# Patient Record
Sex: Male | Born: 1940
Health system: Southern US, Community
[De-identification: ages and names within clinical notes are randomized; demographics above are authoritative.]

## PROBLEM LIST (undated history)

## (undated) DIAGNOSIS — E079 Disorder of thyroid, unspecified: Secondary | ICD-10-CM

## (undated) DIAGNOSIS — Z974 Presence of external hearing-aid: Secondary | ICD-10-CM

## (undated) DIAGNOSIS — Z8709 Personal history of other diseases of the respiratory system: Secondary | ICD-10-CM

## (undated) DIAGNOSIS — E039 Hypothyroidism, unspecified: Secondary | ICD-10-CM

## (undated) DIAGNOSIS — I10 Essential (primary) hypertension: Secondary | ICD-10-CM

## (undated) DIAGNOSIS — I341 Nonrheumatic mitral (valve) prolapse: Secondary | ICD-10-CM

## (undated) DIAGNOSIS — J45909 Unspecified asthma, uncomplicated: Secondary | ICD-10-CM

## (undated) DIAGNOSIS — T7840XA Allergy, unspecified, initial encounter: Secondary | ICD-10-CM

## (undated) DIAGNOSIS — Z87438 Personal history of other diseases of male genital organs: Secondary | ICD-10-CM

## (undated) DIAGNOSIS — K432 Incisional hernia without obstruction or gangrene: Secondary | ICD-10-CM

## (undated) DIAGNOSIS — J449 Chronic obstructive pulmonary disease, unspecified: Secondary | ICD-10-CM

## (undated) DIAGNOSIS — H269 Unspecified cataract: Secondary | ICD-10-CM

## (undated) DIAGNOSIS — E785 Hyperlipidemia, unspecified: Secondary | ICD-10-CM

## (undated) DIAGNOSIS — K635 Polyp of colon: Secondary | ICD-10-CM

## (undated) DIAGNOSIS — R011 Cardiac murmur, unspecified: Secondary | ICD-10-CM

## (undated) DIAGNOSIS — I499 Cardiac arrhythmia, unspecified: Secondary | ICD-10-CM

## (undated) DIAGNOSIS — K5790 Diverticulosis of intestine, part unspecified, without perforation or abscess without bleeding: Secondary | ICD-10-CM

## (undated) DIAGNOSIS — K5720 Diverticulitis of large intestine with perforation and abscess without bleeding: Secondary | ICD-10-CM

## (undated) HISTORY — DX: Hyperlipidemia, unspecified: E78.5

## (undated) HISTORY — DX: Nonrheumatic mitral (valve) prolapse: I34.1

## (undated) HISTORY — DX: Chronic obstructive pulmonary disease, unspecified: J44.9

## (undated) HISTORY — DX: Unspecified cataract: H26.9

## (undated) HISTORY — DX: Unspecified asthma, uncomplicated: J45.909

## (undated) HISTORY — PX: COLONOSCOPY: SHX174

## (undated) HISTORY — DX: Incisional hernia without obstruction or gangrene: K43.2

## (undated) HISTORY — DX: Hemochromatosis, unspecified: E83.119

## (undated) HISTORY — DX: Personal history of other diseases of the respiratory system: Z87.09

## (undated) HISTORY — DX: Polyp of colon: K63.5

## (undated) HISTORY — DX: Disorder of thyroid, unspecified: E07.9

## (undated) HISTORY — DX: Allergy, unspecified, initial encounter: T78.40XA

## (undated) HISTORY — DX: Diverticulosis of intestine, part unspecified, without perforation or abscess without bleeding: K57.90

## (undated) HISTORY — DX: Personal history of other diseases of male genital organs: Z87.438

## (undated) HISTORY — PX: UPPER GI ENDOSCOPY: SHX6162

## (undated) HISTORY — DX: Diverticulitis of large intestine with perforation and abscess without bleeding: K57.20

---

## 1997-01-26 HISTORY — PX: INGUINAL HERNIA REPAIR: SHX194

## 2004-02-15 ENCOUNTER — Ambulatory Visit: Payer: Self-pay | Admitting: Hematology & Oncology

## 2004-08-26 ENCOUNTER — Ambulatory Visit: Payer: Self-pay | Admitting: Hematology & Oncology

## 2005-01-01 ENCOUNTER — Ambulatory Visit: Payer: Self-pay | Admitting: Hematology & Oncology

## 2005-07-14 ENCOUNTER — Ambulatory Visit: Payer: Self-pay | Admitting: Hematology & Oncology

## 2005-07-16 LAB — FERRITIN: Ferritin: 34 ng/mL (ref 22–322)

## 2006-01-11 ENCOUNTER — Ambulatory Visit: Payer: Self-pay | Admitting: Hematology & Oncology

## 2006-01-14 LAB — CBC WITH DIFFERENTIAL/PLATELET
Basophils Absolute: 0 10*3/uL (ref 0.0–0.1)
Eosinophils Absolute: 0.2 10*3/uL (ref 0.0–0.5)
HGB: 15.2 g/dL (ref 13.0–17.1)
MCV: 92.9 fL (ref 81.6–98.0)
MONO#: 0.8 10*3/uL (ref 0.1–0.9)
NEUT#: 3 10*3/uL (ref 1.5–6.5)
RDW: 16.2 % — ABNORMAL HIGH (ref 11.2–14.6)
WBC: 4.9 10*3/uL (ref 4.0–10.0)
lymph#: 1 10*3/uL (ref 0.9–3.3)

## 2006-07-12 ENCOUNTER — Ambulatory Visit: Payer: Self-pay | Admitting: Hematology & Oncology

## 2006-07-14 LAB — FERRITIN: Ferritin: 43 ng/mL (ref 22–322)

## 2006-10-13 ENCOUNTER — Ambulatory Visit: Payer: Self-pay | Admitting: Internal Medicine

## 2006-10-22 ENCOUNTER — Ambulatory Visit: Payer: Self-pay | Admitting: Internal Medicine

## 2006-10-22 ENCOUNTER — Encounter: Payer: Self-pay | Admitting: Internal Medicine

## 2007-01-10 ENCOUNTER — Ambulatory Visit: Payer: Self-pay | Admitting: Hematology & Oncology

## 2007-01-12 LAB — CBC WITH DIFFERENTIAL/PLATELET
BASO%: 0.5 % (ref 0.0–2.0)
EOS%: 2.3 % (ref 0.0–7.0)
MCH: 33.5 pg — ABNORMAL HIGH (ref 28.0–33.4)
MCHC: 35.4 g/dL (ref 32.0–35.9)
NEUT%: 57 % (ref 40.0–75.0)
RDW: 12.4 % (ref 11.2–14.6)
lymph#: 1.3 10*3/uL (ref 0.9–3.3)

## 2007-01-12 LAB — IRON AND TIBC: TIBC: 249 ug/dL (ref 215–435)

## 2007-07-11 ENCOUNTER — Ambulatory Visit: Payer: Self-pay | Admitting: Hematology & Oncology

## 2008-01-04 ENCOUNTER — Ambulatory Visit: Payer: Self-pay | Admitting: Hematology & Oncology

## 2008-01-11 LAB — COMPREHENSIVE METABOLIC PANEL
ALT: 19 U/L (ref 0–53)
Albumin: 4.4 g/dL (ref 3.5–5.2)
CO2: 26 mEq/L (ref 19–32)
Calcium: 9.6 mg/dL (ref 8.4–10.5)
Chloride: 104 mEq/L (ref 96–112)
Creatinine, Ser: 1 mg/dL (ref 0.40–1.50)
Sodium: 140 mEq/L (ref 135–145)
Total Protein: 7.7 g/dL (ref 6.0–8.3)

## 2008-01-11 LAB — CBC WITH DIFFERENTIAL (CANCER CENTER ONLY)
BASO%: 0.6 % (ref 0.0–2.0)
EOS%: 3.3 % (ref 0.0–7.0)
LYMPH%: 33.6 % (ref 14.0–48.0)
MCH: 32.7 pg (ref 28.0–33.4)
MCHC: 34 g/dL (ref 32.0–35.9)
MCV: 96 fL (ref 82–98)
MONO%: 7.4 % (ref 0.0–13.0)
Platelets: 252 10*3/uL (ref 145–400)
RDW: 11.3 % (ref 10.5–14.6)
WBC: 4 10*3/uL (ref 4.0–10.0)

## 2008-01-11 LAB — CHCC SATELLITE - SMEAR

## 2008-01-11 LAB — AFP TUMOR MARKER: AFP-Tumor Marker: 1.8 ng/mL (ref 0.0–8.0)

## 2008-01-27 DIAGNOSIS — K5720 Diverticulitis of large intestine with perforation and abscess without bleeding: Secondary | ICD-10-CM

## 2008-01-27 HISTORY — DX: Diverticulitis of large intestine with perforation and abscess without bleeding: K57.20

## 2008-01-27 HISTORY — PX: COLOSTOMY TAKEDOWN: SHX5783

## 2008-07-10 ENCOUNTER — Ambulatory Visit: Payer: Self-pay | Admitting: Hematology & Oncology

## 2008-07-11 LAB — CBC WITH DIFFERENTIAL (CANCER CENTER ONLY)
BASO#: 0 10*3/uL (ref 0.0–0.2)
Eosinophils Absolute: 0.1 10*3/uL (ref 0.0–0.5)
LYMPH%: 28.6 % (ref 14.0–48.0)
MCH: 27.1 pg — ABNORMAL LOW (ref 28.0–33.4)
MCV: 84 fL (ref 82–98)
MONO%: 7.5 % (ref 0.0–13.0)
NEUT%: 60.4 % (ref 40.0–80.0)
Platelets: 271 10*3/uL (ref 145–400)
RBC: 4.36 10*6/uL (ref 4.20–5.70)

## 2008-08-12 ENCOUNTER — Encounter (INDEPENDENT_AMBULATORY_CARE_PROVIDER_SITE_OTHER): Payer: Self-pay | Admitting: General Surgery

## 2008-08-12 ENCOUNTER — Inpatient Hospital Stay (HOSPITAL_COMMUNITY): Admission: EM | Admit: 2008-08-12 | Discharge: 2008-08-16 | Payer: Self-pay | Admitting: Orthopaedic Surgery

## 2008-08-12 HISTORY — PX: PARTIAL COLECTOMY: SHX5273

## 2008-08-18 ENCOUNTER — Inpatient Hospital Stay (HOSPITAL_COMMUNITY): Admission: EM | Admit: 2008-08-18 | Discharge: 2008-08-24 | Payer: Self-pay | Admitting: Emergency Medicine

## 2008-10-23 ENCOUNTER — Encounter (INDEPENDENT_AMBULATORY_CARE_PROVIDER_SITE_OTHER): Payer: Self-pay | Admitting: General Surgery

## 2008-10-23 ENCOUNTER — Inpatient Hospital Stay (HOSPITAL_COMMUNITY): Admission: RE | Admit: 2008-10-23 | Discharge: 2008-10-29 | Payer: Self-pay | Admitting: General Surgery

## 2009-01-26 HISTORY — PX: COLONOSCOPY W/ BIOPSIES: SHX1374

## 2009-07-08 ENCOUNTER — Ambulatory Visit: Payer: Self-pay | Admitting: Hematology & Oncology

## 2009-07-10 LAB — FERRITIN: Ferritin: 15 ng/mL — ABNORMAL LOW (ref 22–322)

## 2009-07-10 LAB — CBC WITH DIFFERENTIAL (CANCER CENTER ONLY)
BASO#: 0 10*3/uL (ref 0.0–0.2)
Eosinophils Absolute: 0.2 10*3/uL (ref 0.0–0.5)
HGB: 12.1 g/dL — ABNORMAL LOW (ref 13.0–17.1)
LYMPH#: 1.1 10*3/uL (ref 0.9–3.3)
MCH: 27.5 pg — ABNORMAL LOW (ref 28.0–33.4)
MONO%: 7.6 % (ref 0.0–13.0)
NEUT#: 3.1 10*3/uL (ref 1.5–6.5)
RBC: 4.41 10*6/uL (ref 4.20–5.70)

## 2009-09-20 ENCOUNTER — Encounter: Payer: Self-pay | Admitting: Internal Medicine

## 2009-10-30 ENCOUNTER — Encounter (INDEPENDENT_AMBULATORY_CARE_PROVIDER_SITE_OTHER): Payer: Self-pay | Admitting: *Deleted

## 2009-11-21 ENCOUNTER — Encounter (INDEPENDENT_AMBULATORY_CARE_PROVIDER_SITE_OTHER): Payer: Self-pay | Admitting: *Deleted

## 2009-11-25 ENCOUNTER — Ambulatory Visit: Payer: Self-pay | Admitting: Internal Medicine

## 2009-12-09 ENCOUNTER — Ambulatory Visit: Payer: Self-pay | Admitting: Internal Medicine

## 2009-12-11 ENCOUNTER — Encounter: Payer: Self-pay | Admitting: Internal Medicine

## 2010-02-16 ENCOUNTER — Encounter: Payer: Self-pay | Admitting: Endocrinology

## 2010-02-27 NOTE — Letter (Signed)
Summary: Colonoscopy Letter  [facility]  [address] [place], [place] [address]   Phone: [phone]  Fax: [phone]      September 20, 2009 MRN: [id number]   [facility] [address] [place], [place]  [address]   Dear Mr. Nery,   According to your medical record, it is time for you to schedule a Colonoscopy. The American Cancer Society recommends this procedure as a method to detect early colon cancer. Patients with a family history of colon cancer, or a personal history of colon polyps or inflammatory bowel disease are at increased risk.  This letter has been generated based on the recommendations made at the time of your procedure. If you feel that in your particular situation this may no longer apply, please contact our office.  Please call our office at [phone] to schedule this appointment or to update your records at your earliest convenience.  Thank you for cooperating with [place] to provide you with the very best care possible.   Sincerely,  Wilhemina Bonito. Marina Goodell, M.D.  [facility] Gastroenterology Division [phone]

## 2010-02-27 NOTE — Letter (Signed)
Summary: Patient Notice- Colon Biospy Results   Gastroenterology  [address] [place], [place] [address]   Phone: [phone]  Fax: [phone]        December 11, 2009 MRN: [id number]    [facility] [address] [place], [place]  [address]    Dear Mr. Bredeson,  I am pleased to inform you that the biopsies taken during your recent colonoscopy showed only benign inflammation near your anastomosis (as expected).  Additional information/recommendations:  __No further action is needed at this time.  Please follow-up with      your primary care physician for your other healthcare needs.   __You should have a repeat colonoscopy examination  in 5 years.    Please call us if you are having persistent problems or have questions about your condition that have not been fully answered at this time.  Sincerely,  Hilarie Fredrickson MD   This letter has been electronically signed by your physician.  Appended Document: Patient Notice- Colon Biospy Results Letter mailed

## 2010-02-27 NOTE — Procedures (Signed)
Summary: Colonoscopy  Patient: Preston Reeves Note: All result statuses are Final unless otherwise noted.  Tests: (1) Colonoscopy (COL)   COL Colonoscopy           DONE      [facility]     [address]     [place], [place]  [address]           COLONOSCOPY PROCEDURE REPORT           PATIENT:  Preston Reeves, Preston Reeves  MR#:  [id number]     BIRTHDATE:  [birth year], 69 yrs. old  GENDER:  male     ENDOSCOPIST:  Wilhemina Bonito. Eda Keys, MD     REF. BY:  Surveillance Program Recall,     PROCEDURE DATE:  12/09/2009     PROCEDURE:  Colonoscopy with biopsies     ASA CLASS:  Class II     INDICATIONS:  history of pre-cancerous (adenomatous) colon polyps,     family history of colon cancer (parent 66), surveillance and     high-risk screening ; index exam 2003 w/ f/u 2005 and 2008;     segmental colonic resection for complicated diverticular disease     since last exam     MEDICATIONS:   Fentanyl 50 mcg IV, Versed 8 mg IV           DESCRIPTION OF PROCEDURE:   After the risks benefits and     alternatives of the procedure were thoroughly explained, informed     consent was obtained.  Digital rectal exam was performed and     revealed no abnormalities.   The [id number] endoscope was     introduced through the anus and advanced to the cecum, which was     identified by both the appendix and ileocecal valve, without     limitations.Time to cecum = 3:42 min.  The quality of the prep was     excellent, using MoviPrep.  The instrument was then slowly     withdrawn (time = 12:46 min) as the colon was fully examined.     <<PROCEDUREIMAGES>>           FINDINGS:  There was evidence of a prior segmental colectomy in     the sigmoid colon.  There was a surgical anastomosis in the     sigmoid colon at 20cm. One area had nospecific tissue irregularity     (see image #4 above) which was biopsied to r/o neoplastic changes.     This was otherwise a normal examination of the colon.  No obvious     polyps or  cancers were seen.   Retroflexed views in the rectum     revealed no abnormalities.    The scope was then withdrawn from     the patient and the procedure completed.           COMPLICATIONS:  None           ENDOSCOPIC IMPRESSION:     1) Prior segmental colectomy in the sigmoid colon     2) Anastomosis in the sigmoid colon with mild tissue irreglarity     s/p bx     3) Otherwise normal examination of the colon     4) No obvious polyps or cancers           RECOMMENDATIONS:     1) Await biopsy results     2) Follow up colonoscopy in 5 years     ______________________________  Wilhemina Bonito. Eda Keys, MD           CC:  Cassell Clement, MD; The Patient           n.     eSIGNED:   Wilhemina Bonito. Eda Keys at 12/09/2009 03:21 PM           [name], [id number]  Note: An exclamation mark (!) indicates a result that was not dispersed into the flowsheet. Document Creation Date: 12/09/2009 3:22 PM _______________________________________________________________________  (1) Order result status: Final Collection or observation date-time: 12/09/2009 15:11 Requested date-time:  Receipt date-time:  Reported date-time:  Referring Physician:   Ordering Physician: Fransico Setters [phone]) Specimen Source:  Source: Launa Grill Order Number: [id number] Lab site:   Appended Document: Colonoscopy recall

## 2010-02-27 NOTE — Letter (Signed)
Summary: [facility] Instructions  [facility]  [address] [place], [place] [address]   Phone: [phone]  Fax: [phone]       Preston Reeves    [birth year]    MRN: [id number]        Procedure Day /Date:  Monday 12/09/2009     Arrival Time:  1:30 pm     Procedure Time:  2:30 pm     Location of Procedure:                    _x _  [facility] (4th Floor)                        PREPARATION FOR COLONOSCOPY WITH MOVIPREP   Starting 5 days prior to your procedure Wednesday 11/9 do not eat nuts, seeds, popcorn, corn, beans, peas,  salads, or any raw vegetables.  Do not take any fiber supplements (e.g. Metamucil, Citrucel, and Benefiber).  THE DAY BEFORE YOUR PROCEDURE         DATE: Sunday 11/13  1.  Drink clear liquids the entire day-NO SOLID FOOD  2.  Do not drink anything colored red or purple.  Avoid juices with pulp.  No orange juice.  3.  Drink at least 64 oz. (8 glasses) of fluid/clear liquids during the day to prevent dehydration and help the prep work efficiently.  CLEAR LIQUIDS INCLUDE: Water Jello Ice Popsicles Tea (sugar ok, no milk/cream) Powdered fruit flavored drinks Coffee (sugar ok, no milk/cream) Gatorade Juice: apple, white grape, white cranberry  Lemonade Clear bullion, consomm, broth Carbonated beverages (any kind) Strained chicken noodle soup Hard Candy                             4.  In the morning, mix first dose of MoviPrep solution:    Empty 1 Pouch A and 1 Pouch B into the disposable container    Add lukewarm drinking water to the top line of the container. Mix to dissolve    Refrigerate (mixed solution should be used within 24 hrs)  5.  Begin drinking the prep at 5:00 p.m. The MoviPrep container is divided by 4 marks.   Every 15 minutes drink the solution down to the next mark (approximately 8 oz) until the full liter is complete.   6.  Follow completed prep with 16 oz of clear liquid of your choice  (Nothing red or purple).  Continue to drink clear liquids until bedtime.  7.  Before going to bed, mix second dose of MoviPrep solution:    Empty 1 Pouch A and 1 Pouch B into the disposable container    Add lukewarm drinking water to the top line of the container. Mix to dissolve    Refrigerate  THE DAY OF YOUR PROCEDURE      DATE: Monday 11/14  Beginning at 9:30 a.m. (5 hours before procedure):         1. Every 15 minutes, drink the solution down to the next mark (approx 8 oz) until the full liter is complete.  2. Follow completed prep with 16 oz. of clear liquid of your choice.    3. You may drink clear liquids until 12:30 pm (2 HOURS BEFORE PROCEDURE).   MEDICATION INSTRUCTIONS  Unless otherwise instructed, you should take regular prescription medications with a small sip of water   as early as possible the morning  of your procedure.          OTHER INSTRUCTIONS  You will need a responsible adult at least 70 years of age to accompany you and drive you home.   This person must remain in the waiting room during your procedure.  Wear loose fitting clothing that is easily removed.  Leave jewelry and other valuables at home.  However, you may wish to bring a book to read or  an iPod/MP3 player to listen to music as you wait for your procedure to start.  Remove all body piercing jewelry and leave at home.  Total time from sign-in until discharge is approximately 2-3 hours.  You should go home directly after your procedure and rest.  You can resume normal activities the  day after your procedure.  The day of your procedure you should not:   Drive   Make legal decisions   Operate machinery   Drink alcohol   Return to work  You will receive specific instructions about eating, activities and medications before you leave.    The above instructions have been reviewed and explained to me by   Ezra Sites RN  November 25, 2009 11:13 AM  I fully understand and can  verbalize these instructions _____________________________ Date _________

## 2010-02-27 NOTE — Letter (Signed)
Summary: Colonoscopy Letter  [facility]  [address]   [place], [place] [address]   Phone: [phone]  Fax: [phone]      September 20, 2009 MRN: [id number]   [facility] [address] [place], [place]  [address]   Dear Mr. Covington,   According to your medical record, it is time for you to schedule a Colonoscopy. The American Cancer Society recommends this procedure as a method to detect early colon cancer. Patients with a family history of colon cancer, or a personal history of colon polyps or inflammatory bowel disease are at increased risk.  This letter has been generated based on the recommendations made at the time of your procedure. If you feel that in your particular situation this may no longer apply, please contact our office.  Please call our office at [phone] to schedule this appointment or to update your records at your earliest convenience.  Thank you for cooperating with us to provide you with the very best care possible.   Sincerely,  John N. Perry, M.D.  [facility] Gastroenterology Division [phone] 

## 2010-02-27 NOTE — Miscellaneous (Signed)
Summary: [facility]  Clinical Lists Changes  Medications: Added new medication of MOVIPREP 100 GM  SOLR (PEG-KCL-NACL-NASULF-NA ASC-C) As per prep instructions. - Signed Rx of MOVIPREP 100 GM  SOLR (PEG-KCL-NACL-NASULF-NA ASC-C) As per prep instructions.;  #1 x 0;  Signed;  Entered by: Ezra Sites RN;  Authorized by: Hilarie Fredrickson MD;  Method used: Electronically to [facility]. [phone]*, [address], [place], [place]  [address], Ph: [phone], Fax: [phone] Observations: Added new observation of NKA: T (11/25/2009 10:52)    Prescriptions: MOVIPREP 100 GM  SOLR (PEG-KCL-NACL-NASULF-NA ASC-C) As per prep instructions.  #1 x 0   Entered by:   Ezra Sites RN   Authorized by:   Hilarie Fredrickson MD   Signed by:   Ezra Sites RN on 11/25/2009   Method used:   Electronically to        [facility] [address] [phone]* (retail)       [address] [place], [place]  [address]       Ph: [phone]       Fax: [phone]   RxID:   [id number]

## 2010-02-27 NOTE — Letter (Signed)
Summary: Pre Visit Letter Revised  [facility]  [address] [place], [place] [address]   Phone: [phone]  Fax: [phone]        10/30/2009 MRN: [id number] [facility] [address] [place], [place]  [address]             Procedure Date:  12/09/2009   Welcome to the Gastroenterology Division at [facility].    You are scheduled to see a nurse for your pre-procedure visit on 11/25/2009 at 11:00am on the 3rd floor at [facility], [address]  We ask that you try to arrive at our office 15 minutes prior to your appointment time to allow for check-in.  Please take a minute to review the attached form.  If you answer "Yes" to one or more of the questions on the first page, we ask that you call the person listed at your earliest opportunity.  If you answer "No" to all of the questions, please complete the rest of the form and bring it to your appointment.    Your nurse visit will consist of discussing your medical and surgical history, your immediate family medical history, and your medications.   If you are unable to list all of your medications on the form, please bring the medication bottles to your appointment and we will list them.  We will need to be aware of both prescribed and over the counter drugs.  We will need to know exact dosage information as well.    Please be prepared to read and sign documents such as consent forms, a financial agreement, and acknowledgement forms.  If necessary, and with your consent, a friend or relative is welcome to sit-in on the nurse visit with you.  Please bring your insurance card so that we may make a copy of it.  If your insurance requires a referral to see a specialist, please bring your referral form from your primary care physician.  No co-pay is required for this nurse visit.     If you cannot keep your appointment, please call [phone] to cancel or reschedule prior to your appointment date.  This  allows [place] the opportunity to schedule an appointment for another patient in need of care.    Thank you for choosing [facility] for your medical needs.  We appreciate the opportunity to care for you.  Please visit [place] at our website  to learn more about our practice.  Sincerely, The Gastroenterology Division

## 2010-05-01 LAB — CBC
HCT: 29.1 % — ABNORMAL LOW (ref 39.0–52.0)
Hemoglobin: 9.9 g/dL — ABNORMAL LOW (ref 13.0–17.0)
MCV: 84.9 fL (ref 78.0–100.0)
RBC: 3.42 MIL/uL — ABNORMAL LOW (ref 4.22–5.81)
WBC: 4.9 10*3/uL (ref 4.0–10.5)

## 2010-05-01 LAB — BASIC METABOLIC PANEL
Chloride: 102 mEq/L (ref 96–112)
GFR calc Af Amer: >60 mL/min (ref >60)
Potassium: 3.8 mEq/L (ref 3.5–5.1)

## 2010-05-02 LAB — TYPE AND SCREEN: Antibody Screen: NEGATIVE

## 2010-05-02 LAB — BASIC METABOLIC PANEL
BUN: 15 mg/dL (ref 6–23)
CO2: 29 mEq/L (ref 19–32)
Calcium: 8.8 mg/dL (ref 8.4–10.5)
Chloride: 103 mEq/L (ref 96–112)
Chloride: 105 mEq/L (ref 96–112)
GFR calc Af Amer: >60 mL/min (ref >60)
GFR calc Af Amer: >60 mL/min (ref >60)
GFR calc non Af Amer: >60 mL/min (ref >60)
Glucose, Bld: 105 mg/dL — ABNORMAL HIGH (ref 70–99)
Glucose, Bld: 117 mg/dL — ABNORMAL HIGH (ref 70–99)
Potassium: 3.8 mEq/L (ref 3.5–5.1)
Potassium: 4 mEq/L (ref 3.5–5.1)
Potassium: 4.1 mEq/L (ref 3.5–5.1)
Sodium: 135 mEq/L (ref 135–145)
Sodium: 136 mEq/L (ref 135–145)

## 2010-05-02 LAB — DIFFERENTIAL
Eosinophils Absolute: 0.2 10*3/uL (ref 0.0–0.7)
Eosinophils Relative: 4 % (ref 0–5)
Lymphs Abs: 1.1 10*3/uL (ref 0.7–4.0)
Monocytes Absolute: 0.6 10*3/uL (ref 0.1–1.0)

## 2010-05-02 LAB — URINALYSIS, ROUTINE W REFLEX MICROSCOPIC
Bilirubin Urine: NEGATIVE
Glucose, UA: NEGATIVE mg/dL
Ketones, ur: NEGATIVE mg/dL
pH: 5.5 (ref 5.0–8.0)

## 2010-05-02 LAB — PHOSPHORUS: Phosphorus: 4 mg/dL (ref 2.3–4.6)

## 2010-05-02 LAB — CBC
HCT: 31.8 % — ABNORMAL LOW (ref 39.0–52.0)
HCT: 34.9 % — ABNORMAL LOW (ref 39.0–52.0)
Hemoglobin: 10.7 g/dL — ABNORMAL LOW (ref 13.0–17.0)
Hemoglobin: 11.5 g/dL — ABNORMAL LOW (ref 13.0–17.0)
Hemoglobin: 9.7 g/dL — ABNORMAL LOW (ref 13.0–17.0)
MCHC: 33.9 g/dL (ref 30.0–36.0)
MCV: 83.7 fL (ref 78.0–100.0)
MCV: 85 fL (ref 78.0–100.0)
Platelets: 253 10*3/uL (ref 150–400)
RBC: 3.36 MIL/uL — ABNORMAL LOW (ref 4.22–5.81)
RBC: 3.76 MIL/uL — ABNORMAL LOW (ref 4.22–5.81)
WBC: 4.7 10*3/uL (ref 4.0–10.5)
WBC: 5.6 10*3/uL (ref 4.0–10.5)
WBC: 8.1 10*3/uL (ref 4.0–10.5)

## 2010-05-02 LAB — PROTIME-INR: Prothrombin Time: 13.6 seconds (ref 11.6–15.2)

## 2010-05-04 LAB — BASIC METABOLIC PANEL
BUN: 11 mg/dL (ref 6–23)
CO2: 25 mEq/L (ref 19–32)
CO2: 28 mEq/L (ref 19–32)
CO2: 28 mEq/L (ref 19–32)
CO2: 31 mEq/L (ref 19–32)
Calcium: 8 mg/dL — ABNORMAL LOW (ref 8.4–10.5)
Calcium: 8.2 mg/dL — ABNORMAL LOW (ref 8.4–10.5)
Chloride: 101 mEq/L (ref 96–112)
Chloride: 104 mEq/L (ref 96–112)
Chloride: 106 mEq/L (ref 96–112)
Chloride: 99 mEq/L (ref 96–112)
Creatinine, Ser: 1.01 mg/dL (ref 0.4–1.5)
GFR calc Af Amer: >60 mL/min (ref >60)
GFR calc Af Amer: >60 mL/min (ref >60)
GFR calc Af Amer: >60 mL/min (ref >60)
Glucose, Bld: 106 mg/dL — ABNORMAL HIGH (ref 70–99)
Glucose, Bld: 119 mg/dL — ABNORMAL HIGH (ref 70–99)
Potassium: 3.5 mEq/L (ref 3.5–5.1)
Potassium: 3.6 mEq/L (ref 3.5–5.1)
Potassium: 3.9 mEq/L (ref 3.5–5.1)
Sodium: 133 mEq/L — ABNORMAL LOW (ref 135–145)
Sodium: 134 mEq/L — ABNORMAL LOW (ref 135–145)
Sodium: 136 mEq/L (ref 135–145)

## 2010-05-04 LAB — DIFFERENTIAL
Basophils Absolute: 0 10*3/uL (ref 0.0–0.1)
Basophils Absolute: 0 10*3/uL (ref 0.0–0.1)
Eosinophils Absolute: 0 10*3/uL (ref 0.0–0.7)
Eosinophils Relative: 0 % (ref 0–5)
Eosinophils Relative: 0 % (ref 0–5)
Lymphocytes Relative: 2 % — ABNORMAL LOW (ref 12–46)
Lymphocytes Relative: 4 % — ABNORMAL LOW (ref 12–46)
Lymphs Abs: 0.4 10*3/uL — ABNORMAL LOW (ref 0.7–4.0)
Neutro Abs: 7.7 10*3/uL (ref 1.7–7.7)
Neutrophils Relative %: 90 % — ABNORMAL HIGH (ref 43–77)
Neutrophils Relative %: 95 % — ABNORMAL HIGH (ref 43–77)

## 2010-05-04 LAB — CBC
HCT: 28.5 % — ABNORMAL LOW (ref 39.0–52.0)
HCT: 32.3 % — ABNORMAL LOW (ref 39.0–52.0)
HCT: 33.5 % — ABNORMAL LOW (ref 39.0–52.0)
HCT: 35.4 % — ABNORMAL LOW (ref 39.0–52.0)
Hemoglobin: 10.1 g/dL — ABNORMAL LOW (ref 13.0–17.0)
Hemoglobin: 10.3 g/dL — ABNORMAL LOW (ref 13.0–17.0)
Hemoglobin: 10.5 g/dL — ABNORMAL LOW (ref 13.0–17.0)
Hemoglobin: 11.6 g/dL — ABNORMAL LOW (ref 13.0–17.0)
Hemoglobin: 9.5 g/dL — ABNORMAL LOW (ref 13.0–17.0)
MCHC: 32.5 g/dL (ref 30.0–36.0)
MCHC: 32.7 g/dL (ref 30.0–36.0)
MCHC: 32.8 g/dL (ref 30.0–36.0)
MCHC: 33 g/dL (ref 30.0–36.0)
MCHC: 33 g/dL (ref 30.0–36.0)
MCV: 81.9 fL (ref 78.0–100.0)
MCV: 82.1 fL (ref 78.0–100.0)
MCV: 82.4 fL (ref 78.0–100.0)
MCV: 82.8 fL (ref 78.0–100.0)
RBC: 3.44 MIL/uL — ABNORMAL LOW (ref 4.22–5.81)
RBC: 3.68 MIL/uL — ABNORMAL LOW (ref 4.22–5.81)
RBC: 3.78 MIL/uL — ABNORMAL LOW (ref 4.22–5.81)
RBC: 3.92 MIL/uL — ABNORMAL LOW (ref 4.22–5.81)
RBC: 4.09 MIL/uL — ABNORMAL LOW (ref 4.22–5.81)
RBC: 4.28 MIL/uL (ref 4.22–5.81)
RDW: 17.2 % — ABNORMAL HIGH (ref 11.5–15.5)
RDW: 17.3 % — ABNORMAL HIGH (ref 11.5–15.5)
WBC: 7.8 10*3/uL (ref 4.0–10.5)
WBC: 8.2 10*3/uL (ref 4.0–10.5)

## 2010-05-04 LAB — LIPASE, BLOOD: Lipase: 17 U/L (ref 11–59)

## 2010-05-04 LAB — CROSSMATCH: Antibody Screen: NEGATIVE

## 2010-05-04 LAB — URINALYSIS, ROUTINE W REFLEX MICROSCOPIC
Ketones, ur: >80 mg/dL — AB
Leukocytes, UA: NEGATIVE
Nitrite: NEGATIVE
Nitrite: NEGATIVE
Protein, ur: NEGATIVE mg/dL
Specific Gravity, Urine: 1.026 (ref 1.005–1.030)
Urobilinogen, UA: 0.2 mg/dL (ref 0.0–1.0)
Urobilinogen, UA: 0.2 mg/dL (ref 0.0–1.0)
pH: 5.5 (ref 5.0–8.0)

## 2010-05-04 LAB — COMPREHENSIVE METABOLIC PANEL
ALT: 19 U/L (ref 0–53)
AST: 62 U/L — ABNORMAL HIGH (ref 0–37)
Alkaline Phosphatase: 50 U/L (ref 39–117)
BUN: 13 mg/dL (ref 6–23)
BUN: 21 mg/dL (ref 6–23)
CO2: 23 mEq/L (ref 19–32)
CO2: 26 mEq/L (ref 19–32)
Calcium: 8.4 mg/dL (ref 8.4–10.5)
Calcium: 9 mg/dL (ref 8.4–10.5)
Creatinine, Ser: 0.84 mg/dL (ref 0.4–1.5)
GFR calc Af Amer: >60 mL/min (ref >60)
GFR calc non Af Amer: >60 mL/min (ref >60)
GFR calc non Af Amer: >60 mL/min (ref >60)
Glucose, Bld: 131 mg/dL — ABNORMAL HIGH (ref 70–99)
Potassium: 3.6 mEq/L (ref 3.5–5.1)
Sodium: 136 mEq/L (ref 135–145)
Total Bilirubin: 0.7 mg/dL (ref 0.3–1.2)
Total Protein: 7.3 g/dL (ref 6.0–8.3)

## 2010-05-04 LAB — ANAEROBIC CULTURE

## 2010-05-04 LAB — MAGNESIUM: Magnesium: 2.3 mg/dL (ref 1.5–2.5)

## 2010-05-04 LAB — URINE MICROSCOPIC-ADD ON

## 2010-05-04 LAB — PHOSPHORUS
Phosphorus: 1.7 mg/dL — ABNORMAL LOW (ref 2.3–4.6)
Phosphorus: 2.5 mg/dL (ref 2.3–4.6)
Phosphorus: 2.8 mg/dL (ref 2.3–4.6)

## 2010-05-04 LAB — WOUND CULTURE

## 2010-05-13 ENCOUNTER — Other Ambulatory Visit: Payer: Self-pay | Admitting: *Deleted

## 2010-05-14 ENCOUNTER — Ambulatory Visit: Payer: Self-pay | Admitting: Cardiology

## 2010-05-26 ENCOUNTER — Other Ambulatory Visit: Payer: Self-pay | Admitting: *Deleted

## 2010-05-26 DIAGNOSIS — E7800 Pure hypercholesterolemia, unspecified: Secondary | ICD-10-CM

## 2010-05-26 DIAGNOSIS — Z125 Encounter for screening for malignant neoplasm of prostate: Secondary | ICD-10-CM

## 2010-05-26 DIAGNOSIS — Z79899 Other long term (current) drug therapy: Secondary | ICD-10-CM

## 2010-06-10 NOTE — Discharge Summary (Signed)
NAME:  Preston Reeves, Preston Reeves                ACCOUNT NO.:  [id number]   MEDICAL RECORD NO.:  [id number]          PATIENT TYPE:  INP   LOCATION:  1533                         FACILITY:  [facility]   PHYSICIAN:  Almond Lint, MD       DATE OF BIRTH:  [birth year]   DATE OF ADMISSION:  08/18/2008  DATE OF DISCHARGE:  08/24/2008                               DISCHARGE SUMMARY   FINAL DIAGNOSIS:  Diverticulitis.   SECONDARY DIAGNOSIS:  Wound infection and prolonged postoperative ileus.   PRINCIPAL PROCEDURES:  Opening of abdominal wound and CT scan consistent  with obstruction.   DISCHARGE MEDICATIONS:  Percocet 5/325 one to two p.o. q.4 h p.r.n.  pain, Claritin, multivitamin. 81 mg aspirin, B2, fish oil, vitamin C,  use Levoxyl 0.137 mcg a day, and I will be calling in him a prescription  for doxazosin for urinary retention.   HOSPITAL COURSE:  Mr. Goeden was readmitted to the hospital after being  discharged following a Hartmann's procedure for perforated  diverticulitis.  He had a wound infection, and his abdominal wound was  opened by Dr. Dwain Sarna.  He was going to undergo a CT scan in the  emergency department on the day of admission. However, he was unable to  tolerate contrast.  He required NG tube decompression for around 3-4  days.  His NG tube stopped putting out much, and he was placed to  gravity.  He continued, however, to have minimal output from his ostomy,  so a CT scan was performed.  This was suspicious for a partial  obstruction. However, the next morning he had opened up and blew out  ostomy bag because of the volume of stool and air that came through.  His diet was advanced and he did well.  He had been discharged on the  previous admission with a Foley catheter due to urinary retention.  However, this was removed during this hospitalization and he is able to  void without significant postvoid residuals.  He will go home in  improved condition.  He will have a wet-to-dry daily  and ostomy care  with home health nurse checking in on these.  Likely his wife will be  able to do these on her own and just need a little assistance.  Additionally he will follow up with me in 2 weeks.  There are no  significant labs from his hospitalization.      Almond Lint, MD  Electronically Signed     FB/MEDQ  D:  08/24/2008  T:  08/24/2008  Job:  [id number]

## 2010-06-10 NOTE — H&P (Signed)
NAME:  Preston Reeves, Preston Reeves                ACCOUNT NO.:  [id number]   MEDICAL RECORD NO.:  [id number]          PATIENT TYPE:  INP   LOCATION:  1501                         [facility]ACILITY[facility]   PHYSICIAN:  Almond Lint, MD       DATE OF BIRTH:  30-Oct-1940   DATE OF ADMISSION:  08/11/2008  DATE OF DISCHARGE:                              HISTORY & PHYSICAL   CHIEF COMPLAINT:  Abdominal pain.   HISTORY OF PRESENT ILLNESS:  Mr. Frangos is a 70 year old male who has  been having 12-24 hours of lower abdominal pain.  He originally thought  that this was an episode of gas or constipation and tried antacids and  Prilosec.  He had no relief of his symptoms.  They started to get worse.  He describes this as lower abdominal pain, but kind of came on and would  not let go as he described it.  He has had emesis, has been unable to  keep anything down.  He also developed fevers and chills.  He sought  help from the emergency department when I started to hurt even to  move.  He denies any radiation of the pain anywhere else.  The pain was  worse on the drive over here.  He has never had symptoms like this  before.  He denies diarrhea but has had a little bit of low grade  constipation.  He was able to play golf 2 days prior to admission and  shoot an 80.   PAST MEDICAL HISTORY:  Significant for hemochromatosis diagnosed with  blood work for an insurance physical, hypothyroidism and mitral valve  prolapse.  He has had a history of a left inguinal hernia repair.   FAMILY HISTORY:  Father with colon cancer.   SOCIAL HISTORY:  He is accompanied by his son and wife.  He does  occasionally have alcohol, does not use any other substances.   ALLERGIES:  None.   MEDICATIONS:  1. 162 mg of aspirin per day.  2. Calcium.  3. Claritin.  4. Levoxyl.  5. Multivitamin.  6. Vitamin C.  7. Fish Oil.  8. Vitamin B12.   REVIEW OF SYSTEMS:  Is otherwise negative 10-point review of systems as  in the HPI and the past  medical history.   PHYSICAL EXAMINATION:  Temperature 101.2, pulse 91, respiratory rate 20,  blood pressure 101/59.  GENERAL:  He is alert and oriented x3, no acute distress.  HEENT:  Normocephalic, atraumatic.  Sclerae are anicteric.  NECK:  Supple without lymphadenopathy.  HEART:  Regular.  LUNGS:  Clear.  ABDOMEN:  Soft, slightly distended with bilateral lower quadrant  tenderness worse in the right.  EXTREMITIES:  Warm and well perfused with no pitting edema.  SKIN:  Without rashes.   LABS AND X-RAYS:  Images and reports are reviewed.  He has a plain film  with nonspecific bowel gas pattern and CT scan demonstrating  inflammation in the right lower quadrant and free air diffusely in the  abdomen.  UA is positive for trace ketones.  Chemistries:  Potassium  3.6, creatinine 1.19,  glucose 131, bilirubin 7.3, his remainder of LFTs  are normal.  White count 13.4, hematocrit 35.4, platelet count 232,000.   IMPRESSION:  This is a 70 year old male with a perforated viscus.  We  will give him intravenous fluids, intravenous antibiotics, will do an  electrocardiogram, diabetes screen, coagulations and take him to the  operating room for exploration.  I will start with a diagnostic  laparoscopy as this may be a simple as his appendix. .  Otherwise, we  will also do an exploratory laparotomy and resect/repair what is  perforated.  We discussed the possibility of this being inflammatory  bowel disease, cancer, appendicitis, diverticulitis.  We discussed the  risks and benefits of surgery including bleeding, infection, need for a  blood transfusion, possible ostomy, and possible anastomotic leak.  He  understands and wishes to proceed.      Almond Lint, MD  Electronically Signed     FB/MEDQ  D:  08/12/2008  T:  08/12/2008  Job:  [id number]

## 2010-06-10 NOTE — H&P (Signed)
NAME:  Preston Reeves, Preston Reeves NO.:  [id number]   MEDICAL RECORD NO.:  [id number]          PATIENT TYPE:  INP   LOCATION:  1533                         [facility]   PHYSICIAN:  Juanetta Gosling, MDDATE OF BIRTH:  Jun 09, 1940   DATE OF ADMISSION:  08/18/2008  DATE OF DISCHARGE:                              HISTORY & PHYSICAL   CHIEF COMPLAINT:  Abdominal pain, nausea, and vomiting status post a  sigmoid colectomy.   HISTORY OF PRESENT ILLNESS:  This is 70 year old male, who presented on  July 18th and underwent a Hartmann's procedure for perforated  diverticulitis by Dr. Almond Lint.  He was discharged on July 22nd with  gas and stool coming out of his ostomy and tolerating some of his diet.  He has noticed today decreased output from his ostomy in terms of  flatus, and increasing abdominal pain with drainage from the dependent  portion of his wound as well as some redness.  He also had an episode of  emesis earlier today as well as another one when he was undergoing a CT  scan.   PAST MEDICAL HISTORY:  Significant for:  1. Hemochromatosis.  2. Hypothyroidism.  3. Mitral valve prolapse.   PAST SURGICAL HISTORY:  1. Left inguinal repair.  2. A Hartmann's procedure.   FAMILY HISTORY:  Positive for colon cancer.   SOCIAL HISTORY:  Uses occasional alcohol and is a nonsmoker, lives at  home with his wife in [place].   He has no known drug allergies.   MEDICATIONS:  Aspirin 162 mg per day, Claritin, multivitamin, vitamin C,  fish oil, vitamin B12, Levoxyl as well as Percocet.   REVIEW OF SYSTEMS:  Otherwise negative.   PHYSICAL EXAMINATION:  VITAL SIGNS:  Temperature of 98.2, 88, 19,  143/84, 93%.  GENERAL:.  He is a well-appearing male in no distress.  NECK:  Supple without any adenopathy.  LUNGS:  Clear bilaterally.  HEART:  Regular rate and rhythm.  ABDOMEN:  He has a colostomy that is pink with stool in the bag, but not  a lot of air.  His  midline wound has erythema around it as well as  drainage of some purulent fluid inferiorly.  EXTREMITIES:  No edema.   LABORATORY EVALUATIONS:  Urinalysis shows no infection.  Sodium 135,  potassium 3.4, chloride is 100, CO2 26, his BUN is 13, creatinine 0.84,  glucose 140.  His liver function tests are within normal limits.  His  white blood cell count is 8.6 with a left shift with 90 segs.  Hematocrit is 37.5.  His platelets are 297.  Acute abdominal series  shows dilated small bowel and some right lower lobe atelectasis.  A CT  scan shows a dilated small bowel.   IMPRESSION:  1. Superficial wound infection.  2. Ileus.   PLAN:  I opened his wound in the emergency room and there was return of  a fair amount of purulent fluid.  The base of this appeared that he did  not have a dehiscence, that is, the PDS suture was intact and we will  begin b.i.d. dressing changes of this.  I will also be getting him some  antibiotics for the erythema.  He is going to remain NPO.  I told him  right now that we would not place an NG tube as he appears to have just  an ileus.  If he continues to have any further nausea or vomiting, we  plan on nasogastric tube placement.  I also notified Dr. Donell Beers of the  patient's admission.      Juanetta Gosling, MD  Electronically Signed     MCW/MEDQ  D:  08/18/2008  T:  08/18/2008  Job:  [id number]

## 2010-06-10 NOTE — Op Note (Signed)
NAME:  Preston Reeves, Preston Reeves                ACCOUNT NO.:  [id number]   MEDICAL RECORD NO.:  [id number]          PATIENT TYPE:  INP   LOCATION:  1501                         FACILITY:  [facility]   PHYSICIAN:  Almond Lint, MD       DATE OF BIRTH:  09/28/1940   DATE OF PROCEDURE:  08/12/2008  DATE OF DISCHARGE:                               OPERATIVE REPORT   PREOPERATIVE DIAGNOSIS:  Perforated viscus.   POSTOPERATIVE DIAGNOSIS:  Perforated sigmoid diverticulitis.   PROCEDURE:  Diagnostic laparoscopy and Hartmann's procedure.   SURGEON:  Marilynne Halsted, M.D.   ASSISTANT:  Anselm Pancoast. Zachery Dakins, M.D.   ANESTHESIA:  General and local.   FINDINGS:  Normal appendix, perforated sigmoid colon.   SPECIMEN:  Sigmoid colon to Pathology.   ESTIMATED BLOOD LOSS:  50 mL.   COMPLICATIONS:  None.   DESCRIPTION OF PROCEDURE:  [name] was identified in holding area and  taken to the operating room where he was placed supine on the operating  table.  General anesthesia was induced.  The patient's left arm was  tucked.  His abdomen was clipped and a Foley catheter was placed.  The  abdomen was then prepped and draped in a sterile fashion.  A time-out  was performed according to the surgical safety check list.  When all was  correct, we continued.  The infraumbilical skin was anesthetized with 1%  lidocaine plain and 0.25% Marcaine with epinephrine.  A vertical  incision was made approximately 1.5 cm in length.  The subcutaneous  tissue was divided bluntly with a Kelly clamp.  The umbilical stalk was  elevated with a Kocher and the fascia was entered sharply in the  midline.  The Tresa Endo was used to confirm entrance into the peritoneal  cavity.  A purse string suture of 0-Vicryl was placed around the fascial  incision.  The [facility] trocar was introduced into the abdomen and  pneumoperitoneum was achieved to a pressure of 15 mmHg.  The 10 mm  camera was advanced into the abdomen and there was immediately seen  inflammation in the right lower quadrant of the small intestine.  There  was also purulent fluid.  The appendix was not immediately visualized.  A 5 mm trocar was placed in the left lower quadrant under direct  visualization.  The grasper was used to manipulate the small intestine  and the cecum and a normal appendix were seen.  There was no definite  evidence that it was perforated.  The laparoscopy equipment was removed  and a midline incision was made with a  #10 blade.  Subcutaneous tissue was divided with the Bovie.  The fascia  was entered at the midline with the Bovie.  The peritoneum was then  entered near the trocar with the Bovie.  A hand was advanced into the  abdomen to protect the intraabdominal contents while the Bovie was used  to carry out the peritoneal and fascial incision and the remainder of  the skin incision.  This was initially made just to the top of the  original incision; however, this had to  be extended slightly to just  above the umbilicus.  The abdomen was first irrigated.  A Balfour was  used to retract the abdominal wall.  The cecum was identified and the  appendix was seen to be normal.  The small bowel was run as it was very  inflamed in the distal region.  This was not indurated, just very  hyperemic.  The small bowel was run all the way from the terminal ileum  to the ligament of Treitz and there was no evidence of perforation of  the small intestine.  The cecum was hyperemic, but soft.  The sigmoid  colon had a site of perforation identified just above the superior  rectum.  The remainder of the colon was palpated and was found to be  soft.  There were no masses palpated.  The stomach was also examined and  there was no evidence of perforation of the stomach.  The abdomen was  then again copiously irrigated.  The sigmoid was mobilized by taking  down the white line of Toldt with Bovie electrocautery.  We stayed high  above the location of the ureters.   The appropriate site for division  was identified below the diverticular perforation.  The mesentery was  scored with the Bovie and then digital palpation was used to come just  around the bowel.  The contour stapler was passed across the superior  rectum and fired.  The appropriate site at the distal descending colon  was identified for division and again the mesentery was scored on either  side with the Bovie.  Digital palpation was used to dissect off the  fatty tissues of the mesocolon.  The 75-mm GIA was passed across the  colon and fired.  The LigaSure was used to take the mesenteric  attachments.  The IMA was oversewn with a 3-0 Vicryl suture.  This again  was high up off of the retroperitoneum.  The rectum was marked using a 2-  0 Prolene on either side.  The abdomen was re-irrigated and an  appropriate site for colostomy was palpated on the left side of the  abdomen.  Kocher clamps were used to provide tension on the abdominal  wall.  The skin of the appropriate site for the ostomy was elevated and  a circular skin incision was made with the Bovie.  Subcutaneous tissues  were divided with the Bovie and left [employer] retractors.  The fascia  was opened up anteriorly in a cruciate fashion.  The muscle was spread  with a Kelly clamp and a side branch of the epigastric artery was  coagulated.  This was not noted to be bleeding.  The posterior layer of  fascia was opened up with the Bovie as well in a cruciate fashion.  A  laparotomy sponge was used to protect the intra-abdominal contents  during this time.  The fascial incision was palpated for appropriate  caliber and easily passed two digits.  The end of the descending colon  was advanced through the abdominal wall under no tension.  The fascia  was then closed using running 0-looped PDS suture.  The skin was  irrigated and closed with staples.  Needle and sponge counts were  correct x2.  The ostomy was matured using 4-0 Vicryl  sutures in an  interrupted fashion.  The trocar site was closed with 4-0 Monocryl.  The  wounds were then dressed with island dressing and colostomy bag.  The  patient was awakened from anesthesia and  taken to the PACU in stable  condition.      Almond Lint, MD  Electronically Signed     FB/MEDQ  D:  08/12/2008  T:  08/12/2008  Job:  [id number]

## 2010-06-10 NOTE — Consult Note (Signed)
NAME:  Preston Reeves, Preston Reeves NO.:  [id number]   MEDICAL RECORD NO.:  [id number]          PATIENT TYPE:  INP   LOCATION:  1501                         FACILITY:  [facility]   PHYSICIAN:  Heloise Purpura, MD      DATE OF BIRTH:  December 14, 1940   DATE OF CONSULTATION:  08/15/2008  DATE OF DISCHARGE:                                 CONSULTATION   CHIEF COMPLAINT/REASON FOR CONSULTATION:  Penile swelling with possible  paraphimosis.   HISTORY:  Preston Reeves is a 70 year old gentleman seen at the request of  Dr. Almond Lint who presented to the hospital on August 12, 2008, with  abdominal pain.  He underwent evaluation by Dr. Donell Beers and was found to  have a bowel perforation.  He was taken to the operating room for  exploration and was found to have a perforated sigmoid colon secondary  to diverticulitis.  A colostomy was performed, and he has been  recovering as expected.  He underwent a voiding trial yesterday and was  unable to void, requiring a Foley catheter to be replaced.  At baseline,  he has minimal voiding symptoms which include only nocturia one time per  night.  He has very little bother with the way that he typically voids.  He denies a history of hematuria, UTIs, GU malignancy/trauma/surgery.   Currently, he denies any significant penile or scrotal pain.   PAST MEDICAL HISTORY:  1. Hemochromatosis.  2. Hypothyroidism.  3. Mitral valve prolapse.   PAST SURGICAL HISTORY:  1. History of left inguinal hernia repair.  2. Colostomy with a history of perforated sigmoid colon secondary to      diverticulitis.   MEDICATIONS:  Current medications include ertapenem, Lasix, Synthroid,  morphine, Phenergan and tamsulosin which was begun today.   ALLERGIES:  NO KNOWN DRUG ALLERGIES.   FAMILY HISTORY:  Paternal history of colon cancer.   SOCIAL HISTORY:  He will occasionally drink alcohol but denies any other  substance use.   REVIEW OF SYSTEMS:  A complete review of systems  was performed.  All  systems are negative except as in the history of present illness.   PHYSICAL EXAMINATION:  VITALS:  Temperature 98.7, pulse 89, respirations  20, blood pressure 124/68.  Urine output 2325 mL of urine over the last  shift.  CONSTITUTIONAL:  Well-nourished, well-developed age-appropriate male in  no acute distress.  HEENT:  Normocephalic, atraumatic.  NECK:  No JVD or lymphadenopathy.  CARDIOVASCULAR:  Regular rate and rhythm.  PULMONARY:  Normal respiratory effort.  ABDOMEN:  Soft and nondistended.  He does have a patent colostomy.  GU:  Normal uncircumcised male phallus.  He does have scrotal and penile  edema without evidence for paraphimosis.  Testes are descended  bilaterally and are nontender and without masses.  DIGITAL RECTAL EXAM:  Prostate is enlarged measuring approximately 70-80  grams without nodularity or induration.  Normal sphincter tone.  No  rectal masses.  EXTREMITIES:  Bilateral lower extremity edema.  NEUROLOGIC:  Grossly intact.   IMPRESSION:  1. Scrotal/penile edema.  2. Postoperative urinary retention.   RECOMMENDATIONS:  1. I would recommend scrotal elevation, and his penile and scrotal      edema should resolve over the next couple of weeks but is simply      secondary to dependent edema.  2. I agree with starting tamsulosin 0.4 mg p.o. at bedtime, and it      would be appropriate to consider a voiding trial in the next 1-2      days.  If he is unable to void at that point, it would be      appropriate to increase his tamsulosin 0.8 mg p.o. at bedtime, and      depending on the length of hospitalization, possibly another      voiding trial during this hospitalization.  Otherwise, it would be      appropriate to have them follow up as an outpatient for further      evaluation and treatment.      Heloise Purpura, MD  Electronically Signed     LB/MEDQ  D:  08/15/2008  T:  08/15/2008  Job:  [id number]   cc:   Almond Lint, MD  [address]  [place] [address]

## 2010-06-11 ENCOUNTER — Other Ambulatory Visit: Payer: Self-pay | Admitting: *Deleted

## 2010-06-12 ENCOUNTER — Other Ambulatory Visit: Payer: Self-pay | Admitting: *Deleted

## 2010-06-13 ENCOUNTER — Other Ambulatory Visit (INDEPENDENT_AMBULATORY_CARE_PROVIDER_SITE_OTHER): Payer: Medicare Other | Admitting: *Deleted

## 2010-06-13 ENCOUNTER — Encounter: Payer: Self-pay | Admitting: *Deleted

## 2010-06-13 DIAGNOSIS — E7800 Pure hypercholesterolemia, unspecified: Secondary | ICD-10-CM

## 2010-06-13 DIAGNOSIS — Z79899 Other long term (current) drug therapy: Secondary | ICD-10-CM

## 2010-06-13 DIAGNOSIS — K5790 Diverticulosis of intestine, part unspecified, without perforation or abscess without bleeding: Secondary | ICD-10-CM | POA: Insufficient documentation

## 2010-06-13 DIAGNOSIS — I341 Nonrheumatic mitral (valve) prolapse: Secondary | ICD-10-CM | POA: Insufficient documentation

## 2010-06-13 DIAGNOSIS — Z87438 Personal history of other diseases of male genital organs: Secondary | ICD-10-CM | POA: Insufficient documentation

## 2010-06-13 DIAGNOSIS — Z125 Encounter for screening for malignant neoplasm of prostate: Secondary | ICD-10-CM

## 2010-06-13 DIAGNOSIS — J449 Chronic obstructive pulmonary disease, unspecified: Secondary | ICD-10-CM | POA: Insufficient documentation

## 2010-06-13 LAB — PSA: PSA: 2.5 ng/mL (ref 0.10–4.00)

## 2010-06-13 LAB — BASIC METABOLIC PANEL
BUN: 18 mg/dL (ref 6–23)
CO2: 27 mEq/L (ref 19–32)
Calcium: 9 mg/dL (ref 8.4–10.5)
Chloride: 106 mEq/L (ref 96–112)
Creatinine, Ser: 0.9 mg/dL (ref 0.4–1.5)

## 2010-06-13 LAB — HEPATIC FUNCTION PANEL
ALT: 15 U/L (ref 0–53)
Bilirubin, Direct: 0.1 mg/dL (ref 0.0–0.3)
Total Protein: 7.1 g/dL (ref 6.0–8.3)

## 2010-06-13 LAB — CBC WITH DIFFERENTIAL/PLATELET
Basophils Absolute: 0 10*3/uL (ref 0.0–0.1)
Eosinophils Absolute: 0.2 10*3/uL (ref 0.0–0.7)
HCT: 26.4 % — ABNORMAL LOW (ref 39.0–52.0)
Hemoglobin: 8.7 g/dL — ABNORMAL LOW (ref 13.0–17.0)
Lymphs Abs: 0.7 10*3/uL (ref 0.7–4.0)
MCHC: 32.9 g/dL (ref 30.0–36.0)
MCV: 77.1 fl — ABNORMAL LOW (ref 78.0–100.0)
Monocytes Absolute: 0.6 10*3/uL (ref 0.1–1.0)
Neutro Abs: 3.1 10*3/uL (ref 1.4–7.7)
Platelets: 307 10*3/uL (ref 150.0–400.0)
RDW: 18.3 % — ABNORMAL HIGH (ref 11.5–14.6)

## 2010-06-13 LAB — LIPID PANEL
Cholesterol: 129 mg/dL (ref 0–200)
HDL: 35.5 mg/dL — ABNORMAL LOW (ref >39.00)
Triglycerides: 44 mg/dL (ref 0.0–149.0)

## 2010-06-13 NOTE — Discharge Summary (Signed)
NAME:  Preston Reeves, Preston Reeves                ACCOUNT NO.:  [id number]   MEDICAL RECORD NO.:  [id number]          PATIENT TYPE:  INP   LOCATION:  1501                         FACILITY:  [facility]   PHYSICIAN:  Almond Lint, MD       DATE OF BIRTH:  [birth year]   DATE OF ADMISSION:  08/11/2008  DATE OF DISCHARGE:  08/16/2008                               DISCHARGE SUMMARY   FINAL DIAGNOSIS:  Perforated diverticulitis.   SECONDARY DIAGNOSES:  Include:  1. Hemochromatosis.  2. Hypothyroidism.  3. Mitral valve prolapse.  4. Urinary retention.   DISCHARGE MEDICATIONS:  1. Levoxyl 0.137 mcg once a day.  2. Claritin 10 mg once a day.  3. Aspirin 81 mg once a day.  4. Percocet 5/325 one to 2 tabs p.o. every 4 hours p.r.n. pain.  5. Flomax 0.4 mg once a day.  6. Colace 100 mg p.o. b.i.d.   PRINCIPAL PROCEDURE:  Sigmoid colectomy with end colostomy and creation  of Hartmann pouch.   SIGNIFICANT LAB VALUES:  [name] was admitted with leukocytosis.  His  creatinine was 1.19 upon admission.  This stabilized down at 1.   HOSPITAL COURSE:  [name] was admitted to the hospital from the  emergency department with perforated diverticulitis.  He was taken to  the operating room and a Hartmann procedure performed.  Postoperatively,  he did well with the exception of requiring Foley reinsertion x2.  He  transitioned to clears and then to full liquids readily.  He at that  time had no evidence of wound infection.  He was given Lasix for some  significant lower extremity edema.  He also required phosphate repletion  for hypophosphatemia down to a low of 1.8.  This corrected prior to  discharge.  He did require a discharge with a leg bag with his Foley.  His wife and he were taught how to do it, as well as had ostomy  teaching.  He was discharged, however, with home health to follow up in  order to make sure that they did not have any additional questions about  the ostomy.  He was discharged to home in  improved condition.      Almond Lint, MD  Electronically Signed     FB/MEDQ  D:  09/04/2008  T:  09/04/2008  Job:  [id number]

## 2010-06-16 ENCOUNTER — Encounter: Payer: Self-pay | Admitting: Cardiology

## 2010-06-16 ENCOUNTER — Ambulatory Visit
Admission: RE | Admit: 2010-06-16 | Discharge: 2010-06-16 | Disposition: A | Discharge status: Home / Self Care | Payer: Medicare Other | Source: Ambulatory Visit | Attending: Cardiology | Admitting: Cardiology

## 2010-06-16 ENCOUNTER — Ambulatory Visit (INDEPENDENT_AMBULATORY_CARE_PROVIDER_SITE_OTHER): Payer: Medicare Other | Admitting: Cardiology

## 2010-06-16 VITALS — BP 135/74 | HR 78 | Wt 199.0 lb

## 2010-06-16 DIAGNOSIS — R0609 Other forms of dyspnea: Secondary | ICD-10-CM | POA: Insufficient documentation

## 2010-06-16 DIAGNOSIS — I341 Nonrheumatic mitral (valve) prolapse: Secondary | ICD-10-CM

## 2010-06-16 DIAGNOSIS — I059 Rheumatic mitral valve disease, unspecified: Secondary | ICD-10-CM

## 2010-06-16 DIAGNOSIS — D649 Anemia, unspecified: Secondary | ICD-10-CM

## 2010-06-16 NOTE — Progress Notes (Signed)
Preston Reeves Date of Birth:  December 20, 1940 [facility] [address]   [address] [place], [place]  [address] [phone]           Fax   [phone]  History of Present Illness: This 70 year old Caucasian male is seen for a comprehensive medical evaluation.  He has a past history of hemachromatosis.  He is followed for this by Dr. Suszanne Conners he has phlebotomies several times a year to keep his serum iron level low.  Recently the patient has been experiencing some shortness of breath with exertion.  He was surprised to find that the patient is anemic today.  His hemoglobin is 8.7 with microcytic indices.  He has not been experiencing any hematochezia or melena.  The patient has a remote history of diverticulitis and in July 2010 he was admitted to the hospital with acute diverticulitis required surgery was discharged and then returned to the hospital with a wound infection and had to have a temporary colostomy.  The colostomy was reversed 10 weeks later.  In the hospital the patient also had difficulty voiding and was followed by Dr. Laverle Patter.  He's not having a difficulty voiding at the present patient has a past history of thyrotoxicosis which was treated with radioactive iodine and he is now on thyroid replacement and Dr. Lucianne Muss is his endocrinologist.  Current Outpatient Prescriptions  Medication Sig Dispense Refill  . Ascorbic Acid (VITAMIN C) 1000 MG tablet Take 1,000 mg by mouth daily.        Marland Kitchen aspirin 81 MG tablet Take 81 mg by mouth 2 (two) times daily.       . Cholecalciferol (VITAMIN D) 2000 UNITS CAPS Take by mouth.        . Cyanocobalamin (VITAMIN B 12 PO) Take by mouth. Taking 1000 daily       . loratadine (CLARITIN) 10 MG tablet Take 10 mg by mouth daily. As needed       . Multiple Vitamins-Minerals (ADEKS) chewable tablet Chew 1 tablet by mouth daily. Taking regular MVI      . Omega-3 Fatty Acids (FISH OIL PO) Take by mouth. Taking 900 daily       . Red  Yeast Rice Extract (RED YEAST RICE PO) Take by mouth. Taking 2 daily of 600 mg       . THYROID PO Take by mouth. Taking .137 daily         No Known Allergies  Patient Active Problem List  Diagnoses  . MVP (mitral valve prolapse)  . Diverticulosis  . History of BPH  . Hemochromatosis  . History of COPD  . Dyspnea on exertion    History  Smoking status  . Former Smoker  . Types: Cigarettes  Smokeless tobacco  . Not on file    History  Alcohol Use     Family History  Problem Relation Age of Onset  . Heart disease Mother   . Cancer Father   . Heart disease Father   . Diabetes Father     Review of Systems: Constitutional: no fever chills diaphoresis or fatigue or change in weight.  Head and neck: no hearing loss, no epistaxis, no photophobia or visual disturbance. Respiratory: No cough, shortness of breath or wheezing. Cardiovascular: No chest pain peripheral edema, palpitations. Gastrointestinal: No abdominal distention, no abdominal pain, no change in bowel habits hematochezia or melena. Genitourinary: No dysuria, no frequency, no urgency, no nocturia. Musculoskeletal:No arthralgias, no back pain, no gait disturbance or  myalgias. Neurological: No dizziness, no headaches, no numbness, no seizures, no syncope, no weakness, no tremors. Hematologic: No lymphadenopathy, no easy bruising. Psychiatric: No confusion, no hallucinations, no sleep disturbance.    Physical Exam: Filed Vitals:   06/16/10 0924  BP: 135/74  Pulse: 78  Examination the patient is a well-developed well-nourished gentleman in no distress.Pupils equal and reactive.   Extraocular Movements are full.  There is no scleral icterus.  The mouth and pharynx are normal.  The neck is supple.  The carotids reveal no bruits.  The jugular venous pressure is normal.  The thyroid is not enlarged.  There is no lymphadenopathy.  Funduscopic examination is normal.  The ears reveal slight cerumen bilaterally.The chest  is clear to percussion and auscultation. There are no rales or rhonchi. Expansion of the chest is symmetrical.  The heart reveals a faint apical systolic click.  No murmur gallop or rub.  No abnormal lift or heave.The abdomen is soft and nontender. Bowel sounds are normal. The liver and spleen are not enlarged. There Are no abdominal masses. There are no bruits.  He does have a midline and left lower quadrant scars from the previous acute diverticulitis surgery in 2010.  Rectal exam reveals minimally enlarged smooth prostate.  Stool brown benzidine negative.  No rectal mass.  Normal extremity without phlebitis or edemaStrength is normal and symmetrical in all extremities.  There is no lateralizing weakness.  There are no sensory deficits.The skin is warm and dry.  There is no rash. The skin has a slightly bronze character consistent with his history of hemachromatosis.   Assessment / Plan: Dyspnea secondary to anemia.  The microcytic anemia is most likely related to his phlebotomies.  We are checking a chest x-ray today to be sure there is no other cause for the exertional.  We will encourage him to cut back somewhat on his request to have phlebotomy and see if this makes a beneficial difference. He will continue to followup with Dr. Myna Hidalgo.He'll return here in one year for a comprehensive medical examination.

## 2010-06-16 NOTE — Assessment & Plan Note (Signed)
The patient has a history of hemachromatosis.  He donates blood every times a year.  When he donates blood he donates 2 units of packed cells at that time or what he refers to as a double.  He has not been aware of being anemic.  He has not been experiencing any hematochezia or melena.

## 2010-06-16 NOTE — Assessment & Plan Note (Signed)
The patient has a remote history of mitral valve prolapse.  He has not been aware of any palpitations.  His not having any chest pain.

## 2010-06-16 NOTE — Assessment & Plan Note (Signed)
The patient has been experiencing some exertional dyspnea he's not having any dyspnea at rest.  He is still able to play 18 holes of golf.  He's not had any chest pain.  He does not have any history of ischemic heart disease and had a normal nuclear stress test on 03/16/02.  His ejection fraction was 50%.

## 2010-06-17 ENCOUNTER — Telehealth: Payer: Self-pay | Admitting: *Deleted

## 2010-06-17 NOTE — Telephone Encounter (Signed)
Message copied by Regis Bill on Tue Jun 17, 2010 11:50 AM ------      Message from: Cassell Clement      Created: Mon Jun 16, 2010  9:04 PM       Please report.  Xray shows some evidence of COPD from prior smoking and some pleural plaques but no acute cardiopulmonary disease. The heart size is normal. Continue present Rx

## 2010-06-17 NOTE — Telephone Encounter (Signed)
Advised wife of chest xray results

## 2010-07-07 ENCOUNTER — Other Ambulatory Visit: Payer: Self-pay | Admitting: Hematology & Oncology

## 2010-07-07 ENCOUNTER — Encounter (HOSPITAL_BASED_OUTPATIENT_CLINIC_OR_DEPARTMENT_OTHER): Payer: Medicare Other | Admitting: Hematology & Oncology

## 2010-07-07 LAB — COMPREHENSIVE METABOLIC PANEL
AST: 20 U/L (ref 0–37)
Alkaline Phosphatase: 70 U/L (ref 39–117)
Glucose, Bld: 96 mg/dL (ref 70–99)
Sodium: 140 mEq/L (ref 135–145)
Total Bilirubin: 0.4 mg/dL (ref 0.3–1.2)
Total Protein: 7.8 g/dL (ref 6.0–8.3)

## 2010-07-07 LAB — CBC WITH DIFFERENTIAL (CANCER CENTER ONLY)
EOS%: 4.2 % (ref 0.0–7.0)
Eosinophils Absolute: 0.2 10*3/uL (ref 0.0–0.5)
LYMPH%: 19.3 % (ref 14.0–48.0)
MCH: 23 pg — ABNORMAL LOW (ref 28.0–33.4)
MCHC: 30.7 g/dL — ABNORMAL LOW (ref 32.0–35.9)
MCV: 75 fL — ABNORMAL LOW (ref 82–98)
MONO%: 11.2 % (ref 0.0–13.0)
Platelets: 339 10*3/uL (ref 145–400)
RBC: 3.91 10*6/uL — ABNORMAL LOW (ref 4.20–5.70)
RDW: 16.5 % — ABNORMAL HIGH (ref 11.1–15.7)

## 2011-01-05 ENCOUNTER — Ambulatory Visit (HOSPITAL_BASED_OUTPATIENT_CLINIC_OR_DEPARTMENT_OTHER): Payer: Medicare Other | Admitting: Hematology & Oncology

## 2011-01-05 ENCOUNTER — Other Ambulatory Visit: Payer: Self-pay | Admitting: Hematology & Oncology

## 2011-01-05 ENCOUNTER — Other Ambulatory Visit (HOSPITAL_BASED_OUTPATIENT_CLINIC_OR_DEPARTMENT_OTHER): Payer: Medicare Other | Admitting: Lab

## 2011-01-05 DIAGNOSIS — J329 Chronic sinusitis, unspecified: Secondary | ICD-10-CM

## 2011-01-05 LAB — CBC WITH DIFFERENTIAL (CANCER CENTER ONLY)
BASO%: 0.3 % (ref 0.0–2.0)
EOS%: 4.3 % (ref 0.0–7.0)
LYMPH#: 0.9 10*3/uL (ref 0.9–3.3)
MCHC: 30.9 g/dL — ABNORMAL LOW (ref 32.0–35.9)
MONO#: 0.6 10*3/uL (ref 0.1–0.9)
Platelets: 298 10*3/uL (ref 145–400)
RDW: 19.3 % — ABNORMAL HIGH (ref 11.1–15.7)
WBC: 6.5 10*3/uL (ref 4.0–10.0)

## 2011-01-05 LAB — IRON AND TIBC
%SAT: 7 % — ABNORMAL LOW (ref 20–55)
Iron: 22 ug/dL — ABNORMAL LOW (ref 42–165)
Iron: 22 ug/dL — ABNORMAL LOW (ref 42–165)
UIBC: 275 ug/dL (ref 125–400)
UIBC: 275 ug/dL (ref 125–400)

## 2011-01-05 LAB — RETICULOCYTES (CHCC)
ABS Retic: 56.2 10*3/uL (ref 19.0–186.0)
Retic Ct Pct: 1.3 % (ref 0.4–2.3)

## 2011-01-05 LAB — CHCC SATELLITE - SMEAR

## 2011-01-05 LAB — FERRITIN
Ferritin: 14 ng/mL — ABNORMAL LOW (ref 22–322)
Ferritin: 14 ng/mL — ABNORMAL LOW (ref 22–322)

## 2011-01-05 MED ORDER — AMOXICILLIN-POT CLAVULANATE 875-125 MG PO TABS: 1.0000 | ORAL_TABLET | Freq: Two times a day (BID) | ORAL | Status: AC

## 2011-01-05 NOTE — Progress Notes (Signed)
This office note has been dictated.

## 2011-01-06 NOTE — Progress Notes (Signed)
CC:   Preston Reeves, M.D.  DIAGNOSIS:  Hemochromatosis.  CURRENT THERAPY:  Phlebotomy to maintain ferritin less than 100.  INTERIM HISTORY:  [name] comes in for a 27-month followup.  When we last saw him in June, he was anemic, a lot of this because of marked iron deficiency.  He has also been donating blood.  He has not done any since we last saw him.  He is feeling well.  He has had no complaints.  Been doing home improvement stuff on his house.  He will be going to [place] for 3 months after Christmas.  He has a motor home.  He has had no obvious bleeding.  He has had no cough or shortness breath.  He does have a bad sinus infection right now.  He is not taking any kind of medication for this.  We will have to go ahead and order something for him.  PHYSICAL EXAMINATION:  This is a well-developed, well-nourished white gentleman in no obvious distress stress.  Vital signs:  Temperature 97, pulse 86, respiratory rate 12, blood pressure 138/75.  Weight 198.  Head and neck:  A normocephalic, atraumatic skull.  There are no ocular or oral lesions.  There are no palpable cervical or supraclavicular lymph nodes.  Lungs:  Clear bilaterally.  Cardiac:  Regular rate and rhythm with a normal S1 and S2.  There are no murmurs, rubs or bruits. Abdomen:  Soft with good bowel sounds.  There is no palpable abdominal mass.  There is no fluid wave.  There is no palpable hepatosplenomegaly. Extremities:  No clubbing, cyanosis or edema.  Neurologic:  No focal neurologic deficits.  Skin:  No rash, ecchymosis or petechiae.  LABORATORY STUDIES:  White count is 6.5, hemoglobin 9.7, hematocrit 31.4, platelet count 298.  MCV is 75.  IMPRESSION:  [name] is a 70 year old gentleman with hemochromatosis. We have not had to phlebotomize him, which is a nice thing.  I think we can probably get him back in a year as usual.  His hemoglobin is coming up.  Again, I told him not to donate blood until  we see him back.  I will call in some Augmentin for him for his sinus infection.    ______________________________ [name], M.D. PRE/MEDQ  D:  01/05/2011  T:  01/06/2011  Job:  [id number]

## 2011-01-08 LAB — TRANSFERRIN RECEPTOR, SOLUABLE: Transferrin Receptor, Soluble: 2.62 mg/L — ABNORMAL HIGH (ref 0.76–1.76)

## 2011-05-05 ENCOUNTER — Telehealth: Payer: Self-pay | Admitting: Cardiology

## 2011-05-05 DIAGNOSIS — Z87438 Personal history of other diseases of male genital organs: Secondary | ICD-10-CM

## 2011-05-05 DIAGNOSIS — I341 Nonrheumatic mitral (valve) prolapse: Secondary | ICD-10-CM

## 2011-05-05 NOTE — Telephone Encounter (Signed)
Pt's wife set up appt for may reck, pt needs PSA need order please, and does he need to come in early to have it drawn?

## 2011-05-05 NOTE — Telephone Encounter (Signed)
Scheduled labs and put in the system

## 2011-06-19 ENCOUNTER — Other Ambulatory Visit (INDEPENDENT_AMBULATORY_CARE_PROVIDER_SITE_OTHER): Payer: Medicare Other

## 2011-06-19 DIAGNOSIS — I059 Rheumatic mitral valve disease, unspecified: Secondary | ICD-10-CM

## 2011-06-19 DIAGNOSIS — Z87438 Personal history of other diseases of male genital organs: Secondary | ICD-10-CM

## 2011-06-19 DIAGNOSIS — I341 Nonrheumatic mitral (valve) prolapse: Secondary | ICD-10-CM

## 2011-06-19 DIAGNOSIS — Z125 Encounter for screening for malignant neoplasm of prostate: Secondary | ICD-10-CM

## 2011-06-19 DIAGNOSIS — Z79899 Other long term (current) drug therapy: Secondary | ICD-10-CM

## 2011-06-19 LAB — CBC WITH DIFFERENTIAL/PLATELET
Basophils Absolute: 0 10*3/uL (ref 0.0–0.1)
Eosinophils Relative: 3.8 % (ref 0.0–5.0)
Lymphs Abs: 0.8 10*3/uL (ref 0.7–4.0)
MCV: 77.2 fl — ABNORMAL LOW (ref 78.0–100.0)
Monocytes Absolute: 0.5 10*3/uL (ref 0.1–1.0)
Monocytes Relative: 12.6 % — ABNORMAL HIGH (ref 3.0–12.0)
Neutrophils Relative %: 64.1 % (ref 43.0–77.0)
Platelets: 294 10*3/uL (ref 150.0–400.0)
RDW: 20 % — ABNORMAL HIGH (ref 11.5–14.6)
WBC: 4.1 10*3/uL — ABNORMAL LOW (ref 4.5–10.5)

## 2011-06-19 LAB — HEPATIC FUNCTION PANEL
AST: 23 U/L (ref 0–37)
Albumin: 3.8 g/dL (ref 3.5–5.2)
Alkaline Phosphatase: 55 U/L (ref 39–117)
Bilirubin, Direct: 0.1 mg/dL (ref 0.0–0.3)

## 2011-06-19 LAB — LIPID PANEL
HDL: 35.5 mg/dL — ABNORMAL LOW (ref >39.00)
LDL Cholesterol: 102 mg/dL — ABNORMAL HIGH (ref 0–99)
Total CHOL/HDL Ratio: 4
VLDL: 12.4 mg/dL (ref 0.0–40.0)

## 2011-06-19 LAB — BASIC METABOLIC PANEL
Calcium: 9.3 mg/dL (ref 8.4–10.5)
GFR: 86.12 mL/min (ref >60.00)
Glucose, Bld: 99 mg/dL (ref 70–99)
Potassium: 4.4 mEq/L (ref 3.5–5.1)
Sodium: 141 mEq/L (ref 135–145)

## 2011-06-19 LAB — PSA: PSA: 3.09 ng/mL (ref 0.10–4.00)

## 2011-06-22 NOTE — Progress Notes (Signed)
Quick Note:  Please make copy of labs for patient visit. ______ 

## 2011-06-24 ENCOUNTER — Ambulatory Visit (INDEPENDENT_AMBULATORY_CARE_PROVIDER_SITE_OTHER): Payer: Medicare Other | Admitting: Cardiology

## 2011-06-24 ENCOUNTER — Encounter: Payer: Self-pay | Admitting: Cardiology

## 2011-06-24 VITALS — BP 148/84 | HR 64 | Ht 71.5 in | Wt 191.8 lb

## 2011-06-24 DIAGNOSIS — I059 Rheumatic mitral valve disease, unspecified: Secondary | ICD-10-CM

## 2011-06-24 DIAGNOSIS — K5730 Diverticulosis of large intestine without perforation or abscess without bleeding: Secondary | ICD-10-CM

## 2011-06-24 DIAGNOSIS — Z8709 Personal history of other diseases of the respiratory system: Secondary | ICD-10-CM

## 2011-06-24 DIAGNOSIS — K5790 Diverticulosis of intestine, part unspecified, without perforation or abscess without bleeding: Secondary | ICD-10-CM

## 2011-06-24 DIAGNOSIS — I341 Nonrheumatic mitral (valve) prolapse: Secondary | ICD-10-CM

## 2011-06-24 DIAGNOSIS — R0602 Shortness of breath: Secondary | ICD-10-CM

## 2011-06-24 NOTE — Progress Notes (Signed)
Patient ID: Preston Reeves, male   DOB: [birth year], 71 y.o.   MRN: [id number]

## 2011-06-24 NOTE — Progress Notes (Signed)
Preston Reeves Date of Birth:  March 17, 1940 [facility] [address] [place], [place]  [address] [phone]         Fax   [phone]  History of Present Illness: This pleasant 71 year old gentleman is seen for a one-year comprehensive examination.  He has been in good general health.  Since last year he has had no new health issues arise.  He has a past history of thyrotoxicosis and is followed by Dr. Lucianne Muss.  He was Street with radioactive iodine and is now on thyroid replacement.  The patient has a history of hemochromatosis and is followed by hematology.  He i is being treated with periodic phlebotomies.  Over the past year he has required phlebotomy less often.  The patient has a history of mild mitral valve prolapse.  He's had a history of mild stable exertional dyspnea.  His chest x-rays in the past have shown him evidence of pleural plaque.  Current Outpatient Prescriptions  Medication Sig Dispense Refill  . Ascorbic Acid (VITAMIN C) 1000 MG tablet Take 1,000 mg by mouth daily.        Marland Kitchen aspirin 81 MG tablet Take 81 mg by mouth 2 (two) times daily.       . Calcium Carbonate-Vitamin D (CALCIUM + D PO) Take by mouth daily.      . Cholecalciferol (VITAMIN D) 2000 UNITS CAPS Take by mouth.        . Cyanocobalamin (VITAMIN B 12 PO) Take by mouth. Taking 1000 daily       . LEVOXYL 125 MCG tablet Take 125 mcg by mouth daily.       Marland Kitchen loratadine (CLARITIN) 10 MG tablet Take 10 mg by mouth daily. As needed       . Multiple Vitamins-Minerals (MULTIVITAMIN PO) Take by mouth.      . Omega-3 Fatty Acids (FISH OIL PO) Take by mouth. Taking 900 daily       . Red Yeast Rice Extract (RED YEAST RICE PO) Take by mouth. Taking 2 daily of 600 mg       . THYROID PO Take by mouth. Taking .137 daily         No Known Allergies  Patient Active Problem List  Diagnoses  . MVP (mitral valve prolapse)  . Diverticulosis  . History of BPH  . Hemochromatosis  . History of COPD  .  Dyspnea on exertion    History  Smoking status  . Former Smoker  . Types: Cigarettes  Smokeless tobacco  . Not on file    History  Alcohol Use     Family History  Problem Relation Age of Onset  . Heart disease Mother   . Cancer Father   . Heart disease Father   . Diabetes Father     Review of Systems: Constitutional: no fever chills diaphoresis or fatigue or change in weight.  Head and neck: no hearing loss, no epistaxis, no photophobia or visual disturbance. Respiratory: No cough, shortness of breath or wheezing. Cardiovascular: No chest pain peripheral edema, palpitations. Gastrointestinal: No abdominal distention, no abdominal pain, no change in bowel habits hematochezia or melena. Genitourinary: No dysuria, no frequency, no urgency, no nocturia. Musculoskeletal:No arthralgias, no back pain, no gait disturbance or myalgias. Neurological: No dizziness, no headaches, no numbness, no seizures, no syncope, no weakness, no tremors. Hematologic: No lymphadenopathy, no easy bruising. Psychiatric: No confusion, no hallucinations, no sleep disturbance.    Physical Exam: Filed Vitals:   06/24/11 1651  BP: 148/84  Pulse: 64   the general appearance reveals a well-developed well-nourished gentleman in no distress.Pupils equal and reactive.   Extraocular Movements are full.  There is no scleral icterus.  The mouth and pharynx are normal.  The neck is supple.  The carotids reveal no bruits.  The jugular venous pressure is normal.  The thyroid is not enlarged.  There is no lymphadenopathy.  The chest is clear to percussion and auscultation. There are no rales or rhonchi. Expansion of the chest is symmetrical.  Heart reveals a faint apical click.  No gallop or rub. The abdomen is soft and nontender. Bowel sounds are normal. The liver and spleen are not enlarged. There Are no abdominal masses. There are no bruits.  The pedal pulses are good.  There is no phlebitis or edema.  There  is no cyanosis or clubbing. Strength is normal and symmetrical in all extremities.  There is no lateralizing weakness.  There are no sensory deficits.  EKG today shows normal sinus rhythm and is within normal limits.  Chest x-ray is pending.  Assessment / Plan: The patient is clinically stable.  We will continue same medication and he'll be rechecked in one year for followup apprehensive medical evaluation and PSA.

## 2011-06-24 NOTE — Assessment & Plan Note (Signed)
The patient has a history of mild COPD.  He quit smoking in about 2005 when he retired from work.  Is not having any purulent sputum fever chills or pleuritic chest pain.

## 2011-06-24 NOTE — Assessment & Plan Note (Signed)
The patient has a past history of severe diverticulosis with perforation requiring a temporary colostomy which was subsequently reversed.  Lahey is not having any GI symptoms.  He takes a stool softener daily

## 2011-06-24 NOTE — Assessment & Plan Note (Signed)
The patient has mild exertional dyspnea.  He stays physically active.  He does his own yard work.  He plays golf twice a week.  He spent 30 winter months down in [place] visiting his sister and he did a lot of projects while he was there including building a split rail fence.  The patient has not been experiencing any orthopnea or paroxysmal nocturnal dyspnea or ankle edema.

## 2011-06-24 NOTE — Assessment & Plan Note (Signed)
The patient has not been experiencing any chest pain or palpitations 

## 2011-06-24 NOTE — Patient Instructions (Signed)
Will have you go for a chest xray and call with you results  Your physician recommends that you continue on your current medications as directed. Please refer to the Current Medication list given to you today.  Your physician wants you to follow-up in: 1 year You will receive a reminder letter in the mail two months in advance. If you don't receive a letter, please call our office to schedule the follow-up appointment.

## 2011-06-25 ENCOUNTER — Ambulatory Visit (INDEPENDENT_AMBULATORY_CARE_PROVIDER_SITE_OTHER)
Admission: RE | Admit: 2011-06-25 | Discharge: 2011-06-25 | Disposition: A | Discharge status: Home / Self Care | Payer: Medicare Other | Source: Ambulatory Visit | Attending: Cardiology | Admitting: Cardiology

## 2011-06-25 DIAGNOSIS — R0609 Other forms of dyspnea: Secondary | ICD-10-CM

## 2011-06-26 NOTE — Progress Notes (Signed)
Pt notified of results

## 2011-06-30 NOTE — Progress Notes (Signed)
Addended by: Reine Just on: 06/30/2011 08:18 PM   Modules accepted: Orders

## 2012-01-04 ENCOUNTER — Other Ambulatory Visit (HOSPITAL_BASED_OUTPATIENT_CLINIC_OR_DEPARTMENT_OTHER): Payer: Medicare Other | Admitting: Lab

## 2012-01-04 ENCOUNTER — Ambulatory Visit (HOSPITAL_BASED_OUTPATIENT_CLINIC_OR_DEPARTMENT_OTHER): Payer: Medicare Other | Admitting: Hematology & Oncology

## 2012-01-04 LAB — CBC WITH DIFFERENTIAL (CANCER CENTER ONLY)
BASO%: 0.6 % (ref 0.0–2.0)
EOS%: 5.1 % (ref 0.0–7.0)
HCT: 36.8 % — ABNORMAL LOW (ref 38.7–49.9)
LYMPH#: 1 10*3/uL (ref 0.9–3.3)
MCHC: 32.1 g/dL (ref 32.0–35.9)
MONO#: 0.6 10*3/uL (ref 0.1–0.9)
NEUT#: 3.3 10*3/uL (ref 1.5–6.5)
NEUT%: 63.3 % (ref 40.0–80.0)
Platelets: 277 10*3/uL (ref 145–400)
RDW: 16.8 % — ABNORMAL HIGH (ref 11.1–15.7)
WBC: 5.3 10*3/uL (ref 4.0–10.0)

## 2012-01-04 LAB — FERRITIN: Ferritin: 9 ng/mL — ABNORMAL LOW (ref 22–322)

## 2012-01-04 LAB — RETICULOCYTES (CHCC)
ABS Retic: 46.1 10*3/uL (ref 19.0–186.0)
RBC.: 4.61 MIL/uL (ref 4.22–5.81)
Retic Ct Pct: 1 % (ref 0.4–2.3)

## 2012-01-04 NOTE — Progress Notes (Signed)
This office note has been dictated.

## 2012-01-05 NOTE — Progress Notes (Signed)
CC:   Preston Reeves, M.D.  DIAGNOSIS:  Hemochromatosis.  CURRENT THERAPY:  Phlebotomy to maintain ferritin less than 100.  INTERIM HISTORY:  Preston Reeves comes in for followup.  We see him every 12 months.  He has been doing well.  He had a good year last year.  When we last saw him, he was quite anemic.  He has been donating blood. As such, has has not donated any blood this year.  When we last saw him, his iron saturation was only 7%.  His ferritin was 14.  He has had no cough or shortness of breath.  He has had no change in bowel or bladder habits.  There has been no headache.  He has had no leg swelling.  There have been no rashes.  PHYSICAL EXAMINATION:  General:  This is a well-developed, well- nourished white gentleman in no obvious distress.  Vital signs:  Show a temperature of 97.6, pulse 93, respiratory rate 18, blood pressure 157/73.  Weight is 194.  Head and neck:  Shows a normocephalic, atraumatic skull.  There are no ocular or oral lesions.  There are no palpable cervical or supraclavicular lymph nodes.  Lungs:  Clear bilaterally.  Cardiac:  Regular rate and rhythm with a normal S1 and S2. There are no murmurs, rubs or bruits.  Abdomen:  Shows a soft abdomen with good bowel sounds.  There is no palpable abdominal mass.  There is no fluid wave.  There is no palpable hepatosplenomegaly.  Extremities: Show no clubbing, cyanosis or edema.  Neurologic:  No focal neurological deficits.  Skin:  No rashes, ecchymoses or petechiae.  LABORATORY STUDIES:  White cell count is 5.3, hemoglobin 11.8, hematocrit 36.8, platelet count 277.  MCV is 81.  IMPRESSION:  Preston Reeves is a 71 year old gentleman with hemochromatosis. I am pretty sure that he is positive for the major mutation.  I doubt that we will have to phlebotomize him.  I do not think that we have ever had to phlebotomize him.  We will plan for another yearly followup.   ______________________________ [name], M.D. PRE/MEDQ  D:  01/04/2012  T:  01/05/2012  Job:  [id number]

## 2012-06-27 ENCOUNTER — Ambulatory Visit (INDEPENDENT_AMBULATORY_CARE_PROVIDER_SITE_OTHER): Payer: Medicare Other | Admitting: *Deleted

## 2012-06-27 DIAGNOSIS — I341 Nonrheumatic mitral (valve) prolapse: Secondary | ICD-10-CM

## 2012-06-27 DIAGNOSIS — Z87438 Personal history of other diseases of male genital organs: Secondary | ICD-10-CM

## 2012-06-27 DIAGNOSIS — E7800 Pure hypercholesterolemia, unspecified: Secondary | ICD-10-CM

## 2012-06-27 DIAGNOSIS — I059 Rheumatic mitral valve disease, unspecified: Secondary | ICD-10-CM

## 2012-06-27 DIAGNOSIS — Z87898 Personal history of other specified conditions: Secondary | ICD-10-CM

## 2012-06-27 LAB — BASIC METABOLIC PANEL
Calcium: 9.2 mg/dL (ref 8.4–10.5)
GFR: 81.76 mL/min (ref >60.00)
Glucose, Bld: 98 mg/dL (ref 70–99)
Potassium: 4.1 mEq/L (ref 3.5–5.1)
Sodium: 138 mEq/L (ref 135–145)

## 2012-06-27 LAB — HEPATIC FUNCTION PANEL
ALT: 16 U/L (ref 0–53)
AST: 22 U/L (ref 0–37)
Albumin: 3.7 g/dL (ref 3.5–5.2)
Alkaline Phosphatase: 54 U/L (ref 39–117)

## 2012-06-27 LAB — LIPID PANEL
Total CHOL/HDL Ratio: 4
Triglycerides: 61 mg/dL (ref 0.0–149.0)

## 2012-06-27 NOTE — Progress Notes (Signed)
Quick Note:  Please make copy of labs for patient visit. ______ 

## 2012-07-04 ENCOUNTER — Encounter: Payer: Self-pay | Admitting: Cardiology

## 2012-07-04 ENCOUNTER — Ambulatory Visit (INDEPENDENT_AMBULATORY_CARE_PROVIDER_SITE_OTHER)
Admission: RE | Admit: 2012-07-04 | Discharge: 2012-07-04 | Disposition: A | Discharge status: Home / Self Care | Payer: Medicare Other | Source: Ambulatory Visit | Attending: Cardiology | Admitting: Cardiology

## 2012-07-04 ENCOUNTER — Ambulatory Visit (INDEPENDENT_AMBULATORY_CARE_PROVIDER_SITE_OTHER): Payer: Medicare Other | Admitting: Cardiology

## 2012-07-04 VITALS — BP 152/74 | HR 71 | Ht 71.0 in | Wt 191.8 lb

## 2012-07-04 DIAGNOSIS — I341 Nonrheumatic mitral (valve) prolapse: Secondary | ICD-10-CM

## 2012-07-04 DIAGNOSIS — K5790 Diverticulosis of intestine, part unspecified, without perforation or abscess without bleeding: Secondary | ICD-10-CM

## 2012-07-04 DIAGNOSIS — E7800 Pure hypercholesterolemia, unspecified: Secondary | ICD-10-CM

## 2012-07-04 DIAGNOSIS — K5730 Diverticulosis of large intestine without perforation or abscess without bleeding: Secondary | ICD-10-CM

## 2012-07-04 DIAGNOSIS — I059 Rheumatic mitral valve disease, unspecified: Secondary | ICD-10-CM

## 2012-07-04 DIAGNOSIS — Z8709 Personal history of other diseases of the respiratory system: Secondary | ICD-10-CM

## 2012-07-04 DIAGNOSIS — Z87898 Personal history of other specified conditions: Secondary | ICD-10-CM

## 2012-07-04 DIAGNOSIS — Z87438 Personal history of other diseases of male genital organs: Secondary | ICD-10-CM

## 2012-07-04 NOTE — Assessment & Plan Note (Signed)
He has not had a flareup of diverticulitis .

## 2012-07-04 NOTE — Addendum Note (Signed)
Addended by: Regis Bill B on: 07/04/2012 02:54 PM   Modules accepted: Orders

## 2012-07-04 NOTE — Assessment & Plan Note (Addendum)
The patient has not been experiencing any palpitations or exertional chest pain.  He does not have any shortness of breath with ordinary activity. They have a son who lives [place] and visit him they are aware of significant exertional dyspnea.

## 2012-07-04 NOTE — Progress Notes (Signed)
Preston Reeves Date of Birth:  06/19/1940 [facility] [address] [place], [place]  [address] [phone]         Fax   [phone]  History of Present Illness: This pleasant 72 year old gentleman is seen for a one-year comprehensive examination. He has been in good general health. Since last year he has had no new health issues arise. He has a past history of thyrotoxicosis and is followed by Dr. Lucianne Muss. He was previously treated with radioactive iodine and is now on thyroid replacement.  The patient appears to be clinically euthyroid. The patient has a history of hemochromatosis and is followed by hematology. He i is being treated with periodic phlebotomies he has not had to have any recent phlebotomies however Over the past year he has required phlebotomy less often. The patient has a history of mild mitral valve prolapse. He's had a history of mild stable exertional dyspnea. His chest x-rays in the past have shown him evidence of pleural plaque.   Current Outpatient Prescriptions  Medication Sig Dispense Refill  . Ascorbic Acid (VITAMIN C) 1000 MG tablet Take 1,000 mg by mouth daily.        Marland Kitchen aspirin 81 MG tablet Take 81 mg by mouth 2 (two) times daily.       . Calcium Carbonate-Vitamin D (CALCIUM + D PO) Take by mouth daily.      . Cholecalciferol (VITAMIN D) 2000 UNITS CAPS Take by mouth.        . Cyanocobalamin (VITAMIN B 12 PO) Take by mouth. Taking 1000 daily       . levothyroxine (LEVOXYL) 137 MCG tablet Take 137 mcg by mouth daily before breakfast.      . loratadine (CLARITIN) 10 MG tablet Take 10 mg by mouth daily. As needed       . Methylcellulose, Laxative, (CITRUCEL PO) Take by mouth daily.      . Multiple Vitamins-Minerals (MULTIVITAMIN PO) Take by mouth.      . Omega-3 Fatty Acids (FISH OIL PO) Take by mouth. Taking 900 daily       . Red Yeast Rice Extract (RED YEAST RICE PO) Take by mouth. Taking 2 daily of 600 mg       . SALINE NASAL SPRAY NA Place  into the nose daily.       No current facility-administered medications for this visit.    No Known Allergies  Patient Active Problem List   Diagnosis Date Noted  . Dyspnea on exertion 06/16/2010    Priority: High  . MVP (mitral valve prolapse)     Priority: Medium  . Hemochromatosis     Priority: Medium  . Diverticulosis   . History of BPH   . History of COPD     History  Smoking status  . Former Smoker  . Types: Cigarettes  Smokeless tobacco  . Not on file    History  Alcohol Use     Family History  Problem Relation Age of Onset  . Heart disease Mother   . Cancer Father   . Heart disease Father   . Diabetes Father     Review of Systems: Constitutional: no fever chills diaphoresis or fatigue or change in weight.  Head and neck: no hearing loss, no epistaxis, no photophobia or visual disturbance. Respiratory: No cough, shortness of breath or wheezing. Cardiovascular: No chest pain peripheral edema, palpitations. Gastrointestinal: No abdominal distention, no abdominal pain, no change in bowel habits hematochezia or melena. Genitourinary:  No dysuria, no frequency, no urgency, no nocturia. Musculoskeletal:No arthralgias, no back pain, no gait disturbance or myalgias. Neurological: No dizziness, no headaches, no numbness, no seizures, no syncope, no weakness, no tremors. Hematologic: No lymphadenopathy, no easy bruising. Psychiatric: No confusion, no hallucinations, no sleep disturbance.    Physical Exam: Filed Vitals:   07/04/12 1404  BP: 152/74  Pulse: 71   the general appearance reveals a well-developedwell-nourished gentleman in no distress.The head and neck exam reveals pupils equal and reactive.  Extraocular movements are full.  There is no scleral icterus.  The mouth and pharynx are normal.  The neck is supple.  The carotids reveal no bruits.  The jugular venous pressure is normal.  The  thyroid is not enlarged.  There is no lymphadenopathy.  The chest is  clear to percussion and auscultation.  There are no rales or rhonchi.  Expansion of the chest is symmetrical.  The precordium is quiet.  The first heart sound is normal.  The second heart sound is physiologically split.  There is no murmur gallop rub. Faint apical click. There is no abnormal lift or heave.  The abdomen is soft and nontender.  The bowel sounds are normal.  The liver and spleen are not enlarged.  There are no abdominal masses.  There are no abdominal bruits.  Extremities reveal good pedal pulses.  There is no phlebitis or edema.  There is no cyanosis or clubbing.  Strength is normal and symmetrical in all extremities.  There is no lateralizing weakness.  There are no sensory deficits.  The skin is warm and dry.  There is no rash.  EKG shows only sinus rhythm and voltage for LVH   Assessment / Plan: Overall doing well.  No new cardiac complaints. We will update his chest x-ray because of the history of pleural plaque. He will use over-the-counter nasal spray as needed for seasonal allergy. Recheck in one year for followup office visit EKG lipid panel hepatic function panel basal metabolic panel and PSA

## 2012-07-04 NOTE — Patient Instructions (Addendum)
Will have you go to the [facility] across from [facility] for a chest Xray  Your physician recommends that you continue on your current medications as directed. Please refer to the Current Medication list given to you today.  Your physician wants you to follow-up in: 1 year ov/ekg/lp/bmet/hfp/psa You will receive a reminder letter in the mail two months in advance. If you don't receive a letter, please call our office to schedule the follow-up appointment.   START OVER THE COUNTER SALINE NASAL SPRAY

## 2012-07-04 NOTE — Assessment & Plan Note (Signed)
The patient quit smoking about 8 years ago.  His chest x-rays are abnormal and show pleural plaque.  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

## 2012-07-05 ENCOUNTER — Telehealth: Payer: Self-pay | Admitting: *Deleted

## 2012-07-05 NOTE — Telephone Encounter (Signed)
Message copied by Burnell Blanks on Tue Jul 05, 2012  2:15 PM ------      Message from: Cassell Clement      Created: Mon Jul 04, 2012  8:32 PM       Chest xray shows no new changes.  Pleural plaques are stable. ------

## 2012-07-05 NOTE — Telephone Encounter (Signed)
Advised patient

## 2012-12-26 ENCOUNTER — Other Ambulatory Visit (INDEPENDENT_AMBULATORY_CARE_PROVIDER_SITE_OTHER): Payer: Medicare Other

## 2012-12-26 ENCOUNTER — Other Ambulatory Visit: Payer: Self-pay | Admitting: *Deleted

## 2012-12-26 DIAGNOSIS — E039 Hypothyroidism, unspecified: Secondary | ICD-10-CM

## 2012-12-26 LAB — T4, FREE: Free T4: 1.28 ng/dL (ref 0.60–1.60)

## 2012-12-28 ENCOUNTER — Encounter: Payer: Self-pay | Admitting: Endocrinology

## 2012-12-28 ENCOUNTER — Ambulatory Visit (INDEPENDENT_AMBULATORY_CARE_PROVIDER_SITE_OTHER): Payer: Medicare Other | Admitting: Endocrinology

## 2012-12-28 VITALS — BP 154/74 | HR 75 | Temp 98.1°F | Resp 12 | Ht 71.0 in | Wt 187.2 lb

## 2012-12-28 DIAGNOSIS — E890 Postprocedural hypothyroidism: Secondary | ICD-10-CM

## 2012-12-28 DIAGNOSIS — E039 Hypothyroidism, unspecified: Secondary | ICD-10-CM | POA: Insufficient documentation

## 2012-12-28 MED ORDER — SYNTHROID 137 MCG PO TABS: 137.0000 ug | ORAL_TABLET | Freq: Every day | ORAL | Status: DC

## 2012-12-28 NOTE — Patient Instructions (Signed)
Same dose and use Synthroid 137ug in ams

## 2012-12-28 NOTE — Progress Notes (Signed)
Reason for Appointment:  Hypothyroidism, followup visit    History of Present Illness:   The hypothyroidism was first diagnosed in 2007 after radioactive iodine treatment for Graves' disease   The patient has been treated with both Levoxyl and generic levothyroxine The last visit was in 6/14 and at that time his dose was increased to 137 mcg because of a high TSH of 7.5   Patient has no complaints of unusual fatigue, cold sensitivity, dry skin, unusual weight gain      The patient is taking the thyroid supplement very regularly in the morning before breakfast. He is taking calcium at night  He is concerned about the cost of brand name medication and did not get the Levoxyl brand as prescribed  Appointment on 12/26/2012  Component Date Value Range Status  . TSH 12/26/2012 1.60  0.35 - 5.50 uIU/mL Final  . Free T4 12/26/2012 1.28  0.60 - 1.60 ng/dL Final      Medication List       This list is accurate as of: 12/28/12  9:39 AM.  Always use your most recent med list.               aspirin 81 MG tablet  Take 81 mg by mouth 2 (two) times daily.     CALCIUM + D PO  Take by mouth daily.     CITRUCEL PO  Take by mouth daily.     FISH OIL PO  Take by mouth. Taking 900 daily     LEVOXYL 137 MCG tablet  Generic drug:  levothyroxine  Take 137 mcg by mouth daily before breakfast.     loratadine 10 MG tablet  Commonly known as:  CLARITIN  Take 10 mg by mouth daily. As needed     MULTIVITAMIN PO  Take by mouth.     RED YEAST RICE PO  Take by mouth. Taking 2 daily of 600 mg     SALINE NASAL SPRAY NA  Place into the nose daily.     VITAMIN B 12 PO  Take by mouth. Taking 1000 daily     vitamin C 1000 MG tablet  Take 1,000 mg by mouth daily.     Vitamin D 2000 UNITS Caps  Take by mouth.        Allergies: No Known Allergies  Past Medical History  Diagnosis Date  . MVP (mitral valve prolapse)   . Diverticulosis     hx of  . History of BPH   . Hemochromatosis      hx of  . History of COPD     Past Surgical History  Procedure Laterality Date  . Inguinal hernia repair  1999    Dr. Maryagnes Amos    Family History  Problem Relation Age of Onset  . Heart disease Mother   . Cancer Father   . Heart disease Father   . Diabetes Father     Social History:  reports that he has quit smoking. His smoking use included Cigarettes. He smoked 0.00 packs per day. He does not have any smokeless tobacco history on file. His alcohol and drug histories are not on file.  REVIEW Of SYSTEMS:  He has regular exams with his PCP every spring   Examination:   BP 154/74  Pulse 75  Temp(Src) 98.1 F (36.7 C)  Resp 12  Ht 5\' 11"  (1.803 m)  Wt 187 lb 3.2 oz (84.913 kg)  BMI 26.12 kg/m2  SpO2 95%  GENERAL APPEARANCE: Alert,  no puffiness of the face or eyes    Assessment   Hypothyroidism, post ablative with normal TSH now His dose has fluctuated between 125 and 137 and needs fairly regular adjustment of his dose probably because of using different generics   Treatment:   After discussion he agrees to switch to brand name Synthroid which is covered by his insurance Continue same dosage before breakfast daily. Avoid taking any calcium or iron supplements with the thyroid supplement.   Follow up in one year  [facility] 12/28/2012, 9:39 AM

## 2013-01-02 ENCOUNTER — Ambulatory Visit (HOSPITAL_BASED_OUTPATIENT_CLINIC_OR_DEPARTMENT_OTHER): Payer: Medicare Other | Admitting: Hematology & Oncology

## 2013-01-02 ENCOUNTER — Other Ambulatory Visit (HOSPITAL_BASED_OUTPATIENT_CLINIC_OR_DEPARTMENT_OTHER): Payer: Medicare Other | Admitting: Lab

## 2013-01-02 LAB — CBC WITH DIFFERENTIAL (CANCER CENTER ONLY)
BASO#: 0 10*3/uL (ref 0.0–0.2)
Eosinophils Absolute: 0.2 10*3/uL (ref 0.0–0.5)
HCT: 40.8 % (ref 38.7–49.9)
HGB: 13.2 g/dL (ref 13.0–17.1)
LYMPH#: 0.9 10*3/uL (ref 0.9–3.3)
MONO#: 0.5 10*3/uL (ref 0.1–0.9)
NEUT%: 65 % (ref 40.0–80.0)
WBC: 4.7 10*3/uL (ref 4.0–10.0)

## 2013-01-02 LAB — IRON AND TIBC CHCC
%SAT: 23 % (ref 20–55)
TIBC: 266 ug/dL (ref 202–409)
UIBC: 205 ug/dL (ref 117–376)

## 2013-01-02 LAB — FERRITIN CHCC: Ferritin: 14 ng/ml — ABNORMAL LOW (ref 22–316)

## 2013-01-02 NOTE — Progress Notes (Signed)
This office note has been dictated.

## 2013-01-04 ENCOUNTER — Telehealth: Payer: Self-pay | Admitting: Nurse Practitioner

## 2013-01-04 NOTE — Progress Notes (Signed)
CC:   Preston Reeves, M.D.  DIAGNOSIS:  Hemochromatosis.  CURRENT THERAPY:  Phlebotomy to maintain ferritin less than 100.  INTERIM HISTORY:  Preston Reeves comes in for his followup.  He is doing okay.  We see him yearly.  He has not donated blood now for a year or so.  He was donating blood and was very anemic.  When we last saw him, his ferritin was only 9.  He has been traveling.  He was out in [place] a couple of months.  He is going down to [place] for a couple of months after Christmas.  He has had no change in medications.  He is on Synthroid for hypothyroidism.  He has had no cough or shortness of breath.  He has had a little bit of neck pain.  He has had no change in bowel or bladder habits.  He has had no rashes.  PHYSICAL EXAMINATION:  General:  This is a well-developed, well- nourished white gentleman, in no obvious distress.  Vital Signs: Temperature 97.6, pulse 84, respiratory rate 14, blood pressure 155/76. Weight is 184 pounds.  Head and Neck:  Normocephalic, atraumatic skull. There are no ocular or oral lesions.  There are no palpable cervical or supraclavicular lymph nodes.  Lungs:  Clear bilaterally.  Cardiac: Regular rate and rhythm with normal S1, S2.  There are no murmurs, rubs, or bruits.  Abdomen:  Soft.  He has good bowel sounds.  There is no palpable abdominal mass.  There is no palpable hepatosplenomegaly. Back:  No tenderness over the spine, ribs, or hips.  Extremities:  No clubbing, cyanosis, or edema.  He has good range motion of his joints. Skin:  No rashes, ecchymosis, or petechia.  LABORATORY STUDIES:  White cell count is 4.7, hemoglobin 13.2, hematocrit 40.8, platelet count 265.  MCV is 85.  His liver function tests (June 27, 2012) are normal.  IMPRESSION:  Preston Reeves is a very nice, 72 year old gentleman.  He has hemochromatosis.  I do not think we have really checked his DNA status. We may consider doing this at some point down the road.   He has a diagnosis of hemochromatosis.  I probably would have look through the old records to see exactly what his genetic mutation is.  Again, I told him that we can have him donate blood again if he wanted to.  I think his blood count is well enough that he can donate blood, which would be safe, to help somebody else.  I will plan to get Preston Reeves back to see me in another year.    ______________________________ Preston Reeves, M.D. PRE/MEDQ  D:  01/02/2013  T:  01/02/2013  Job:  [id number]

## 2013-01-04 NOTE — Telephone Encounter (Addendum)
Message copied by Glee Arvin on Wed Jan 04, 2013 11:19 AM ------      Message from: Arlan Organ R      Created: Mon Jan 02, 2013  9:56 PM       Call - iron is still low!!  Only 14!!  Still no need for phlebotomy!!!  Pete ------LVM on pt's personal machine informing of the above. Encouraged him to contact the office with any further needs.

## 2013-01-28 ENCOUNTER — Ambulatory Visit (INDEPENDENT_AMBULATORY_CARE_PROVIDER_SITE_OTHER): Payer: Medicare HMO | Admitting: Emergency Medicine

## 2013-01-28 VITALS — BP 136/78 | HR 94 | Temp 97.9°F | Resp 16 | Ht 70.5 in | Wt 188.4 lb

## 2013-01-28 DIAGNOSIS — I10 Essential (primary) hypertension: Secondary | ICD-10-CM

## 2013-01-28 NOTE — Progress Notes (Addendum)
   Subjective:    Patient ID: Preston Reeves, male    DOB: 06/24/1940, 73 y.o.   MRN: [id number] This chart was scribed for [name], MD by Vernell Barrier, Medical Scribe. This patient's care was started at 9:55 AM.  HPI HPI Comments: Preston Reeves is a 73 y.o. male who presents to the [facility] in need of a gatekeeper for [facility]. Been with [employer] for the past 5 years.   Saw Dr. Mare Ferrari in May 2014. Will see again May 2015. Followed primarily for mitral valve prolapse. Seen once per year. Have had a flu shot this year. Medications do not need refill. Colostomy 5-6 years ago. Seen by Dr. Dwyane Dee once a year for thyroid. Seen by Dr. Marin Olp once per year for Hemochromatosis  Denies chest pain.  Review of Systems  Cardiovascular: Negative for chest pain.      Objective:   Physical Exam  CONSTITUTIONAL: Well developed/well nourished HEAD: Normocephalic/atraumatic EYES: EOMI/PERRL ENMT: Mucous membranes moist NECK: supple no meningeal signs SPINE:entire spine nontender CV: S1/S2 noted, no murmurs/rubs/gallops noted LUNGS: Lungs are clear to auscultation bilaterally, no apparent distress ABDOMEN: soft, nontender, no rebound or guarding there are multiple scars present on the lower abdomen there is a healed colostomy site GU:no cva tenderness NEURO: Pt is awake/alert, moves all extremitiesx4 EXTREMITIES: pulses normal, full ROM SKIN: warm, color normal PSYCH: no abnormalities of mood noted    Filed Vitals:   01/28/13 0921  BP: 136/78  Pulse: 94  Temp: 97.9 F (36.6 C)  TempSrc: Oral  Resp: 16  Height: 5' 10.5" (1.791 m)  Weight: 188 lb 6.4 oz (85.458 kg)  SpO2: 95%        Assessment & Plan:   Patient does not need any refills on his medications. Referral made to Dr. Mare Ferrari to followup on his hypertension and mitral disease. When seen for their physicals I will make other referrals to Dr. Marin Olp who follows his hemochromatosis and  Dr. Dwyane Dee who follows his thyroid disease. I personally performed the services described in this documentation, which was scribed in my presence. The recorded information has been reviewed and is accurate.

## 2013-06-20 ENCOUNTER — Ambulatory Visit (INDEPENDENT_AMBULATORY_CARE_PROVIDER_SITE_OTHER): Payer: Medicare HMO | Admitting: Emergency Medicine

## 2013-06-20 ENCOUNTER — Encounter: Payer: Self-pay | Admitting: Emergency Medicine

## 2013-06-20 VITALS — BP 118/60 | HR 73 | Temp 98.0°F | Resp 16 | Ht 69.5 in | Wt 182.0 lb

## 2013-06-20 DIAGNOSIS — K469 Unspecified abdominal hernia without obstruction or gangrene: Secondary | ICD-10-CM

## 2013-06-20 DIAGNOSIS — Z0000 Encounter for general adult medical examination without abnormal findings: Secondary | ICD-10-CM

## 2013-06-20 DIAGNOSIS — Z23 Encounter for immunization: Secondary | ICD-10-CM

## 2013-06-20 DIAGNOSIS — Z125 Encounter for screening for malignant neoplasm of prostate: Secondary | ICD-10-CM

## 2013-06-20 DIAGNOSIS — H6120 Impacted cerumen, unspecified ear: Secondary | ICD-10-CM

## 2013-06-20 DIAGNOSIS — E890 Postprocedural hypothyroidism: Secondary | ICD-10-CM

## 2013-06-20 DIAGNOSIS — K432 Incisional hernia without obstruction or gangrene: Secondary | ICD-10-CM

## 2013-06-20 LAB — CBC WITH DIFFERENTIAL/PLATELET
Basophils Absolute: 0 10*3/uL (ref 0.0–0.1)
Basophils Relative: 0 % (ref 0–1)
EOS ABS: 0.1 10*3/uL (ref 0.0–0.7)
EOS PCT: 2 % (ref 0–5)
HCT: 38.6 % — ABNORMAL LOW (ref 39.0–52.0)
HEMOGLOBIN: 13.3 g/dL (ref 13.0–17.0)
LYMPHS PCT: 7 % — AB (ref 12–46)
Lymphs Abs: 0.5 10*3/uL — ABNORMAL LOW (ref 0.7–4.0)
MCH: 30.4 pg (ref 26.0–34.0)
MCHC: 34.5 g/dL (ref 30.0–36.0)
MCV: 88.3 fL (ref 78.0–100.0)
MONOS PCT: 12 % (ref 3–12)
Monocytes Absolute: 0.8 10*3/uL (ref 0.1–1.0)
Neutro Abs: 5.3 10*3/uL (ref 1.7–7.7)
Neutrophils Relative %: 79 % — ABNORMAL HIGH (ref 43–77)
PLATELETS: 247 10*3/uL (ref 150–400)
RBC: 4.37 MIL/uL (ref 4.22–5.81)
RDW: 15 % (ref 11.5–15.5)
WBC: 6.7 10*3/uL (ref 4.0–10.5)

## 2013-06-20 LAB — POCT URINALYSIS DIPSTICK
BILIRUBIN UA: NEGATIVE
Blood, UA: NEGATIVE
Glucose, UA: NEGATIVE
LEUKOCYTES UA: NEGATIVE
Nitrite, UA: NEGATIVE
Protein, UA: 30
Spec Grav, UA: >=1.03
Urobilinogen, UA: 0.2
pH, UA: 5.5

## 2013-06-20 LAB — IFOBT (OCCULT BLOOD): IMMUNOLOGICAL FECAL OCCULT BLOOD TEST: POSITIVE

## 2013-06-20 NOTE — Progress Notes (Signed)
This chart was scribed for Darlyne Russian, MD by Eston Mould, ED Scribe. This patient was seen in room Room/bed 21 and the patient's care was started at 2:02 PM. Subjective:    Patient ID: Preston Reeves, male    DOB: 09-20-1940, 73 y.o.   MRN: [id number] Chief Complaint  Patient presents with  . Annual Exam   HPI Preston Reeves is a 73 y.o. male who presents to the [facility] for annual physical. States he has been feeling well. Reports having bronchitis while in [place] few weeks ago and was seen at an Urgent Care and discharged with a rx; sx subsided with medication. Dr. Pamelia Hoit monitors pts thyroid. Dr. Jonette Eva monitors pts hemachromatosis yearly; last unit removed was 5 years ago. Denies having problems or complications within the past few years. Has 6 children and all his children are negative for the gene. Dr. Ernesto Rutherford monitors and cleans pts ears, due to having "pockets in ear that collect ear wax frequently"; last seen 2-3 years ago.  Colonoscopy: last done 2-3 years ago; Dr. Henrene Pastor. Found polyps once but last WNL. Shingles: last received in the fall.  Pneumonia shot: last received 2-3+ years ago.  Flu shot: generally gets during the fall season.  Eye exam: Dr. Lorrin Jackson is Opthamologist. Will have cataract surgery potentially this year.  Hx of surgeries: Ruptured diverticulitis in 2007 and had a colostomy bag.  Patient Active Problem List   Diagnosis Date Noted  . Other postablative hypothyroidism 12/28/2012  . Dyspnea on exertion 06/16/2010  . MVP (mitral valve prolapse)   . Diverticulosis   . History of BPH   . Hemochromatosis   . History of COPD    Current outpatient prescriptions:Ascorbic Acid (VITAMIN C) 1000 MG tablet, Take 1,000 mg by mouth daily.  , Disp: , Rfl: ;  aspirin 81 MG tablet, Take 81 mg by mouth 2 (two) times daily. , Disp: , Rfl: ;  Biotin 10 MG CAPS, Take by mouth once., Disp: , Rfl: ;  Calcium Carbonate-Vitamin D (CALCIUM + D PO), Take by mouth daily., Disp: ,  Rfl: ;  Cholecalciferol (VITAMIN D) 2000 UNITS CAPS, Take by mouth.  , Disp: , Rfl:  Cyanocobalamin (VITAMIN B 12 PO), Take by mouth. Taking 1000 daily , Disp: , Rfl: ;  fexofenadine-pseudoephedrine (ALLEGRA-D 24) 180-240 MG per 24 hr tablet, Take 1 tablet by mouth daily., Disp: , Rfl: ;  loratadine (CLARITIN) 10 MG tablet, Take 10 mg by mouth daily. As needed , Disp: , Rfl: ;  Methylcellulose, Laxative, (CITRUCEL PO), Take by mouth daily., Disp: , Rfl: ;  Multiple Vitamins-Minerals (MULTIVITAMIN PO), Take by mouth., Disp: , Rfl:  Omega-3 Fatty Acids (FISH OIL PO), Take by mouth. Taking 900 daily , Disp: , Rfl: ;  Red Yeast Rice Extract (RED YEAST RICE PO), Take by mouth. Taking 2 daily of 600 mg , Disp: , Rfl: ;  SALINE NASAL SPRAY NA, Place into the nose daily., Disp: , Rfl: ;  SYNTHROID 137 MCG tablet, Take 1 tablet (137 mcg total) by mouth daily before breakfast., Disp: 90 tablet, Rfl: 3  Review of Systems  All other systems reviewed and are negative.  Objective:   Physical Exam CONSTITUTIONAL: Well developed/well nourished HEAD: Normocephalic/atraumatic EYES: EOMI/PERRL ENMT: Mucous membranes moist NECK: supple no meningeal signs SPINE:entire spine nontender CV: S1/S2 noted, no murmurs/rubs/gallops noted LUNGS: Lungs are clear to auscultation bilaterally, no apparent distress ABDOMEN: soft, nontender, no rebound or guarding. Large midline scare with a grapefruit size  reducible abdominal wall hernia.  GU:no cva tenderness. Prostate was normal size. NEURO: Pt is awake/alert, moves all extremitiesx4 EXTREMITIES: pulses normal, full ROM SKIN: warm, color normal PSYCH: no abnormalities of mood noted  Triage Vitals:BP 118/60  Pulse 73  Temp(Src) 98 F (36.7 C) (Oral)  Resp 16  Ht 5' 9.5" (1.765 m)  Wt 182 lb (82.555 kg)  BMI 26.50 kg/m2  SpO2 96% Assessment & Plan:  Patient good health. He will review the indications or meningococcal vaccine. Referral made to Gen. surgery to evaluate  his large hernia. I personally performed the services described in this documentation, which was scribed in my presence. The recorded information has been reviewed and is accurate.

## 2013-06-20 NOTE — Addendum Note (Signed)
Addended by: [name] A on: 06/20/2013 03:14 PM   Modules accepted: Orders

## 2013-06-20 NOTE — Addendum Note (Signed)
Addended by: Yvette Rack on: 06/20/2013 03:03 PM   Modules accepted: Orders

## 2013-06-20 NOTE — Progress Notes (Signed)
   Subjective:    Patient ID: Preston Reeves, male    DOB: 11/16/1940, 73 y.o.   MRN: [id number]  HPI    Review of Systems     Objective:   Physical Exam        Assessment & Plan:

## 2013-06-20 NOTE — Addendum Note (Signed)
Addended by: Lanna Poche R on: 06/20/2013 02:57 PM   Modules accepted: Orders

## 2013-06-21 ENCOUNTER — Other Ambulatory Visit: Payer: Self-pay | Admitting: *Deleted

## 2013-06-21 DIAGNOSIS — R195 Other fecal abnormalities: Secondary | ICD-10-CM

## 2013-06-21 LAB — LIPID PANEL
Cholesterol: 149 mg/dL (ref 0–200)
HDL: 42 mg/dL (ref >39)
LDL CALC: 99 mg/dL (ref 0–99)
Total CHOL/HDL Ratio: 3.5 Ratio
Triglycerides: 42 mg/dL (ref <150)
VLDL: 8 mg/dL (ref 0–40)

## 2013-06-21 LAB — TSH: TSH: 3.265 u[IU]/mL (ref 0.350–4.500)

## 2013-06-21 LAB — COMPLETE METABOLIC PANEL WITH GFR
ALBUMIN: 3.9 g/dL (ref 3.5–5.2)
ALT: 14 U/L (ref 0–53)
AST: 24 U/L (ref 0–37)
Alkaline Phosphatase: 56 U/L (ref 39–117)
BILIRUBIN TOTAL: 0.8 mg/dL (ref 0.2–1.2)
BUN: 14 mg/dL (ref 6–23)
CO2: 27 mEq/L (ref 19–32)
Calcium: 9.4 mg/dL (ref 8.4–10.5)
Chloride: 104 mEq/L (ref 96–112)
Creat: 1.01 mg/dL (ref 0.50–1.35)
GFR, EST AFRICAN AMERICAN: 85 mL/min
GFR, EST NON AFRICAN AMERICAN: 73 mL/min
GLUCOSE: 96 mg/dL (ref 70–99)
Potassium: 4.1 mEq/L (ref 3.5–5.3)
SODIUM: 140 meq/L (ref 135–145)
Total Protein: 7.4 g/dL (ref 6.0–8.3)

## 2013-06-21 LAB — PSA, MEDICARE: PSA: 3.5 ng/mL (ref <=4.00)

## 2013-06-22 ENCOUNTER — Telehealth: Payer: Self-pay

## 2013-06-22 NOTE — Telephone Encounter (Signed)
Pt thought he had hematuria and wanted to see Dr. Alinda Money if we were going to refer him to Urology but the pt had hematochezia and we are referring him to Dr. Katy Fitch

## 2013-06-22 NOTE — Telephone Encounter (Signed)
Pt is wanting to talk with someone about getting a referral to see dr Luberta Mutter number [phone]

## 2013-06-23 ENCOUNTER — Encounter: Payer: Self-pay | Admitting: Internal Medicine

## 2013-07-03 ENCOUNTER — Ambulatory Visit (INDEPENDENT_AMBULATORY_CARE_PROVIDER_SITE_OTHER): Payer: Commercial Managed Care - HMO | Admitting: Surgery

## 2013-07-10 ENCOUNTER — Other Ambulatory Visit: Payer: Medicare Other

## 2013-07-12 ENCOUNTER — Encounter: Payer: Self-pay | Admitting: Cardiology

## 2013-07-12 ENCOUNTER — Ambulatory Visit (INDEPENDENT_AMBULATORY_CARE_PROVIDER_SITE_OTHER): Payer: Commercial Managed Care - HMO | Admitting: Cardiology

## 2013-07-12 VITALS — BP 126/71 | HR 72 | Ht 69.5 in | Wt 183.0 lb

## 2013-07-12 DIAGNOSIS — R0609 Other forms of dyspnea: Secondary | ICD-10-CM

## 2013-07-12 DIAGNOSIS — Z8709 Personal history of other diseases of the respiratory system: Secondary | ICD-10-CM

## 2013-07-12 DIAGNOSIS — K5790 Diverticulosis of intestine, part unspecified, without perforation or abscess without bleeding: Secondary | ICD-10-CM

## 2013-07-12 DIAGNOSIS — K5730 Diverticulosis of large intestine without perforation or abscess without bleeding: Secondary | ICD-10-CM

## 2013-07-12 DIAGNOSIS — E890 Postprocedural hypothyroidism: Secondary | ICD-10-CM

## 2013-07-12 DIAGNOSIS — I341 Nonrheumatic mitral (valve) prolapse: Secondary | ICD-10-CM

## 2013-07-12 DIAGNOSIS — I059 Rheumatic mitral valve disease, unspecified: Secondary | ICD-10-CM

## 2013-07-12 DIAGNOSIS — R0989 Other specified symptoms and signs involving the circulatory and respiratory systems: Secondary | ICD-10-CM

## 2013-07-12 DIAGNOSIS — E7800 Pure hypercholesterolemia, unspecified: Secondary | ICD-10-CM

## 2013-07-12 NOTE — Progress Notes (Signed)
Preston Reeves Date of Birth:  1940-11-26 [place] [address] [place], [place]  [address] [phone]        Fax   [phone]   History of Present Illness: This pleasant 73 year old gentleman is seen for a one-year followup office visit. He has been in good general health. Since last year he has had no new health issues arise. He has a past history of thyrotoxicosis and is followed by Dr. Dwyane Dee. He was previously treated with radioactive iodine and is now on thyroid replacement. The patient appears to be clinically euthyroid. The patient has a history of hemochromatosis and is followed by hematology. He  is being treated with periodic phlebotomies he has not had to have any recent phlebotomies.  He has not required any phlebotomies in the last year. The patient has a history of mild mitral valve prolapse. He's had a history of mild stable exertional dyspnea. His chest x-rays in the past have shown him evidence of pleural plaque.   Current Outpatient Prescriptions  Medication Sig Dispense Refill  . Ascorbic Acid (VITAMIN C) 1000 MG tablet Take 1,000 mg by mouth daily.        Marland Kitchen aspirin 81 MG tablet Take 81 mg by mouth 2 (two) times daily.       . Biotin 10 MG CAPS Take by mouth once.      . Calcium Carbonate-Vitamin D (CALCIUM + D PO) Take by mouth daily.      . Cholecalciferol (VITAMIN D) 2000 UNITS CAPS Take by mouth.        . Cyanocobalamin (VITAMIN B 12 PO) Take by mouth. Taking 1000 daily       . fexofenadine-pseudoephedrine (ALLEGRA-D 24) 180-240 MG per 24 hr tablet Take 1 tablet by mouth daily.      Marland Kitchen loratadine (CLARITIN) 10 MG tablet Take 10 mg by mouth daily. As needed       . Methylcellulose, Laxative, (CITRUCEL PO) Take by mouth daily.      . Multiple Vitamins-Minerals (MULTIVITAMIN PO) Take by mouth.      . Omega-3 Fatty Acids (FISH OIL PO) Take by mouth. Taking 900 daily       . Red Yeast Rice Extract (RED YEAST RICE PO) Take by mouth. Taking 2  daily of 600 mg       . SALINE NASAL SPRAY NA Place into the nose daily.      Marland Kitchen SYNTHROID 137 MCG tablet Take 1 tablet (137 mcg total) by mouth daily before breakfast.  90 tablet  3   No current facility-administered medications for this visit.    No Known Allergies  Patient Active Problem List   Diagnosis Date Noted  . Dyspnea on exertion 06/16/2010    Priority: High  . MVP (mitral valve prolapse)     Priority: Medium  . Hemochromatosis     Priority: Medium  . Incisional hernia, without obstruction or gangrene 06/20/2013  . Other postablative hypothyroidism 12/28/2012  . Diverticulosis   . History of BPH   . History of COPD     History  Smoking status  . Former Smoker  . Types: Cigarettes  Smokeless tobacco  . Not on file    History  Alcohol Use  . Yes    Comment: 7drinks     Family History  Problem Relation Age of Onset  . Heart disease Mother   . Cancer Father   . Heart disease Father   . Diabetes  Father     Review of Systems: Constitutional: no fever chills diaphoresis or fatigue or change in weight.  Head and neck: no hearing loss, no epistaxis, no photophobia or visual disturbance. Respiratory: No cough, shortness of breath or wheezing. Cardiovascular: No chest pain peripheral edema, palpitations. Gastrointestinal: No abdominal distention, no abdominal pain, no change in bowel habits hematochezia or melena. Genitourinary: No dysuria, no frequency, no urgency, no nocturia. Musculoskeletal:No arthralgias, no back pain, no gait disturbance or myalgias. Neurological: No dizziness, no headaches, no numbness, no seizures, no syncope, no weakness, no tremors. Hematologic: No lymphadenopathy, no easy bruising. Psychiatric: No confusion, no hallucinations, no sleep disturbance.    Physical Exam: Filed Vitals:   07/12/13 1542  BP: 126/71  Pulse: 72   The general appearance reveals a well-developed well-nourished gentleman in no distress.The head and neck  exam reveals pupils equal and reactive.  Extraocular movements are full.  There is no scleral icterus.  The mouth and pharynx are normal.  The neck is supple.  The carotids reveal no bruits.  The jugular venous pressure is normal.  The  thyroid is not enlarged.  There is no lymphadenopathy.  The chest is clear to percussion and auscultation.  There are no rales or rhonchi.  Expansion of the chest is symmetrical.  The precordium is quiet.  The first heart sound is normal.  The second heart sound is physiologically split.  There is no murmur gallop rub.  There is a faint apical click.  There is no abnormal lift or heave.  The abdomen is soft and nontender.  The bowel sounds are normal.  The liver and spleen are not enlarged.  There are no abdominal masses.  There are no abdominal bruits.  Extremities reveal good pedal pulses.  There is no phlebitis or edema.  There is no cyanosis or clubbing.  Strength is normal and symmetrical in all extremities.  There is no lateralizing weakness.  There are no sensory deficits.  The skin is warm and dry.  There is no rash.  EKG shows normal sinus rhythm and slow R-wave progression across precordium.  No change since 07/04/12  Assessment / Plan: 1.  Mitral valve prolapse 2. Hypercholesterolemia 3. incisional abdominal hernia 4. history of hemachromatosis 5. history of post-ablative hypothyroidism, on Synthroid, followed by Dr. Dwyane Dee  Plan: Continue same medication.  Recheck in one year for office visit and EKG

## 2013-07-12 NOTE — Patient Instructions (Signed)
Your physician recommends that you continue on your current medications as directed. Please refer to the Current Medication list given to you today.  Your physician wants you to follow-up in: 1 year with EKG.   You will receive a reminder letter in the mail two months in advance. If you don't receive a letter, please call our office to schedule the follow-up appointment @ [phone]

## 2013-07-12 NOTE — Assessment & Plan Note (Signed)
The patient has not had any increased exertional dyspnea.  He quit smoking in about 2006

## 2013-07-12 NOTE — Assessment & Plan Note (Signed)
The patient has a history of diverticulosis and a past history of ruptured diverticulum.  As a result of previous intestinal surgery the patient has a prominent incisional hernia in the lower abdomen.  He has an appointment to see Dr. Breckyn Ticas [place] for surgical consultation.  The appointment was arranged by his PCP Dr. Everlene Farrier.

## 2013-07-12 NOTE — Assessment & Plan Note (Signed)
Patient is clinically euthyroid

## 2013-07-12 NOTE — Assessment & Plan Note (Signed)
The patient has not been having any chest pain or palpitations.

## 2013-08-02 ENCOUNTER — Telehealth (INDEPENDENT_AMBULATORY_CARE_PROVIDER_SITE_OTHER): Payer: Self-pay

## 2013-08-02 NOTE — Telephone Encounter (Signed)
Confirmed appt with Dr. Johney [place] on 08/23/13 at 11:00 a.m.

## 2013-08-02 NOTE — Telephone Encounter (Signed)
I guess that is okay.

## 2013-08-02 NOTE — Telephone Encounter (Signed)
This is an established patient of Dr. Marlowe Aschoff from 05/2010.  He was seen emergently for perforated viscus.  Pt had exploratory surgery and a Hartmann's procedure for perforated sigmoid colon/diverticulitis. He later developed a wound infection.  Now he has been sent back to [place] by Dr. Everlene Farrier with a diagnosis of incisional hernia.  The patient does not want to see Dr. Barry Dienes - he would like to see Dr. Johney [place].  Explained to the pt's wife I would send this message to Dr. Johney [place] and wait for his response.

## 2013-08-04 ENCOUNTER — Ambulatory Visit (INDEPENDENT_AMBULATORY_CARE_PROVIDER_SITE_OTHER): Payer: Commercial Managed Care - HMO | Admitting: General Surgery

## 2013-08-23 ENCOUNTER — Encounter (INDEPENDENT_AMBULATORY_CARE_PROVIDER_SITE_OTHER): Payer: Self-pay | Admitting: Surgery

## 2013-08-23 ENCOUNTER — Ambulatory Visit (INDEPENDENT_AMBULATORY_CARE_PROVIDER_SITE_OTHER): Payer: Commercial Managed Care - HMO | Admitting: Surgery

## 2013-08-23 VITALS — BP 146/84 | HR 76 | Temp 97.2°F | Resp 18 | Ht 71.0 in | Wt 185.8 lb

## 2013-08-23 DIAGNOSIS — K432 Incisional hernia without obstruction or gangrene: Secondary | ICD-10-CM

## 2013-08-23 NOTE — Patient Instructions (Signed)
Please consider the recommendations that we have given you today:  Consider surgery to repair the hernias in her abdomen.  Probable combined minimally invasive/laparoscopic/open approach  See the Handout(s) we have given you.  Please call our office at [phone] if you wish to schedule surgery or if you have further questions / concerns.   Hernia A hernia occurs when an internal organ pushes out through a weak spot in the abdominal wall. Hernias most commonly occur in the groin and around the navel. Hernias often can be pushed back into place (reduced). Most hernias tend to get worse over time. Some abdominal hernias can get stuck in the opening (irreducible or incarcerated hernia) and cannot be reduced. An irreducible abdominal hernia which is tightly squeezed into the opening is at risk for impaired blood supply (strangulated hernia). A strangulated hernia is a medical emergency. Because of the risk for an irreducible or strangulated hernia, surgery may be recommended to repair a hernia. CAUSES   Heavy lifting.  Prolonged coughing.  Straining to have a bowel movement.  A cut (incision) made during an abdominal surgery. HOME CARE INSTRUCTIONS   Bed rest is not required. You may continue your normal activities.  Avoid lifting more than 10 pounds (4.5 kg) or straining.  Cough gently. If you are a smoker it is best to stop. Even the best hernia repair can break down with the continual strain of coughing. Even if you do not have your hernia repaired, a cough will continue to aggravate the problem.  Do not wear anything tight over your hernia. Do not try to keep it in with an outside bandage or truss. These can damage abdominal contents if they are trapped within the hernia sac.  Eat a normal diet.  Avoid constipation. Straining over long periods of time will increase hernia size and encourage breakdown of repairs. If you cannot do this with diet alone, stool softeners may be  used. SEEK IMMEDIATE MEDICAL CARE IF:   You have a fever.  You develop increasing abdominal pain.  You feel nauseous or vomit.  Your hernia is stuck outside the abdomen, looks discolored, feels hard, or is tender.  You have any changes in your bowel habits or in the hernia that are unusual for you.  You have increased pain or swelling around the hernia.  You cannot push the hernia back in place by applying gentle pressure while lying down. MAKE SURE YOU:   Understand these instructions.  Will watch your condition.  Will get help right away if you are not doing well or get worse. Document Released: 01/12/2005 Document Revised: 04/06/2011 Document Reviewed: 09/01/2007 [facility] Patient Information 2015 [place], [place]. This information is not intended to replace advice given to you by your health care provider. Make sure you discuss any questions you have with your health care provider.  HERNIA REPAIR: POST OP INSTRUCTIONS  1. DIET: Follow a light bland diet the first 24 hours after arrival home, such as soup, liquids, crackers, etc.  Be sure to include lots of fluids daily.  Avoid fast food or heavy meals as your are more likely to get nauseated.  Eat a low fat the next few days after surgery. 2. Take your usually prescribed home medications unless otherwise directed. 3. PAIN CONTROL: a. Pain is best controlled by a usual combination of three different methods TOGETHER: i. Ice/Heat ii. Over the counter pain medication iii. Prescription pain medication b. Most patients will experience some swelling and bruising around the hernia(s)  such as the bellybutton, groins, or old incisions.  Ice packs or heating pads (30-60 minutes up to 6 times a day) will help. Use ice for the first few days to help decrease swelling and bruising, then switch to heat to help relax tight/sore spots and speed recovery.  Some people prefer to use ice alone, heat alone, alternating between ice & heat.   Experiment to what works for you.  Swelling and bruising can take several weeks to resolve.   c. It is helpful to take an over-the-counter pain medication regularly for the first few weeks.  Choose one of the following that works best for you: i. Naproxen (Aleve, etc)  Two 220mg  tabs twice a day ii. Ibuprofen (Advil, etc) Three 200mg  tabs four times a day (every meal & bedtime) iii. Acetaminophen (Tylenol, etc) 325-650mg  four times a day (every meal & bedtime) d. A  prescription for pain medication should be given to you upon discharge.  Take your pain medication as prescribed.  i. If you are having problems/concerns with the prescription medicine (does not control pain, nausea, vomiting, rash, itching, etc), please call us [phone] to see if we need to switch you to a different pain medicine that will work better for you and/or control your side effect better. ii. If you need a refill on your pain medication, please contact your pharmacy.  They will contact our office to request authorization. Prescriptions will not be filled after 5 pm or on week-ends. 4. Avoid getting constipated.  Between the surgery and the pain medications, it is common to experience some constipation.  Increasing fluid intake and taking a fiber supplement (such as Metamucil, Citrucel, FiberCon, MiraLax, etc) 1-2 times a day regularly will usually help prevent this problem from occurring.  A mild laxative (prune juice, Milk of Magnesia, MiraLax, etc) should be taken according to package directions if there are no bowel movements after 48 hours.   5. Wash / shower every day.  You may shower over the dressings as they are waterproof.   6. Remove your waterproof bandages 5 days after surgery.  You may leave the incision open to air.  You may replace a dressing/Band-Aid to cover the incision for comfort if you wish.  Continue to shower over incision(s) after the dressing is off.    7. ACTIVITIES as tolerated:   a. You may  resume regular (light) daily activities beginning the next day-such as daily self-care, walking, climbing stairs-gradually increasing activities as tolerated.  If you can walk 30 minutes without difficulty, it is safe to try more intense activity such as jogging, treadmill, bicycling, low-impact aerobics, swimming, etc. b. Save the most intensive and strenuous activity for last such as sit-ups, heavy lifting, contact sports, etc  Refrain from any heavy lifting or straining until you are off narcotics for pain control.   c. DO NOT PUSH THROUGH PAIN.  Let pain be your guide: If it hurts to do something, don't do it.  Pain is your body warning you to avoid that activity for another week until the pain goes down. d. You may drive when you are no longer taking prescription pain medication, you can comfortably wear a seatbelt, and you can safely maneuver your car and apply brakes. e. Dennis Bast may have sexual intercourse when it is comfortable.  8. FOLLOW UP in our office a. Please call [facility] at [phone] to set up an appointment to see your surgeon in the office for a follow-up appointment approximately 2-3  weeks after your surgery. b. Make sure that you call for this appointment the day you arrive home to insure a convenient appointment time. 9.  IF YOU HAVE DISABILITY OR FAMILY LEAVE FORMS, BRING THEM TO THE OFFICE FOR PROCESSING.  DO NOT GIVE THEM TO YOUR DOCTOR.  WHEN TO CALL us [phone]: 1. Poor pain control 2. Reactions / problems with new medications (rash/itching, nausea, etc)  3. Fever over 101.5 F (38.5 C) 4. Inability to urinate 5. Nausea and/or vomiting 6. Worsening swelling or bruising 7. Continued bleeding from incision. 8. Increased pain, redness, or drainage from the incision   The clinic staff is available to answer your questions during regular business hours (8:30am-5pm).  Please don't hesitate to call and ask to speak to one of our nurses for clinical concerns.   If you have a  medical emergency, go to the nearest emergency room or call 911.  A surgeon from [facility] is always on call at the hospitals in [facility], [place], [place], [place], [place]  [address] ?  [address] [address], [place], [place]   [address] MAIN: [phone] ? TOLL FREE: [phone] ? FAX: [phone] [email or web address]

## 2013-08-23 NOTE — Progress Notes (Signed)
Subjective:     Patient ID: Preston Reeves, male   DOB: 08/22/1940, 73 y.o.   MRN: [id number]  HPI  Note: Portions of this report may have been transcribed using voice recognition software. Every effort was made to ensure accuracy; however, inadvertent computerized transcription errors may be present.   Any transcriptional errors that result from this process are unintentional.            Preston Reeves  08/16/1940 [id number]  Patient Care Team: Darlyne Russian, MD as PCP - General (Family Medicine) Raynelle Bring, MD as Consulting Physician (Urology) Irene Shipper, MD as Consulting Physician (Gastroenterology) Darlin Coco, MD as Consulting Physician (Cardiology)  This patient is a 73 y.o.male who presents today for surgical evaluation at the request of Dr. Everlene Farrier.   Reason for visit: Incisional hernias  Pleasant active male.  Comes today with his wife.  Had perforated sigmoid diverticulitis requiring emergency sigmoid colectomy and colostomy July 2010.  Developed a wound infection.  Had colostomy takedown a few months later.  Followup colonoscopy next year benign  He has noted swelling in his incision for several years.  Reestablished with a new primary care physician.  Dr. Lyman Bishop concern for incisional hernia.  Surgical consultation recommended.  The patient does note that the incision has gotten larger over time.  It is sensitive for some discomfort but no severe pain.  Has daily bowel movements.  No nausea or vomiting.  He golfs 18 holes twice a week.  Has not smoked in over 10 years.  Had left lumbar hernia repair as only other surgery besides above.  Patient Active Problem List   Diagnosis Date Noted  . Incisional hernia, without obstruction or gangrene 06/20/2013  . Other postablative hypothyroidism 12/28/2012  . Dyspnea on exertion 06/16/2010  . MVP (mitral valve prolapse)   . Diverticulosis   . History of BPH   . Hemochromatosis   . History of COPD     Past Medical  History  Diagnosis Date  . MVP (mitral valve prolapse)   . Diverticulosis     hx of  . History of BPH   . Hemochromatosis     hx of  . History of COPD   . Thyroid disease   . Incisional hernia   . Hyperlipidemia   . Diverticulitis of colon with perforation 2010    Colectomy/ostomy  . Thyrotoxicosis   . Colon polyps     Adenomatous    Past Surgical History  Procedure Laterality Date  . Inguinal hernia repair  1999    Dr. Rebekah Chesterfield  . Partial colectomy  08/12/2008    Jeanette Caprice  . Hernia repair    . Colostomy takedown  2010    Dr Barry Dienes  . Colonoscopy w/ biopsies  2011    Dr Henrene Pastor    History   Social History  . Marital Status: Married    Spouse Name: N/A    Number of Children: N/A  . Years of Education: N/A   Occupational History  . Retired    Social History Main Topics  . Smoking status: Former Smoker    Types: Cigarettes  . Smokeless tobacco: Not on file  . Alcohol Use: Yes     Comment: 7drinks   . Drug Use: No  . Sexual Activity: Yes   Other Topics Concern  . Not on file   Social History Narrative   Married. Education: [employer].    Family History  Problem Relation Age of Onset  .  Heart disease Mother   . Cancer Mother     breast  . Cancer Father     colon  . Heart disease Father   . Diabetes Father   . Colon cancer      parent    Current Outpatient Prescriptions  Medication Sig Dispense Refill  . Ascorbic Acid (VITAMIN C) 1000 MG tablet Take 1,000 mg by mouth daily.        Marland Kitchen aspirin 81 MG tablet Take 81 mg by mouth 2 (two) times daily.       . Biotin 10 MG CAPS Take by mouth once.      . Calcium Carbonate-Vitamin D (CALCIUM + D PO) Take by mouth daily.      . Cholecalciferol (VITAMIN D) 2000 UNITS CAPS Take by mouth.        . Cyanocobalamin (VITAMIN B 12 PO) Take by mouth. Taking 1000 daily       . fexofenadine-pseudoephedrine (ALLEGRA-D 24) 180-240 MG per 24 hr tablet Take 1 tablet by mouth daily.      Marland Kitchen glucosamine-chondroitin 500-400 MG  tablet Take 1 tablet by mouth 3 (three) times daily.      Marland Kitchen loratadine (CLARITIN) 10 MG tablet Take 10 mg by mouth daily. As needed       . Methylcellulose, Laxative, (CITRUCEL PO) Take by mouth daily.      . Multiple Vitamins-Minerals (MULTIVITAMIN PO) Take by mouth.      . Omega-3 Fatty Acids (FISH OIL PO) Take by mouth. Taking 900 daily       . Red Yeast Rice Extract (RED YEAST RICE PO) Take by mouth. Taking 2 daily of 600 mg       . SALINE NASAL SPRAY NA Place into the nose daily.      Marland Kitchen SYNTHROID 137 MCG tablet Take 1 tablet (137 mcg total) by mouth daily before breakfast.  90 tablet  3   No current facility-administered medications for this visit.     No Known Allergies  BP 146/84  Pulse 76  Temp(Src) 97.2 F (36.2 C) (Oral)  Resp 18  Ht 5\' 11"  (1.803 m)  Wt 185 lb 12.8 oz (84.278 kg)  BMI 25.93 kg/m2  No results found.   Review of Systems  Constitutional: Negative for fever, chills and diaphoresis.  HENT: Negative for ear discharge, facial swelling, mouth sores, nosebleeds, sore throat and trouble swallowing.   Eyes: Negative for photophobia, discharge and visual disturbance.  Respiratory: Negative for choking, chest tightness, shortness of breath and stridor.   Cardiovascular: Negative for chest pain and palpitations.  Gastrointestinal: Negative for nausea, vomiting, abdominal pain, diarrhea, constipation, blood in stool, abdominal distention, anal bleeding and rectal pain.  Endocrine: Negative for cold intolerance and heat intolerance.  Genitourinary: Negative for dysuria, urgency, difficulty urinating and testicular pain.  Musculoskeletal: Negative for arthralgias, back pain, gait problem and myalgias.  Skin: Negative for color change, pallor, rash and wound.  Allergic/Immunologic: Negative for environmental allergies and food allergies.  Neurological: Negative for dizziness, speech difficulty, weakness, numbness and headaches.  Hematological: Negative for adenopathy.  Does not bruise/bleed easily.  Psychiatric/Behavioral: Negative for hallucinations, confusion and agitation.       Objective:   Physical Exam  Constitutional: He is oriented to person, place, and time. He appears well-developed and well-nourished. No distress.  HENT:  Head: Normocephalic.  Mouth/Throat: Oropharynx is clear and moist. No oropharyngeal exudate.  Eyes: Conjunctivae and EOM are normal. Pupils are equal, round, and reactive to light.  No scleral icterus.  Neck: Normal range of motion. Neck supple. No tracheal deviation present.  Cardiovascular: Normal rate, regular rhythm and intact distal pulses.   Pulmonary/Chest: Effort normal and breath sounds normal. No respiratory distress.  Abdominal: Soft. He exhibits no distension. There is no tenderness. There is no rigidity, no rebound, no guarding, no tenderness at McBurney's point and negative Murphy's sign. A hernia is present. Hernia confirmed positive in the ventral area. Hernia confirmed negative in the right inguinal area and confirmed negative in the left inguinal area.    Musculoskeletal: Normal range of motion. He exhibits no tenderness.  Lymphadenopathy:    He has no cervical adenopathy.       Right: No inguinal adenopathy present.       Left: No inguinal adenopathy present.  Neurological: He is alert and oriented to person, place, and time. No cranial nerve deficit. He exhibits normal muscle tone. Coordination normal.  Skin: Skin is warm and dry. No rash noted. He is not diaphoretic. No erythema. No pallor.  Psychiatric: He has a normal mood and affect. His behavior is normal. Judgment and thought content normal.       Assessment:     Multiple incisional hernias largest suprapubic.  Also containing small bowel.  Reducible.     Plan:     I think he would benefit from surgical repair.  I expect starting out laparoscopically with a low threshold to have a combined laparoscopic underlay/open primary repair over the  hernias.  We will try to avoid component separation.  Most likely will need to take down the bladder as well for good overlap.  Would approach from right upper quadrant to stay away from prior emergency Hartmann procedure and left lower quadrant.  He has good exercise tolerance and recently seen by his primary care physician/cardiologist.  They are interested in proceeding.  I discussed with them:  The anatomy & physiology of the abdominal wall was discussed.  The pathophysiology of hernias was discussed.  Natural history risks without surgery including progeressive enlargement, pain, incarceration & strangulation was discussed.   Contributors to complications such as smoking, obesity, diabetes, prior surgery, etc were discussed.   I feel the risks of no intervention will lead to serious problems that outweigh the operative risks; therefore, I recommended surgery to reduce and repair the hernia.  I explained laparoscopic techniques with possible need for an open approach.  I noted the probable use of mesh to patch and/or buttress the hernia repair  Risks such as bleeding, infection, abscess, need for further treatment, heart attack, death, and other risks were discussed.  I noted a good likelihood this will help address the problem.   Goals of post-operative recovery were discussed as well.  Possibility that this will not correct all symptoms was explained.  I stressed the importance of low-impact activity, aggressive pain control, avoiding constipation, & not pushing through pain to minimize risk of post-operative chronic pain or injury. Possibility of reherniation especially with smoking, obesity, diabetes, immunosuppression, and other health conditions was discussed.  We will work to minimize complications.     An educational handout further explaining the pathology & treatment options was given as well.  Questions were answered.  The patient expresses understanding & wishes to proceed with surgery.

## 2013-08-29 ENCOUNTER — Ambulatory Visit (INDEPENDENT_AMBULATORY_CARE_PROVIDER_SITE_OTHER): Payer: Commercial Managed Care - HMO | Admitting: Internal Medicine

## 2013-08-29 ENCOUNTER — Encounter: Payer: Self-pay | Admitting: Internal Medicine

## 2013-08-29 VITALS — BP 138/72 | HR 84 | Ht 69.5 in | Wt 186.2 lb

## 2013-08-29 DIAGNOSIS — Z86010 Personal history of colonic polyps: Secondary | ICD-10-CM

## 2013-08-29 DIAGNOSIS — K432 Incisional hernia without obstruction or gangrene: Secondary | ICD-10-CM

## 2013-08-29 DIAGNOSIS — R195 Other fecal abnormalities: Secondary | ICD-10-CM

## 2013-08-29 MED ORDER — MOVIPREP 100 G PO SOLR: 1.0000 | Freq: Once | ORAL | Status: DC

## 2013-08-29 NOTE — Patient Instructions (Signed)

## 2013-08-29 NOTE — Progress Notes (Signed)
HISTORY OF PRESENT ILLNESS:  Preston Reeves is a 73 y.o. male with past medical history as listed below. He is sent today regarding Hemoccult-positive stool on routine annual physical exam with his new PCP. Review of outside blood work findings normal comprehensive metabolic panel. Normal CBC with hemoglobin of 13.3. Patient does have a history of adenomatous colon polyps as well as colon cancer in his parent around age 38. Previous colonoscopies have been performed in 2003, 2005, 2008, and 2011. At the time of his last examination, no polyps. Followup in 5 years recommended. He has had prior segmental colectomy in the sigmoid colon for diverticular disease. The anastomosis was somewhat irregular with negative biopsies. His GI review of systems is remarkable for intermittent right lower quadrant discomfort which has been attributed to his hernia. He has seen a Education officer, environmental, Dr. Johney Maine, recently regarding hernia repair. He takes baby aspirin twice daily. No other NSAIDs. His chronic medical problems have been stable.  REVIEW OF SYSTEMS:  All non-GI ROS negative except for sinus and allergy trouble, back pain  Past Medical History  Diagnosis Date  . MVP (mitral valve prolapse)   . Diverticulosis     hx of  . History of BPH   . Hemochromatosis     hx of  . History of COPD   . Thyroid disease   . Incisional hernia   . Hyperlipidemia   . Diverticulitis of colon with perforation 2010    Colectomy/ostomy  . Thyrotoxicosis   . Colon polyps     Adenomatous    Past Surgical History  Procedure Laterality Date  . Inguinal hernia repair Left 1999    Dr. Rebekah Chesterfield  . Partial colectomy  08/12/2008    Jeanette Caprice  . Colostomy takedown  2010    Dr Barry Dienes  . Colonoscopy w/ biopsies  2011    Dr Henrene Pastor    Social History [name]  reports that he has quit smoking. His smoking use included Cigarettes. He smoked 0.00 packs per day. He has never used smokeless tobacco. He reports that he drinks  alcohol. He reports that he does not use illicit drugs.  family history includes Breast cancer in his mother; Colon cancer in his father; Diabetes in his father; Heart disease in his father.  No Known Allergies     PHYSICAL EXAMINATION: Vital signs: BP 138/72  Pulse 84  Ht 5' 9.5" (1.765 m)  Wt 186 lb 3.2 oz (84.46 kg)  BMI 27.11 kg/m2  Constitutional: generally well-appearing, no acute distress Psychiatric: alert and oriented x3, cooperative Eyes: extraocular movements intact, anicteric, conjunctiva pink Mouth: oral pharynx moist, no lesions Neck: supple no lymphadenopathy Cardiovascular: heart regular rate and rhythm, no murmur Lungs: clear to auscultation bilaterally Abdomen: soft, nontender, nondistended, no obvious ascites, no peritoneal signs, normal bowel sounds, no organomegaly. Large incisional hernia which can be manually reduced Rectal: Deferred until colonoscopy Extremities: no lower ex deferred until colonoscopy tremity edema bilaterally Skin: no lesions on visible extremities Neuro: No focal deficits.   ASSESSMENT:  #1. Hemoccult-positive stool #2. History of adenomatous colon polyps and family history of colon cancer. Last examination 2011 as outlined #3. History of complicated diverticular disease status post sigmoid colectomy #4. Large ventral hernia with intermittent abdominal discomfort. Plans for surgical repair forthcoming #5. General medical problems. Stable   PLAN:  #1. Colonoscopy and upper endoscopy to evaluate Hemoccult-positive stool. Colonoscopy maybe somewhat more difficult given his ventral hernia. He understands.The nature of the procedure, as well as  the risks, benefits, and alternatives were carefully and thoroughly reviewed with the patient. Ample time for discussion and questions allowed. The patient understood, was satisfied, and agreed to proceed. Movi prep prescribed. The patient instructed on its use.

## 2013-09-06 ENCOUNTER — Encounter: Payer: Self-pay | Admitting: Internal Medicine

## 2013-09-25 ENCOUNTER — Encounter: Payer: Self-pay | Admitting: Internal Medicine

## 2013-09-25 ENCOUNTER — Ambulatory Visit (AMBULATORY_SURGERY_CENTER): Payer: Commercial Managed Care - HMO | Admitting: Internal Medicine

## 2013-09-25 VITALS — BP 150/86 | HR 87 | Temp 96.2°F | Resp 16 | Ht 69.5 in | Wt 186.0 lb

## 2013-09-25 DIAGNOSIS — Z86010 Personal history of colonic polyps: Secondary | ICD-10-CM

## 2013-09-25 DIAGNOSIS — D122 Benign neoplasm of ascending colon: Secondary | ICD-10-CM

## 2013-09-25 DIAGNOSIS — D126 Benign neoplasm of colon, unspecified: Secondary | ICD-10-CM

## 2013-09-25 DIAGNOSIS — R195 Other fecal abnormalities: Secondary | ICD-10-CM

## 2013-09-25 DIAGNOSIS — D123 Benign neoplasm of transverse colon: Secondary | ICD-10-CM

## 2013-09-25 DIAGNOSIS — Z800 Family history of malignant neoplasm of digestive organs: Secondary | ICD-10-CM

## 2013-09-25 MED ORDER — SODIUM CHLORIDE 0.9 % IV SOLN: 500.0000 mL | INTRAVENOUS | Status: DC

## 2013-09-25 NOTE — Patient Instructions (Signed)
YOU HAD AN ENDOSCOPIC PROCEDURE TODAY AT THE [facility]: Refer to the procedure report that was given to you for any specific questions about what was found during the examination.  If the procedure report does not answer your questions, please call your gastroenterologist to clarify.  If you requested that your care partner not be given the details of your procedure findings, then the procedure report has been included in a sealed envelope for you to review at your convenience later.  YOU SHOULD EXPECT: Some feelings of bloating in the abdomen. Passage of more gas than usual.  Walking can help get rid of the air that was put into your GI tract during the procedure and reduce the bloating. If you had a lower endoscopy (such as a colonoscopy or flexible sigmoidoscopy) you may notice spotting of blood in your stool or on the toilet paper. If you underwent a bowel prep for your procedure, then you may not have a normal bowel movement for a few days.  DIET: Your first meal following the procedure should be a light meal and then it is ok to progress to your normal diet.  A half-sandwich or bowl of soup is an example of a good first meal.  Heavy or fried foods are harder to digest and may make you feel nauseous or bloated.  Likewise meals heavy in dairy and vegetables can cause extra gas to form and this can also increase the bloating.  Drink plenty of fluids but you should avoid alcoholic beverages for 24 hours.  ACTIVITY: Your care partner should take you home directly after the procedure.  You should plan to take it easy, moving slowly for the rest of the day.  You can resume normal activity the day after the procedure however you should NOT DRIVE or use heavy machinery for 24 hours (because of the sedation medicines used during the test).    SYMPTOMS TO REPORT IMMEDIATELY: A gastroenterologist can be reached at any hour.  During normal business hours, 8:30 AM to 5:00 PM Monday through Friday,  call [phone].  After hours and on weekends, please call the GI answering service at [phone] who will take a message and have the physician on call contact you.   Following lower endoscopy (colonoscopy or flexible sigmoidoscopy):  Excessive amounts of blood in the stool  Significant tenderness or worsening of abdominal pains  Swelling of the abdomen that is new, acute  Fever of 100F or higher  Following upper endoscopy (EGD)  Vomiting of blood or coffee ground material  New chest pain or pain under the shoulder blades  Painful or persistently difficult swallowing  New shortness of breath  Fever of 100F or higher  Black, tarry-looking stools  FOLLOW UP: If any biopsies were taken you will be contacted by phone or by letter within the next 1-3 weeks.  Call your gastroenterologist if you have not heard about the biopsies in 3 weeks.  Our staff will call the home number listed on your records the next business day following your procedure to check on you and address any questions or concerns that you may have at that time regarding the information given to you following your procedure. This is a courtesy call and so if there is no answer at the home number and we have not heard from you through the emergency physician on call, we will assume that you have returned to your regular daily activities without incident.  SIGNATURES/CONFIDENTIALITY: You and/or your care   partner have signed paperwork which will be entered into your electronic medical record.  These signatures attest to the fact that that the information above on your After Visit Summary has been reviewed and is understood.  Full responsibility of the confidentiality of this discharge information lies with you and/or your care-partner.  

## 2013-09-25 NOTE — Op Note (Signed)
[facility]  [address] [place], [address]   ENDOSCOPY PROCEDURE REPORT  PATIENT: Preston Reeves, Preston Reeves  MR#: [id number] BIRTHDATE: 12/17/1940 , 73  yrs. old GENDER: Male ENDOSCOPIST: Eustace Quail, MD REFERRED BY:  [name], M.D. PROCEDURE DATE:  09/25/2013 PROCEDURE:  EGD, diagnostic ASA CLASS:     Class II INDICATIONS:  Heme positive stool. MEDICATIONS: MAC sedation, administered by CRNA and propofol (Diprivan) 170mg  IV TOPICAL ANESTHETIC: none  DESCRIPTION OF PROCEDURE: After the risks benefits and alternatives of the procedure were thoroughly explained, informed consent was obtained.  The LB [id number] endoscope was introduced through the mouth and advanced to the second portion of the duodenum. Without limitations.  The instrument was slowly withdrawn as the mucosa was fully examined.      EXAM:The upper, middle and distal third of the esophagus were carefully inspected and no abnormalities were noted.  The z-line was well seen at the GEJ.  The endoscope was pushed into the fundus which was normal including a retroflexed view.  The antrum, gastric body, first and second part of the duodenum were unremarkable. Retroflexed views revealed no abnormalities.     The scope was then withdrawn from the patient and the procedure completed.  COMPLICATIONS: There were no complications. ENDOSCOPIC IMPRESSION: 1. Normal EGD  RECOMMENDATIONS: 1. Return to the care of your primary providers. GI followup as needed.  REPEAT EXAM:  eSigned:  Eustace Quail, MD 09/25/2013 4:09 PM   YI:RSWNIO Everlene Farrier, MD and Michael Boston, MD

## 2013-09-25 NOTE — Op Note (Signed)
[facility]  [address] [place], [address]   COLONOSCOPY PROCEDURE REPORT  PATIENT: Preston, Reeves  MR#: [id number] BIRTHDATE: [birth year] , 73  yrs. old GENDER: Male ENDOSCOPIST: Eustace Quail, MD REFERRED ZS:MOLMBE Everlene Farrier, M.D. PROCEDURE DATE:  09/25/2013 PROCEDURE:   Colonoscopy with snare polypectomy x 3 First Screening Colonoscopy - Avg.  risk and is 50 yrs.  old or older - No.  Prior Negative Screening - Now for repeat screening. N/A  History of Adenoma - Now for follow-up colonoscopy & has been > or = to 3 yrs.  Yes hx of adenoma.  Has been 3 or more years since last colonoscopy.  Polyps Removed Today? Yes. ASA CLASS:   Class II INDICATIONS:heme-positive stool, Patient's immediate family history of colon cancer(parent 16s), and Patient's personal history of adenomatous colon polyps. Multiple prior exams 2003, 2005, 2008, 2011 (-) MEDICATIONS: MAC sedation, administered by CRNA and propofol (Diprivan) 330mg  IV DESCRIPTION OF PROCEDURE:   After the risks benefits and alternatives of the procedure were thoroughly explained, informed consent was obtained.  A digital rectal exam revealed no abnormalities of the rectum.   The LB [id number]  endoscope was introduced through the anus and advanced to the cecum, which was identified by both the appendix and ileocecal valve. No adverse events experienced.   The quality of the prep was good, using MoviPrep  The instrument was then slowly withdrawn as the colon was fully examined.  COLON FINDINGS: Three diminutive polyps were found in the ascending (2) and transverse colon.  A polypectomy was performed with a cold snare.  The resection was complete and the polyp tissue was completely retrieved.   There was evidence of a prior colo-colonic surgical anastomosis in the sigmoid colon.   Diverticulosis was noted.  Retroflexed views revealed no abnormalities. The time to cecum=2 minutes 10 seconds.  Withdrawal  time=16 minutes 17 seconds. The scope was withdrawn and the procedure completed. COMPLICATIONS: There were no complications.  ENDOSCOPIC IMPRESSION: 1.   Three diminutive polyps were found in the colon; polypectomy was performed with a cold snare 2.   There was evidence of a prior colo-colonic surgical anastomosis in the sigmoid colon 3.   Diverticulosis RECOMMENDATIONS: 1.  Follow up colonoscopy in 5 years 2.  Upper endoscopy today (see report)   eSigned:  Eustace Quail, MD 09/25/2013 4:06 PM   cc: Preston Queen, MD, Preston Boston, MD, and The Patient

## 2013-09-25 NOTE — Progress Notes (Signed)
Called to room to assist during endoscopic procedure.  Patient ID and intended procedure confirmed with present staff. Received instructions for my participation in the procedure from the performing physician.  

## 2013-09-25 NOTE — Progress Notes (Signed)
Procedure ends, to recovery, report given and VSS. 

## 2013-09-26 ENCOUNTER — Telehealth: Payer: Self-pay | Admitting: *Deleted

## 2013-09-26 NOTE — Telephone Encounter (Signed)
  Follow up Call-  Call back number 09/25/2013  Post procedure Call Back phone  # [phone]  Permission to leave phone message Yes     Patient questions:  Do you have a fever, pain , or abdominal swelling? No. Pain Score  0 *  Have you tolerated food without any problems? Yes.    Have you been able to return to your normal activities? Yes.    Do you have any questions about your discharge instructions: Diet   No. Medications  No. Follow up visit  No.  Do you have questions or concerns about your Care? No.  Actions: * If pain score is 4 or above: No action needed, pain <4.

## 2013-09-28 ENCOUNTER — Encounter: Payer: Self-pay | Admitting: Internal Medicine

## 2013-10-04 ENCOUNTER — Encounter (HOSPITAL_COMMUNITY): Payer: Self-pay | Admitting: Pharmacy Technician

## 2013-10-05 NOTE — Pre-Procedure Instructions (Signed)
Preston Reeves  10/05/2013   Your procedure is scheduled on:  Friday October 13, 2013 at 7:45 AM.  Report to [facility] Admitting at 5:30 AM.  Call this number if you have problems the morning of surgery: [phone]   Remember:   Do not eat food or drink liquids after midnight.   Take these medicines the morning of surgery with A SIP OF WATER: Allegra, Claritin if needed, Nasal spray, and Synthroid   Discontinue herbal medications 5 days prior to surgery (Ex. All vitamins)    Do not wear jewelry.  Do not wear lotions, powders, or cologne.  Men may shave face and neck.  Do not bring valuables to the hospital.  [facility] is not responsible for any belongings or valuables.               Contacts, dentures or bridgework may not be worn into surgery.  Leave suitcase in the car. After surgery it may be brought to your room.  For patients admitted to the hospital, discharge time is determined by your treatment team.               Patients discharged the day of surgery will not be allowed to drive home.  Name and phone number of your driver: Family/Friend  Special Instructions: Shower using CHG soap the night before and the morning of your surgery   Please read over the following fact sheets that you were given: Pain Booklet, Coughing and Deep Breathing and Surgical Site Infection Prevention

## 2013-10-06 ENCOUNTER — Encounter (HOSPITAL_COMMUNITY): Payer: Self-pay

## 2013-10-06 ENCOUNTER — Encounter (HOSPITAL_COMMUNITY)
Admission: RE | Admit: 2013-10-06 | Discharge: 2013-10-06 | Disposition: A | Discharge status: Home / Self Care | Payer: Medicare PPO | Source: Ambulatory Visit | Attending: Surgery | Admitting: Surgery

## 2013-10-06 ENCOUNTER — Other Ambulatory Visit (INDEPENDENT_AMBULATORY_CARE_PROVIDER_SITE_OTHER): Payer: Self-pay | Admitting: Surgery

## 2013-10-06 DIAGNOSIS — Z01812 Encounter for preprocedural laboratory examination: Secondary | ICD-10-CM | POA: Insufficient documentation

## 2013-10-06 DIAGNOSIS — K432 Incisional hernia without obstruction or gangrene: Secondary | ICD-10-CM | POA: Insufficient documentation

## 2013-10-06 DIAGNOSIS — Z01810 Encounter for preprocedural cardiovascular examination: Secondary | ICD-10-CM | POA: Insufficient documentation

## 2013-10-06 DIAGNOSIS — R221 Localized swelling, mass and lump, neck: Secondary | ICD-10-CM

## 2013-10-06 HISTORY — DX: Hypothyroidism, unspecified: E03.9

## 2013-10-06 LAB — CBC
HEMATOCRIT: 40.5 % (ref 39.0–52.0)
HEMOGLOBIN: 13.4 g/dL (ref 13.0–17.0)
MCH: 29.2 pg (ref 26.0–34.0)
MCHC: 33.1 g/dL (ref 30.0–36.0)
MCV: 88.2 fL (ref 78.0–100.0)
Platelets: 249 10*3/uL (ref 150–400)
RBC: 4.59 MIL/uL (ref 4.22–5.81)
RDW: 14.3 % (ref 11.5–15.5)
WBC: 4.3 10*3/uL (ref 4.0–10.5)

## 2013-10-06 LAB — BASIC METABOLIC PANEL
Anion gap: 11 (ref 5–15)
BUN: 14 mg/dL (ref 6–23)
CO2: 26 mEq/L (ref 19–32)
CREATININE: 0.85 mg/dL (ref 0.50–1.35)
Calcium: 9.6 mg/dL (ref 8.4–10.5)
Chloride: 104 mEq/L (ref 96–112)
GFR, EST NON AFRICAN AMERICAN: 84 mL/min — AB (ref >90)
GLUCOSE: 90 mg/dL (ref 70–99)
Potassium: 3.9 mEq/L (ref 3.7–5.3)
Sodium: 141 mEq/L (ref 137–147)

## 2013-10-06 NOTE — Progress Notes (Signed)
CXR with neck mass - Neck Xray series recommended by radiology  Adin Hector, M.D., F.A.C.S. Gastrointestinal and Minimally Invasive Surgery [facility] [address], [place], [place] [address] [phone] Main / Paging

## 2013-10-06 NOTE — Progress Notes (Signed)
PCP is Dr. Everlene Farrier and Cardiologist is Dr. Mare Ferrari. Patient denied having any chest pain, discomfort, or shortness of breath. Wife at chairside during PAT visit.

## 2013-10-09 NOTE — Progress Notes (Signed)
Anesthesia Chart Review:  Pt is 73 year old male scheduled for laparoscopic open exploration and repair of hernias in abdominal wall, insertion of mesh on 10/13/13 with Dr. Johney Maine.   PMH: MVP, COPD, hx thyrotoxicosis tx and is now hypothyroid, hyperlipidemia, hemochromatosis, hernia.   Preoperative labs reviewed.  Pt has hx of hemochromatosis, last iron and TIBC 12/2012. At that time Dr. Marin Olp noted iron was low and did not recommend phlebotomy. Scheduled to be rechecked in 12/2013. Last TSH 05/2013 was normal.   Chest x-ray reviewed.  1. Tubular soft tissue density noted projected over the upper  trachea. To evaluate for soft tissue mass a soft tissue neck series is suggested. This series was ordered by Dr. Johney Maine.  2. COPD. No acute cardiopulmonary disease. Stable calcified pleural plaques.  EKG from 07/12/13 at Dr. Sherryl Barters office. EKG shows normal sinus rhythm and slow R-wave progression across precordium. No change since 07/04/12 per Dr. Mare Ferrari.   Dr. Sherryl Barters note dated 07/12/13 notes he is aware of pt seeking surgical consult with Dr. Johney Maine for hernia in abdomen.   If no changes, I anticipate pt can proceed with surgery as scheduled.   Willeen Cass, FNP-BC [facility]/Anesthesiology Phone: [phone] 10/09/2013 2:39 PM

## 2013-10-12 MED ORDER — METRONIDAZOLE IN NACL 5-0.79 MG/ML-% IV SOLN
500.0000 mg | INTRAVENOUS | Status: AC
  Administered 2013-10-13: .5 g via INTRAVENOUS
  Filled 2013-10-12: qty 100

## 2013-10-12 MED ORDER — BUPIVACAINE 0.25 % ON-Q PUMP DUAL CATH 300 ML
300.0000 mL | INJECTION | Status: DC
  Filled 2013-10-12 (×2): qty 300

## 2013-10-12 MED ORDER — CEFAZOLIN SODIUM-DEXTROSE 2-3 GM-% IV SOLR
2.0000 g | INTRAVENOUS | Status: AC
  Administered 2013-10-13 (×2): 2 g via INTRAVENOUS
  Filled 2013-10-12: qty 50

## 2013-10-12 NOTE — Anesthesia Preprocedure Evaluation (Addendum)
Anesthesia Evaluation  Patient identified by MRN, date of birth, ID band Patient awake    Reviewed: Allergy & Precautions, H&P , NPO status , Patient's Chart, lab work & pertinent test results  History of Anesthesia Complications Negative for: history of anesthetic complications  Airway Mallampati: I TM Distance: >3 FB Neck ROM: Full    Dental  (+) Teeth Intact, Dental Advisory Given   Pulmonary COPDformer smoker,  breath sounds clear to auscultation        Cardiovascular - angina+ Valvular Problems/Murmurs MVP Rhythm:Regular     Neuro/Psych negative neurological ROS     GI/Hepatic negative GI ROS, Neg liver ROS,   Endo/Other  Hypothyroidism   Renal/GU negative Renal ROS     Musculoskeletal   Abdominal   Peds  Hematology hemachromatosis   Anesthesia Other Findings   Reproductive/Obstetrics                        Anesthesia Physical Anesthesia Plan  ASA: III  Anesthesia Plan: General   Post-op Pain Management:    Induction: Intravenous  Airway Management Planned: Oral ETT  Additional Equipment: None  Intra-op Plan:   Post-operative Plan: Extubation in OR  Informed Consent: I have reviewed the patients History and Physical, chart, labs and discussed the procedure including the risks, benefits and alternatives for the proposed anesthesia with the patient or authorized representative who has indicated his/her understanding and acceptance.   Dental advisory given  Plan Discussed with: CRNA, Anesthesiologist and Surgeon  Anesthesia Plan Comments: (Plan routine monitors, GETA)      Anesthesia Quick Evaluation

## 2013-10-13 ENCOUNTER — Encounter (HOSPITAL_COMMUNITY)
Admission: RE | Disposition: A | Discharge status: Home / Self Care | Payer: Self-pay | Source: Ambulatory Visit | Attending: Surgery

## 2013-10-13 ENCOUNTER — Ambulatory Visit (HOSPITAL_COMMUNITY): Payer: Medicare PPO | Admitting: Anesthesiology

## 2013-10-13 ENCOUNTER — Observation Stay (HOSPITAL_COMMUNITY): Payer: Medicare PPO

## 2013-10-13 ENCOUNTER — Encounter (HOSPITAL_COMMUNITY): Payer: Medicare PPO | Admitting: Vascular Surgery

## 2013-10-13 ENCOUNTER — Inpatient Hospital Stay (HOSPITAL_COMMUNITY)
Admission: RE | Admit: 2013-10-13 | Discharge: 2013-10-17 | DRG: 336 | Disposition: A | Discharge status: Home / Self Care | Payer: Medicare PPO | Source: Ambulatory Visit | Attending: Surgery | Admitting: Surgery

## 2013-10-13 ENCOUNTER — Encounter (HOSPITAL_COMMUNITY): Payer: Self-pay | Admitting: *Deleted

## 2013-10-13 DIAGNOSIS — K432 Incisional hernia without obstruction or gangrene: Secondary | ICD-10-CM | POA: Diagnosis present

## 2013-10-13 DIAGNOSIS — E039 Hypothyroidism, unspecified: Secondary | ICD-10-CM | POA: Diagnosis present

## 2013-10-13 DIAGNOSIS — J4489 Other specified chronic obstructive pulmonary disease: Secondary | ICD-10-CM | POA: Diagnosis present

## 2013-10-13 DIAGNOSIS — K439 Ventral hernia without obstruction or gangrene: Principal | ICD-10-CM | POA: Diagnosis present

## 2013-10-13 DIAGNOSIS — K660 Peritoneal adhesions (postprocedural) (postinfection): Secondary | ICD-10-CM | POA: Diagnosis present

## 2013-10-13 DIAGNOSIS — K560 Paralytic ileus: Secondary | ICD-10-CM | POA: Diagnosis not present

## 2013-10-13 DIAGNOSIS — Z932 Ileostomy status: Secondary | ICD-10-CM

## 2013-10-13 DIAGNOSIS — J449 Chronic obstructive pulmonary disease, unspecified: Secondary | ICD-10-CM | POA: Diagnosis present

## 2013-10-13 HISTORY — PX: LAPAROSCOPIC LYSIS OF ADHESIONS: SHX5905

## 2013-10-13 HISTORY — PX: VENTRAL HERNIA REPAIR: SHX424

## 2013-10-13 HISTORY — PX: INSERTION OF MESH: SHX5868

## 2013-10-13 SURGERY — REPAIR, HERNIA, VENTRAL, LAPAROSCOPIC
Anesthesia: General | Site: Abdomen

## 2013-10-13 MED ORDER — METHOCARBAMOL 500 MG PO TABS
ORAL_TABLET | ORAL | Status: AC
  Administered 2013-10-13: 500 mg via ORAL
  Filled 2013-10-13: qty 1

## 2013-10-13 MED ORDER — KETOROLAC TROMETHAMINE 30 MG/ML IJ SOLN
INTRAMUSCULAR | Status: DC | PRN
Start: 2013-10-13 — End: 2013-10-13
  Administered 2013-10-13: 30 mg via INTRAVENOUS

## 2013-10-13 MED ORDER — METOPROLOL TARTRATE 12.5 MG HALF TABLET: 12.5000 mg | ORAL_TABLET | Freq: Two times a day (BID) | ORAL | Status: DC | PRN

## 2013-10-13 MED ORDER — LORAZEPAM 2 MG/ML IJ SOLN: 0.5000 mg | Freq: Three times a day (TID) | INTRAMUSCULAR | Status: DC | PRN

## 2013-10-13 MED ORDER — LEVOTHYROXINE SODIUM 137 MCG PO TABS
137.0000 ug | ORAL_TABLET | Freq: Every day | ORAL | Status: DC
  Administered 2013-10-14 – 2013-10-17 (×4): 137 ug via ORAL
  Filled 2013-10-13 (×5): qty 1

## 2013-10-13 MED ORDER — OXYCODONE HCL 5 MG PO TABS: 5.0000 mg | ORAL_TABLET | Freq: Once | ORAL | Status: DC | PRN

## 2013-10-13 MED ORDER — PHENOL 1.4 % MT LIQD: 2.0000 | OROMUCOSAL | Status: DC | PRN

## 2013-10-13 MED ORDER — SCOPOLAMINE 1 MG/3DAYS TD PT72
1.0000 | MEDICATED_PATCH | Freq: Once | TRANSDERMAL | Status: AC
  Administered 2013-10-13: 1.5 mg via TRANSDERMAL
  Filled 2013-10-13: qty 1

## 2013-10-13 MED ORDER — BUPIVACAINE-EPINEPHRINE (PF) 0.25% -1:200000 IJ SOLN
INTRAMUSCULAR | Status: AC
  Filled 2013-10-13: qty 30

## 2013-10-13 MED ORDER — ACETAMINOPHEN 500 MG PO TABS
1000.0000 mg | ORAL_TABLET | Freq: Three times a day (TID) | ORAL | Status: AC
  Administered 2013-10-13 – 2013-10-14 (×3): 1000 mg via ORAL
  Filled 2013-10-13 (×3): qty 2

## 2013-10-13 MED ORDER — SUFENTANIL CITRATE 50 MCG/ML IV SOLN
INTRAVENOUS | Status: DC | PRN
  Administered 2013-10-13 (×2): 5 ug via INTRAVENOUS
  Administered 2013-10-13: 20 ug via INTRAVENOUS
  Administered 2013-10-13: 5 ug via INTRAVENOUS

## 2013-10-13 MED ORDER — HYDROMORPHONE HCL 1 MG/ML IJ SOLN
0.5000 mg | INTRAMUSCULAR | Status: DC | PRN
  Administered 2013-10-13: 0.5 mg via INTRAVENOUS
  Filled 2013-10-13: qty 1

## 2013-10-13 MED ORDER — OXYCODONE HCL 5 MG PO TABS
5.0000 mg | ORAL_TABLET | ORAL | Status: DC | PRN
  Administered 2013-10-13 – 2013-10-17 (×13): 10 mg via ORAL
  Filled 2013-10-13 (×13): qty 2

## 2013-10-13 MED ORDER — SCOPOLAMINE 1 MG/3DAYS TD PT72
MEDICATED_PATCH | TRANSDERMAL | Status: DC | PRN
  Administered 2013-10-13: 1 via TRANSDERMAL

## 2013-10-13 MED ORDER — DEXAMETHASONE SODIUM PHOSPHATE 4 MG/ML IJ SOLN
INTRAMUSCULAR | Status: DC | PRN
  Administered 2013-10-13: 8 mg via INTRAVENOUS

## 2013-10-13 MED ORDER — PROPOFOL 10 MG/ML IV BOLUS
INTRAVENOUS | Status: AC
  Filled 2013-10-13: qty 20

## 2013-10-13 MED ORDER — SUFENTANIL CITRATE 50 MCG/ML IV SOLN
INTRAVENOUS | Status: AC
  Filled 2013-10-13: qty 1

## 2013-10-13 MED ORDER — PROMETHAZINE HCL 25 MG/ML IJ SOLN: 6.2500 mg | INTRAMUSCULAR | Status: DC | PRN

## 2013-10-13 MED ORDER — PROMETHAZINE HCL 25 MG/ML IJ SOLN
INTRAMUSCULAR | Status: AC
  Administered 2013-10-13: 6.25 mg via INTRAVENOUS
  Filled 2013-10-13: qty 1

## 2013-10-13 MED ORDER — ONDANSETRON HCL 4 MG/2ML IJ SOLN
INTRAMUSCULAR | Status: AC
  Filled 2013-10-13: qty 2

## 2013-10-13 MED ORDER — LORATADINE 10 MG PO TABS: 10.0000 mg | ORAL_TABLET | Freq: Every day | ORAL | Status: DC | PRN

## 2013-10-13 MED ORDER — CEFAZOLIN SODIUM-DEXTROSE 2-3 GM-% IV SOLR
INTRAVENOUS | Status: AC
  Filled 2013-10-13: qty 50

## 2013-10-13 MED ORDER — METHOCARBAMOL 750 MG PO TABS: 750.0000 mg | ORAL_TABLET | Freq: Four times a day (QID) | ORAL | Status: DC | PRN

## 2013-10-13 MED ORDER — VITAMIN D 1000 UNITS PO TABS
2000.0000 [IU] | ORAL_TABLET | Freq: Every day | ORAL | Status: DC
  Administered 2013-10-13 – 2013-10-17 (×5): 2000 [IU] via ORAL
  Filled 2013-10-13 (×6): qty 2

## 2013-10-13 MED ORDER — EPHEDRINE SULFATE 50 MG/ML IJ SOLN
INTRAMUSCULAR | Status: DC | PRN
  Administered 2013-10-13: 10 mg via INTRAVENOUS

## 2013-10-13 MED ORDER — GLYCOPYRROLATE 0.2 MG/ML IJ SOLN
INTRAMUSCULAR | Status: DC | PRN
  Administered 2013-10-13: 0.4 mg via INTRAVENOUS

## 2013-10-13 MED ORDER — METHYLCELLULOSE (LAXATIVE) 500 MG PO TABS: 2.0000 | ORAL_TABLET | Freq: Two times a day (BID) | ORAL | Status: DC

## 2013-10-13 MED ORDER — METHOCARBAMOL 1000 MG/10ML IJ SOLN
500.0000 mg | Freq: Four times a day (QID) | INTRAVENOUS | Status: DC | PRN
  Filled 2013-10-13: qty 10

## 2013-10-13 MED ORDER — CHLORHEXIDINE GLUCONATE 4 % EX LIQD: 1.0000 "application " | Freq: Once | CUTANEOUS | Status: DC

## 2013-10-13 MED ORDER — HYDROMORPHONE HCL 1 MG/ML IJ SOLN
INTRAMUSCULAR | Status: AC
  Administered 2013-10-13: 0.5 mg via INTRAVENOUS
  Filled 2013-10-13: qty 1

## 2013-10-13 MED ORDER — ROCURONIUM BROMIDE 100 MG/10ML IV SOLN
INTRAVENOUS | Status: DC | PRN
  Administered 2013-10-13 (×4): 10 mg via INTRAVENOUS
  Administered 2013-10-13: 40 mg via INTRAVENOUS

## 2013-10-13 MED ORDER — ONDANSETRON HCL 4 MG/2ML IJ SOLN
INTRAMUSCULAR | Status: DC | PRN
  Administered 2013-10-13: 4 mg via INTRAVENOUS

## 2013-10-13 MED ORDER — SODIUM CHLORIDE 0.9 % IJ SOLN: 3.0000 mL | INTRAMUSCULAR | Status: DC | PRN

## 2013-10-13 MED ORDER — OXYCODONE HCL 5 MG/5ML PO SOLN: 5.0000 mg | Freq: Once | ORAL | Status: DC | PRN

## 2013-10-13 MED ORDER — MIDAZOLAM HCL 5 MG/5ML IJ SOLN
INTRAMUSCULAR | Status: DC | PRN
  Administered 2013-10-13 (×2): 1 mg via INTRAVENOUS

## 2013-10-13 MED ORDER — EPHEDRINE SULFATE 50 MG/ML IJ SOLN
INTRAMUSCULAR | Status: AC
  Filled 2013-10-13: qty 1

## 2013-10-13 MED ORDER — BUPIVACAINE-EPINEPHRINE 0.25% -1:200000 IJ SOLN
INTRAMUSCULAR | Status: DC | PRN
  Administered 2013-10-13: 85 mL

## 2013-10-13 MED ORDER — LIP MEDEX EX OINT
1.0000 "application " | TOPICAL_OINTMENT | Freq: Two times a day (BID) | CUTANEOUS | Status: DC
  Filled 2013-10-13: qty 7

## 2013-10-13 MED ORDER — SACCHAROMYCES BOULARDII 250 MG PO CAPS
250.0000 mg | ORAL_CAPSULE | Freq: Two times a day (BID) | ORAL | Status: DC
  Administered 2013-10-13 – 2013-10-17 (×8): 250 mg via ORAL
  Filled 2013-10-13 (×9): qty 1

## 2013-10-13 MED ORDER — BLISTEX MEDICATED EX OINT
TOPICAL_OINTMENT | Freq: Two times a day (BID) | CUTANEOUS | Status: DC
  Administered 2013-10-13 – 2013-10-17 (×8): via TOPICAL
  Filled 2013-10-13: qty 10

## 2013-10-13 MED ORDER — METHOCARBAMOL 500 MG PO TABS
500.0000 mg | ORAL_TABLET | Freq: Four times a day (QID) | ORAL | Status: DC | PRN
  Administered 2013-10-13: 500 mg via ORAL

## 2013-10-13 MED ORDER — SODIUM CHLORIDE 0.9 % IJ SOLN
3.0000 mL | Freq: Two times a day (BID) | INTRAMUSCULAR | Status: DC
  Administered 2013-10-15 – 2013-10-17 (×5): 3 mL via INTRAVENOUS

## 2013-10-13 MED ORDER — LIDOCAINE HCL (CARDIAC) 20 MG/ML IV SOLN
INTRAVENOUS | Status: AC
  Filled 2013-10-13: qty 5

## 2013-10-13 MED ORDER — BUPIVACAINE-EPINEPHRINE (PF) 0.25% -1:200000 IJ SOLN
INTRAMUSCULAR | Status: AC
  Filled 2013-10-13: qty 60

## 2013-10-13 MED ORDER — LACTATED RINGERS IV SOLN
INTRAVENOUS | Status: DC
  Administered 2013-10-13 – 2013-10-15 (×3): via INTRAVENOUS

## 2013-10-13 MED ORDER — FENTANYL CITRATE 0.05 MG/ML IJ SOLN: 25.0000 ug | INTRAMUSCULAR | Status: DC | PRN

## 2013-10-13 MED ORDER — ONDANSETRON HCL 4 MG PO TABS
4.0000 mg | ORAL_TABLET | Freq: Four times a day (QID) | ORAL | Status: DC | PRN
  Filled 2013-10-13: qty 1

## 2013-10-13 MED ORDER — ONDANSETRON HCL 4 MG/2ML IJ SOLN: 4.0000 mg | Freq: Four times a day (QID) | INTRAMUSCULAR | Status: DC | PRN

## 2013-10-13 MED ORDER — MENTHOL 3 MG MT LOZG: 1.0000 | LOZENGE | OROMUCOSAL | Status: DC | PRN

## 2013-10-13 MED ORDER — CEFAZOLIN SODIUM 1-5 GM-% IV SOLN
1.0000 g | Freq: Four times a day (QID) | INTRAVENOUS | Status: AC
  Administered 2013-10-13 – 2013-10-14 (×3): 1 g via INTRAVENOUS
  Filled 2013-10-13 (×3): qty 50

## 2013-10-13 MED ORDER — SALINE SPRAY 0.65 % NA SOLN
2.0000 | NASAL | Status: DC | PRN
  Filled 2013-10-13: qty 44

## 2013-10-13 MED ORDER — SODIUM CHLORIDE 0.9 % IJ SOLN
INTRAMUSCULAR | Status: AC
  Filled 2013-10-13: qty 10

## 2013-10-13 MED ORDER — NEOSTIGMINE METHYLSULFATE 10 MG/10ML IV SOLN
INTRAVENOUS | Status: DC | PRN
  Administered 2013-10-13: 3 mg via INTRAVENOUS

## 2013-10-13 MED ORDER — ROCURONIUM BROMIDE 50 MG/5ML IV SOLN
INTRAVENOUS | Status: AC
  Filled 2013-10-13: qty 1

## 2013-10-13 MED ORDER — MEPERIDINE HCL 25 MG/ML IJ SOLN: 6.2500 mg | INTRAMUSCULAR | Status: DC | PRN

## 2013-10-13 MED ORDER — CEFAZOLIN SODIUM-DEXTROSE 2-3 GM-% IV SOLR: 2.0000 g | Freq: Three times a day (TID) | INTRAVENOUS | Status: DC

## 2013-10-13 MED ORDER — HYDROMORPHONE HCL 1 MG/ML IJ SOLN
0.2500 mg | INTRAMUSCULAR | Status: DC | PRN
  Administered 2013-10-13 (×2): 0.5 mg via INTRAVENOUS

## 2013-10-13 MED ORDER — DIPHENHYDRAMINE HCL 50 MG/ML IJ SOLN
INTRAMUSCULAR | Status: AC
  Filled 2013-10-13: qty 1

## 2013-10-13 MED ORDER — GLYCOPYRROLATE 0.2 MG/ML IJ SOLN
INTRAMUSCULAR | Status: AC
  Filled 2013-10-13: qty 2

## 2013-10-13 MED ORDER — ASPIRIN EC 81 MG PO TBEC
81.0000 mg | DELAYED_RELEASE_TABLET | Freq: Two times a day (BID) | ORAL | Status: DC
  Administered 2013-10-13 – 2013-10-17 (×8): 81 mg via ORAL
  Filled 2013-10-13 (×10): qty 1

## 2013-10-13 MED ORDER — DEXAMETHASONE SODIUM PHOSPHATE 4 MG/ML IJ SOLN
INTRAMUSCULAR | Status: AC
  Filled 2013-10-13: qty 2

## 2013-10-13 MED ORDER — NEOSTIGMINE METHYLSULFATE 10 MG/10ML IV SOLN
INTRAVENOUS | Status: AC
  Filled 2013-10-13: qty 1

## 2013-10-13 MED ORDER — OXYCODONE HCL 5 MG PO TABS
ORAL_TABLET | ORAL | Status: AC
  Administered 2013-10-13: 10 mg via ORAL
  Filled 2013-10-13: qty 2

## 2013-10-13 MED ORDER — HYDROMORPHONE HCL 1 MG/ML IJ SOLN
INTRAMUSCULAR | Status: AC
  Filled 2013-10-13: qty 1

## 2013-10-13 MED ORDER — HEPARIN SODIUM (PORCINE) 5000 UNIT/ML IJ SOLN
5000.0000 [IU] | Freq: Three times a day (TID) | INTRAMUSCULAR | Status: DC
  Administered 2013-10-13 – 2013-10-17 (×12): 5000 [IU] via SUBCUTANEOUS
  Filled 2013-10-13 (×16): qty 1

## 2013-10-13 MED ORDER — BISACODYL 10 MG RE SUPP: 10.0000 mg | Freq: Two times a day (BID) | RECTAL | Status: DC | PRN

## 2013-10-13 MED ORDER — OXYCODONE HCL 5 MG PO TABS: 5.0000 mg | ORAL_TABLET | ORAL | Status: DC | PRN

## 2013-10-13 MED ORDER — SUCCINYLCHOLINE CHLORIDE 20 MG/ML IJ SOLN
INTRAMUSCULAR | Status: AC
  Filled 2013-10-13: qty 1

## 2013-10-13 MED ORDER — LACTATED RINGERS IV BOLUS (SEPSIS): 1000.0000 mL | Freq: Three times a day (TID) | INTRAVENOUS | Status: AC | PRN

## 2013-10-13 MED ORDER — MAGIC MOUTHWASH: 15.0000 mL | Freq: Four times a day (QID) | ORAL | Status: DC | PRN

## 2013-10-13 MED ORDER — BUPIVACAINE 0.25 % ON-Q PUMP DUAL CATH 300 ML
300.0000 mL | INJECTION | Status: DC
  Filled 2013-10-13 (×2): qty 300

## 2013-10-13 MED ORDER — 0.9 % SODIUM CHLORIDE (POUR BTL) OPTIME
TOPICAL | Status: DC | PRN
Start: 2013-10-13 — End: 2013-10-13
  Administered 2013-10-13: 1000 mL

## 2013-10-13 MED ORDER — SODIUM CHLORIDE 0.9 % IV SOLN: 250.0000 mL | INTRAVENOUS | Status: DC | PRN

## 2013-10-13 MED ORDER — DIPHENHYDRAMINE HCL 50 MG/ML IJ SOLN
10.0000 mg | Freq: Once | INTRAMUSCULAR | Status: AC
  Administered 2013-10-13: 10 mg via INTRAVENOUS

## 2013-10-13 MED ORDER — CALCIUM POLYCARBOPHIL 625 MG PO TABS
625.0000 mg | ORAL_TABLET | Freq: Every day | ORAL | Status: DC
  Administered 2013-10-13 – 2013-10-14 (×2): 625 mg via ORAL
  Administered 2013-10-15: 11:00:00 via ORAL
  Filled 2013-10-13 (×4): qty 1

## 2013-10-13 MED ORDER — PROMETHAZINE HCL 25 MG/ML IJ SOLN
6.2500 mg | INTRAMUSCULAR | Status: DC | PRN
  Administered 2013-10-13: 6.25 mg via INTRAVENOUS

## 2013-10-13 MED ORDER — POLYETHYLENE GLYCOL 3350 17 G PO PACK: 17.0000 g | PACK | Freq: Two times a day (BID) | ORAL | Status: DC | PRN

## 2013-10-13 MED ORDER — LORATADINE 10 MG PO TABS
10.0000 mg | ORAL_TABLET | Freq: Every day | ORAL | Status: DC
  Administered 2013-10-13 – 2013-10-17 (×5): 10 mg via ORAL
  Filled 2013-10-13 (×5): qty 1

## 2013-10-13 MED ORDER — PROPOFOL 10 MG/ML IV BOLUS
INTRAVENOUS | Status: DC | PRN
  Administered 2013-10-13: 120 mg via INTRAVENOUS

## 2013-10-13 MED ORDER — KETOROLAC TROMETHAMINE 30 MG/ML IJ SOLN
INTRAMUSCULAR | Status: AC
  Filled 2013-10-13: qty 1

## 2013-10-13 MED ORDER — METOPROLOL TARTRATE 1 MG/ML IV SOLN
5.0000 mg | Freq: Four times a day (QID) | INTRAVENOUS | Status: DC | PRN
Start: 2013-10-13 — End: 2013-10-17

## 2013-10-13 MED ORDER — LACTATED RINGERS IV SOLN
INTRAVENOUS | Status: DC | PRN
  Administered 2013-10-13 (×3): via INTRAVENOUS

## 2013-10-13 MED ORDER — SCOPOLAMINE 1 MG/3DAYS TD PT72
MEDICATED_PATCH | TRANSDERMAL | Status: AC
  Filled 2013-10-13: qty 1

## 2013-10-13 MED ORDER — DIPHENHYDRAMINE HCL 50 MG/ML IJ SOLN: 12.5000 mg | Freq: Four times a day (QID) | INTRAMUSCULAR | Status: DC | PRN

## 2013-10-13 MED ORDER — VITAMIN C 500 MG PO TABS
1000.0000 mg | ORAL_TABLET | Freq: Every day | ORAL | Status: DC
  Administered 2013-10-13 – 2013-10-17 (×5): 1000 mg via ORAL
  Filled 2013-10-13 (×5): qty 2

## 2013-10-13 MED ORDER — ALUM & MAG HYDROXIDE-SIMETH 200-200-20 MG/5ML PO SUSP: 30.0000 mL | Freq: Four times a day (QID) | ORAL | Status: DC | PRN

## 2013-10-13 MED ORDER — ZOLPIDEM TARTRATE 5 MG PO TABS: 5.0000 mg | ORAL_TABLET | Freq: Every evening | ORAL | Status: DC | PRN

## 2013-10-13 MED ORDER — GUAIFENESIN-DM 100-10 MG/5ML PO SYRP: 15.0000 mL | ORAL_SOLUTION | ORAL | Status: DC | PRN

## 2013-10-13 MED ORDER — LIDOCAINE HCL (CARDIAC) 20 MG/ML IV SOLN
INTRAVENOUS | Status: DC | PRN
  Administered 2013-10-13: 10 mg via INTRAVENOUS

## 2013-10-13 MED ORDER — MIDAZOLAM HCL 2 MG/2ML IJ SOLN
INTRAMUSCULAR | Status: AC
  Filled 2013-10-13: qty 2

## 2013-10-13 SURGICAL SUPPLY — 74 items
APPLICATOR COTTON TIP 6IN STRL (MISCELLANEOUS) IMPLANT
APPLIER CLIP LOGIC TI 5 (MISCELLANEOUS) IMPLANT
APR CLP MED LRG 33X5 (MISCELLANEOUS)
BINDER ABD UNIV 10 28-50 (GAUZE/BANDAGES/DRESSINGS) IMPLANT
BINDER ABDOM UNIV 10 (GAUZE/BANDAGES/DRESSINGS) ×3
BLADE SURG ROTATE 9660 (MISCELLANEOUS) IMPLANT
CANISTER SUCTION 2500CC (MISCELLANEOUS) IMPLANT
CATH KIT ON-Q SILVERSOAK 7.5 (CATHETERS) IMPLANT
CATH KIT ON-Q SILVERSOAK 7.5IN (CATHETERS) ×6 IMPLANT
CHLORAPREP W/TINT 26ML (MISCELLANEOUS) ×3 IMPLANT
COVER SURGICAL LIGHT HANDLE (MISCELLANEOUS) ×3 IMPLANT
DEVICE SECURE STRAP 25 ABSORB (INSTRUMENTS) IMPLANT
DEVICE TROCAR PUNCTURE CLOSURE (ENDOMECHANICALS) ×3 IMPLANT
DRAIN CHANNEL 19F RND (DRAIN) ×4 IMPLANT
DRAPE WARM FLUID 44X44 (DRAPE) ×3 IMPLANT
DRSG OPSITE POSTOP 4X10 (GAUZE/BANDAGES/DRESSINGS) ×2 IMPLANT
DRSG TEGADERM 2-3/8X2-3/4 SM (GAUZE/BANDAGES/DRESSINGS) ×6 IMPLANT
DRSG TEGADERM 4X4.75 (GAUZE/BANDAGES/DRESSINGS) ×4 IMPLANT
ELECT CAUTERY BLADE 6.4 (BLADE) ×2 IMPLANT
ELECT REM PT RETURN 9FT ADLT (ELECTROSURGICAL) ×3
ELECTRODE REM PT RTRN 9FT ADLT (ELECTROSURGICAL) ×1 IMPLANT
EVACUATOR SILICONE 100CC (DRAIN) ×4 IMPLANT
GAUZE SPONGE 2X2 8PLY NS (GAUZE/BANDAGES/DRESSINGS) ×2 IMPLANT
GLOVE BIO SURGEON STRL SZ 6.5 (GLOVE) ×1 IMPLANT
GLOVE BIO SURGEONS STRL SZ 6.5 (GLOVE) ×1
GLOVE BIOGEL PI IND STRL 6.5 (GLOVE) IMPLANT
GLOVE BIOGEL PI IND STRL 7.0 (GLOVE) IMPLANT
GLOVE BIOGEL PI IND STRL 8 (GLOVE) ×1 IMPLANT
GLOVE BIOGEL PI INDICATOR 6.5 (GLOVE) ×4
GLOVE BIOGEL PI INDICATOR 7.0 (GLOVE) ×14
GLOVE BIOGEL PI INDICATOR 8 (GLOVE) ×2
GLOVE ECLIPSE 8.0 STRL XLNG CF (GLOVE) ×5 IMPLANT
GLOVE SS BIOGEL STRL SZ 6.5 (GLOVE) IMPLANT
GLOVE SUPERSENSE BIOGEL SZ 6.5 (GLOVE) ×2
GLOVE SURG SS PI 6.5 STRL IVOR (GLOVE) ×2 IMPLANT
GLOVE SURG SS PI 7.0 STRL IVOR (GLOVE) ×8 IMPLANT
GOWN STRL REUS W/ TWL LRG LVL3 (GOWN DISPOSABLE) ×2 IMPLANT
GOWN STRL REUS W/ TWL XL LVL3 (GOWN DISPOSABLE) ×1 IMPLANT
GOWN STRL REUS W/TWL LRG LVL3 (GOWN DISPOSABLE) ×21
GOWN STRL REUS W/TWL XL LVL3 (GOWN DISPOSABLE) ×3
KIT BASIN OR (CUSTOM PROCEDURE TRAY) ×3 IMPLANT
KIT ROOM TURNOVER OR (KITS) ×3 IMPLANT
MARKER SKIN DUAL TIP RULER LAB (MISCELLANEOUS) ×2 IMPLANT
MESH VENTRALIGHT ST 12X14IN (Mesh General) ×2 IMPLANT
NDL SPNL 22GX3.5 QUINCKE BK (NEEDLE) ×1 IMPLANT
NEEDLE 22X1 1/2 (OR ONLY) (NEEDLE) ×3 IMPLANT
NEEDLE SPNL 22GX3.5 QUINCKE BK (NEEDLE) ×3 IMPLANT
NS IRRIG 1000ML POUR BTL (IV SOLUTION) ×6 IMPLANT
PAD ARMBOARD 7.5X6 YLW CONV (MISCELLANEOUS) ×6 IMPLANT
PENCIL BUTTON HOLSTER BLD 10FT (ELECTRODE) ×2 IMPLANT
SCALPEL HARMONIC ACE (MISCELLANEOUS) IMPLANT
SCISSORS LAP 5X35 DISP (ENDOMECHANICALS) ×2 IMPLANT
SET IRRIG TUBING LAPAROSCOPIC (IRRIGATION / IRRIGATOR) IMPLANT
SLEEVE ENDOPATH XCEL 5M (ENDOMECHANICALS) ×6 IMPLANT
SPONGE LAP 18X18 X RAY DECT (DISPOSABLE) ×8 IMPLANT
SUT ETHILON 2 0 FS 18 (SUTURE) ×4 IMPLANT
SUT MNCRL AB 4-0 PS2 18 (SUTURE) ×3 IMPLANT
SUT NOVA 1 T20/GS 25DT (SUTURE) ×4 IMPLANT
SUT PDS AB 1 TP1 96 (SUTURE) ×4 IMPLANT
SUT PROLENE 1 CT (SUTURE) ×30 IMPLANT
SUT VIC AB 2-0 SH 18 (SUTURE) ×6 IMPLANT
SUT VICRYL 0 UR6 27IN ABS (SUTURE) IMPLANT
SYR 50ML LL SCALE MARK (SYRINGE) ×2 IMPLANT
TACKER 5MM HERNIA 3.5CML NAB (ENDOMECHANICALS) IMPLANT
TOWEL OR 17X24 6PK STRL BLUE (TOWEL DISPOSABLE) ×3 IMPLANT
TOWEL OR 17X26 10 PK STRL BLUE (TOWEL DISPOSABLE) ×3 IMPLANT
TRAY FOLEY CATH 16FR SILVER (SET/KITS/TRAYS/PACK) ×2 IMPLANT
TRAY LAPAROSCOPIC (CUSTOM PROCEDURE TRAY) ×3 IMPLANT
TROCAR XCEL NON-BLD 11X100MML (ENDOMECHANICALS) ×3 IMPLANT
TROCAR XCEL NON-BLD 5MMX100MML (ENDOMECHANICALS) ×3 IMPLANT
TUBE CONNECTING 12'X1/4 (SUCTIONS) ×1
TUBE CONNECTING 12X1/4 (SUCTIONS) ×1 IMPLANT
TUNNELER SHEATH ON-Q 16GX12 DP (PAIN MANAGEMENT) ×2 IMPLANT
YANKAUER SUCT BULB TIP NO VENT (SUCTIONS) ×2 IMPLANT

## 2013-10-13 NOTE — Anesthesia Procedure Notes (Signed)
Procedure Name: Intubation Date/Time: 10/13/2013 7:45 AM Performed by: Melina Copa, Railynn Ballo R Pre-anesthesia Checklist: Patient identified, Emergency Drugs available, Suction available, Patient being monitored and Timeout performed Patient Re-evaluated:Patient Re-evaluated prior to inductionOxygen Delivery Method: Circle system utilized Preoxygenation: Pre-oxygenation with 100% oxygen Intubation Type: IV induction Ventilation: Mask ventilation without difficulty Laryngoscope Size: Mac and 3 Grade View: Grade I Tube type: Oral Tube size: 8.5 mm Number of attempts: 1 Airway Equipment and Method: Stylet Placement Confirmation: ETT inserted through vocal cords under direct vision,  positive ETCO2 and breath sounds checked- equal and bilateral Secured at: 23 cm Tube secured with: Tape Dental Injury: Teeth and Oropharynx as per pre-operative assessment

## 2013-10-13 NOTE — Transfer of Care (Signed)
Immediate Anesthesia Transfer of Care Note  Patient: Preston Reeves  Procedure(s) Performed: Procedure(s): REPAIR OF VENTRAL WALL HERNIA WITH UNILATERAL COMPARTMENT SEPARATION (N/A) INSERTION OF MESH (N/A) LAPAROSCOPIC LYSIS OF ADHESIONS (N/A)  Patient Location: PACU  Anesthesia Type:General  Level of Consciousness: sedated  Airway & Oxygen Therapy: Patient Spontanous Breathing and Patient connected to nasal cannula oxygen  Post-op Assessment: Report given to PACU RN, Post -op Vital signs reviewed and stable and Patient moving all extremities  Post vital signs: Reviewed and stable  Complications: No apparent anesthesia complications

## 2013-10-13 NOTE — Op Note (Signed)
10/13/2013  12:59 PM  PATIENT:  Preston Reeves  73 y.o. male  Patient Care Team: Darlyne Russian, MD as PCP - General (Family Medicine) Raynelle Bring, MD as Consulting Physician (Urology) Irene Shipper, MD as Consulting Physician (Gastroenterology) Darlin Coco, MD as Consulting Physician (Cardiology)  PRE-OPERATIVE DIAGNOSIS:  Incisional Abdominal Wall Hernias  POST-OPERATIVE DIAGNOSIS:  Incisional Abdominal Wall Hernias  PROCEDURE:  Procedure(s):  LAPAROSCOPIC LYSIS OF ADHESIONS x 68min UNILATERAL COMPONENT SEPARATION (anterior right anterior external oblique fascia release) REPAIR OF VENTRAL WALL HERNIA WITH  INSERTION OF MESH  SURGEON:  Surgeon(s): Michael Boston, MD  ASSISTANT: RNFA   ANESTHESIA:   local and general  EBL:  Total I/O In: 2100 [I.V.:2100] Out: 350 [Urine:250; Blood:100]  Delay start of Pharmacological VTE agent (>24hrs) due to surgical blood loss or risk of bleeding:  no  DRAINS: (19Fr) Blake drain(s) in the abdomional wall.  LUQ = SQ, LLQ = preperitoneal)   SPECIMEN:  Source of Specimen:  Hernia sac  DISPOSITION OF SPECIMEN:  PATHOLOGY  COUNTS:  YES  PLAN OF CARE: Admit to inpatient   PATIENT DISPOSITION:  PACU - hemodynamically stable.  INDICATION: Pleasant patient has developed a ventral wall abdominal hernias.   Recommendation was made for surgical repair:  The anatomy & physiology of the abdominal wall was discussed. The pathophysiology of hernias was discussed. Natural history risks without surgery including progeressive enlargement, pain, incarceration & strangulation was discussed. Contributors to complications such as smoking, obesity, diabetes, prior surgery, etc were discussed.  I feel the risks of no intervention will lead to serious problems that outweigh the operative risks; therefore, I recommended surgery to reduce and repair the hernia. I explained laparoscopic techniques with possible need for an open approach. I noted the  probable use of mesh to patch and/or buttress the hernia repair  Risks such as bleeding, infection, abscess, need for further treatment, heart attack, death, and other risks were discussed. I noted a good likelihood this will help address the problem. Goals of post-operative recovery were discussed as well. Possibility that this will not correct all symptoms was explained. I stressed the importance of low-impact activity, aggressive pain control, avoiding constipation, & not pushing through pain to minimize risk of post-operative chronic pain or injury. Possibility of reherniation especially with smoking, obesity, diabetes, immunosuppression, and other health conditions was discussed. We will work to minimize complications.  An educational handout further explaining the pathology & treatment options was given as well. Questions were answered. The patient expresses understanding & wishes to proceed with surgery.   OR FINDINGS: Numerous large ventral hernias.  Large his suprapubic/infraumbilical.  42H06 cm.  Thin bridge and umbilical level.  Supraumbilical ventral incisional hernia 10 x 8 cm.  22 cm long region total.   Type of repair - Laparoscopic underlay repair   Name of mesh -  Bard Ventralight dual sided (polypropylene / Seprafilm)  Size of mesh - Length 35 cm, Width 30 cm  Mesh overlap - 5-7 cm  Placement of mesh - Intraperitoneal underlay repair   DESCRIPTION:   Informed consent was confirmed. The patient underwent general anaesthesia without difficulty. The patient was positioned appropriately. VTE prevention in place. The patient's abdomen was clipped, prepped, & draped in a sterile fashion. Surgical timeout confirmed our plan.  The patient was positioned in reverse Trendelenburg. Abdominal entry was gained using optical entry technique in the left upper abdomen. Entry was clean. I induced carbon dioxide insufflation. Camera inspection revealed no injury. Extra ports  were carefully  placed under direct laparoscopic visualization.   I could see the hernias above in the central abdomen.  I did laparoscopic lysis of adhesions to expose the entire anterior abdominal wall.  I primarily used and focused cold scissors.  Moderate adhesions of greater omentum.    I made sure hemostasis was good.  An incentive a transabdominal preperitoneal release.  Scored in the right anterior flank.  Came across the suprapubic region.  Could easily get into the preperitoneal plane.  I freed the bladder off the pubic rim as well as the dome which was going up into the suprapubic hernia.  Release takedown.  Preperitoneum off bilateral spermatic vessels.  Mild laxity and right side suspicious for indirect inguinal hernia.  Hold high ligation of left inguinal hernia intact.  I opened up in the midline and given the large redundant skin and large giant hernia sacs.  I freed the hernia sacs off the skin and subcutaneous tissue.  Down to the rectus fascia.  Carefully freed the hernia sac.  Hernia sac ischemic continue dissection cephalad to the most apex.  The slight pain at the umbilicus was actually quite thinned out.  I freed the skin and subcutaneous tissues and abdominal wall all the way over to the anterior superior iliac spines/anterior axillary line bilaterally.  A TurboVac hernia sac to any once the Neurontin Remeron.  With that the right side could come over quite well.  She had 2 x 2 cm hernia at his left flank former colostomy site.  I removed all the Novafil stitches.  I did a release on the right side since the left side had had an ostomy.  Chose an area lateral to the lateral rectus edge.  Stayed lateral to the superficial epigastric vessels.  I scored the fascia to the external oblique vertically near the lateral clavicular line and carried that from the right subcostal ridge to the pubic rim.  That gave me 3-4 cm of laxity.  That help bring the fascia much more close together.  Much less tension.  I  [placed On-Q tunneling catheters in the preperitoneal plane on both subcostal ridge and in towards the posterior superior iliac spines to stay way lateral to the anticipated giant mesh.  Because of the giant hernia sac, I decided to use mesh.  Because peritoneum was of poor quality and very thinned out to dermis /  Skin, I chose a dual sided mesh.  35 x 30 cm.  Laid it as a diamond.  Inferior corner went all the way down to the level of prostate in the preperitoneal plane.  I placed #1 Prolene stitches through the mesh, then fascia along the pubic rim and some through the periosteum of the anterior pubic rim interrupted stitches x4.  Help tack the mesh well.  I tacked the peritoneum & dome of bladder to the posterior side of the mesh to allow the inferior third of the mesh to be in the preperitoneal plane.  Did that with 2 Vicryl stitches.  I then proceeded to place #1 Prolene stitches around the edges of the mesh.  Brought it out through the anterior abdominal wall fascia using the needle as well as an EndoClose suture passer.  Carefully placed in or around the periphery.  The upper corner went above the epigastric region.  Essentially this covered the entire and anterior abdominal wall with mesh.  I repositioned the ports to be very laterally in the mid and posterior axillary lines under direct  visualization.  A 10 stitches down.  Used 26 #1 Prolene stitches total.  I made sure that mesh was tacked lateral to the component separation release on the right side but not lateral to the OnQ catheters  I placed drains as above.  I closed the midline fascia using #1 running looped PDS, taking a few bites of the mesh as well to centrally tack the mesh.  Came together well with minimal tension.  I reinsufflated the abdomen.  The mesh provided at least 5-10 cm circumferential coverage around the entire region of hernia defects.   The edges & central part of the mesh We will sac.  There were no defects.  Because the  mesh edges were well tacked & so lateral, I did feel any tacks were needed around the periphery.  Mesh laid well. Capnoperitoneum was evacuated. Ports were removed. The skin was closed with Monocryl at the port sites and Steri-Strips on the fascial stitch puncture sites. I trimmed out excess thinned out skin to healthy kin edges.  Closed the midline wound using interrupted 2-0 Vicryl deep dermal stitches and running 4-0 Monocryl suture. OnQ catheters were placed and the sheathes peeled away. On-Q pump was secured. Patient is being extubated to go to the recovery room. I'm about discussed operative findings with the patient's family.   Adin Hector, M.D., F.A.C.S. Gastrointestinal and Minimally Invasive Surgery [facility] [address] [place], [place] [address] [phone] Main / Paging

## 2013-10-13 NOTE — H&P (View-Only) (Signed)
CXR with neck mass - Neck Xray series recommended by radiology  Adin Hector, M.D., F.A.C.S. Gastrointestinal and Minimally Invasive Surgery [facility] [address] [place], [place] [address] [phone] Main / Paging

## 2013-10-13 NOTE — Anesthesia Postprocedure Evaluation (Signed)
  Anesthesia Post-op Note  Patient: Preston Reeves  Procedure(s) Performed: Procedure(s): REPAIR OF VENTRAL WALL HERNIA WITH UNILATERAL COMPARTMENT SEPARATION (N/A) INSERTION OF MESH (N/A) LAPAROSCOPIC LYSIS OF ADHESIONS (N/A)  Patient Location: PACU  Anesthesia Type:General  Level of Consciousness: sedated, patient cooperative and responds to stimulation  Airway and Oxygen Therapy: Patient Spontanous Breathing and Patient connected to nasal cannula oxygen  Post-op Pain: none  Post-op Assessment: Post-op Vital signs reviewed, Patient's Cardiovascular Status Stable, Respiratory Function Stable, Patent Airway, No signs of Nausea or vomiting and Pain level controlled  Post-op Vital Signs: Reviewed and stable  Last Vitals:  Filed Vitals:   10/13/13 1315  BP: 129/74  Pulse: 89  Temp:   Resp: 16    Complications: No apparent anesthesia complications

## 2013-10-13 NOTE — Interval H&P Note (Signed)
History and Physical Interval Note:  10/13/2013 7:22 AM  Preston Reeves  has presented today for surgery, with the diagnosis of Incisional Abdominal Wall Hernias  The various methods of treatment have been discussed with the patient and family. After consideration of risks, benefits and other options for treatment, the patient has consented to  Procedure(s): LAPAROSCOPIC OPEN EXPLORATION AND REPAIR OF HERNIAS IN ABDOMINAL (N/A) INSERTION OF MESH (N/A) as a surgical intervention .  The patient's history has been reviewed, patient examined, no change in status, stable for surgery.  I have reviewed the patient's chart and labs.  Questions were answered to the patient's satisfaction.     [name] C.

## 2013-10-14 ENCOUNTER — Observation Stay (HOSPITAL_COMMUNITY): Payer: Medicare PPO

## 2013-10-14 DIAGNOSIS — J449 Chronic obstructive pulmonary disease, unspecified: Secondary | ICD-10-CM | POA: Diagnosis present

## 2013-10-14 DIAGNOSIS — Z932 Ileostomy status: Secondary | ICD-10-CM | POA: Diagnosis not present

## 2013-10-14 DIAGNOSIS — K660 Peritoneal adhesions (postprocedural) (postinfection): Secondary | ICD-10-CM | POA: Diagnosis present

## 2013-10-14 DIAGNOSIS — K560 Paralytic ileus: Secondary | ICD-10-CM | POA: Diagnosis not present

## 2013-10-14 DIAGNOSIS — K439 Ventral hernia without obstruction or gangrene: Secondary | ICD-10-CM | POA: Diagnosis not present

## 2013-10-14 MED ORDER — INFLUENZA VAC SPLIT QUAD 0.5 ML IM SUSY: 0.5000 mL | PREFILLED_SYRINGE | INTRAMUSCULAR | Status: DC

## 2013-10-14 MED ORDER — IOHEXOL 300 MG/ML  SOLN
75.0000 mL | Freq: Once | INTRAMUSCULAR | Status: AC | PRN
  Administered 2013-10-14: 75 mL via INTRAVENOUS

## 2013-10-14 MED ORDER — INFLUENZA VAC SPLIT QUAD 0.5 ML IM SUSY
0.5000 mL | PREFILLED_SYRINGE | Freq: Once | INTRAMUSCULAR | Status: AC
  Administered 2013-10-14: 0.5 mL via INTRAMUSCULAR
  Filled 2013-10-14: qty 0.5

## 2013-10-14 NOTE — Progress Notes (Signed)
Pt ambulated once around unit today and tolerated well. Will continue to monitor. Syliva Overman

## 2013-10-14 NOTE — Progress Notes (Signed)
UR completed 

## 2013-10-14 NOTE — Progress Notes (Signed)
1 Day Post-Op  Subjective: Stable and alert. Sitting in chair. Tolerating liquid diet. No nausea. A Foley catheter in place.Good urine output. No stool or flatus  X-rays of the neck showed a soft tissue mass. Radiologist suggests CT scan. Wonder if this is artifactual.  Objective: Vital signs in last 24 hours: Temp:  [97.5 F (36.4 C)-98.5 F (36.9 C)] 98.1 F (36.7 C) (09/19 0947) Pulse Rate:  [71-94] 77 (09/19 0947) Resp:  [12-23] 16 (09/19 0947) BP: (121-164)/(54-89) 145/67 mmHg (09/19 0947) SpO2:  [94 %-100 %] 94 % (09/19 0947) Weight:  [182 lb 11.2 oz (82.872 kg)] 182 lb 11.2 oz (82.872 kg) (09/18 1616) Last BM Date: 10/13/13  Intake/Output from previous day: 09/18 0701 - 09/19 0700 In: 3928.5 [P.O.:1266; I.V.:2662.5] Out: 1515 [Urine:1125; Drains:290; Blood:100] Intake/Output this shift: Total I/O In: 243 [P.O.:240; I.V.:3] Out: 60 [Drains:60]  General appearance: looks good, considering magnitude of surgery appropriate mental status. No distress cooperative Resp: clear to auscultation bilaterally Neck: I do not palpate a soft tissue mass. GI: soft.  Active bowel sounds. Borderline distended. Wounds looked good. On-Q in place.  Ecchymoses of the penis and scrotum but no hematoma.  Lab Results:  No results found for this or any previous visit (from the past 24 hour(s)).   Studies/Results: Dg Neck Soft Tissue  10/13/2013   CLINICAL DATA:  Neck mass  EXAM: NECK SOFT TISSUES - 1+ VIEW  COMPARISON:  No similar prior exam is available at this institution for comparison or on [facility].  FINDINGS: There is no evidence of retropharyngeal soft tissue swelling or epiglottic enlargement. The cervical airway is unremarkable and no radio-opaque foreign body identified. Mid cervical spine degenerative change partly visualized.  IMPRESSION: No plain radiographic evidence for neck mass. Further targeted evaluation with ultrasound or CT with contrast could be considered based on the  patient's clinical presentation.   Electronically Signed   By: Conchita Paris M.D.   On: 10/13/2013 14:59    . aspirin EC  81 mg Oral BID  . cholecalciferol  2,000 Units Oral Daily  . heparin subcutaneous  5,000 Units Subcutaneous 3 times per day  . levothyroxine  137 mcg Oral QAC breakfast  . lip balm   Topical BID  . loratadine  10 mg Oral Daily  . polycarbophil  625 mg Oral Daily  . saccharomyces boulardii  250 mg Oral BID  . scopolamine  1 patch Transdermal Once  . sodium chloride  3 mL Intravenous Q12H  . vitamin C  1,000 mg Oral Daily     Assessment/Plan: s/p Procedure(s): REPAIR OF VENTRAL WALL HERNIA WITH UNILATERAL COMPARTMENT SEPARATION INSERTION OF MESH LAPAROSCOPIC LYSIS OF ADHESIONS  POD#1 - Ventral hernia repair with mesh, unilateral component separation, extensive lysis of adhesions Stable Considering magnitude of surgery we'll keep on full liquids and leave Foley one more day See if he can ambulate in the hall.  Question of soft tissue density of neck, tracheal area, not seen on the neck x-ray. Possible artifactual. We'll go ahead with a CT neck to settle the issue.   VTE proph-heparin   @PROBHOSP @  LOS: 1 day    Janda Cargo M 10/14/2013  . .prob

## 2013-10-15 LAB — BASIC METABOLIC PANEL
ANION GAP: 10 (ref 5–15)
BUN: 11 mg/dL (ref 6–23)
CHLORIDE: 97 meq/L (ref 96–112)
CO2: 29 meq/L (ref 19–32)
Calcium: 9.3 mg/dL (ref 8.4–10.5)
Creatinine, Ser: 0.79 mg/dL (ref 0.50–1.35)
GFR calc Af Amer: >90 mL/min (ref >90)
GFR calc non Af Amer: 87 mL/min — ABNORMAL LOW (ref >90)
Glucose, Bld: 89 mg/dL (ref 70–99)
POTASSIUM: 4.2 meq/L (ref 3.7–5.3)
Sodium: 136 mEq/L — ABNORMAL LOW (ref 137–147)

## 2013-10-15 LAB — CBC
HCT: 36.3 % — ABNORMAL LOW (ref 39.0–52.0)
Hemoglobin: 12.3 g/dL — ABNORMAL LOW (ref 13.0–17.0)
MCH: 30.4 pg (ref 26.0–34.0)
MCHC: 33.9 g/dL (ref 30.0–36.0)
MCV: 89.6 fL (ref 78.0–100.0)
PLATELETS: 238 10*3/uL (ref 150–400)
RBC: 4.05 MIL/uL — AB (ref 4.22–5.81)
RDW: 14.5 % (ref 11.5–15.5)
WBC: 8 10*3/uL (ref 4.0–10.5)

## 2013-10-15 NOTE — Progress Notes (Signed)
2 Days Post-Op  Subjective: Stable. No nausea but anorexic. No stool or flatus. Urine output high. IVF 50 cc per hour Ambulating in halls.  CT scan of neck is negative. As suspected, chest x-ray findings were artifactual.  Objective: Vital signs in last 24 hours: Temp:  [97.6 F (36.4 C)-98.6 F (37 C)] 97.6 F (36.4 C) (09/20 0618) Pulse Rate:  [70-88] 86 (09/20 0618) Resp:  [16-18] 18 (09/20 0618) BP: (152-168)/(73-89) 168/89 mmHg (09/20 0618) SpO2:  [89 %-95 %] 92 % (09/20 0618) Last BM Date: 10/13/13  Intake/Output from previous day: 09/19 0701 - 09/20 0700 In: 2167.2 [P.O.:1260; I.V.:907.2] Out: 3440 [Urine:3000; Drains:440] Intake/Output this shift:    General appearance: alert. Sitting in chair. Pleasant. No distress. Resp: clear to auscultation bilaterally GI: abdomen soft. Bowel sounds rare. Slightly distended. Wounds looked fine.  Lab Results:  No results found for this or any previous visit (from the past 24 hour(s)).   Studies/Results: Ct Soft Tissue Neck W Contrast  10/14/2013   CLINICAL DATA:  Possible foreign body overlying the lower neck on recent chest x-ray. Prior history of radiation therapy to the thyroid gland.  EXAM: CT NECK WITH CONTRAST  TECHNIQUE: Multidetector CT imaging of the neck was performed using the standard protocol following the bolus administration of intravenous contrast.  CONTRAST:  91mL OMNIPAQUE IOHEXOL 300 MG/ML IV.  COMPARISON:  Soft tissue neck x-rays yesterday. Two-view chest x-ray 10/06/2013.  FINDINGS: Patient motion blurs many of the images. No radiopaque foreign body within the neck. No neck masses or significant lymphadenopathy. Normal and symmetric appearing bilateral submandibular and parotid salivary glands. Thyroid gland atrophic without evidence of mass. Larynx and pharynx unremarkable, though I suspect the patient was talking during the examination is there is significant motion involving the larynx. Mild atherosclerosis  involving the right carotid bulb without significant atherosclerosis elsewhere.  Visualized lung apices severely emphysematous but clear. Bone window images demonstrate multilevel severe degenerative disc disease and spondylosis throughout the cervical spine with moderate spinal stenosis at C3-4. Mucous retention cysts or polyps in the left maxillary and sphenoid sinuses; remaining visualized paranasal sinuses, bilateral mastoid air cells and bilateral middle ear cavities well aerated.  IMPRESSION: 1. No radiopaque foreign body within the neck as questioned on the prior chest x-ray. 2. No neck mass or significant lymphadenopathy. 3. Severe emphysema involving the visualized lung apices. 4. Multilevel degenerative disc disease and spondylosis throughout the cervical spine with moderate spinal stenosis at C3-4. 5. Chronic left maxillary and left sphenoid sinus disease.   Electronically Signed   By: Evangeline Dakin M.D.   On: 10/14/2013 15:01    . aspirin EC  81 mg Oral BID  . cholecalciferol  2,000 Units Oral Daily  . heparin subcutaneous  5,000 Units Subcutaneous 3 times per day  . levothyroxine  137 mcg Oral QAC breakfast  . lip balm   Topical BID  . loratadine  10 mg Oral Daily  . polycarbophil  625 mg Oral Daily  . saccharomyces boulardii  250 mg Oral BID  . scopolamine  1 patch Transdermal Once  . sodium chloride  3 mL Intravenous Q12H  . vitamin C  1,000 mg Oral Daily     Assessment/Plan: s/p Procedure(s): REPAIR OF VENTRAL WALL HERNIA WITH UNILATERAL COMPARTMENT SEPARATION INSERTION OF MESH LAPAROSCOPIC LYSIS OF ADHESIONS  POD#2 - Ventral hernia repair with mesh, unilateral component separation, extensive lysis of adhesions  Stable  Dc foley Still has ileus, not surprising considering extent of lysis of  adhesions. Continue liquid diet. Check labs today.  Question of soft tissue density of neck, tracheal area, Not seen on CT. Artifactual. Patient informed.   VTE  proph-heparin   @PROBHOSP @  LOS: 2 days    Preston Reeves M 10/15/2013  . .prob

## 2013-10-16 ENCOUNTER — Encounter (HOSPITAL_COMMUNITY): Payer: Self-pay | Admitting: Surgery

## 2013-10-16 DIAGNOSIS — K439 Ventral hernia without obstruction or gangrene: Secondary | ICD-10-CM | POA: Diagnosis present

## 2013-10-16 DIAGNOSIS — J449 Chronic obstructive pulmonary disease, unspecified: Secondary | ICD-10-CM | POA: Diagnosis present

## 2013-10-16 DIAGNOSIS — K560 Paralytic ileus: Secondary | ICD-10-CM | POA: Diagnosis not present

## 2013-10-16 DIAGNOSIS — J4489 Other specified chronic obstructive pulmonary disease: Secondary | ICD-10-CM | POA: Diagnosis present

## 2013-10-16 DIAGNOSIS — K660 Peritoneal adhesions (postprocedural) (postinfection): Secondary | ICD-10-CM | POA: Diagnosis present

## 2013-10-16 DIAGNOSIS — Z932 Ileostomy status: Secondary | ICD-10-CM | POA: Diagnosis not present

## 2013-10-16 MED ORDER — BUPIVACAINE 0.25 % ON-Q PUMP DUAL CATH 300 ML
300.0000 mL | INJECTION | Status: DC
  Filled 2013-10-16: qty 300

## 2013-10-16 MED ORDER — LACTATED RINGERS IV BOLUS (SEPSIS): 1000.0000 mL | Freq: Three times a day (TID) | INTRAVENOUS | Status: DC | PRN

## 2013-10-16 MED ORDER — BISACODYL 10 MG RE SUPP
10.0000 mg | Freq: Every day | RECTAL | Status: DC
  Administered 2013-10-16: 10 mg via RECTAL
  Filled 2013-10-16 (×2): qty 1

## 2013-10-16 MED ORDER — CALCIUM POLYCARBOPHIL 625 MG PO TABS
625.0000 mg | ORAL_TABLET | Freq: Two times a day (BID) | ORAL | Status: DC
  Administered 2013-10-16 – 2013-10-17 (×2): 625 mg via ORAL
  Filled 2013-10-16 (×5): qty 1

## 2013-10-16 NOTE — Progress Notes (Signed)
[address] [place], [place] [address] Phone: [phone] FAX: [place] [phone] 10/16/1940  CARE TEAM:  PCP: Jenny Reichmann, MD  Outpatient Care Team: Patient Care Team: Darlyne Russian, MD as PCP - General (Family Medicine) Raynelle Bring, MD as Consulting Physician (Urology) Irene Shipper, MD as Consulting Physician (Gastroenterology) Darlin Coco, MD as Consulting Physician (Cardiology)  Inpatient Treatment Team: Treatment Team: Attending Provider: Michael Boston, MD; Registered Nurse: Kennith Center, RN; Technician: Thomas Hoff, NT; Registered Nurse: Colin Broach, RN; Technician: Deedra Ehrich, NT; Technician: Lindajo Royal, NT   Subjective:  Sore in the pain medicines working.  Walking the hallways.  Wife and nurse in room.    Objective:  Vital signs:  Filed Vitals:   10/15/13 0618 10/15/13 1342 10/15/13 2221 10/16/13 0541  BP: 168/89 152/81 138/78 138/74  Pulse: 86 94 93 77  Temp: 97.6 F (36.4 C) 97.9 F (36.6 C) 98.6 F (37 C) 98 F (36.7 C)  TempSrc: Oral Oral Oral   Resp: _0 Height:      Weight:      SpO2: 92% 94% 95% 94%    Last BM Date: 10/13/13  Intake/Output   Yesterday:  09/20 0701 - 09/21 0700 In: 1935 [P.O.:1200; I.V.:735] Out: 2375 [Urine:2050; Drains:325] This shift:  Total I/O In: -  Out: 300 [Urine:300]  Bowel function:  Flatus: n  BM: n  Drain: serosanguinous DRAINS: (19Fr) Blake drain(s) in the abdomional wall.  LUQ = SQ LLQ = preperitoneal    Physical Exam:  General: Pt awake/alert/oriented x4 in no acute distress Eyes: PERRL, normal EOM.  Sclera clear.  No icterus Neuro: CN II-XII intact w/o focal sensory/motor deficits. Lymph: No head/neck/groin lymphadenopathy Psych:  No delerium/psychosis/paranoia HENT: Normocephalic, Mucus membranes moist.  No thrush Neck: Supple, No tracheal deviation Chest: No chest  wall pain w good excursion CV:  Pulses intact.  Regular rhythm MS: Normal AROM mjr joints.  No obvious deformity Abdomen: Soft.  Nondistended.  Mildly tender at incisions only.  No evidence of peritonitis.  No incarcerated hernias. Ext:  SCDs BLE.  No mjr edema.  No cyanosis Skin: No petechiae / purpura   Problem List:   Principal Problem:   Incisional hernias s/p lap/open VWH repair w mesh Active Problems:   COPD (chronic obstructive pulmonary disease)   Other postablative hypothyroidism   Ventral hernia   Assessment  [name]  73 y.o. male  3 Days Post-Op  Procedure(s):  POST-OPERATIVE DIAGNOSIS: Incisional Abdominal Wall Hernias  PROCEDURE: Procedure(s):  LAPAROSCOPIC LYSIS OF ADHESIONS x 13mn  UNILATERAL COMPONENT SEPARATION (anterior right anterior external oblique fascia release)  REPAIR OF VENTRAL WALL HERNIA WITH  INSERTION OF MESH  SURGEON: Surgeon(s):  SMichael Boston MD      Recovering  Plan:  -adv solids -bowel regimen -renew OnQ -VTE prophylaxis- SCDs, etc -mobilize as tolerated to help recovery  D/C patient from hospital when patient meets criteria (anticipate in 1-2 day(s)):  Tolerating oral intake well Ambulating in walkways Adequate pain control without IV medications Urinating  Having flatus  I updated the status of the patient to the patient, his wife, & his nurse.  I made recommendations.  I answered questions.  Understanding & appreciation was expressed.    SAdin Hector M.D., F.A.C.S. [facility] [address] [place]  [address] [phone] Main / Paging   10/16/2013   Results:   Labs: Results for orders placed during the hospital encounter of 10/13/13 (from the past 48 hour(s))  CBC     Status: Abnormal   Collection Time    10/15/13 11:55 AM      Result Value Ref Range   WBC 8.0  4.0 - 10.5 K/uL   RBC 4.05 (*) 4.22 -  5.81 MIL/uL   Hemoglobin 12.3 (*) 13.0 - 17.0 g/dL   HCT 36.3 (*) 39.0 - 52.0 %   MCV 89.6  78.0 - 100.0 fL   MCH 30.4  26.0 - 34.0 pg   MCHC 33.9  30.0 - 36.0 g/dL   RDW 14.5  11.5 - 15.5 %   Platelets 238  150 - 400 K/uL  BASIC METABOLIC PANEL     Status: Abnormal   Collection Time    10/15/13 11:55 AM      Result Value Ref Range   Sodium 136 (*) 137 - 147 mEq/L   Potassium 4.2  3.7 - 5.3 mEq/L   Chloride 97  96 - 112 mEq/L   CO2 29  19 - 32 mEq/L   Glucose, Bld 89  70 - 99 mg/dL   BUN 11  6 - 23 mg/dL   Creatinine, Ser 0.79  0.50 - 1.35 mg/dL   Calcium 9.3  8.4 - 10.5 mg/dL   GFR calc non Af Amer 87 (*) >90 mL/min   GFR calc Af Amer >90  >90 mL/min   Comment: (NOTE)     The eGFR has been calculated using the CKD EPI equation.     This calculation has not been validated in all clinical situations.     eGFR's persistently <90 mL/min signify possible Chronic Kidney     Disease.   Anion gap 10  5 - 15    Imaging / Studies: Ct Soft Tissue Neck W Contrast  10/14/2013   CLINICAL DATA:  Possible foreign body overlying the lower neck on recent chest x-ray. Prior history of radiation therapy to the thyroid gland.  EXAM: CT NECK WITH CONTRAST  TECHNIQUE: Multidetector CT imaging of the neck was performed using the standard protocol following the bolus administration of intravenous contrast.  CONTRAST:  75mL OMNIPAQUE IOHEXOL 300 MG/ML IV.  COMPARISON:  Soft tissue neck x-rays yesterday. Two-view chest x-ray 10/06/2013.  FINDINGS: Patient motion blurs many of the images. No radiopaque foreign body within the neck. No neck masses or significant lymphadenopathy. Normal and symmetric appearing bilateral submandibular and parotid salivary glands. Thyroid gland atrophic without evidence of mass. Larynx and pharynx unremarkable, though I suspect the patient was talking during the examination is there is significant motion involving the larynx. Mild atherosclerosis involving the right carotid bulb  without significant atherosclerosis elsewhere.  Visualized lung apices severely emphysematous but clear. Bone window images demonstrate multilevel severe degenerative disc disease and spondylosis throughout the cervical spine with moderate spinal stenosis at C3-4. Mucous retention cysts or polyps in the left maxillary and sphenoid sinuses; remaining visualized paranasal sinuses, bilateral mastoid air cells and bilateral middle ear cavities well aerated.  IMPRESSION: 1. No radiopaque foreign body within the neck as questioned on the prior chest x-ray. 2. No neck mass or significant lymphadenopathy. 3. Severe emphysema involving the visualized lung apices. 4. Multilevel degenerative disc disease and spondylosis throughout the cervical spine with moderate spinal stenosis at C3-4. 5. Chronic left maxillary and left sphenoid sinus disease.   Electronically Signed     By: Evangeline Dakin M.D.   On: 10/14/2013 15:01    Medications / Allergies: per chart  Antibiotics: Anti-infectives   Start     Dose/Rate Route Frequency Ordered Stop   10/13/13 1800  ceFAZolin (ANCEF) IVPB 1 g/50 mL premix     1 g 100 mL/hr over 30 Minutes Intravenous Every 6 hours 10/13/13 1637 10/14/13 0602   10/13/13 1400  ceFAZolin (ANCEF) IVPB 2 g/50 mL premix  Status:  Discontinued     2 g 100 mL/hr over 30 Minutes Intravenous 3 times per day 10/13/13 1322 10/13/13 1609   10/13/13 0600  ceFAZolin (ANCEF) IVPB 2 g/50 mL premix     2 g 100 mL/hr over 30 Minutes Intravenous On call to O.R. 10/12/13 1512 10/13/13 1200   10/13/13 0600  metroNIDAZOLE (FLAGYL) IVPB 500 mg    Comments:  Pharmacy may adjust dosing strength, interval, or rate of medication as needed for optimal therapy for the patient Send with patient on call to the OR.  Anesthesia to complete antibiotic administration <10mn [facility]   500 mg 100 mL/hr over 60 Minutes Intravenous On call to O.R. 10/12/13 1512 10/13/13 0750        Note: Portions of this report may have been transcribed using voice recognition software. Every effort was made to ensure accuracy; however, inadvertent computerized transcription errors may be present.   Any transcriptional errors that result from this process are unintentional.

## 2013-10-17 NOTE — Discharge Instructions (Signed)
Patient Education Sheet  °ON-Q Pain Relief System  ° °What is the ON-Q?  °The ON-Q is a balloon-type pump filled with a medicine to treat your pain. It blocks the pain in the area of your procedure. With ON-Q, you may have better pain relief and need less pain medicine. Its small size allows you to move around freely.  °How does the ON-Q work?  °The pump is attached to a catheter (small tube) near your procedure site. The pump automatically pushes the medicine through the tube. DO NOT squeeze the pump. The pump may be clipped to your clothing or dressing or placed in a small carrying case.  ° °How do I know the pump is working?  °The pump gives your medicine very slowly. It may take longer than 24 hours after your procedure to notice a change in the size and look of the pump. Your pump may look like the picture.  °In time, the outside bag on the pump will get looser and wrinkles will form in the bag.  °Talk to your doctor about taking other pain medication as needed.  °Check the following:  °o Make sure the white clamp on the tubing is open (moves freely on the tubing).  °o Make sure there are no kinks in the pump tubing.  °o Do not tape or cover the filter.  ° °What should I do with my ON-Q when sleeping?  °Make sure the pump is placed on a bedside table or on top of bed covers.  °Do not place the pump underneath the bed covers where the pump may become too warm.  °Do not place the pump on the floor or hang the pump on a bedpost.  ° °Can I take a shower with ON-Q?  °Your doctor will tell you when it’s ok for you to shower.  °Take care to protect the catheter site, pump and filter from water.  °Do not submerge the pump in water.  ° °How long will my ON-Q last?  °Depending on the size of your pump, it may take 4-5 days to give you all the medicine. All your medicine has been delivered when the outside bag is flat and a hard tube can be felt in the middle of the pump.  ° °Troubleshooting  °Tubing Disconnection  °If  the pump tubing becomes disconnected from your catheter, Remove all dressings and catheters and throw the On-Q pump away ° °Leaking  °If leaking from the pump or pump tubing occurs, then remove all dressings and catheters and throw the On-Q pump away °  °When should I call the surgeon?  °Be aware that you may experience loss of feeling (numbness) at and around the area of your procedure. If this occurs, be careful not to hurt yourself. Be careful when placing hot or cold items on the numb area.  °The following symptoms may represent a serious medical condition.  °Immediately close the clamp on the pump tubing and call your doctor or 911 in case of emergency.  °Increase in pain  °Fever, chills, sweats  °Bowel or bladder changes  °Difficulty breathing  °Redness, warmth, or discharge or excessive bleeding from the catheter site  °Dizziness or lightheadedness  °Blurred vision  °Ringing or buzzing in your ears  °Metal taste in your mouth  °Numbness or tingling around your mouth, fingers, or toes  °Drowsiness  °Confusion  ° °How to remove the ON-Q catheter?  °If your doctor has instructed you to remove the ON-Q catheter, follow   their instructions keeping in mind these steps:  -Remove the dressing covering the catheter site.  -Remove any skin adhesive strips.  -Grasp the catheter close to the skin and gently pull on the catheter. -It should be easy to remove and not painful.  -If it becomes difficult STOP and call your doctor.  -Do not cut or pull hard to remove the catheter.  -Once removed, check the catheter tip for a black marking to ensure the entire catheter was removed. Call your doctor if you do not see the black marking.  Place bandage over the catheter site as instructed by your doctor.   Source: [employer], ON-Q, and [employer] are registered trademarks and [employer] and [employer] are trademarks of [employer].  2011 [employer]. All right reserved.  10/  DRAIN CARE:   You  have a closed bulb drain to help you heal.  A bulb drain is a small, plastic reservoir which creates a gentle suction. It is used to remove excess fluid from a surgical wound. The color and amount of fluid will vary. Immediately after surgery, the fluid is bright red. It may gradually change to a yellow color. When the amount decreases to about 1 or 2 tablespoons (15 to 30 cc) per 24 hours, your caregiver will usually remove it.  DAILY CARE  Keep the bulb compressed at all times, except while emptying it. The compression creates suction.   Keep sites where the tubes enter the skin dry and covered with a light bandage (dressing).   Tape the tubes to your skin, 1 to 2 inches below the insertion sites, to keep from pulling on your stitches. Tubes are stitched in place and will not slip out.   Pin the bulb to your shirt (not to your pants) with a safety pin.   For the first few days after surgery, there usually is more fluid in the bulb. Empty the bulb whenever it becomes half full because the bulb does not create enough suction if it is too full. Include this amount in your 24 hour totals.   When the amount of drainage decreases, empty the bulb at the same time every day. Write down the amounts and the 24 hour totals. Your caregiver will want to know them. This helps your caregiver know when the tubes can be removed.   (We anticipate removing the drain in 1-3 weeks, depending on when the output is <17mL a day for 2+ days)  If there is drainage around the tube sites, change dressings and keep the area dry. If you see a clot in the tube, leave it alone. However, if the tube does not appear to be draining, let your caregiver know.  TO EMPTY THE BULB  Open the stopper to release suction.   Holding the stopper out of the way, pour drainage into the measuring cup that was sent home with you.   Measure and write down the amount. If there are 2 bulbs, note the amount of drainage from bulb 1 or bulb 2  and keep the totals separate. Your caregiver will want to know which tube is draining more.   Compress the bulb by folding it in half.   Replace the stopper.   Check the tape that holds the tube to your skin, and pin the bulb to your shirt.  SEEK MEDICAL CARE IF:  The drainage develops a bad odor.   You have an oral temperature above 102 F (38.9 C).   The amount of drainage  from your wound suddenly increases or decreases.   You accidentally pull out your drain.   You have any other questions or concerns.  MAKE SURE YOU:   Understand these instructions.   Will watch your condition.   Will get help right away if you are not doing well or get worse.     Call our office if you have any questions about your drain. [phone]     HERNIA REPAIR: POST OP INSTRUCTIONS  1. DIET: Follow a light bland diet the first 24 hours after arrival home, such as soup, liquids, crackers, etc.  Be sure to include lots of fluids daily.  Avoid fast food or heavy meals as your are more likely to get nauseated.  Eat a low fat the next few days after surgery. 2. Take your usually prescribed home medications unless otherwise directed. 3. PAIN CONTROL: a. Pain is best controlled by a usual combination of three different methods TOGETHER: i. Ice/Heat ii. Over the counter pain medication iii. Prescription pain medication b. Most patients will experience some swelling and bruising around the hernia(s) such as the bellybutton, groins, or old incisions.  Ice packs or heating pads (30-60 minutes up to 6 times a day) will help. Use ice for the first few days to help decrease swelling and bruising, then switch to heat to help relax tight/sore spots and speed recovery.  Some people prefer to use ice alone, heat alone, alternating between ice & heat.  Experiment to what works for you.  Swelling and bruising can take several weeks to resolve.   c. It is helpful to take an over-the-counter pain medication  regularly for the first few weeks.  Choose one of the following that works best for you: i. Naproxen (Aleve, etc)  Two 220mg  tabs twice a day ii. Ibuprofen (Advil, etc) Three 200mg  tabs four times a day (every meal & bedtime) iii. Acetaminophen (Tylenol, etc) 325-650mg  four times a day (every meal & bedtime) d. A  prescription for pain medication should be given to you upon discharge.  Take your pain medication as prescribed.  i. If you are having problems/concerns with the prescription medicine (does not control pain, nausea, vomiting, rash, itching, etc), please call us [phone] to see if we need to switch you to a different pain medicine that will work better for you and/or control your side effect better. ii. If you need a refill on your pain medication, please contact your pharmacy.  They will contact our office to request authorization. Prescriptions will not be filled after 5 pm or on week-ends. 4. Avoid getting constipated.  Between the surgery and the pain medications, it is common to experience some constipation.  Increasing fluid intake and taking a fiber supplement (such as Metamucil, Citrucel, FiberCon, MiraLax, etc) 1-2 times a day regularly will usually help prevent this problem from occurring.  A mild laxative (prune juice, Milk of Magnesia, MiraLax, etc) should be taken according to package directions if there are no bowel movements after 48 hours.   5. Wash / shower every day.  You may shower over the dressings as they are waterproof.   6. Remove your waterproof bandages 5 days after surgery.  You may leave the incision open to air.  You may replace a dressing/Band-Aid to cover the incision for comfort if you wish.  Continue to shower over incision(s) after the dressing is off.    7. ACTIVITIES as tolerated:   a. You may resume regular (light) daily activities beginning  the next day--such as daily self-care, walking, climbing stairs--gradually increasing activities as tolerated.   If you can walk 30 minutes without difficulty, it is safe to try more intense activity such as jogging, treadmill, bicycling, low-impact aerobics, swimming, etc. b. Save the most intensive and strenuous activity for last such as sit-ups, heavy lifting, contact sports, etc  Refrain from any heavy lifting or straining until you are off narcotics for pain control.   c. DO NOT PUSH THROUGH PAIN.  Let pain be your guide: If it hurts to do something, don't do it.  Pain is your body warning you to avoid that activity for another week until the pain goes down. d. You may drive when you are no longer taking prescription pain medication, you can comfortably wear a seatbelt, and you can safely maneuver your car and apply brakes. e. Dennis Bast may have sexual intercourse when it is comfortable.  8. FOLLOW UP in our office a. Please call [facility] at [phone] to set up an appointment to see your surgeon in the office for a follow-up appointment approximately 2-3 weeks after your surgery. b. Make sure that you call for this appointment the day you arrive home to insure a convenient appointment time. 9.  IF YOU HAVE DISABILITY OR FAMILY LEAVE FORMS, BRING THEM TO THE OFFICE FOR PROCESSING.  DO NOT GIVE THEM TO YOUR DOCTOR.  WHEN TO CALL us [phone]: 1. Poor pain control 2. Reactions / problems with new medications (rash/itching, nausea, etc)  3. Fever over 101.5 F (38.5 C) 4. Inability to urinate 5. Nausea and/or vomiting 6. Worsening swelling or bruising 7. Continued bleeding from incision. 8. Increased pain, redness, or drainage from the incision   The clinic staff is available to answer your questions during regular business hours (8:30am-5pm).  Please dont hesitate to call and ask to speak to one of our nurses for clinical concerns.   If you have a medical emergency, go to the nearest emergency room or call 911.  A surgeon from [facility] is always on call at the hospitals in  [facility], [place], [place], [place], [place]  [address] ?  [address] [address], [place], [place]   [address] MAIN: [phone] ? TOLL FREE: [phone] ? FAX: [phone] [email or web address]     Managing Pain  Pain after surgery or related to activity is often due to strain/injury to muscle, tendon, nerves and/or incisions.  This pain is usually short-term and will improve in a few months.   Many people find it helpful to do the following things TOGETHER to help speed the process of healing and to get back to regular activity more quickly:  1. Avoid heavy physical activity a.  no lifting greater than 20 pounds b. Do not push through the pain.  Listen to your body and avoid positions and maneuvers than reproduce the pain c. Walking is okay as tolerated, but go slowly and stop when getting sore.  d. Remember: If it hurts to do it, then dont do it! 2. Take Anti-inflammatory medication  a. Take with food/snack around the clock for 1-2 weeks i. This helps the muscle and nerve tissues become less irritable and calm down faster b. Choose ONE of the following over-the-counter medications: i. Naproxen 220mg  tabs (ex. Aleve) 1-2 pills twice a day  ii. Ibuprofen 200mg  tabs (ex. Advil, Motrin) 3-4 pills with every meal and just before bedtime iii. Acetaminophen 500mg  tabs (Tylenol) 1-2 pills with  every meal and just before bedtime 3. Use a Heating pad or Ice/Cold Pack a. 4-6 times a day b. May use warm bath/hottub  or showers 4. Try Gentle Massage and/or Stretching  a. at the area of pain many times a day b. stop if you feel pain - do not overdo it  Try these steps together to help you body heal faster and avoid making things get worse.  Doing just one of these things may not be enough.    If you are not getting better after two weeks or are noticing you are getting worse, contact our office for further advice; we may need to  re-evaluate you & see what other things we can do to help.   GETTING TO GOOD BOWEL HEALTH. Irregular bowel habits such as constipation and diarrhea can lead to many problems over time.  Having one soft bowel movement a day is the most important way to prevent further problems.  The anorectal canal is designed to handle stretching and feces to safely manage our ability to get rid of solid waste (feces, poop, stool) out of our body.  BUT, hard constipated stools can act like ripping concrete bricks and diarrhea can be a burning fire to this very sensitive area of our body, causing inflamed hemorrhoids, anal fissures, increasing risk is perirectal abscesses, abdominal pain/bloating, an making irritable bowel worse.     The goal: ONE SOFT BOWEL MOVEMENT A DAY!  To have soft, regular bowel movements:    Drink at least 8 tall glasses of water a day.     Take plenty of fiber.  Fiber is the undigested part of plant food that passes into the colon, acting s natures broom to encourage bowel motility and movement.  Fiber can absorb and hold large amounts of water. This results in a larger, bulkier stool, which is soft and easier to pass. Work gradually over several weeks up to 6 servings a day of fiber (25g a day even more if needed) in the form of: o Vegetables -- Root (potatoes, carrots, turnips), leafy green (lettuce, salad greens, celery, spinach), or cooked high residue (cabbage, broccoli, etc) o Fruit -- Fresh (unpeeled skin & pulp), Dried (prunes, apricots, cherries, etc ),  or stewed ( applesauce)  o Whole grain breads, pasta, etc (whole wheat)  o Bran cereals    Bulking Agents -- This type of water-retaining fiber generally is easily obtained each day by one of the following:  o Psyllium bran -- The psyllium plant is remarkable because its ground seeds can retain so much water. This product is available as Metamucil, Konsyl, Effersyllium, Per Diem Fiber, or the less expensive generic preparation in drug  and health food stores. Although labeled a laxative, it really is not a laxative.  o Methylcellulose -- This is another fiber derived from wood which also retains water. It is available as Citrucel. o Polyethylene Glycol - and artificial fiber commonly called Miralax or Glycolax.  It is helpful for people with gassy or bloated feelings with regular fiber o Flax Seed - a less gassy fiber than psyllium   No reading or other relaxing activity while on the toilet. If bowel movements take longer than 5 minutes, you are too constipated   AVOID CONSTIPATION.  High fiber and water intake usually takes care of this.  Sometimes a laxative is needed to stimulate more frequent bowel movements, but    Laxatives are not a good long-term solution as it can wear the colon  out. o Osmotics (Milk of Magnesia, Fleets phosphosoda, Magnesium citrate, MiraLax, GoLytely) are safer than  o Stimulants (Senokot, Castor Oil, Dulcolax, Ex Lax)    o Do not take laxatives for more than 7days in a row.    IF SEVERELY CONSTIPATED, try a Bowel Retraining Program: o Do not use laxatives.  o Eat a diet high in roughage, such as bran cereals and leafy vegetables.  o Drink six (6) ounces of prune or apricot juice each morning.  o Eat two (2) large servings of stewed fruit each day.  o Take one (1) heaping tablespoon of a psyllium-based bulking agent twice a day. Use sugar-free sweetener when possible to avoid excessive calories.  o Eat a normal breakfast.  o Set aside 15 minutes after breakfast to sit on the toilet, but do not strain to have a bowel movement.  o If you do not have a bowel movement by the third day, use an enema and repeat the above steps.    Controlling diarrhea o Switch to liquids and simpler foods for a few days to avoid stressing your intestines further. o Avoid dairy products (especially milk & ice cream) for a short time.  The intestines often can lose the ability to digest lactose when stressed. o Avoid  foods that cause gassiness or bloating.  Typical foods include beans and other legumes, cabbage, broccoli, and dairy foods.  Every person has some sensitivity to other foods, so listen to our body and avoid those foods that trigger problems for you. o Adding fiber (Citrucel, Metamucil, psyllium, Miralax) gradually can help thicken stools by absorbing excess fluid and retrain the intestines to act more normally.  Slowly increase the dose over a few weeks.  Too much fiber too soon can backfire and cause cramping & bloating. o Probiotics (such as active yogurt, Align, etc) may help repopulate the intestines and colon with normal bacteria and calm down a sensitive digestive tract.  Most studies show it to be of mild help, though, and such products can be costly. o Medicines:   Bismuth subsalicylate (ex. Kayopectate, Pepto Bismol) every 30 minutes for up to 6 doses can help control diarrhea.  Avoid if pregnant.   Loperamide (Immodium) can slow down diarrhea.  Start with two tablets (4mg  total) first and then try one tablet every 6 hours.  Avoid if you are having fevers or severe pain.  If you are not better or start feeling worse, stop all medicines and call your doctor for advice o Call your doctor if you are getting worse or not better.  Sometimes further testing (cultures, endoscopy, X-ray studies, bloodwork, etc) may be needed to help diagnose and treat the cause of the diarrhea.   Hernia A hernia occurs when an internal organ pushes out through a weak spot in the abdominal wall. Hernias most commonly occur in the groin and around the navel. Hernias often can be pushed back into place (reduced). Most hernias tend to get worse over time. Some abdominal hernias can get stuck in the opening (irreducible or incarcerated hernia) and cannot be reduced. An irreducible abdominal hernia which is tightly squeezed into the opening is at risk for impaired blood supply (strangulated hernia). A strangulated hernia is a  medical emergency. Because of the risk for an irreducible or strangulated hernia, surgery may be recommended to repair a hernia. CAUSES   Heavy lifting.  Prolonged coughing.  Straining to have a bowel movement.  A cut (incision) made during an abdominal surgery. HOME  CARE INSTRUCTIONS   Bed rest is not required. You may continue your normal activities.  Avoid lifting more than 10 pounds (4.5 kg) or straining.  Cough gently. If you are a smoker it is best to stop. Even the best hernia repair can break down with the continual strain of coughing. Even if you do not have your hernia repaired, a cough will continue to aggravate the problem.  Do not wear anything tight over your hernia. Do not try to keep it in with an outside bandage or truss. These can damage abdominal contents if they are trapped within the hernia sac.  Eat a normal diet.  Avoid constipation. Straining over long periods of time will increase hernia size and encourage breakdown of repairs. If you cannot do this with diet alone, stool softeners may be used. SEEK IMMEDIATE MEDICAL CARE IF:   You have a fever.  You develop increasing abdominal pain.  You feel nauseous or vomit.  Your hernia is stuck outside the abdomen, looks discolored, feels hard, or is tender.  You have any changes in your bowel habits or in the hernia that are unusual for you.  You have increased pain or swelling around the hernia.  You cannot push the hernia back in place by applying gentle pressure while lying down. MAKE SURE YOU:   Understand these instructions.  Will watch your condition.  Will get help right away if you are not doing well or get worse. Document Released: 01/12/2005 Document Revised: 04/06/2011 Document Reviewed: 09/01/2007 [facility] Patient Information 2015 [place], [place]. This information is not intended to replace advice given to you by your health care provider. Make sure you discuss any questions you have with your  health care provider.

## 2013-10-17 NOTE — Discharge Summary (Signed)
Physician Discharge Summary  Patient ID: Preston Reeves MRN: [id number] DOB/AGE: 1940/02/02 73 y.o.  Admit date: 10/13/2013 Discharge date: 10/17/2013  Admission Diagnoses:  Discharge Diagnoses:  Principal Problem:   Incisional hernias s/p lap/open VWH repair w mesh Active Problems:   COPD (chronic obstructive pulmonary disease)   Other postablative hypothyroidism   Ventral hernia   POST-OPERATIVE DIAGNOSIS: Incisional Abdominal Wall Hernias  PROCEDURE: Procedure(s):  LAPAROSCOPIC LYSIS OF ADHESIONS x 66mn  UNILATERAL COMPONENT SEPARATION (anterior right anterior external oblique fascia release)  REPAIR OF VENTRAL WALL HERNIA WITH  INSERTION OF MESH  SURGEON: Surgeon(s):  SMichael Boston MD   Discharged Condition: good  Hospital Course: The patient underwent the surgery above.  Postoperatively, the patient gradually mobilized in the hallways and advanced to a solid diet.  Pain and other symptoms  were treated aggressively.    By the time of discharge, the patient was walking well the hallways, eating food well, having flatus.  Pain was well-controlled on an oral regimen.  Based on meeting discharge criteria and continuing to recover, I felt it was safe for the patient to be discharged from the hospital with close followup.  Instructions were discussed in detail.  They are written as well.     Consults: None  Significant Diagnostic Studies:   Results for orders placed during the hospital encounter of 10/13/13 (from the past 72 hour(s))  CBC     Status: Abnormal   Collection Time    10/15/13 11:55 AM      Result Value Ref Range   WBC 8.0  4.0 - 10.5 K/uL   RBC 4.05 (*) 4.22 - 5.81 MIL/uL   Hemoglobin 12.3 (*) 13.0 - 17.0 g/dL   HCT 36.3 (*) 39.0 - 52.0 %   MCV 89.6  78.0 - 100.0 fL   MCH 30.4  26.0 - 34.0 pg   MCHC 33.9  30.0 - 36.0 g/dL   RDW 14.5  11.5 - 15.5 %   Platelets 238  150 - 400 K/uL  BASIC METABOLIC PANEL     Status: Abnormal   Collection Time     10/15/13 11:55 AM      Result Value Ref Range   Sodium 136 (*) 137 - 147 mEq/L   Potassium 4.2  3.7 - 5.3 mEq/L   Chloride 97  96 - 112 mEq/L   CO2 29  19 - 32 mEq/L   Glucose, Bld 89  70 - 99 mg/dL   BUN 11  6 - 23 mg/dL   Creatinine, Ser 0.79  0.50 - 1.35 mg/dL   Calcium 9.3  8.4 - 10.5 mg/dL   GFR calc non Af Amer 87 (*) >90 mL/min   GFR calc Af Amer >90  >90 mL/min   Comment: (NOTE)     The eGFR has been calculated using the CKD EPI equation.     This calculation has not been validated in all clinical situations.     eGFR's persistently <90 mL/min signify possible Chronic Kidney     Disease.   Anion gap 10  5 - 15    Discharge Exam: Blood pressure 115/82, pulse 66, temperature 98.8 F (37.1 C), temperature source Oral, resp. rate 17, height '5\' 11"'  (1.803 m), weight 182 lb 11.2 oz (82.872 kg), SpO2 99.00%.   General: Pt awake/alert/oriented x4 in no acute distress  Eyes: PERRL, normal EOM. Sclera clear. No icterus  Neuro: CN II-XII intact w/o focal sensory/motor deficits.  Lymph: No head/neck/groin lymphadenopathy  Psych: No  delerium/psychosis/paranoia  HENT: Normocephalic, Mucus membranes moist. No thrush  Neck: Supple, No tracheal deviation  Chest: No chest wall pain w good excursion  CV: Pulses intact. Regular rhythm  MS: Normal AROM mjr joints. No obvious deformity  Abdomen: Soft. Nondistended. Mildly tender at incisions only. No evidence of peritonitis. No incarcerated hernias.  Ext: SCDs BLE. No mjr edema. No cyanosis  Skin: No petechiae / purpura  Disposition:   Discharge Instructions   Call MD for:  extreme fatigue    Complete by:  As directed      Call MD for:  hives    Complete by:  As directed      Call MD for:  persistant nausea and vomiting    Complete by:  As directed      Call MD for:  redness, tenderness, or signs of infection (pain, swelling, redness, odor or green/yellow discharge around incision site)    Complete by:  As directed      Call MD  for:  severe uncontrolled pain    Complete by:  As directed      Call MD for:    Complete by:  As directed   Temperature > 101.43F     Diet - low sodium heart healthy    Complete by:  As directed      Discharge instructions    Complete by:  As directed   Please see discharge instruction sheets.  Also refer to handout given an office.  Please call our office if you have any questions or concerns [phone]     Discharge wound care:    Complete by:  As directed   If you have closed incisions, shower and bathe over these incisions with soap and water every day.  Remove all surgical dressings on postoperative day #3.  You do not need to replace dressings over the closed incisions unless you feel more comfortable with a Band-Aid covering it.   If you have an open wound that requires packing, please see wound care instructions.  In general, remove all dressings, wash wound with soap and water and then replace with saline moistened gauze.  Do the dressing change at least every day.  Please call our office [phone] if you have further questions.     Driving Restrictions    Complete by:  As directed   No driving until off narcotics and can safely swerve away without pain during an emergency     Increase activity slowly    Complete by:  As directed   Walk an hour a day.  Use 20-30 minute walks.  When you can walk 30 minutes without difficulty, increase to low impact/moderate activities such as biking, jogging, swimming, sexual activity..  Eventually can increase to unrestricted activity when not feeling pain.  If you feel pain: STOP!Marland Kitchen   Let pain protect you from overdoing it.  Use ice/heat/over-the-counter pain medications to help minimize his soreness.  Use pain prescriptions as needed to remain active.  It is better to take extra pain medications and be more active than to stay bedridden to avoid all pain medications.     Lifting restrictions    Complete by:  As directed   Avoid heavy lifting  initially.  Do not push through pain.  You have no specific weight limit.  Coughing and sneezing or four more stressful to your incision than any lifting you will do. Pain will protect you from injury.  Therefore, avoid intense activity until off all narcotic  pain medications.  Coughing and sneezing or four more stressful to your incision than any lifting he will do.     May shower / Bathe    Complete by:  As directed      May walk up steps    Complete by:  As directed      Sexual Activity Restrictions    Complete by:  As directed   Sexual activity as tolerated.  Do not push through pain.  Pain will protect you from injury.     Walk with assistance    Complete by:  As directed   Walk over an hour a day.  May use a walker/cane/companion to help with balance and stamina.            Medication List         aspirin 81 MG tablet  Take 81 mg by mouth 2 (two) times daily.     Biotin 10 MG Caps  Take 10 mg by mouth daily.     CALCIUM + D PO  Take 1 tablet by mouth daily.     CITRUCEL PO  Take 2 tablets by mouth 2 (two) times daily.     fexofenadine-pseudoephedrine 180-240 MG per 24 hr tablet  Commonly known as:  ALLEGRA-D 24  Take 1 tablet by mouth daily.     FISH OIL PO  Take 900 mg by mouth daily. Taking 900 daily     glucosamine-chondroitin 500-400 MG tablet  Take 1 tablet by mouth 2 (two) times daily.     loratadine 10 MG tablet  Commonly known as:  CLARITIN  Take 10 mg by mouth daily as needed for allergies. As needed     methocarbamol 750 MG tablet  Commonly known as:  ROBAXIN  Take 1 tablet (750 mg total) by mouth 4 (four) times daily as needed (use for muscle cramps/pain).     MULTIVITAMIN PO  Take 1 tablet by mouth daily.     oxyCODONE 5 MG immediate release tablet  Commonly known as:  Oxy IR/ROXICODONE  Take 1-2 tablets (5-10 mg total) by mouth every 4 (four) hours as needed for moderate pain, severe pain or breakthrough pain.     Red Yeast Rice 600 MG Caps   Take 600 mg by mouth 2 (two) times daily.     SALINE NASAL SPRAY NA  Place 1 spray into the nose daily as needed (allergies).     SYNTHROID 137 MCG tablet  Generic drug:  levothyroxine  Take 1 tablet (137 mcg total) by mouth daily before breakfast.     vitamin B-12 1000 MCG tablet  Commonly known as:  CYANOCOBALAMIN  Take 1,000 mcg by mouth daily.     vitamin C 1000 MG tablet  Take 1,000 mg by mouth daily.     Vitamin D 2000 UNITS Caps  Take 2,000 Units by mouth daily.           Follow-up Information   Follow up with Breyana Follansbee C., MD In 3 weeks. (To follow up after your operation, To follow up after your hospital stay)    Specialty:  General Surgery   Contact information:   [address] [place] [address] [phone]       Signed: Adin Hector. 10/17/2013, 7:31 AM

## 2013-10-25 ENCOUNTER — Other Ambulatory Visit (INDEPENDENT_AMBULATORY_CARE_PROVIDER_SITE_OTHER): Payer: Self-pay

## 2013-10-25 ENCOUNTER — Telehealth (INDEPENDENT_AMBULATORY_CARE_PROVIDER_SITE_OTHER): Payer: Self-pay

## 2013-10-25 NOTE — Telephone Encounter (Signed)
OK to remove the low-output drain today/tomorrow - nurse only visit.  Leave the other one in if > 58mL/day

## 2013-10-25 NOTE — Telephone Encounter (Signed)
Contacted pt to set up appt time and date to have drain removed

## 2013-10-25 NOTE — Telephone Encounter (Signed)
Pt called stating that one of his drains has been draining less than 30 for 3 consecutive days. Will get a order from Dr Johney Maine to remove the drain. Informed pt that we will contact him as soon as we receive a response. Pt verbalized understanding

## 2013-11-30 ENCOUNTER — Ambulatory Visit (INDEPENDENT_AMBULATORY_CARE_PROVIDER_SITE_OTHER): Payer: Commercial Managed Care - HMO | Admitting: Emergency Medicine

## 2013-11-30 ENCOUNTER — Inpatient Hospital Stay (HOSPITAL_COMMUNITY)
Admission: EM | Admit: 2013-11-30 | Discharge: 2013-12-06 | DRG: 863 | Disposition: A | Discharge status: Home / Self Care | Payer: Medicare PPO | Attending: Surgery | Admitting: Surgery

## 2013-11-30 ENCOUNTER — Ambulatory Visit (HOSPITAL_COMMUNITY): Payer: Medicare PPO

## 2013-11-30 ENCOUNTER — Telehealth (INDEPENDENT_AMBULATORY_CARE_PROVIDER_SITE_OTHER): Payer: Self-pay

## 2013-11-30 ENCOUNTER — Emergency Department (HOSPITAL_COMMUNITY): Payer: Medicare PPO

## 2013-11-30 ENCOUNTER — Encounter (HOSPITAL_COMMUNITY): Payer: Self-pay | Admitting: Emergency Medicine

## 2013-11-30 VITALS — BP 166/80 | HR 104 | Temp 99.0°F | Resp 20 | Ht 70.0 in | Wt 182.8 lb

## 2013-11-30 DIAGNOSIS — B9561 Methicillin susceptible Staphylococcus aureus infection as the cause of diseases classified elsewhere: Secondary | ICD-10-CM | POA: Diagnosis present

## 2013-11-30 DIAGNOSIS — E860 Dehydration: Secondary | ICD-10-CM

## 2013-11-30 DIAGNOSIS — N400 Enlarged prostate without lower urinary tract symptoms: Secondary | ICD-10-CM | POA: Diagnosis present

## 2013-11-30 DIAGNOSIS — K5900 Constipation, unspecified: Secondary | ICD-10-CM | POA: Diagnosis present

## 2013-11-30 DIAGNOSIS — E785 Hyperlipidemia, unspecified: Secondary | ICD-10-CM | POA: Diagnosis present

## 2013-11-30 DIAGNOSIS — R109 Unspecified abdominal pain: Secondary | ICD-10-CM

## 2013-11-30 DIAGNOSIS — E039 Hypothyroidism, unspecified: Secondary | ICD-10-CM | POA: Diagnosis present

## 2013-11-30 DIAGNOSIS — K432 Incisional hernia without obstruction or gangrene: Secondary | ICD-10-CM | POA: Diagnosis present

## 2013-11-30 DIAGNOSIS — I341 Nonrheumatic mitral (valve) prolapse: Secondary | ICD-10-CM | POA: Diagnosis present

## 2013-11-30 DIAGNOSIS — T792XXA Traumatic secondary and recurrent hemorrhage and seroma, initial encounter: Secondary | ICD-10-CM

## 2013-11-30 DIAGNOSIS — T814XXA Infection following a procedure, initial encounter: Principal | ICD-10-CM | POA: Diagnosis present

## 2013-11-30 DIAGNOSIS — R188 Other ascites: Secondary | ICD-10-CM | POA: Diagnosis present

## 2013-11-30 DIAGNOSIS — IMO0001 Reserved for inherently not codable concepts without codable children: Secondary | ICD-10-CM

## 2013-11-30 DIAGNOSIS — J449 Chronic obstructive pulmonary disease, unspecified: Secondary | ICD-10-CM | POA: Diagnosis present

## 2013-11-30 DIAGNOSIS — R1084 Generalized abdominal pain: Secondary | ICD-10-CM

## 2013-11-30 DIAGNOSIS — L02211 Cutaneous abscess of abdominal wall: Secondary | ICD-10-CM | POA: Diagnosis present

## 2013-11-30 DIAGNOSIS — L0390 Cellulitis, unspecified: Secondary | ICD-10-CM | POA: Diagnosis present

## 2013-11-30 DIAGNOSIS — Z87891 Personal history of nicotine dependence: Secondary | ICD-10-CM

## 2013-11-30 DIAGNOSIS — Z7982 Long term (current) use of aspirin: Secondary | ICD-10-CM | POA: Diagnosis not present

## 2013-11-30 DIAGNOSIS — IMO0002 Reserved for concepts with insufficient information to code with codable children: Secondary | ICD-10-CM | POA: Diagnosis present

## 2013-11-30 DIAGNOSIS — S301XXA Contusion of abdominal wall, initial encounter: Secondary | ICD-10-CM

## 2013-11-30 LAB — POCT UA - MICROSCOPIC ONLY
CRYSTALS, UR, HPF, POC: NEGATIVE
MUCUS UA: NEGATIVE
Yeast, UA: NEGATIVE

## 2013-11-30 LAB — URINALYSIS, ROUTINE W REFLEX MICROSCOPIC
Bilirubin Urine: NEGATIVE
Glucose, UA: NEGATIVE mg/dL
Ketones, ur: 15 mg/dL — AB
LEUKOCYTES UA: NEGATIVE
Nitrite: NEGATIVE
PROTEIN: 30 mg/dL — AB
Specific Gravity, Urine: 1.03 (ref 1.005–1.030)
Urobilinogen, UA: 0.2 mg/dL (ref 0.0–1.0)
pH: 5 (ref 5.0–8.0)

## 2013-11-30 LAB — CBC WITH DIFFERENTIAL/PLATELET
BASOS ABS: 0 10*3/uL (ref 0.0–0.1)
BASOS PCT: 0 % (ref 0–1)
Eosinophils Absolute: 0.1 10*3/uL (ref 0.0–0.7)
Eosinophils Relative: 1 % (ref 0–5)
HEMATOCRIT: 32.7 % — AB (ref 39.0–52.0)
Hemoglobin: 10.8 g/dL — ABNORMAL LOW (ref 13.0–17.0)
LYMPHS PCT: 7 % — AB (ref 12–46)
Lymphs Abs: 1 10*3/uL (ref 0.7–4.0)
MCH: 28.9 pg (ref 26.0–34.0)
MCHC: 33 g/dL (ref 30.0–36.0)
MCV: 87.4 fL (ref 78.0–100.0)
MONO ABS: 1.3 10*3/uL — AB (ref 0.1–1.0)
Monocytes Relative: 9 % (ref 3–12)
NEUTROS ABS: 12.1 10*3/uL — AB (ref 1.7–7.7)
NEUTROS PCT: 83 % — AB (ref 43–77)
PLATELETS: 377 10*3/uL (ref 150–400)
RBC: 3.74 MIL/uL — ABNORMAL LOW (ref 4.22–5.81)
RDW: 13.9 % (ref 11.5–15.5)
WBC: 14.5 10*3/uL — AB (ref 4.0–10.5)

## 2013-11-30 LAB — POCT CBC
Granulocyte percent: 83.3 %G — AB (ref 37–80)
HEMATOCRIT: 34.4 % — AB (ref 43.5–53.7)
Hemoglobin: 11 g/dL — AB (ref 14.1–18.1)
Lymph, poc: 2.1 (ref 0.6–3.4)
MCH, POC: 28.8 pg (ref 27–31.2)
MCHC: 31.9 g/dL (ref 31.8–35.4)
MCV: 90.2 fL (ref 80–97)
MID (cbc): 0.5 (ref 0–0.9)
MPV: 6.2 fL (ref 0–99.8)
POC Granulocyte: 13.2 — AB (ref 2–6.9)
POC LYMPH %: 13.5 % (ref 10–50)
POC MID %: 3.2 %M (ref 0–12)
Platelet Count, POC: 434 10*3/uL — AB (ref 142–424)
RBC: 3.81 M/uL — AB (ref 4.69–6.13)
RDW, POC: 14.7 %
WBC: 15.8 10*3/uL — AB (ref 4.6–10.2)

## 2013-11-30 LAB — COMPREHENSIVE METABOLIC PANEL
ALK PHOS: 101 U/L (ref 39–117)
ALT: 18 U/L (ref 0–53)
ANION GAP: 15 (ref 5–15)
AST: 24 U/L (ref 0–37)
Albumin: 2.9 g/dL — ABNORMAL LOW (ref 3.5–5.2)
BUN: 15 mg/dL (ref 6–23)
CHLORIDE: 95 meq/L — AB (ref 96–112)
CO2: 24 mEq/L (ref 19–32)
Calcium: 9.3 mg/dL (ref 8.4–10.5)
Creatinine, Ser: 0.83 mg/dL (ref 0.50–1.35)
GFR calc non Af Amer: 85 mL/min — ABNORMAL LOW (ref >90)
Glucose, Bld: 116 mg/dL — ABNORMAL HIGH (ref 70–99)
Potassium: 4.1 mEq/L (ref 3.7–5.3)
SODIUM: 134 meq/L — AB (ref 137–147)
Total Bilirubin: 0.3 mg/dL (ref 0.3–1.2)
Total Protein: 8.7 g/dL — ABNORMAL HIGH (ref 6.0–8.3)

## 2013-11-30 LAB — POCT URINALYSIS DIPSTICK
Bilirubin, UA: NEGATIVE
Glucose, UA: NEGATIVE
KETONES UA: NEGATIVE
Leukocytes, UA: NEGATIVE
Nitrite, UA: NEGATIVE
PH UA: 5
Protein, UA: 30
Urobilinogen, UA: 0.2

## 2013-11-30 LAB — URINE MICROSCOPIC-ADD ON

## 2013-11-30 LAB — I-STAT CG4 LACTIC ACID, ED: Lactic Acid, Venous: 1.14 mmol/L (ref 0.5–2.2)

## 2013-11-30 LAB — LIPASE, BLOOD: Lipase: 17 U/L (ref 11–59)

## 2013-11-30 MED ORDER — ACETAMINOPHEN 500 MG PO TABS
1000.0000 mg | ORAL_TABLET | Freq: Once | ORAL | Status: AC
  Administered 2013-11-30: 1000 mg via ORAL
  Filled 2013-11-30: qty 2

## 2013-11-30 MED ORDER — ONDANSETRON HCL 4 MG/2ML IJ SOLN
4.0000 mg | Freq: Once | INTRAMUSCULAR | Status: AC
  Administered 2013-11-30: 4 mg via INTRAVENOUS
  Filled 2013-11-30: qty 2

## 2013-11-30 MED ORDER — IOHEXOL 300 MG/ML  SOLN
100.0000 mL | Freq: Once | INTRAMUSCULAR | Status: AC | PRN
  Administered 2013-11-30: 100 mL via INTRAVENOUS

## 2013-11-30 MED ORDER — MORPHINE SULFATE 4 MG/ML IJ SOLN
4.0000 mg | Freq: Once | INTRAMUSCULAR | Status: AC
  Administered 2013-11-30: 4 mg via INTRAVENOUS
  Filled 2013-11-30: qty 1

## 2013-11-30 NOTE — Addendum Note (Signed)
Addended by: Kem Boroughs D on: 11/30/2013 06:35 PM   Modules accepted: Orders

## 2013-11-30 NOTE — Telephone Encounter (Signed)
Pts wife called stating pt started having temp at night up to 102.5. The temps resolve in am with tylenol. Pt took nap this afternoon and when he got up his fever had returned. No swelling or redness at hernia site. Voiding well. No upper respiratory symptoms. No nausea or vomiting. She is advised her husband needs to go to her PCPs after hours clinic to be checked and r/o UTI or URI that may be brewing. I advised her if there is any swelling or redness at or around the hernia repair site pt should go to ER and r/o infection. She states she understands and will go to urgent care tonight to rule out UTI. If temp does not resolve with urg care eval pt is to call our office back. She states she understands.

## 2013-11-30 NOTE — ED Notes (Signed)
Pt placed on 2L 02, pt's 02 sats reading 88% on RA. 02 sats reading 94% on 2L 02.

## 2013-11-30 NOTE — ED Notes (Signed)
Barry Dienes, MD at bedside.

## 2013-11-30 NOTE — Progress Notes (Signed)
[facility] [address], [place] [address] [phone]  Date:  11/30/2013   Name:  Preston Reeves   DOB:  10/31/1940   MRN:  [id number]  PCP:  Jenny Reichmann, MD    Chief Complaint: Fever and Abdominal Pain   History of Present Illness:  Preston Reeves is a 73 y.o. very pleasant male patient who presents with the following:  Underwent an open ventral herniorrhaphy on 9/18 that was rather complicated.   He has been on narcotics to the point of constipation and recently they were resumed Since surgery, he has had poor appetite and is not eating.  Has a fever to 102.5 early this am.   No nausea or vomiting.  No stool change.  Passing gas. No cough or coryza No dysuria, urgency or frequency No improvement with over the counter medications or other home remedies.  Denies other complaint or health concern today.    Patient Active Problem List   Diagnosis Date Noted  . Ventral hernia 10/16/2013  . Incisional hernias s/p lap/open VWH repair w mesh 06/20/2013  . Other postablative hypothyroidism 12/28/2012  . Dyspnea on exertion 06/16/2010  . MVP (mitral valve prolapse)   . Diverticulosis   . History of BPH   . Hemochromatosis   . COPD (chronic obstructive pulmonary disease)     Past Medical History  Diagnosis Date  . MVP (mitral valve prolapse)   . Diverticulosis     hx of  . History of BPH   . Hemochromatosis     hx of  . History of COPD   . Thyroid disease   . Incisional hernia   . Hyperlipidemia   . Diverticulitis of colon with perforation 2010    Colectomy/ostomy  . Colon polyps     Adenomatous  . Allergy     SEASONAL  . Cataract   . Hypothyroidism     Past Surgical History  Procedure Laterality Date  . Inguinal hernia repair Left 1999    Dr. Rebekah Chesterfield  . Partial colectomy  08/12/2008    Jeanette Caprice  . Colostomy takedown  2010    Dr Barry Dienes  . Colonoscopy w/ biopsies  2011    Dr Henrene Pastor  . Colonoscopy    . Upper gi endoscopy    .  Ventral hernia repair N/A 10/13/2013    Procedure: REPAIR OF VENTRAL WALL HERNIA WITH UNILATERAL COMPARTMENT SEPARATION;  Surgeon: Michael Boston, MD;  Location: [facility] [place];  Service: General;  Laterality: N/A;  . Insertion of mesh N/A 10/13/2013    Procedure: INSERTION OF MESH;  Surgeon: Michael Boston, MD;  Location: [facility];  Service: General;  Laterality: N/A;  . Laparoscopic lysis of adhesions N/A 10/13/2013    Procedure: LAPAROSCOPIC LYSIS OF ADHESIONS;  Surgeon: Michael Boston, MD;  Location: [facility];  Service: General;  Laterality: N/A;    History  Substance Use Topics  . Smoking status: Former Smoker    Types: Cigarettes  . Smokeless tobacco: Never Used  . Alcohol Use: Yes     Comment: drinks beer weekly    Family History  Problem Relation Age of Onset  . Breast cancer Mother   . Colon cancer Father   . Heart disease Father   . Diabetes Father     No Known Allergies  Medication list has been reviewed and updated.  Current Outpatient Prescriptions on File Prior to Visit  Medication Sig Dispense Refill  . Ascorbic Acid (VITAMIN C) 1000  MG tablet Take 1,000 mg by mouth daily.      Marland Kitchen aspirin 81 MG tablet Take 81 mg by mouth 2 (two) times daily.     . Biotin 10 MG CAPS Take 10 mg by mouth daily.     . Calcium Carbonate-Vitamin D (CALCIUM + D PO) Take 1 tablet by mouth daily.     . Cholecalciferol (VITAMIN D) 2000 UNITS CAPS Take 2,000 Units by mouth daily.     Marland Kitchen loratadine (CLARITIN) 10 MG tablet Take 10 mg by mouth daily as needed for allergies. As needed    . methocarbamol (ROBAXIN) 750 MG tablet Take 1 tablet (750 mg total) by mouth 4 (four) times daily as needed (use for muscle cramps/pain). 30 tablet 2  . Methylcellulose, Laxative, (CITRUCEL PO) Take 2 tablets by mouth 2 (two) times daily.     . Multiple Vitamins-Minerals (MULTIVITAMIN PO) Take 1 tablet by mouth daily.     . Omega-3 Fatty Acids (FISH OIL PO) Take 900 mg by mouth daily. Taking 900 daily    . oxyCODONE (OXY  IR/ROXICODONE) 5 MG immediate release tablet Take 1-2 tablets (5-10 mg total) by mouth every 4 (four) hours as needed for moderate pain, severe pain or breakthrough pain. 50 tablet 0  . Red Yeast Rice 600 MG CAPS Take 600 mg by mouth 2 (two) times daily.    Marland Kitchen SALINE NASAL SPRAY NA Place 1 spray into the nose daily as needed (allergies).     . SYNTHROID 137 MCG tablet Take 1 tablet (137 mcg total) by mouth daily before breakfast. 90 tablet 3  . vitamin B-12 (CYANOCOBALAMIN) 1000 MCG tablet Take 1,000 mcg by mouth daily.    . fexofenadine-pseudoephedrine (ALLEGRA-D 24) 180-240 MG per 24 hr tablet Take 1 tablet by mouth daily.     No current facility-administered medications on file prior to visit.    Review of Systems:  As per HPI, otherwise negative.    Physical Examination: Filed Vitals:   11/30/13 1724  BP: 166/80  Pulse: 104  Temp: 99 F (37.2 C)  Resp: 20   Filed Vitals:   11/30/13 1724  Height: 5\' 10"  (1.778 m)  Weight: 182 lb 12.8 oz (82.918 kg)   Body mass index is 26.23 kg/(m^2). Ideal Body Weight: Weight in (lb) to have BMI = 25: 173.9  GEN: WDWN, moderate distress, Non-toxic, A & O x 3 HEENT: Atraumatic, Normocephalic. Neck supple. No masses, No LAD. Ears and Nose: No external deformity. CV: RRR, No M/G/R. No JVD. No thrill. No extra heart sounds. PULM: CTA B, no wheezes, crackles, rhonchi. No retractions. No resp. distress. No accessory muscle use. ABD: S, generally tender worse in LLQ where he has a mass that is erythematous, ND, +BS. No rebound. No HSM. EXTR: No c/c/e NEURO Normal gait.  PSYCH: Normally interactive. Conversant. Not depressed or anxious appearing.  Calm demeanor.    Assessment and Plan: Abdominal pain; ?abscess, PSBO, constipation Dehydration  Labs ER    Signed,  Ellison Carwin, MD   Results for orders placed or performed in visit on 11/30/13  POCT CBC  Result Value Ref Range   WBC 15.8 (A) 4.6 - 10.2 K/uL   Lymph, poc 2.1 0.6 -  3.4   POC LYMPH PERCENT 13.5 10 - 50 %L   MID (cbc) 0.5 0 - 0.9   POC MID % 3.2 0 - 12 %M   POC Granulocyte 13.2 (A) 2 - 6.9   Granulocyte percent 83.3 (A) 37 -  80 %G   RBC 3.81 (A) 4.69 - 6.13 M/uL   Hemoglobin 11.0 (A) 14.1 - 18.1 g/dL   HCT, POC 34.4 (A) 43.5 - 53.7 %   MCV 90.2 80 - 97 fL   MCH, POC 28.8 27 - 31.2 pg   MCHC 31.9 31.8 - 35.4 g/dL   RDW, POC 14.7 %   Platelet Count, POC 434 (A) 142 - 424 K/uL   MPV 6.2 0 - 99.8 fL  POCT urinalysis dipstick  Result Value Ref Range   Color, UA yellow    Clarity, UA hazy    Glucose, UA neg    Bilirubin, UA neg    Ketones, UA neg    Spec Grav, UA >=1.030    Blood, UA small    pH, UA 5.0    Protein, UA 30    Urobilinogen, UA 0.2    Nitrite, UA neg    Leukocytes, UA Negative   POCT UA - Microscopic Only  Result Value Ref Range   WBC, Ur, HPF, POC 0-2    RBC, urine, microscopic 5-8    Bacteria, U Microscopic trace    Mucus, UA neg    Epithelial cells, urine per micros 1-3    Crystals, Ur, HPF, POC neg    Casts, Ur, LPF, POC hyaline    Yeast, UA neg

## 2013-11-30 NOTE — ED Provider Notes (Signed)
CSN: [id number]     Arrival date & time 11/30/13  1903 History   First MD Initiated Contact with Patient 11/30/13 1930     Chief Complaint  Patient presents with  . Abdominal Pain     (Consider location/radiation/quality/duration/timing/severity/associated sxs/prior Treatment) Patient is a 73 y.o. male presenting with abdominal pain. The history is provided by the patient. No language interpreter was used.  Abdominal Pain Pain location:  LLQ Pain quality: aching   Pain radiates to:  Does not radiate Pain severity:  Moderate Onset quality:  Gradual Duration:  3 days Timing:  Constant Progression:  Worsening Chronicity:  New Context: previous surgery   Relieved by:  Nothing Worsened by:  Movement and palpation Ineffective treatments:  None tried Associated symptoms: anorexia and fever   Associated symptoms: no constipation, no diarrhea, no dysuria, no hematemesis, no hematochezia, no melena, no nausea and no vomiting   Risk factors comment:  Hernia repair in september   Past Medical History  Diagnosis Date  . MVP (mitral valve prolapse)   . Diverticulosis     hx of  . History of BPH   . Hemochromatosis     hx of  . History of COPD   . Thyroid disease   . Incisional hernia   . Hyperlipidemia   . Diverticulitis of colon with perforation 2010    Colectomy/ostomy  . Colon polyps     Adenomatous  . Allergy     SEASONAL  . Cataract   . Hypothyroidism    Past Surgical History  Procedure Laterality Date  . Inguinal hernia repair Left 1999    Dr. Rebekah Chesterfield  . Partial colectomy  08/12/2008    Jeanette Caprice  . Colostomy takedown  2010    Dr Barry Dienes  . Colonoscopy w/ biopsies  2011    Dr Henrene Pastor  . Colonoscopy    . Upper gi endoscopy    . Ventral hernia repair N/A 10/13/2013    Procedure: REPAIR OF VENTRAL WALL HERNIA WITH UNILATERAL COMPARTMENT SEPARATION;  Surgeon: Michael Boston, MD;  Location: [facility];  Service: General;  Laterality: N/A;  . Insertion of mesh N/A 10/13/2013   Procedure: INSERTION OF MESH;  Surgeon: Michael Boston, MD;  Location: [facility];  Service: General;  Laterality: N/A;  . Laparoscopic lysis of adhesions N/A 10/13/2013    Procedure: LAPAROSCOPIC LYSIS OF ADHESIONS;  Surgeon: Michael Boston, MD;  Location: [facility] OR;  Service: General;  Laterality: N/A;   Family History  Problem Relation Age of Onset  . Breast cancer Mother   . Colon cancer Father   . Heart disease Father   . Diabetes Father    History  Substance Use Topics  . Smoking status: Former Smoker    Types: Cigarettes  . Smokeless tobacco: Never Used  . Alcohol Use: Yes     Comment: drinks beer weekly    Review of Systems  Constitutional: Positive for fever.  Gastrointestinal: Positive for abdominal pain and anorexia. Negative for nausea, vomiting, diarrhea, constipation, melena, hematochezia and hematemesis.  Genitourinary: Negative for dysuria.      Allergies  Review of patient's allergies indicates no known allergies.  Home Medications   Prior to Admission medications   Medication Sig Start Date End Date Taking? Authorizing Provider  Ascorbic Acid (VITAMIN C) 1000 MG tablet Take 1,000 mg by mouth daily.      Historical Provider, MD  aspirin 81 MG tablet Take 81 mg by mouth 2 (two) times daily.     Historical  Provider, MD  Biotin 10 MG CAPS Take 10 mg by mouth daily.     Historical Provider, MD  Calcium Carbonate-Vitamin D (CALCIUM + D PO) Take 1 tablet by mouth daily.     Historical Provider, MD  Cholecalciferol (VITAMIN D) 2000 UNITS CAPS Take 2,000 Units by mouth daily.     Historical Provider, MD  fexofenadine-pseudoephedrine (ALLEGRA-D 24) 180-240 MG per 24 hr tablet Take 1 tablet by mouth daily.    Historical Provider, MD  Glucosamine 500 MG CAPS Take by mouth.    Historical Provider, MD  loratadine (CLARITIN) 10 MG tablet Take 10 mg by mouth daily as needed for allergies. As needed    Historical Provider, MD  methocarbamol (ROBAXIN) 750 MG tablet Take 1 tablet (750 mg  total) by mouth 4 (four) times daily as needed (use for muscle cramps/pain). 10/13/13   Michael Boston, MD  Methylcellulose, Laxative, (CITRUCEL PO) Take 2 tablets by mouth 2 (two) times daily.     Historical Provider, MD  Multiple Vitamins-Minerals (MULTIVITAMIN PO) Take 1 tablet by mouth daily.     Historical Provider, MD  Omega-3 Fatty Acids (FISH OIL PO) Take 900 mg by mouth daily. Taking 900 daily    Historical Provider, MD  oxyCODONE (OXY IR/ROXICODONE) 5 MG immediate release tablet Take 1-2 tablets (5-10 mg total) by mouth every 4 (four) hours as needed for moderate pain, severe pain or breakthrough pain. 10/13/13   Michael Boston, MD  Red Yeast Rice 600 MG CAPS Take 600 mg by mouth 2 (two) times daily.    Historical Provider, MD  SALINE NASAL SPRAY NA Place 1 spray into the nose daily as needed (allergies).     Historical Provider, MD  SYNTHROID 137 MCG tablet Take 1 tablet (137 mcg total) by mouth daily before breakfast. 12/28/12   Elayne Snare, MD  vitamin B-12 (CYANOCOBALAMIN) 1000 MCG tablet Take 1,000 mcg by mouth daily.    Historical Provider, MD   BP 158/67 mmHg  Pulse 117  Temp(Src) 98.2 F (36.8 C) (Oral)  Resp 20  Ht 5\' 11"  (1.803 m)  Wt 184 lb (83.462 kg)  BMI 25.67 kg/m2  SpO2 95% Physical Exam  Constitutional: He appears well-developed and well-nourished. No distress.  HENT:  Mouth/Throat: Oropharynx is clear and moist.  Cardiovascular: Normal rate and regular rhythm.   Pulmonary/Chest: Effort normal and breath sounds normal.  Abdominal: Soft. Bowel sounds are normal. He exhibits no distension. There is tenderness in the left lower quadrant. There is no rigidity and no guarding. No hernia.    Vertical midline incision and small 2 inch incision in LLQ intact, no drainage or erythema.  Vitals reviewed.   ED Course  Procedures (including critical care time) Labs Review Labs Reviewed  CBC WITH DIFFERENTIAL - Abnormal; Notable for the following:    WBC 14.5 (*)    RBC  3.74 (*)    Hemoglobin 10.8 (*)    HCT 32.7 (*)    Neutrophils Relative % 83 (*)    Neutro Abs 12.1 (*)    Lymphocytes Relative 7 (*)    Monocytes Absolute 1.3 (*)    All other components within normal limits  COMPREHENSIVE METABOLIC PANEL - Abnormal; Notable for the following:    Sodium 134 (*)    Chloride 95 (*)    Glucose, Bld 116 (*)    Total Protein 8.7 (*)    Albumin 2.9 (*)    GFR calc non Af Amer 85 (*)    All  other components within normal limits  URINALYSIS, ROUTINE W REFLEX MICROSCOPIC - Abnormal; Notable for the following:    APPearance CLOUDY (*)    Hgb urine dipstick TRACE (*)    Ketones, ur 15 (*)    Protein, ur 30 (*)    All other components within normal limits  URINE MICROSCOPIC-ADD ON - Abnormal; Notable for the following:    Crystals CA OXALATE CRYSTALS (*)    All other components within normal limits  LIPASE, BLOOD  I-STAT CG4 LACTIC ACID, ED    Imaging Review Ct Abdomen Pelvis W Contrast  11/30/2013   CLINICAL DATA:  Generalized abdominal pain and leukocytosis. Symptoms have been present since hernia repair September 2015.  EXAM: CT ABDOMEN AND PELVIS WITH CONTRAST  TECHNIQUE: Multidetector CT imaging of the abdomen and pelvis was performed using the standard protocol following bolus administration of intravenous contrast.  CONTRAST:  161mL OMNIPAQUE IOHEXOL 300 MG/ML  SOLN  COMPARISON:  08/22/2008  FINDINGS: BODY WALL: There is a ventral abdominal wall hernia repair using mesh. The upper margin of the mesh is difficult to discern, but it is at least contiguous with an irregularly shaped fluid collection in the anterior peritoneal or extraperitoneal intra-abdominal space. This peripherally enhancing collection measures at 9 cm in craniocaudal span by up to 14 cm in transverse span by 1.3 cm in maximal thickness. The collection decompresses into the subcutaneous space through defects in the bilateral anterior abdominal wall. The largest pocket of the subcutaneous  fluid collection measures approximately 7 cm in diameter by 3 cm in thickness. No subcutaneous gas or evidence of fistula.  LOWER CHEST: Unremarkable.  ABDOMEN/PELVIS:  Liver: No focal abnormality.  Biliary: Cholelithiasis. No evidence for biliary obstruction or perforation.  Pancreas: Unremarkable.  Spleen: Unremarkable.  Adrenals: Unremarkable.  Kidneys and ureters: No hydronephrosis or stone. 2 cm exophytic cyst from the left lower pole.  Bladder: Unremarkable.  Reproductive: Unremarkable.  Bowel: No obstruction. Negative appendix. Status post sigmoidectomy.  Retroperitoneum: No mass or adenopathy.  Peritoneum: No ascites or pneumoperitoneum.  Vascular: No acute abnormality.  OSSEOUS: Bilateral L5 pars defects with grade 1-2 anterolisthesis.  IMPRESSION: Extensive fluid collection, likely abscess, both superficial and deep to the anterior abdominal wall. The collection is contiguous with mesh used for hernia repair.   Electronically Signed   By: Jorje Guild M.D.   On: 11/30/2013 21:31     EKG Interpretation None      MDM   Final diagnoses:  Abdominal pain   73 y/o male s/p ventral abdominal hernia repair with mesh 9/18 presenting with LLQ redness and swelling x 3 days. Fever to 102 at home. CT showing large abscess. Surgery consulted and will admit for abx and drainage.     Amparo Bristol, MD 12/01/13 [phone]

## 2013-11-30 NOTE — H&P (Signed)
Preston Reeves is an 73 y.o. male.   Chief Complaint: abdominal pain and fevers. HPI:  Pt is a 73 yo M who is around 6-7 weeks s/p ventral hernia repair who presents with fevers, left abdominal pain, skin changes, and abdominal distention.  He is s/p Hartmann's procedure for perforated sigmoid diverticulitis in 2012.  He had an ostomy takedown a few months later.  He saw Dr. Johney Maine this summer and underwent elective incisional hernia repair with component separation and mesh 10/13/2013.  It was a difficult operation and had 90 min of lysis of adnesions.  Total procedure time was around 5 hours.  He was doing well until around 2 weeks ago.  He started having more left abdominal pain.  He attributed this to constipation, and he took prunes and prune juice.  He was successful in having multiple BMs, but he continued to feel left sided pain.  He saw Dr. Johney Maine last week, and was having these vague symptoms.  Over the last week, he has been having low grade temps.  However, he started having higher temps yesterday and today.  He went to urgent care and had U/A done, which was negative.  As of this morning, he had no skin changes, but this evening when they took off the abdominal binder, he had redness across his lower abdomen, and increased swelling/bloating than previously.  He came to the ED for evaluation.    Past Medical History  Diagnosis Date  . MVP (mitral valve prolapse)   . Diverticulosis     hx of  . History of BPH   . Hemochromatosis     hx of  . History of COPD   . Thyroid disease   . Incisional hernia   . Hyperlipidemia   . Diverticulitis of colon with perforation 2010    Colectomy/ostomy  . Colon polyps     Adenomatous  . Allergy     SEASONAL  . Cataract   . Hypothyroidism     Past Surgical History  Procedure Laterality Date  . Inguinal hernia repair Left 1999    Dr. Rebekah Chesterfield  . Partial colectomy  08/12/2008    Jeanette Caprice  . Colostomy takedown  2010    Dr Barry Dienes  . Colonoscopy w/  biopsies  2011    Dr Henrene Pastor  . Colonoscopy    . Upper gi endoscopy    . Ventral hernia repair N/A 10/13/2013    Procedure: REPAIR OF VENTRAL WALL HERNIA WITH UNILATERAL COMPARTMENT SEPARATION;  Surgeon: Michael Boston, MD;  Location: [facility];  Service: General;  Laterality: N/A;  . Insertion of mesh N/A 10/13/2013    Procedure: INSERTION OF MESH;  Surgeon: Michael Boston, MD;  Location: [facility];  Service: General;  Laterality: N/A;  . Laparoscopic lysis of adhesions N/A 10/13/2013    Procedure: LAPAROSCOPIC LYSIS OF ADHESIONS;  Surgeon: Michael Boston, MD;  Location: [facility] OR;  Service: General;  Laterality: N/A;    Family History  Problem Relation Age of Onset  . Breast cancer Mother   . Colon cancer Father   . Heart disease Father   . Diabetes Father    Social History:  reports that he has quit smoking. His smoking use included Cigarettes. He smoked 0.00 packs per day. He has never used smokeless tobacco. He reports that he drinks alcohol. He reports that he does not use illicit drugs.  Allergies: No Known Allergies  Medications Outpatient Medications (18) Hospital Medications (0) Clinic-Administered Medications (0)   acetaminophen (TYLENOL)  500 MG tablet   Ascorbic Acid (VITAMIN C) 1000 MG tablet   aspirin 81 MG tablet   Biotin 10 MG CAPS   Calcium Carbonate-Vitamin D (CALCIUM + D PO)   Cholecalciferol (VITAMIN D) 2000 UNITS CAPS   fexofenadine-pseudoephedrine (ALLEGRA-D 24) 180-240 MG per 24 hr tablet   Glucosamine 500 MG CAPS   methocarbamol (ROBAXIN) 750 MG tablet   Methylcellulose, Laxative, (CITRUCEL PO)   Multiple Vitamins-Minerals (MULTIVITAMIN PO)   Omega-3 Fatty Acids (FISH OIL PO)   oxyCODONE (OXY IR/ROXICODONE) 5 MG immediate release tablet   Red Yeast Rice 600 MG CAPS   SALINE NASAL SPRAY NA   SYNTHROID 137 MCG tablet   vitamin B-12 (CYANOCOBALAMIN) 1000 MCG tablet   loratadine (CLARITIN) 10 MG tablet     Results for orders placed or performed during the  hospital encounter of 11/30/13 (from the past 48 hour(s))  CBC with Differential     Status: Abnormal   Collection Time: 11/30/13  7:24 PM  Result Value Ref Range   WBC 14.5 (H) 4.0 - 10.5 K/uL   RBC 3.74 (L) 4.22 - 5.81 MIL/uL   Hemoglobin 10.8 (L) 13.0 - 17.0 g/dL   HCT 32.7 (L) 39.0 - 52.0 %   MCV 87.4 78.0 - 100.0 fL   MCH 28.9 26.0 - 34.0 pg   MCHC 33.0 30.0 - 36.0 g/dL   RDW 13.9 11.5 - 15.5 %   Platelets 377 150 - 400 K/uL   Neutrophils Relative % 83 (H) 43 - 77 %   Neutro Abs 12.1 (H) 1.7 - 7.7 K/uL   Lymphocytes Relative 7 (L) 12 - 46 %   Lymphs Abs 1.0 0.7 - 4.0 K/uL   Monocytes Relative 9 3 - 12 %   Monocytes Absolute 1.3 (H) 0.1 - 1.0 K/uL   Eosinophils Relative 1 0 - 5 %   Eosinophils Absolute 0.1 0.0 - 0.7 K/uL   Basophils Relative 0 0 - 1 %   Basophils Absolute 0.0 0.0 - 0.1 K/uL  Comprehensive metabolic panel     Status: Abnormal   Collection Time: 11/30/13  7:24 PM  Result Value Ref Range   Sodium 134 (L) 137 - 147 mEq/L   Potassium 4.1 3.7 - 5.3 mEq/L   Chloride 95 (L) 96 - 112 mEq/L   CO2 24 19 - 32 mEq/L   Glucose, Bld 116 (H) 70 - 99 mg/dL   BUN 15 6 - 23 mg/dL   Creatinine, Ser 0.83 0.50 - 1.35 mg/dL   Calcium 9.3 8.4 - 10.5 mg/dL   Total Protein 8.7 (H) 6.0 - 8.3 g/dL   Albumin 2.9 (L) 3.5 - 5.2 g/dL   AST 24 0 - 37 U/L   ALT 18 0 - 53 U/L   Alkaline Phosphatase 101 39 - 117 U/L   Total Bilirubin 0.3 0.3 - 1.2 mg/dL   GFR calc non Af Amer 85 (L) >90 mL/min   GFR calc Af Amer >90 >90 mL/min    Comment: (NOTE) The eGFR has been calculated using the CKD EPI equation. This calculation has not been validated in all clinical situations. eGFR's persistently <90 mL/min signify possible Chronic Kidney Disease.    Anion gap 15 5 - 15  Lipase, blood     Status: None   Collection Time: 11/30/13  7:24 PM  Result Value Ref Range   Lipase 17 11 - 59 U/L  I-Stat CG4 Lactic Acid, ED     Status: None   Collection Time:  11/30/13  8:07 PM  Result Value Ref  Range   Lactic Acid, Venous 1.14 0.5 - 2.2 mmol/L  Urinalysis, Routine w reflex microscopic     Status: Abnormal   Collection Time: 11/30/13  8:30 PM  Result Value Ref Range   Color, Urine YELLOW YELLOW   APPearance CLOUDY (A) CLEAR   Specific Gravity, Urine 1.030 1.005 - 1.030   pH 5.0 5.0 - 8.0   Glucose, UA NEGATIVE NEGATIVE mg/dL   Hgb urine dipstick TRACE (A) NEGATIVE   Bilirubin Urine NEGATIVE NEGATIVE   Ketones, ur 15 (A) NEGATIVE mg/dL   Protein, ur 30 (A) NEGATIVE mg/dL   Urobilinogen, UA 0.2 0.0 - 1.0 mg/dL   Nitrite NEGATIVE NEGATIVE   Leukocytes, UA NEGATIVE NEGATIVE  Urine microscopic-add on     Status: Abnormal   Collection Time: 11/30/13  8:30 PM  Result Value Ref Range   WBC, UA 0-2 <3 WBC/hpf   RBC / HPF 0-2 <3 RBC/hpf   Bacteria, UA RARE RARE   Crystals CA OXALATE CRYSTALS (A) NEGATIVE   Urine-Other MUCOUS PRESENT    Ct Abdomen Pelvis W Contrast  11/30/2013   CLINICAL DATA:  Generalized abdominal pain and leukocytosis. Symptoms have been present since hernia repair September 2015.  EXAM: CT ABDOMEN AND PELVIS WITH CONTRAST  TECHNIQUE: Multidetector CT imaging of the abdomen and pelvis was performed using the standard protocol following bolus administration of intravenous contrast.  CONTRAST:  180m OMNIPAQUE IOHEXOL 300 MG/ML  SOLN  COMPARISON:  08/22/2008  FINDINGS: BODY WALL: There is a ventral abdominal wall hernia repair using mesh. The upper margin of the mesh is difficult to discern, but it is at least contiguous with an irregularly shaped fluid collection in the anterior peritoneal or extraperitoneal intra-abdominal space. This peripherally enhancing collection measures at 9 cm in craniocaudal span by up to 14 cm in transverse span by 1.3 cm in maximal thickness. The collection decompresses into the subcutaneous space through defects in the bilateral anterior abdominal wall. The largest pocket of the subcutaneous fluid collection measures approximately 7 cm in  diameter by 3 cm in thickness. No subcutaneous gas or evidence of fistula.  LOWER CHEST: Unremarkable.  ABDOMEN/PELVIS:  Liver: No focal abnormality.  Biliary: Cholelithiasis. No evidence for biliary obstruction or perforation.  Pancreas: Unremarkable.  Spleen: Unremarkable.  Adrenals: Unremarkable.  Kidneys and ureters: No hydronephrosis or stone. 2 cm exophytic cyst from the left lower pole.  Bladder: Unremarkable.  Reproductive: Unremarkable.  Bowel: No obstruction. Negative appendix. Status post sigmoidectomy.  Retroperitoneum: No mass or adenopathy.  Peritoneum: No ascites or pneumoperitoneum.  Vascular: No acute abnormality.  OSSEOUS: Bilateral L5 pars defects with grade 1-2 anterolisthesis.  IMPRESSION: Extensive fluid collection, likely abscess, both superficial and deep to the anterior abdominal wall. The collection is contiguous with mesh used for hernia repair.   Electronically Signed   By: JJorje GuildM.D.   On: 11/30/2013 21:31    Review of Systems  Constitutional: Positive for fever and chills.  HENT: Negative.   Eyes: Negative.   Respiratory: Negative.   Cardiovascular: Negative.   Gastrointestinal: Positive for abdominal pain. Negative for nausea, vomiting, diarrhea and constipation.  Genitourinary: Negative.   Musculoskeletal: Negative.   Skin:       Redness on abdominal wall  Neurological: Negative.   Endo/Heme/Allergies: Negative.   Psychiatric/Behavioral: Negative.     Blood pressure 150/75, pulse 91, temperature 99.3 F (37.4 C), temperature source Oral, resp. rate 20, height '5\' 11"'  (1.803 m),  weight 184 lb (83.462 kg), SpO2 96 %. Physical Exam  Constitutional: He is oriented to person, place, and time. He appears well-developed and well-nourished. He appears distressed (looks uncomfortable).  HENT:  Head: Normocephalic and atraumatic.  Eyes: Conjunctivae are normal. Pupils are equal, round, and reactive to light. No scleral icterus.  Neck: Normal range of motion.    Cardiovascular: Normal rate, regular rhythm and intact distal pulses.   Respiratory: Effort normal. No respiratory distress.  GI: Soft. He exhibits distension. He exhibits no mass. Ascites: fullness and redness in left mid abdomen. There is tenderness (Left mid abdomen). There is no rebound and no guarding.    Swelling in Left mid abdomen  Musculoskeletal: Normal range of motion. He exhibits no edema or tenderness.  Neurological: He is alert and oriented to person, place, and time. Coordination normal.  Skin: Skin is warm and dry. No rash noted. He is not diaphoretic. There is erythema. No pallor.  Psychiatric: He has a normal mood and affect. His behavior is normal. Judgment and thought content normal.     Assessment/Plan Delayed Infected seroma s/p incisional hernia repair Plan admission to the floor. IV Fluids IV antibiotics NPO after around 1 am. Will d/w Dr. Johney Maine. Will likely either ask IR to aspirate vs I&D.     Rosalin Buster 11/30/2013, 11:39 PM

## 2013-11-30 NOTE — ED Notes (Signed)
MD informed pt has been battling a fever all day, VS updated with MD.

## 2013-11-30 NOTE — ED Notes (Signed)
Pt placed on 3L 02, 02 sats reading 89% on 2L 02.

## 2013-11-30 NOTE — ED Notes (Signed)
Pt presents with abdominal discomfort since abdominal surgery in September.  Pt has noticed a knot to left lower abdomen 3 days ago- area hot to touch and redness noted site.  Pt reports having CBC completed today at PCP with WBC of 16- pt was instructed to come to ED.  Denies N/V.  LBM 2 days ago.

## 2013-11-30 NOTE — ED Notes (Signed)
CT called and informed pt has finished his contrast.

## 2013-12-01 ENCOUNTER — Inpatient Hospital Stay (HOSPITAL_COMMUNITY): Payer: Medicare PPO

## 2013-12-01 LAB — BASIC METABOLIC PANEL
Anion gap: 13 (ref 5–15)
BUN: 12 mg/dL (ref 6–23)
CHLORIDE: 99 meq/L (ref 96–112)
CO2: 23 mEq/L (ref 19–32)
CREATININE: 0.78 mg/dL (ref 0.50–1.35)
Calcium: 8.7 mg/dL (ref 8.4–10.5)
GFR calc Af Amer: >90 mL/min (ref >90)
GFR calc non Af Amer: 87 mL/min — ABNORMAL LOW (ref >90)
GLUCOSE: 131 mg/dL — AB (ref 70–99)
Potassium: 4.1 mEq/L (ref 3.7–5.3)
Sodium: 135 mEq/L — ABNORMAL LOW (ref 137–147)

## 2013-12-01 LAB — CBC
HEMATOCRIT: 29.3 % — AB (ref 39.0–52.0)
Hemoglobin: 9.8 g/dL — ABNORMAL LOW (ref 13.0–17.0)
MCH: 29.7 pg (ref 26.0–34.0)
MCHC: 33.4 g/dL (ref 30.0–36.0)
MCV: 88.8 fL (ref 78.0–100.0)
Platelets: 321 10*3/uL (ref 150–400)
RBC: 3.3 MIL/uL — ABNORMAL LOW (ref 4.22–5.81)
RDW: 13.9 % (ref 11.5–15.5)
WBC: 13 10*3/uL — ABNORMAL HIGH (ref 4.0–10.5)

## 2013-12-01 LAB — APTT: aPTT: 38 seconds — ABNORMAL HIGH (ref 24–37)

## 2013-12-01 LAB — PROTIME-INR
INR: 1.31 (ref 0.00–1.49)
Prothrombin Time: 16.4 seconds — ABNORMAL HIGH (ref 11.6–15.2)

## 2013-12-01 MED ORDER — DOXYCYCLINE HYCLATE 100 MG PO TABS: 100.0000 mg | ORAL_TABLET | Freq: Two times a day (BID) | ORAL | Status: DC

## 2013-12-01 MED ORDER — METHOCARBAMOL 750 MG PO TABS: 750.0000 mg | ORAL_TABLET | Freq: Four times a day (QID) | ORAL | Status: DC | PRN

## 2013-12-01 MED ORDER — KCL IN DEXTROSE-NACL 20-5-0.45 MEQ/L-%-% IV SOLN
INTRAVENOUS | Status: DC
  Administered 2013-12-01: 01:00:00 via INTRAVENOUS
  Administered 2013-12-02: 100 mL/h via INTRAVENOUS
  Administered 2013-12-03: via INTRAVENOUS
  Filled 2013-12-01 (×9): qty 1000

## 2013-12-01 MED ORDER — GENTAMICIN SULFATE 40 MG/ML IJ SOLN
130.0000 mg | Freq: Three times a day (TID) | INTRAVENOUS | Status: DC
  Filled 2013-12-01: qty 3.25

## 2013-12-01 MED ORDER — FENTANYL CITRATE 0.05 MG/ML IJ SOLN
INTRAMUSCULAR | Status: AC
  Filled 2013-12-01: qty 2

## 2013-12-01 MED ORDER — LEVOTHYROXINE SODIUM 137 MCG PO TABS
137.0000 ug | ORAL_TABLET | Freq: Every day | ORAL | Status: DC
  Administered 2013-12-01 – 2013-12-06 (×6): 137 ug via ORAL
  Filled 2013-12-01 (×7): qty 1

## 2013-12-01 MED ORDER — ACETAMINOPHEN 500 MG PO TABS
1000.0000 mg | ORAL_TABLET | Freq: Three times a day (TID) | ORAL | Status: DC | PRN
  Administered 2013-12-01 – 2013-12-02 (×3): 1000 mg via ORAL
  Filled 2013-12-01 (×3): qty 2

## 2013-12-01 MED ORDER — GENTAMICIN SULFATE 40 MG/ML IJ SOLN
7.0000 mg/kg | INTRAVENOUS | Status: DC
  Administered 2013-12-02 – 2013-12-03 (×3): 550 mg via INTRAVENOUS
  Filled 2013-12-01 (×4): qty 13.75

## 2013-12-01 MED ORDER — LIDOCAINE HCL (PF) 1 % IJ SOLN
INTRAMUSCULAR | Status: AC
Start: 2013-12-01 — End: 2013-12-01
  Filled 2013-12-01: qty 10

## 2013-12-01 MED ORDER — SALINE SPRAY 0.65 % NA SOLN
1.0000 | Freq: Every day | NASAL | Status: DC | PRN
  Filled 2013-12-01: qty 44

## 2013-12-01 MED ORDER — DIPHENHYDRAMINE HCL 12.5 MG/5ML PO ELIX: 12.5000 mg | ORAL_SOLUTION | Freq: Four times a day (QID) | ORAL | Status: DC | PRN

## 2013-12-01 MED ORDER — DIPHENHYDRAMINE HCL 50 MG/ML IJ SOLN: 12.5000 mg | Freq: Four times a day (QID) | INTRAMUSCULAR | Status: DC | PRN

## 2013-12-01 MED ORDER — PIPERACILLIN-TAZOBACTAM 3.375 G IVPB
3.3750 g | Freq: Three times a day (TID) | INTRAVENOUS | Status: DC
  Administered 2013-12-01 – 2013-12-04 (×11): 3.375 g via INTRAVENOUS
  Filled 2013-12-01 (×14): qty 50

## 2013-12-01 MED ORDER — OXYCODONE HCL 5 MG PO TABS
5.0000 mg | ORAL_TABLET | ORAL | Status: DC | PRN
  Administered 2013-12-01 (×2): 10 mg via ORAL
  Administered 2013-12-01: 5 mg via ORAL
  Administered 2013-12-01 – 2013-12-02 (×2): 10 mg via ORAL
  Administered 2013-12-02 – 2013-12-05 (×12): 5 mg via ORAL
  Administered 2013-12-06: 10 mg via ORAL
  Administered 2013-12-06: 5 mg via ORAL
  Filled 2013-12-01 (×3): qty 1
  Filled 2013-12-01: qty 2
  Filled 2013-12-01 (×4): qty 1
  Filled 2013-12-01: qty 2
  Filled 2013-12-01 (×5): qty 1
  Filled 2013-12-01 (×2): qty 2
  Filled 2013-12-01 (×3): qty 1
  Filled 2013-12-01: qty 2

## 2013-12-01 MED ORDER — SENNA 8.6 MG PO TABS
1.0000 | ORAL_TABLET | Freq: Two times a day (BID) | ORAL | Status: DC
  Administered 2013-12-01 – 2013-12-03 (×7): 8.6 mg via ORAL
  Filled 2013-12-01 (×9): qty 1

## 2013-12-01 MED ORDER — GENTAMICIN SULFATE 40 MG/ML IJ SOLN
150.0000 mg | Freq: Once | INTRAVENOUS | Status: AC
  Administered 2013-12-01: 150 mg via INTRAVENOUS
  Filled 2013-12-01: qty 3.75

## 2013-12-01 MED ORDER — MIDAZOLAM HCL 2 MG/2ML IJ SOLN
INTRAMUSCULAR | Status: AC
  Filled 2013-12-01: qty 2

## 2013-12-01 MED ORDER — LORATADINE 10 MG PO TABS
10.0000 mg | ORAL_TABLET | Freq: Every day | ORAL | Status: DC
  Administered 2013-12-01 – 2013-12-05 (×5): 10 mg via ORAL
  Filled 2013-12-01 (×6): qty 1

## 2013-12-01 MED ORDER — ONDANSETRON HCL 4 MG/2ML IJ SOLN: 4.0000 mg | Freq: Four times a day (QID) | INTRAMUSCULAR | Status: DC | PRN

## 2013-12-01 MED ORDER — MORPHINE SULFATE 2 MG/ML IJ SOLN
1.0000 mg | INTRAMUSCULAR | Status: DC | PRN
  Filled 2013-12-01: qty 1

## 2013-12-01 MED ORDER — VANCOMYCIN HCL IN DEXTROSE 1-5 GM/200ML-% IV SOLN
1000.0000 mg | Freq: Two times a day (BID) | INTRAVENOUS | Status: DC
  Administered 2013-12-01 – 2013-12-04 (×8): 1000 mg via INTRAVENOUS
  Filled 2013-12-01 (×11): qty 200

## 2013-12-01 NOTE — Sedation Documentation (Signed)
Patient denies pain and is resting comfortably.  

## 2013-12-01 NOTE — ED Provider Notes (Signed)
I saw and evaluated the patient, reviewed the resident's note and I agree with the findings and plan.   EKG Interpretation None      Results for orders placed or performed during the hospital encounter of 11/30/13  CBC with Differential  Result Value Ref Range   WBC 14.5 (H) 4.0 - 10.5 K/uL   RBC 3.74 (L) 4.22 - 5.81 MIL/uL   Hemoglobin 10.8 (L) 13.0 - 17.0 g/dL   HCT 32.7 (L) 39.0 - 52.0 %   MCV 87.4 78.0 - 100.0 fL   MCH 28.9 26.0 - 34.0 pg   MCHC 33.0 30.0 - 36.0 g/dL   RDW 13.9 11.5 - 15.5 %   Platelets 377 150 - 400 K/uL   Neutrophils Relative % 83 (H) 43 - 77 %   Neutro Abs 12.1 (H) 1.7 - 7.7 K/uL   Lymphocytes Relative 7 (L) 12 - 46 %   Lymphs Abs 1.0 0.7 - 4.0 K/uL   Monocytes Relative 9 3 - 12 %   Monocytes Absolute 1.3 (H) 0.1 - 1.0 K/uL   Eosinophils Relative 1 0 - 5 %   Eosinophils Absolute 0.1 0.0 - 0.7 K/uL   Basophils Relative 0 0 - 1 %   Basophils Absolute 0.0 0.0 - 0.1 K/uL  Comprehensive metabolic panel  Result Value Ref Range   Sodium 134 (L) 137 - 147 mEq/L   Potassium 4.1 3.7 - 5.3 mEq/L   Chloride 95 (L) 96 - 112 mEq/L   CO2 24 19 - 32 mEq/L   Glucose, Bld 116 (H) 70 - 99 mg/dL   BUN 15 6 - 23 mg/dL   Creatinine, Ser 0.83 0.50 - 1.35 mg/dL   Calcium 9.3 8.4 - 10.5 mg/dL   Total Protein 8.7 (H) 6.0 - 8.3 g/dL   Albumin 2.9 (L) 3.5 - 5.2 g/dL   AST 24 0 - 37 U/L   ALT 18 0 - 53 U/L   Alkaline Phosphatase 101 39 - 117 U/L   Total Bilirubin 0.3 0.3 - 1.2 mg/dL   GFR calc non Af Amer 85 (L) >90 mL/min   GFR calc Af Amer >90 >90 mL/min   Anion gap 15 5 - 15  Lipase, blood  Result Value Ref Range   Lipase 17 11 - 59 U/L  Urinalysis, Routine w reflex microscopic  Result Value Ref Range   Color, Urine YELLOW YELLOW   APPearance CLOUDY (A) CLEAR   Specific Gravity, Urine 1.030 1.005 - 1.030   pH 5.0 5.0 - 8.0   Glucose, UA NEGATIVE NEGATIVE mg/dL   Hgb urine dipstick TRACE (A) NEGATIVE   Bilirubin Urine NEGATIVE NEGATIVE   Ketones, ur 15 (A)  NEGATIVE mg/dL   Protein, ur 30 (A) NEGATIVE mg/dL   Urobilinogen, UA 0.2 0.0 - 1.0 mg/dL   Nitrite NEGATIVE NEGATIVE   Leukocytes, UA NEGATIVE NEGATIVE  Urine microscopic-add on  Result Value Ref Range   WBC, UA 0-2 <3 WBC/hpf   RBC / HPF 0-2 <3 RBC/hpf   Bacteria, UA RARE RARE   Crystals CA OXALATE CRYSTALS (A) NEGATIVE   Urine-Other MUCOUS PRESENT   I-Stat CG4 Lactic Acid, ED  Result Value Ref Range   Lactic Acid, Venous 1.14 0.5 - 2.2 mmol/L   Ct Abdomen Pelvis W Contrast  11/30/2013   CLINICAL DATA:  Generalized abdominal pain and leukocytosis. Symptoms have been present since hernia repair September 2015.  EXAM: CT ABDOMEN AND PELVIS WITH CONTRAST  TECHNIQUE: Multidetector CT imaging of the  abdomen and pelvis was performed using the standard protocol following bolus administration of intravenous contrast.  CONTRAST:  154mL OMNIPAQUE IOHEXOL 300 MG/ML  SOLN  COMPARISON:  08/22/2008  FINDINGS: BODY WALL: There is a ventral abdominal wall hernia repair using mesh. The upper margin of the mesh is difficult to discern, but it is at least contiguous with an irregularly shaped fluid collection in the anterior peritoneal or extraperitoneal intra-abdominal space. This peripherally enhancing collection measures at 9 cm in craniocaudal span by up to 14 cm in transverse span by 1.3 cm in maximal thickness. The collection decompresses into the subcutaneous space through defects in the bilateral anterior abdominal wall. The largest pocket of the subcutaneous fluid collection measures approximately 7 cm in diameter by 3 cm in thickness. No subcutaneous gas or evidence of fistula.  LOWER CHEST: Unremarkable.  ABDOMEN/PELVIS:  Liver: No focal abnormality.  Biliary: Cholelithiasis. No evidence for biliary obstruction or perforation.  Pancreas: Unremarkable.  Spleen: Unremarkable.  Adrenals: Unremarkable.  Kidneys and ureters: No hydronephrosis or stone. 2 cm exophytic cyst from the left lower pole.  Bladder:  Unremarkable.  Reproductive: Unremarkable.  Bowel: No obstruction. Negative appendix. Status post sigmoidectomy.  Retroperitoneum: No mass or adenopathy.  Peritoneum: No ascites or pneumoperitoneum.  Vascular: No acute abnormality.  OSSEOUS: Bilateral L5 pars defects with grade 1-2 anterolisthesis.  IMPRESSION: Extensive fluid collection, likely abscess, both superficial and deep to the anterior abdominal wall. The collection is contiguous with mesh used for hernia repair.   Electronically Signed   By: Jorje Guild M.D.   On: 11/30/2013 21:31    Patient status post hernia repair in September. They used mesh. Clinically suspicious for infection. CT scan shows evidence of the anterior abdominal wall fluid collection most likely abscess. Consult to Gen. Surgery they will admit the patient. Symptoms developed about 3 days ago left lower abdomen hot to touch and redness noted in that area. Patient with a leukocytosis. Nontoxic no acute distress.  Fredia Sorrow, MD 12/01/13 0010

## 2013-12-01 NOTE — Consult Note (Signed)
Chief Complaint: Chief Complaint  Patient presents with  . Abdominal Pain  fever Abd fluid collection  Referring Physician(s): [facility]  History of Present Illness: Preston Reeves is a 73 y.o. male  Pt with Hx sigmoid diverticulitis Hartmanns procedure 2012 Recent ventral hernia repair 09/2013 Returns with abd pain and fever Leukocytosis CT reveals abdominal fluid collection [facility] request IR consult for aspiration/drain placement Dr Preston Reeves has reviewed chart and imaging Feels pt appropriate candidate for same I have seen and examined pt  Past Medical History  Diagnosis Date  . MVP (mitral valve prolapse)   . Diverticulosis     hx of  . History of BPH   . Hemochromatosis     hx of  . History of COPD   . Thyroid disease   . Incisional hernia   . Hyperlipidemia   . Diverticulitis of colon with perforation 2010    Colectomy/ostomy  . Colon polyps     Adenomatous  . Allergy     SEASONAL  . Cataract   . Hypothyroidism     Past Surgical History  Procedure Laterality Date  . Inguinal hernia repair Left 1999    Dr. Rebekah Reeves  . Partial colectomy  08/12/2008    Preston Reeves  . Colostomy takedown  2010    Dr Preston Reeves  . Colonoscopy w/ biopsies  2011    Dr Preston Reeves  . Colonoscopy    . Upper gi endoscopy    . Ventral hernia repair N/A 10/13/2013    Procedure: REPAIR OF VENTRAL WALL HERNIA WITH UNILATERAL COMPARTMENT SEPARATION;  Surgeon: Preston Boston, MD;  Location: [place];  Service: General;  Laterality: N/A;  . Insertion of mesh N/A 10/13/2013    Procedure: INSERTION OF MESH;  Surgeon: Preston Boston, MD;  Location: [place];  Service: General;  Laterality: N/A;  . Laparoscopic lysis of adhesions N/A 10/13/2013    Procedure: LAPAROSCOPIC LYSIS OF ADHESIONS;  Surgeon: Preston Boston, MD;  Location: [facility];  Service: General;  Laterality: N/A;    Allergies: Review of patient's allergies indicates no known allergies.  Medications: Prior to Admission medications   Medication Sig Start Date  End Date Taking? Authorizing Provider  acetaminophen (TYLENOL) 500 MG tablet Take 100 mg by mouth every 8 (eight) hours as needed for fever.   Yes Historical Provider, MD  Ascorbic Acid (VITAMIN C) 1000 MG tablet Take 1,000 mg by mouth daily.     Yes Historical Provider, MD  aspirin 81 MG tablet Take 81 mg by mouth 2 (two) times daily.    Yes Historical Provider, MD  Biotin 10 MG CAPS Take 10 mg by mouth daily.    Yes Historical Provider, MD  Calcium Carbonate-Vitamin D (CALCIUM + D PO) Take 1 tablet by mouth daily.    Yes Historical Provider, MD  Cholecalciferol (VITAMIN D) 2000 UNITS CAPS Take 2,000 Units by mouth daily.    Yes Historical Provider, MD  fexofenadine-pseudoephedrine (ALLEGRA-D 24) 180-240 MG per 24 hr tablet Take 1 tablet by mouth daily.   Yes Historical Provider, MD  Glucosamine 500 MG CAPS Take 500 mg by mouth daily.    Yes Historical Provider, MD  methocarbamol (ROBAXIN) 750 MG tablet Take 1 tablet (750 mg total) by mouth 4 (four) times daily as needed (use for muscle cramps/pain). 10/13/13  Yes Preston Boston, MD  Methylcellulose, Laxative, (CITRUCEL PO) Take 2 tablets by mouth 2 (two) times daily.    Yes Historical Provider, MD  Multiple Vitamins-Minerals (MULTIVITAMIN PO) Take 1 tablet  by mouth daily.    Yes Historical Provider, MD  Omega-3 Fatty Acids (FISH OIL PO) Take 900 mg by mouth daily. Taking 900 daily   Yes Historical Provider, MD  oxyCODONE (OXY IR/ROXICODONE) 5 MG immediate release tablet Take 1-2 tablets (5-10 mg total) by mouth every 4 (four) hours as needed for moderate pain, severe pain or breakthrough pain. 10/13/13  Yes Preston Boston, MD  Red Yeast Rice 600 MG CAPS Take 1,200 mg by mouth daily.    Yes Historical Provider, MD  SALINE NASAL SPRAY NA Place 1 spray into the nose daily as needed (allergies).    Yes Historical Provider, MD  SYNTHROID 137 MCG tablet Take 1 tablet (137 mcg total) by mouth daily before breakfast. 12/28/12  Yes Elayne Snare, MD  vitamin B-12  (CYANOCOBALAMIN) 1000 MCG tablet Take 1,000 mcg by mouth daily.   Yes Historical Provider, MD  loratadine (CLARITIN) 10 MG tablet Take 10 mg by mouth daily as needed for allergies. As needed    Historical Provider, MD    Family History  Problem Relation Age of Onset  . Breast cancer Mother   . Colon cancer Father   . Heart disease Father   . Diabetes Father     History   Social History  . Marital Status: Married    Spouse Name: N/A    Number of Children: N/A  . Years of Education: N/A   Occupational History  . Retired    Social History Main Topics  . Smoking status: Former Smoker    Types: Cigarettes  . Smokeless tobacco: Never Used  . Alcohol Use: Yes     Comment: drinks beer weekly  . Drug Use: No  . Sexual Activity: Yes   Other Topics Concern  . None   Social History Narrative   Married. Education: [employer].    Review of Systems: A 12 point ROS discussed and pertinent positives are indicated in the HPI above.  All other systems are negative.  Review of Systems  Constitutional: Positive for fever, activity change and appetite change.  Respiratory: Negative for cough and shortness of breath.   Gastrointestinal: Positive for abdominal pain and abdominal distention.  Genitourinary: Negative for difficulty urinating.  Musculoskeletal: Negative for back pain.  Psychiatric/Behavioral: Negative for behavioral problems and confusion.    Vital Signs: BP 129/59 mmHg  Pulse 95  Temp(Src) 100.8 F (38.2 C) (Oral)  Resp 18  Ht 5\' 11"  (1.803 m)  Wt 83.462 kg (184 lb)  BMI 25.67 kg/m2  SpO2 95%  Physical Exam  Constitutional: He appears well-nourished.  Cardiovascular: Normal rate and regular rhythm.   No murmur heard. Pulmonary/Chest: Effort normal and breath sounds normal. He has no wheezes.  Abdominal: Soft. Bowel sounds are normal. There is tenderness.  Musculoskeletal: Normal range of motion.  Neurological: He is alert. He has normal reflexes.  Skin: Skin  is warm.  Psychiatric: He has a normal mood and affect. His behavior is normal. Judgment and thought content normal.  Nursing note and vitals reviewed.   Imaging: Ct Abdomen Pelvis W Contrast  11/30/2013   CLINICAL DATA:  Generalized abdominal pain and leukocytosis. Symptoms have been present since hernia repair September 2015.  EXAM: CT ABDOMEN AND PELVIS WITH CONTRAST  TECHNIQUE: Multidetector CT imaging of the abdomen and pelvis was performed using the standard protocol following bolus administration of intravenous contrast.  CONTRAST:  137mL OMNIPAQUE IOHEXOL 300 MG/ML  SOLN  COMPARISON:  08/22/2008  FINDINGS: BODY WALL: There is a  ventral abdominal wall hernia repair using mesh. The upper margin of the mesh is difficult to discern, but it is at least contiguous with an irregularly shaped fluid collection in the anterior peritoneal or extraperitoneal intra-abdominal space. This peripherally enhancing collection measures at 9 cm in craniocaudal span by up to 14 cm in transverse span by 1.3 cm in maximal thickness. The collection decompresses into the subcutaneous space through defects in the bilateral anterior abdominal wall. The largest pocket of the subcutaneous fluid collection measures approximately 7 cm in diameter by 3 cm in thickness. No subcutaneous gas or evidence of fistula.  LOWER CHEST: Unremarkable.  ABDOMEN/PELVIS:  Liver: No focal abnormality.  Biliary: Cholelithiasis. No evidence for biliary obstruction or perforation.  Pancreas: Unremarkable.  Spleen: Unremarkable.  Adrenals: Unremarkable.  Kidneys and ureters: No hydronephrosis or stone. 2 cm exophytic cyst from the left lower pole.  Bladder: Unremarkable.  Reproductive: Unremarkable.  Bowel: No obstruction. Negative appendix. Status post sigmoidectomy.  Retroperitoneum: No mass or adenopathy.  Peritoneum: No ascites or pneumoperitoneum.  Vascular: No acute abnormality.  OSSEOUS: Bilateral L5 pars defects with grade 1-2 anterolisthesis.   IMPRESSION: Extensive fluid collection, likely abscess, both superficial and deep to the anterior abdominal wall. The collection is contiguous with mesh used for hernia repair.   Electronically Signed   By: Jorje Guild M.D.   On: 11/30/2013 21:31    Labs:  CBC:  Recent Labs  10/06/13 0942 10/15/13 1155 11/30/13 1806 11/30/13 1924 12/01/13 0619  WBC 4.3 8.0 15.8* 14.5* 13.0*  HGB 13.4 12.3* 11.0* 10.8* 9.8*  HCT 40.5 36.3* 34.4* 32.7* 29.3*  PLT 249 238  --  377 321    COAGS: No results for input(s): INR, APTT in the last 8760 hours.  BMP:  Recent Labs  10/06/13 0942 10/15/13 1155 11/30/13 1924 12/01/13 0619  NA 141 136* 134* 135*  K 3.9 4.2 4.1 4.1  CL 104 97 95* 99  CO2 26 29 24 23   GLUCOSE 90 89 116* 131*  BUN 14 11 15 12   CALCIUM 9.6 9.3 9.3 8.7  CREATININE 0.85 0.79 0.83 0.78  GFRNONAA 84* 87* 85* 87*  GFRAA >90 >90 >90 >90    LIVER FUNCTION TESTS:  Recent Labs  06/20/13 1408 11/30/13 1924  BILITOT 0.8 0.3  AST 24 24  ALT 14 18  ALKPHOS 56 101  PROT 7.4 8.7*  ALBUMIN 3.9 2.9*    TUMOR MARKERS: No results for input(s): AFPTM, CEA, CA199, CHROMGRNA in the last 8760 hours.  Assessment and Plan:  Previous ventral hernia repair 09/2013 Now with abd pain/fever/leukocutosis CT shows abd fluid collection Now scheduled for aspiration vs drain placement Pt aware of procedure benefits and risks and agreeable to proceed Consent signed and in chart  Thank you for this interesting consult.  I greatly enjoyed meeting Preston Reeves and look forward to participating in their care.   I spent a total of 40 minutes face to face in clinical consultation, greater than 50% of which was counseling/coordinating care for abdominal fluid collection asp/drain  Signed: Islay Polanco A 12/01/2013, 8:19 AM

## 2013-12-01 NOTE — Progress Notes (Addendum)
Preston Reeves is a 73 y.o. male who has been consulted for aminoglycoside dosing.  The patient has a current serum creatinine of  CREAT (mg/dL)  Date Value  06/20/2013 1.01   CREATININE, SER (mg/dL)  Date Value  12/01/2013 0.78   which calculates to an estimated CrCl of Estimated Creatinine Clearance: 87.6 mL/min (by C-G formula based on Cr of 0.78).  Current weight is 83.5kg  The patient will be started on gentamicin at a dose of 150mg  IV x1 followed by 130mg  IV Q8H per dosing Nomogram and ideal body weight.  Patient will be followed by pharmacy for changes in renal function, toxicity, and efficacy.  Serum creatinines will be ordered as needed.  Wynona Neat, PharmD, BCPS 12/01/2013 7:29 AM   Addendum  Will change gent to extended interval dosing instead.   Plan  Change gent to 550mg  IV q24 10 hr level tomorrow  Onnie Boer, PharmD Pager: [phone] 12/01/2013 11:05 AM

## 2013-12-01 NOTE — Procedures (Signed)
Interventional Radiology Procedure Note  Procedure:  56F drain into left abdominal wall abscess.  US guidance. To bulb suction.  Findings:  80cc of purulent fluid aspirated.  Sample sent to lab.  Complications: No immediate Recommendations:  - Ok to shower tomorrow - Routine drain care -follow up cx's  Signed,  Dulcy Fanny. Earleen Newport, DO

## 2013-12-01 NOTE — Progress Notes (Signed)
Utilization review completed.  

## 2013-12-01 NOTE — Progress Notes (Addendum)
[facility]  [address] [place], [place] [address] Phone: [phone] FAX: [place] [phone] 08-21-1940  CARE TEAM:  PCP: Jenny Reichmann, MD  Outpatient Care Team: Patient Care Team: Darlyne Russian, MD as PCP - General (Family Medicine) Raynelle Bring, MD as Consulting Physician (Urology) Irene Shipper, MD as Consulting Physician (Gastroenterology) Darlin Coco, MD as Consulting Physician (Cardiology)  Inpatient Treatment Team: Treatment Team: Attending Provider: Michael Boston, MD; Technician: Jolyn Nap, NT; Technician: Vivianne Spence, NT (Inactive)   Subjective:  Patient at  interventional radiology.  Wife at bedside.    Events noted.  Objective:  Vital signs:  Filed Vitals:   12/01/13 1215 12/01/13 1219 12/01/13 1228 12/01/13 1322  BP: 125/70 127/66 140/77 113/64  Pulse: 99 96 99 89  Temp:    100.4 F (38 C)  TempSrc:      Resp: '22 24 20 18  ' Height:      Weight:      SpO2: 96% 96% 94% 98%    Last BM Date: 11/28/13  Intake/Output   Yesterday:  11/05 0701 - 11/06 0700 In: 793.3 [I.V.:543.3; IV Piggyback:250] Out: 50 [Urine:50] This shift:  Total I/O In: 53.8 [IV Piggyback:53.8] Out: 79 [Urine:440; Drains:80]  Bowel function:  Flatus: y  BM: n  Drain: pending  Physical Exam:    Problem List:   Active Problems:   Infected postoperative seroma   Assessment  [name]  73 y.o. male       Cellulitis and fluid collection and abdominal wall suspicious for infected seroma.  Plan:  Percutaneous drainage.  Continue IV antibiotics. Fluid off for culture.  When cellulitis resolves, transition to oral antibiotics with outpatient follow-up.  Doxycycline x 2 weeks written  Try and hold off on removal of mesh as long as infection controlled since seroma is in subcutaneous tissues and mesh is intraperitoneal  VTE prophylaxis- SCDs, etc  Mobilize as tolerated  to help recovery  Adin Hector, M.D., F.A.C.S. Gastrointestinal and Minimally Invasive Surgery [facility] [address] [place], [place] [address] [phone] Main / Paging   12/01/2013   Results:   Labs: Results for orders placed or performed during the hospital encounter of 11/30/13 (from the past 48 hour(s))  CBC with Differential     Status: Abnormal   Collection Time: 11/30/13  7:24 PM  Result Value Ref Range   WBC 14.5 (H) 4.0 - 10.5 K/uL   RBC 3.74 (L) 4.22 - 5.81 MIL/uL   Hemoglobin 10.8 (L) 13.0 - 17.0 g/dL   HCT 32.7 (L) 39.0 - 52.0 %   MCV 87.4 78.0 - 100.0 fL   MCH 28.9 26.0 - 34.0 pg   MCHC 33.0 30.0 - 36.0 g/dL   RDW 13.9 11.5 - 15.5 %   Platelets 377 150 - 400 K/uL   Neutrophils Relative % 83 (H) 43 - 77 %   Neutro Abs 12.1 (H) 1.7 - 7.7 K/uL   Lymphocytes Relative 7 (L) 12 - 46 %   Lymphs Abs 1.0 0.7 - 4.0 K/uL   Monocytes Relative 9 3 - 12 %   Monocytes Absolute 1.3 (H) 0.1 - 1.0 K/uL   Eosinophils Relative 1 0 - 5 %   Eosinophils Absolute 0.1 0.0 - 0.7 K/uL   Basophils Relative 0 0 - 1 %   Basophils Absolute 0.0 0.0 - 0.1 K/uL  Comprehensive metabolic panel  Status: Abnormal   Collection Time: 11/30/13  7:24 PM  Result Value Ref Range   Sodium 134 (L) 137 - 147 mEq/L   Potassium 4.1 3.7 - 5.3 mEq/L   Chloride 95 (L) 96 - 112 mEq/L   CO2 24 19 - 32 mEq/L   Glucose, Bld 116 (H) 70 - 99 mg/dL   BUN 15 6 - 23 mg/dL   Creatinine, Ser 0.83 0.50 - 1.35 mg/dL   Calcium 9.3 8.4 - 10.5 mg/dL   Total Protein 8.7 (H) 6.0 - 8.3 g/dL   Albumin 2.9 (L) 3.5 - 5.2 g/dL   AST 24 0 - 37 U/L   ALT 18 0 - 53 U/L   Alkaline Phosphatase 101 39 - 117 U/L   Total Bilirubin 0.3 0.3 - 1.2 mg/dL   GFR calc non Af Amer 85 (L) >90 mL/min   GFR calc Af Amer >90 >90 mL/min    Comment: (NOTE) The eGFR has been calculated using the CKD EPI equation. This calculation has not been validated in all clinical situations. eGFR's  persistently <90 mL/min signify possible Chronic Kidney Disease.    Anion gap 15 5 - 15  Lipase, blood     Status: None   Collection Time: 11/30/13  7:24 PM  Result Value Ref Range   Lipase 17 11 - 59 U/L  I-Stat CG4 Lactic Acid, ED     Status: None   Collection Time: 11/30/13  8:07 PM  Result Value Ref Range   Lactic Acid, Venous 1.14 0.5 - 2.2 mmol/L  Urinalysis, Routine w reflex microscopic     Status: Abnormal   Collection Time: 11/30/13  8:30 PM  Result Value Ref Range   Color, Urine YELLOW YELLOW   APPearance CLOUDY (A) CLEAR   Specific Gravity, Urine 1.030 1.005 - 1.030   pH 5.0 5.0 - 8.0   Glucose, UA NEGATIVE NEGATIVE mg/dL   Hgb urine dipstick TRACE (A) NEGATIVE   Bilirubin Urine NEGATIVE NEGATIVE   Ketones, ur 15 (A) NEGATIVE mg/dL   Protein, ur 30 (A) NEGATIVE mg/dL   Urobilinogen, UA 0.2 0.0 - 1.0 mg/dL   Nitrite NEGATIVE NEGATIVE   Leukocytes, UA NEGATIVE NEGATIVE  Urine microscopic-add on     Status: Abnormal   Collection Time: 11/30/13  8:30 PM  Result Value Ref Range   WBC, UA 0-2 <3 WBC/hpf   RBC / HPF 0-2 <3 RBC/hpf   Bacteria, UA RARE RARE   Crystals CA OXALATE CRYSTALS (A) NEGATIVE   Urine-Other MUCOUS PRESENT   Basic metabolic panel     Status: Abnormal   Collection Time: 12/01/13  6:19 AM  Result Value Ref Range   Sodium 135 (L) 137 - 147 mEq/L   Potassium 4.1 3.7 - 5.3 mEq/L   Chloride 99 96 - 112 mEq/L   CO2 23 19 - 32 mEq/L   Glucose, Bld 131 (H) 70 - 99 mg/dL   BUN 12 6 - 23 mg/dL   Creatinine, Ser 0.78 0.50 - 1.35 mg/dL   Calcium 8.7 8.4 - 10.5 mg/dL   GFR calc non Af Amer 87 (L) >90 mL/min   GFR calc Af Amer >90 >90 mL/min    Comment: (NOTE) The eGFR has been calculated using the CKD EPI equation. This calculation has not been validated in all clinical situations. eGFR's persistently <90 mL/min signify possible Chronic Kidney Disease.    Anion gap 13 5 - 15  CBC     Status: Abnormal   Collection Time:  12/01/13  6:19 AM  Result  Value Ref Range   WBC 13.0 (H) 4.0 - 10.5 K/uL   RBC 3.30 (L) 4.22 - 5.81 MIL/uL   Hemoglobin 9.8 (L) 13.0 - 17.0 g/dL   HCT 29.3 (L) 39.0 - 52.0 %   MCV 88.8 78.0 - 100.0 fL   MCH 29.7 26.0 - 34.0 pg   MCHC 33.4 30.0 - 36.0 g/dL   RDW 13.9 11.5 - 15.5 %   Platelets 321 150 - 400 K/uL  Protime-INR     Status: Abnormal   Collection Time: 12/01/13  8:34 AM  Result Value Ref Range   Prothrombin Time 16.4 (H) 11.6 - 15.2 seconds   INR 1.31 0.00 - 1.49  APTT     Status: Abnormal   Collection Time: 12/01/13  8:34 AM  Result Value Ref Range   aPTT 38 (H) 24 - 37 seconds    Comment:        IF BASELINE aPTT IS ELEVATED, SUGGEST PATIENT RISK ASSESSMENT BE USED TO DETERMINE APPROPRIATE ANTICOAGULANT THERAPY.     Imaging / Studies: [place] Abscess Drain  12/01/2013   CLINICAL DATA:  73 year old male with a history of anterior abdominal wall hernia repair. He has developed abdominal wall fluid collections suspicious for abscess.  EXAM: ULTRASOUND GUIDED DRAINAGE OF ABDOMINAL WALL ABSCESS  MEDICATIONS: 1.0 mg IV Versed; 50 mcg IV Fentanyl  Total Moderate Sedation Time: 18 MIN  PROCEDURE: The procedure, risks, benefits, and alternatives were explained to the patient. Questions regarding the procedure were encouraged and answered. The patient understands and consents to the procedure.  Ultrasound survey of the left abdominal wall was performed for planning purposes. Image were stored and sent to PACs.  The left anterior abdominal wall was prepped with Betadine in a sterile fashion, and a sterile drape was applied covering the operative field. A sterile gown and sterile gloves were used for the procedure. Local anesthesia was provided with 1% Lidocaine.  After infiltration of the skin and subcutaneous tissues a small stab incision was made with 11 blade scalpel. A 10 French pigtail drain was placed into the fluid collection with a trocar technique under ultrasound guidance. Once a drain was not placed  approximately 80 cc of purulent fluid was removed.  The drain was sutured in position.  The drain was attached to bulb suction.  Final images were stored.  The patient tolerated the procedure well and remained hemodynamically stable throughout.  No complications were encountered and no significant blood loss was encounter.  COMPLICATIONS: None.  FINDINGS: Fluid collection on the anterior abdominal wall.  80 cc of purulent fluid was aspirated.  Sample was sent to the lab.  IMPRESSION: Status post ultrasound-guided drainage of left anterior abdominal wall abscess with 80 cc of purulent fluid aspirated. Sample was sent to the lab.  Signed,  Dulcy Fanny. Earleen Newport, DO  [facility]  [facility]   Electronically Signed   By: Corrie Mckusick D.O.   On: 12/01/2013 12:44   Ct Abdomen Pelvis W Contrast  11/30/2013   CLINICAL DATA:  Generalized abdominal pain and leukocytosis. Symptoms have been present since hernia repair September 2015.  EXAM: CT ABDOMEN AND PELVIS WITH CONTRAST  TECHNIQUE: Multidetector CT imaging of the abdomen and pelvis was performed using the standard protocol following bolus administration of intravenous contrast.  CONTRAST:  130m OMNIPAQUE IOHEXOL 300 MG/ML  SOLN  COMPARISON:  08/22/2008  FINDINGS: BODY WALL: There is a ventral abdominal wall hernia repair  using mesh. The upper margin of the mesh is difficult to discern, but it is at least contiguous with an irregularly shaped fluid collection in the anterior peritoneal or extraperitoneal intra-abdominal space. This peripherally enhancing collection measures at 9 cm in craniocaudal span by up to 14 cm in transverse span by 1.3 cm in maximal thickness. The collection decompresses into the subcutaneous space through defects in the bilateral anterior abdominal wall. The largest pocket of the subcutaneous fluid collection measures approximately 7 cm in diameter by 3 cm in thickness. No subcutaneous gas or evidence  of fistula.  LOWER CHEST: Unremarkable.  ABDOMEN/PELVIS:  Liver: No focal abnormality.  Biliary: Cholelithiasis. No evidence for biliary obstruction or perforation.  Pancreas: Unremarkable.  Spleen: Unremarkable.  Adrenals: Unremarkable.  Kidneys and ureters: No hydronephrosis or stone. 2 cm exophytic cyst from the left lower pole.  Bladder: Unremarkable.  Reproductive: Unremarkable.  Bowel: No obstruction. Negative appendix. Status post sigmoidectomy.  Retroperitoneum: No mass or adenopathy.  Peritoneum: No ascites or pneumoperitoneum.  Vascular: No acute abnormality.  OSSEOUS: Bilateral L5 pars defects with grade 1-2 anterolisthesis.  IMPRESSION: Extensive fluid collection, likely abscess, both superficial and deep to the anterior abdominal wall. The collection is contiguous with mesh used for hernia repair.   Electronically Signed   By: Jorje Guild M.D.   On: 11/30/2013 21:31    Medications / Allergies: per chart  Antibiotics: Anti-infectives    Start     Dose/Rate Route Frequency Ordered Stop   12/02/13 0000  gentamicin (GARAMYCIN) 550 mg in dextrose 5 % 100 mL IVPB     7 mg/kg  78.6 kg (Adjusted)113.8 mL/hr over 60 Minutes Intravenous Every 24 hours 12/01/13 1104     12/01/13 1600  gentamicin (GARAMYCIN) 130 mg in dextrose 5 % 50 mL IVPB  Status:  Discontinued     130 mg106.5 mL/hr over 30 Minutes Intravenous Every 8 hours 12/01/13 0730 12/01/13 1059   12/01/13 0745  gentamicin (GARAMYCIN) 150 mg in dextrose 5 % 50 mL IVPB     150 mg107.5 mL/hr over 30 Minutes Intravenous  Once 12/01/13 0730 12/01/13 0926   12/01/13 0100  vancomycin (VANCOCIN) IVPB 1000 mg/200 mL premix     1,000 mg200 mL/hr over 60 Minutes Intravenous Every 12 hours 12/01/13 0051     12/01/13 0045  piperacillin-tazobactam (ZOSYN) IVPB 3.375 g     3.375 g12.5 mL/hr over 240 Minutes Intravenous 3 times per day 12/01/13 0043     12/01/13 0000  doxycycline (VIBRA-TABS) 100 MG tablet     100 mg Oral 2 times daily 12/01/13  1225         Note: Portions of this report may have been transcribed using voice recognition software. Every effort was made to ensure accuracy; however, inadvertent computerized transcription errors may be present.   Any transcriptional errors that result from this process are unintentional.

## 2013-12-01 NOTE — Progress Notes (Signed)
ANTIBIOTIC CONSULT NOTE - INITIAL  Pharmacy Consult for vancomycin Indication: cellulitis  No Known Allergies  Patient Measurements: Height: 5\' 11"  (180.3 cm) Weight: 184 lb (83.462 kg) IBW/kg (Calculated) : 75.3  Vital Signs: Temp: 98.2 F (36.8 C) (11/06 0000) Temp Source: Oral (11/06 0000) BP: 148/75 mmHg (11/06 0000) Pulse Rate: 89 (11/06 0015)  Labs:  Recent Labs  11/30/13 1806 11/30/13 1924  WBC 15.8* 14.5*  HGB 11.0* 10.8*  PLT  --  377  CREATININE  --  0.83   Estimated Creatinine Clearance: 84.4 mL/min (by C-G formula based on Cr of 0.83).    Medical History: Past Medical History  Diagnosis Date  . MVP (mitral valve prolapse)   . Diverticulosis     hx of  . History of BPH   . Hemochromatosis     hx of  . History of COPD   . Thyroid disease   . Incisional hernia   . Hyperlipidemia   . Diverticulitis of colon with perforation 2010    Colectomy/ostomy  . Colon polyps     Adenomatous  . Allergy     SEASONAL  . Cataract   . Hypothyroidism     Medications:  Prescriptions prior to admission  Medication Sig Dispense Refill Last Dose  . acetaminophen (TYLENOL) 500 MG tablet Take 100 mg by mouth every 8 (eight) hours as needed for fever.   11/30/2013 at Unknown time  . Ascorbic Acid (VITAMIN C) 1000 MG tablet Take 1,000 mg by mouth daily.     11/30/2013 at Unknown time  . aspirin 81 MG tablet Take 81 mg by mouth 2 (two) times daily.    11/30/2013 at Unknown time  . Biotin 10 MG CAPS Take 10 mg by mouth daily.    11/30/2013 at Unknown time  . Calcium Carbonate-Vitamin D (CALCIUM + D PO) Take 1 tablet by mouth daily.    11/30/2013 at Unknown time  . Cholecalciferol (VITAMIN D) 2000 UNITS CAPS Take 2,000 Units by mouth daily.    11/30/2013 at Unknown time  . fexofenadine-pseudoephedrine (ALLEGRA-D 24) 180-240 MG per 24 hr tablet Take 1 tablet by mouth daily.   11/30/2013 at Unknown time  . Glucosamine 500 MG CAPS Take 500 mg by mouth daily.    11/30/2013 at  Unknown time  . methocarbamol (ROBAXIN) 750 MG tablet Take 1 tablet (750 mg total) by mouth 4 (four) times daily as needed (use for muscle cramps/pain). 30 tablet 2 11/30/2013 at Unknown time  . Methylcellulose, Laxative, (CITRUCEL PO) Take 2 tablets by mouth 2 (two) times daily.    11/30/2013 at Unknown time  . Multiple Vitamins-Minerals (MULTIVITAMIN PO) Take 1 tablet by mouth daily.    11/30/2013 at Unknown time  . Omega-3 Fatty Acids (FISH OIL PO) Take 900 mg by mouth daily. Taking 900 daily   11/30/2013 at Unknown time  . oxyCODONE (OXY IR/ROXICODONE) 5 MG immediate release tablet Take 1-2 tablets (5-10 mg total) by mouth every 4 (four) hours as needed for moderate pain, severe pain or breakthrough pain. 50 tablet 0 11/30/2013 at Unknown time  . Red Yeast Rice 600 MG CAPS Take 1,200 mg by mouth daily.    11/30/2013 at Unknown time  . SALINE NASAL SPRAY NA Place 1 spray into the nose daily as needed (allergies).    Past Week at Unknown time  . SYNTHROID 137 MCG tablet Take 1 tablet (137 mcg total) by mouth daily before breakfast. 90 tablet 3 11/30/2013 at Unknown time  .  vitamin B-12 (CYANOCOBALAMIN) 1000 MCG tablet Take 1,000 mcg by mouth daily.   11/30/2013 at Unknown time  . loratadine (CLARITIN) 10 MG tablet Take 10 mg by mouth daily as needed for allergies. As needed   Taking   Scheduled:  . levothyroxine  137 mcg Oral QAC breakfast  . loratadine  10 mg Oral Daily  . piperacillin-tazobactam (ZOSYN)  IV  3.375 g Intravenous 3 times per day  . senna  1 tablet Oral BID   Infusions:  . dextrose 5 % and 0.45 % NaCl with KCl 20 mEq/L      Assessment: 73yo male had abdominal surgery for hernia in Sept, c/o abdominal discomfort since then, 3d ago pt noticed a "knot" to left lower abdomen w/ erythema and warmth, also reports fever to 102, WBC at PCP was 15.8, instructed to go to ED, now admitted for surgical eval, to begin IV ABX for cellulitis w/ possible abscess.  Goal of Therapy:  Vancomycin  trough level 10-15 mcg/ml  Plan:  Will start vancomycin 1000mg  IV Q12H and monitor CBC, Cx, levels prn.  Wynona Neat, PharmD, BCPS  12/01/2013,12:44 AM

## 2013-12-02 LAB — GENTAMICIN LEVEL, RANDOM: GENTAMICIN RM: 3.4 ug/mL

## 2013-12-02 MED ORDER — MAGNESIUM HYDROXIDE 400 MG/5ML PO SUSP
15.0000 mL | Freq: Every day | ORAL | Status: DC
  Administered 2013-12-02: 15 mL via ORAL
  Administered 2013-12-03: 10:00:00 via ORAL
  Administered 2013-12-05 – 2013-12-06 (×2): 15 mL via ORAL
  Filled 2013-12-02 (×5): qty 30

## 2013-12-02 MED ORDER — DOCUSATE SODIUM 100 MG PO CAPS: 100.0000 mg | ORAL_CAPSULE | Freq: Two times a day (BID) | ORAL | Status: DC | PRN

## 2013-12-02 NOTE — Plan of Care (Signed)
Problem: Phase I Progression Outcomes Goal: Voiding-avoid urinary catheter unless indicated Outcome: Completed/Met Date Met:  12/02/13     

## 2013-12-02 NOTE — Progress Notes (Signed)
Referring Physician(s): CCS  Subjective:  L abd drain placed 11/6 Pt some better Still complains of no BM Tmax 100.7   Allergies: Review of patient's allergies indicates no known allergies.  Medications: Prior to Admission medications   Medication Sig Start Date End Date Taking? Authorizing Provider  acetaminophen (TYLENOL) 500 MG tablet Take 100 mg by mouth every 8 (eight) hours as needed for fever.   Yes Historical Provider, MD  Ascorbic Acid (VITAMIN C) 1000 MG tablet Take 1,000 mg by mouth daily.     Yes Historical Provider, MD  aspirin 81 MG tablet Take 81 mg by mouth 2 (two) times daily.    Yes Historical Provider, MD  Biotin 10 MG CAPS Take 10 mg by mouth daily.    Yes Historical Provider, MD  Calcium Carbonate-Vitamin D (CALCIUM + D PO) Take 1 tablet by mouth daily.    Yes Historical Provider, MD  Cholecalciferol (VITAMIN D) 2000 UNITS CAPS Take 2,000 Units by mouth daily.    Yes Historical Provider, MD  fexofenadine-pseudoephedrine (ALLEGRA-D 24) 180-240 MG per 24 hr tablet Take 1 tablet by mouth daily.   Yes Historical Provider, MD  Glucosamine 500 MG CAPS Take 500 mg by mouth daily.    Yes Historical Provider, MD  methocarbamol (ROBAXIN) 750 MG tablet Take 1 tablet (750 mg total) by mouth 4 (four) times daily as needed (use for muscle cramps/pain). 10/13/13  Yes Michael Boston, MD  Methylcellulose, Laxative, (CITRUCEL PO) Take 2 tablets by mouth 2 (two) times daily.    Yes Historical Provider, MD  Multiple Vitamins-Minerals (MULTIVITAMIN PO) Take 1 tablet by mouth daily.    Yes Historical Provider, MD  Omega-3 Fatty Acids (FISH OIL PO) Take 900 mg by mouth daily. Taking 900 daily   Yes Historical Provider, MD  oxyCODONE (OXY IR/ROXICODONE) 5 MG immediate release tablet Take 1-2 tablets (5-10 mg total) by mouth every 4 (four) hours as needed for moderate pain, severe pain or breakthrough pain. 10/13/13  Yes Michael Boston, MD  Red Yeast Rice 600 MG CAPS Take 1,200 mg by mouth  daily.    Yes Historical Provider, MD  SALINE NASAL SPRAY NA Place 1 spray into the nose daily as needed (allergies).    Yes Historical Provider, MD  SYNTHROID 137 MCG tablet Take 1 tablet (137 mcg total) by mouth daily before breakfast. 12/28/12  Yes Elayne Snare, MD  vitamin B-12 (CYANOCOBALAMIN) 1000 MCG tablet Take 1,000 mcg by mouth daily.   Yes Historical Provider, MD  doxycycline (VIBRA-TABS) 100 MG tablet Take 1 tablet (100 mg total) by mouth 2 (two) times daily. 12/01/13   Michael Boston, MD  loratadine (CLARITIN) 10 MG tablet Take 10 mg by mouth daily as needed for allergies. As needed    Historical Provider, MD    Review of Systems  Vital Signs: BP 124/75 mmHg  Pulse 85  Temp(Src) 100.7 F (38.2 C) (Oral)  Resp 18  Ht 5\' 11"  (1.803 m)  Wt 83.462 kg (184 lb)  BMI 25.67 kg/m2  SpO2 93%  Physical Exam  Abdominal:  Site clean and dry NT no bleeding Output 80 cc at time of placement None in JP this am Flushed with 6 cc sterile saline Output bloody; 20 cc in bulb now Cx: abundant wbcs    Imaging: [place] Abscess Drain  12/01/2013   CLINICAL DATA:  73 year old male with a history of anterior abdominal wall hernia repair. He has developed abdominal wall fluid collections suspicious for abscess.  EXAM: ULTRASOUND  GUIDED DRAINAGE OF ABDOMINAL WALL ABSCESS  MEDICATIONS: 1.0 mg IV Versed; 50 mcg IV Fentanyl  Total Moderate Sedation Time: 18 MIN  PROCEDURE: The procedure, risks, benefits, and alternatives were explained to the patient. Questions regarding the procedure were encouraged and answered. The patient understands and consents to the procedure.  Ultrasound survey of the left abdominal wall was performed for planning purposes. Image were stored and sent to PACs.  The left anterior abdominal wall was prepped with Betadine in a sterile fashion, and a sterile drape was applied covering the operative field. A sterile gown and sterile gloves were used for the procedure. Local anesthesia was  provided with 1% Lidocaine.  After infiltration of the skin and subcutaneous tissues a small stab incision was made with 11 blade scalpel. A 10 French pigtail drain was placed into the fluid collection with a trocar technique under ultrasound guidance. Once a drain was not placed approximately 80 cc of purulent fluid was removed.  The drain was sutured in position.  The drain was attached to bulb suction.  Final images were stored.  The patient tolerated the procedure well and remained hemodynamically stable throughout.  No complications were encountered and no significant blood loss was encounter.  COMPLICATIONS: None.  FINDINGS: Fluid collection on the anterior abdominal wall.  80 cc of purulent fluid was aspirated.  Sample was sent to the lab.  IMPRESSION: Status post ultrasound-guided drainage of left anterior abdominal wall abscess with 80 cc of purulent fluid aspirated. Sample was sent to the lab.  Signed,  Dulcy Fanny. Earleen Newport, DO  [facility]  [facility]   Electronically Signed   By: Corrie Mckusick D.O.   On: 12/01/2013 12:44   Ct Abdomen Pelvis W Contrast  11/30/2013   CLINICAL DATA:  Generalized abdominal pain and leukocytosis. Symptoms have been present since hernia repair September 2015.  EXAM: CT ABDOMEN AND PELVIS WITH CONTRAST  TECHNIQUE: Multidetector CT imaging of the abdomen and pelvis was performed using the standard protocol following bolus administration of intravenous contrast.  CONTRAST:  118mL OMNIPAQUE IOHEXOL 300 MG/ML  SOLN  COMPARISON:  08/22/2008  FINDINGS: BODY WALL: There is a ventral abdominal wall hernia repair using mesh. The upper margin of the mesh is difficult to discern, but it is at least contiguous with an irregularly shaped fluid collection in the anterior peritoneal or extraperitoneal intra-abdominal space. This peripherally enhancing collection measures at 9 cm in craniocaudal span by up to 14 cm in transverse span by 1.3 cm in  maximal thickness. The collection decompresses into the subcutaneous space through defects in the bilateral anterior abdominal wall. The largest pocket of the subcutaneous fluid collection measures approximately 7 cm in diameter by 3 cm in thickness. No subcutaneous gas or evidence of fistula.  LOWER CHEST: Unremarkable.  ABDOMEN/PELVIS:  Liver: No focal abnormality.  Biliary: Cholelithiasis. No evidence for biliary obstruction or perforation.  Pancreas: Unremarkable.  Spleen: Unremarkable.  Adrenals: Unremarkable.  Kidneys and ureters: No hydronephrosis or stone. 2 cm exophytic cyst from the left lower pole.  Bladder: Unremarkable.  Reproductive: Unremarkable.  Bowel: No obstruction. Negative appendix. Status post sigmoidectomy.  Retroperitoneum: No mass or adenopathy.  Peritoneum: No ascites or pneumoperitoneum.  Vascular: No acute abnormality.  OSSEOUS: Bilateral L5 pars defects with grade 1-2 anterolisthesis.  IMPRESSION: Extensive fluid collection, likely abscess, both superficial and deep to the anterior abdominal wall. The collection is contiguous with mesh used for hernia repair.   Electronically Signed   By: Roderic Palau  Watts M.D.   On: 11/30/2013 21:31    Labs:  CBC:  Recent Labs  10/06/13 0942 10/15/13 1155 11/30/13 1806 11/30/13 1924 12/01/13 0619  WBC 4.3 8.0 15.8* 14.5* 13.0*  HGB 13.4 12.3* 11.0* 10.8* 9.8*  HCT 40.5 36.3* 34.4* 32.7* 29.3*  PLT 249 238  --  377 321    COAGS:  Recent Labs  12/01/13 0834  INR 1.31  APTT 38*    BMP:  Recent Labs  10/06/13 0942 10/15/13 1155 11/30/13 1924 12/01/13 0619  NA 141 136* 134* 135*  K 3.9 4.2 4.1 4.1  CL 104 97 95* 99  CO2 26 29 24 23   GLUCOSE 90 89 116* 131*  BUN 14 11 15 12   CALCIUM 9.6 9.3 9.3 8.7  CREATININE 0.85 0.79 0.83 0.78  GFRNONAA 84* 87* 85* 87*  GFRAA >90 >90 >90 >90    LIVER FUNCTION TESTS:  Recent Labs  06/20/13 1408 11/30/13 1924  BILITOT 0.8 0.3  AST 24 24  ALT 14 18  ALKPHOS 56 101  PROT  7.4 8.7*  ALBUMIN 3.9 2.9*    Assessment and Plan:  L abd abscess drain placed 11/6 Flushes well Bloody output Will follow     I spent a total of 15 minutes face to face in clinical consultation/evaluation, greater than 50% of which was counseling/coordinating care for L abd abscess drain  Signed: Dorianna Mckiver A 12/02/2013, 8:39 AM

## 2013-12-02 NOTE — Progress Notes (Signed)
[facility] Progress Note     Subjective: Still in pain, but improved.  No N/V, tolerating diet, but appetite low.  Requests to be back on home meds for constipation.  He says he hasn't gone to the bathroom in 4 days.    Objective: Vital signs in last 24 hours: Temp:  [98.6 F (37 C)-100.7 F (38.2 C)] 100.7 F (38.2 C) (11/07 0548) Pulse Rate:  [85-101] 85 (11/07 0548) Resp:  [13-24] 18 (11/06 1322) BP: (113-140)/(61-77) 124/75 mmHg (11/07 0548) SpO2:  [93 %-98 %] 93 % (11/07 0548) Last BM Date: 11/28/13  Intake/Output from previous day: 11/06 0701 - 11/07 0700 In: 637.5 [P.O.:120; IV Piggyback:517.5] Out: 1620 [Urine:1540; Drains:80] Intake/Output this shift:    PE: Gen:  Alert, NAD, pleasant Abd: Soft, mild tenderness, ND, indurated and erythematous around drain, but erythema is receding, +BS, no HSM, incision well healed, JP drain with sanguinous output (33mL/24hr)   Lab Results:   Recent Labs  11/30/13 1924 12/01/13 0619  WBC 14.5* 13.0*  HGB 10.8* 9.8*  HCT 32.7* 29.3*  PLT 377 321   BMET  Recent Labs  11/30/13 1924 12/01/13 0619  NA 134* 135*  K 4.1 4.1  CL 95* 99  CO2 24 23  GLUCOSE 116* 131*  BUN 15 12  CREATININE 0.83 0.78  CALCIUM 9.3 8.7   PT/INR  Recent Labs  12/01/13 0834  LABPROT 16.4*  INR 1.31   CMP     Component Value Date/Time   NA 135* 12/01/2013 0619   K 4.1 12/01/2013 0619   CL 99 12/01/2013 0619   CO2 23 12/01/2013 0619   GLUCOSE 131* 12/01/2013 0619   BUN 12 12/01/2013 0619   CREATININE 0.78 12/01/2013 0619   CREATININE 1.01 06/20/2013 1408   CALCIUM 8.7 12/01/2013 0619   PROT 8.7* 11/30/2013 1924   ALBUMIN 2.9* 11/30/2013 1924   AST 24 11/30/2013 1924   ALT 18 11/30/2013 1924   ALKPHOS 101 11/30/2013 1924   BILITOT 0.3 11/30/2013 1924   GFRNONAA 87* 12/01/2013 0619   GFRNONAA 73 06/20/2013 1408   GFRAA >90 12/01/2013 0619   GFRAA 85 06/20/2013 1408   Lipase     Component Value Date/Time    LIPASE 17 11/30/2013 1924       Studies/Results: [place] Abscess Drain  12/01/2013   CLINICAL DATA:  73 year old male with a history of anterior abdominal wall hernia repair. He has developed abdominal wall fluid collections suspicious for abscess.  EXAM: ULTRASOUND GUIDED DRAINAGE OF ABDOMINAL WALL ABSCESS  MEDICATIONS: 1.0 mg IV Versed; 50 mcg IV Fentanyl  Total Moderate Sedation Time: 18 MIN  PROCEDURE: The procedure, risks, benefits, and alternatives were explained to the patient. Questions regarding the procedure were encouraged and answered. The patient understands and consents to the procedure.  Ultrasound survey of the left abdominal wall was performed for planning purposes. Image were stored and sent to PACs.  The left anterior abdominal wall was prepped with Betadine in a sterile fashion, and a sterile drape was applied covering the operative field. A sterile gown and sterile gloves were used for the procedure. Local anesthesia was provided with 1% Lidocaine.  After infiltration of the skin and subcutaneous tissues a small stab incision was made with 11 blade scalpel. A 10 French pigtail drain was placed into the fluid collection with a trocar technique under ultrasound guidance. Once a drain was not placed approximately 80 cc of purulent fluid was removed.  The drain was sutured in position.  The drain was attached to bulb suction.  Final images were stored.  The patient tolerated the procedure well and remained hemodynamically stable throughout.  No complications were encountered and no significant blood loss was encounter.  COMPLICATIONS: None.  FINDINGS: Fluid collection on the anterior abdominal wall.  80 cc of purulent fluid was aspirated.  Sample was sent to the lab.  IMPRESSION: Status post ultrasound-guided drainage of left anterior abdominal wall abscess with 80 cc of purulent fluid aspirated. Sample was sent to the lab.  Signed,  Dulcy Fanny. Earleen Newport, DO  [facility]  [facility]   Electronically Signed   By: Corrie Mckusick D.O.   On: 12/01/2013 12:44   Ct Abdomen Pelvis W Contrast  11/30/2013   CLINICAL DATA:  Generalized abdominal pain and leukocytosis. Symptoms have been present since hernia repair September 2015.  EXAM: CT ABDOMEN AND PELVIS WITH CONTRAST  TECHNIQUE: Multidetector CT imaging of the abdomen and pelvis was performed using the standard protocol following bolus administration of intravenous contrast.  CONTRAST:  177mL OMNIPAQUE IOHEXOL 300 MG/ML  SOLN  COMPARISON:  08/22/2008  FINDINGS: BODY WALL: There is a ventral abdominal wall hernia repair using mesh. The upper margin of the mesh is difficult to discern, but it is at least contiguous with an irregularly shaped fluid collection in the anterior peritoneal or extraperitoneal intra-abdominal space. This peripherally enhancing collection measures at 9 cm in craniocaudal span by up to 14 cm in transverse span by 1.3 cm in maximal thickness. The collection decompresses into the subcutaneous space through defects in the bilateral anterior abdominal wall. The largest pocket of the subcutaneous fluid collection measures approximately 7 cm in diameter by 3 cm in thickness. No subcutaneous gas or evidence of fistula.  LOWER CHEST: Unremarkable.  ABDOMEN/PELVIS:  Liver: No focal abnormality.  Biliary: Cholelithiasis. No evidence for biliary obstruction or perforation.  Pancreas: Unremarkable.  Spleen: Unremarkable.  Adrenals: Unremarkable.  Kidneys and ureters: No hydronephrosis or stone. 2 cm exophytic cyst from the left lower pole.  Bladder: Unremarkable.  Reproductive: Unremarkable.  Bowel: No obstruction. Negative appendix. Status post sigmoidectomy.  Retroperitoneum: No mass or adenopathy.  Peritoneum: No ascites or pneumoperitoneum.  Vascular: No acute abnormality.  OSSEOUS: Bilateral L5 pars defects with grade 1-2 anterolisthesis.  IMPRESSION: Extensive fluid collection, likely abscess,  both superficial and deep to the anterior abdominal wall. The collection is contiguous with mesh used for hernia repair.   Electronically Signed   By: Jorje Guild M.D.   On: 11/30/2013 21:31    Anti-infectives: Anti-infectives    Start     Dose/Rate Route Frequency Ordered Stop   12/02/13 0000  gentamicin (GARAMYCIN) 550 mg in dextrose 5 % 100 mL IVPB     7 mg/kg  78.6 kg (Adjusted)113.8 mL/hr over 60 Minutes Intravenous Every 24 hours 12/01/13 1104     12/01/13 1600  gentamicin (GARAMYCIN) 130 mg in dextrose 5 % 50 mL IVPB  Status:  Discontinued     130 mg106.5 mL/hr over 30 Minutes Intravenous Every 8 hours 12/01/13 0730 12/01/13 1059   12/01/13 0745  gentamicin (GARAMYCIN) 150 mg in dextrose 5 % 50 mL IVPB     150 mg107.5 mL/hr over 30 Minutes Intravenous  Once 12/01/13 0730 12/01/13 0926   12/01/13 0100  vancomycin (VANCOCIN) IVPB 1000 mg/200 mL premix     1,000 mg200 mL/hr over 60 Minutes Intravenous Every 12 hours 12/01/13 0051     12/01/13 0045  piperacillin-tazobactam (ZOSYN) IVPB  3.375 g     3.375 g12.5 mL/hr over 240 Minutes Intravenous 3 times per day 12/01/13 0043     12/01/13 0000  doxycycline (VIBRA-TABS) 100 MG tablet     100 mg Oral 2 times daily 12/01/13 1225         Assessment/Plan Cellulitis and fluid collection and abdominal wall suspicious for infected seroma.   Plan: -s/p IR percutaneous drainage -Continue IV antibiotics. Fluid off for culture but pending -When cellulitis resolves, transition to oral antibiotics with outpatient follow-up (sunday if improved). Doxycycline x 2 weeks written -Try and hold off on removal of mesh as long as infection controlled since seroma is in some continuous tissues and mesh is intraperitoneal -Resumed home constipation meds and added MOM -VTE prophylaxis- SCDs, etc -Mobilize as tolerated to help recovery    LOS: 2 days    DORT, Tomeca Helm 12/02/2013, 9:22 AM Pager: [phone]

## 2013-12-03 LAB — BASIC METABOLIC PANEL
ANION GAP: 13 (ref 5–15)
BUN: 8 mg/dL (ref 6–23)
CHLORIDE: 96 meq/L (ref 96–112)
CO2: 26 meq/L (ref 19–32)
Calcium: 9 mg/dL (ref 8.4–10.5)
Creatinine, Ser: 1.04 mg/dL (ref 0.50–1.35)
GFR calc Af Amer: 80 mL/min — ABNORMAL LOW (ref >90)
GFR calc non Af Amer: 69 mL/min — ABNORMAL LOW (ref >90)
Glucose, Bld: 131 mg/dL — ABNORMAL HIGH (ref 70–99)
Potassium: 3.8 mEq/L (ref 3.7–5.3)
Sodium: 135 mEq/L — ABNORMAL LOW (ref 137–147)

## 2013-12-03 LAB — CBC
HEMATOCRIT: 31.9 % — AB (ref 39.0–52.0)
HEMOGLOBIN: 10.7 g/dL — AB (ref 13.0–17.0)
MCH: 29.1 pg (ref 26.0–34.0)
MCHC: 33.5 g/dL (ref 30.0–36.0)
MCV: 86.7 fL (ref 78.0–100.0)
Platelets: 373 10*3/uL (ref 150–400)
RBC: 3.68 MIL/uL — AB (ref 4.22–5.81)
RDW: 13.9 % (ref 11.5–15.5)
WBC: 12.3 10*3/uL — AB (ref 4.0–10.5)

## 2013-12-03 LAB — VANCOMYCIN, TROUGH: Vancomycin Tr: 13.1 ug/mL (ref 10.0–20.0)

## 2013-12-03 MED ORDER — SODIUM CHLORIDE 0.9 % IJ SOLN: 3.0000 mL | INTRAMUSCULAR | Status: DC | PRN

## 2013-12-03 MED ORDER — SODIUM CHLORIDE 0.9 % IV SOLN: 250.0000 mL | INTRAVENOUS | Status: DC | PRN

## 2013-12-03 MED ORDER — SODIUM CHLORIDE 0.9 % IJ SOLN
3.0000 mL | Freq: Two times a day (BID) | INTRAMUSCULAR | Status: DC
  Administered 2013-12-03 – 2013-12-05 (×3): 3 mL via INTRAVENOUS

## 2013-12-03 NOTE — Progress Notes (Signed)
Referring Physician(s): CCS  Subjective:  abd wall abscess drain LLQ- placed 11/6 Draining well- serous/bloody fluid Flushing well Pt developed output from previous surgical drain site in lower L abd last pm Output is brownish thin fluid   Allergies: Review of patient's allergies indicates no known allergies.  Medications: Prior to Admission medications   Medication Sig Start Date End Date Taking? Authorizing Provider  acetaminophen (TYLENOL) 500 MG tablet Take 100 mg by mouth every 8 (eight) hours as needed for fever.   Yes Historical Provider, MD  Ascorbic Acid (VITAMIN C) 1000 MG tablet Take 1,000 mg by mouth daily.     Yes Historical Provider, MD  aspirin 81 MG tablet Take 81 mg by mouth 2 (two) times daily.    Yes Historical Provider, MD  Biotin 10 MG CAPS Take 10 mg by mouth daily.    Yes Historical Provider, MD  Calcium Carbonate-Vitamin D (CALCIUM + D PO) Take 1 tablet by mouth daily.    Yes Historical Provider, MD  Cholecalciferol (VITAMIN D) 2000 UNITS CAPS Take 2,000 Units by mouth daily.    Yes Historical Provider, MD  fexofenadine-pseudoephedrine (ALLEGRA-D 24) 180-240 MG per 24 hr tablet Take 1 tablet by mouth daily.   Yes Historical Provider, MD  Glucosamine 500 MG CAPS Take 500 mg by mouth daily.    Yes Historical Provider, MD  methocarbamol (ROBAXIN) 750 MG tablet Take 1 tablet (750 mg total) by mouth 4 (four) times daily as needed (use for muscle cramps/pain). 10/13/13  Yes Michael Boston, MD  Methylcellulose, Laxative, (CITRUCEL PO) Take 2 tablets by mouth 2 (two) times daily.    Yes Historical Provider, MD  Multiple Vitamins-Minerals (MULTIVITAMIN PO) Take 1 tablet by mouth daily.    Yes Historical Provider, MD  Omega-3 Fatty Acids (FISH OIL PO) Take 900 mg by mouth daily. Taking 900 daily   Yes Historical Provider, MD  oxyCODONE (OXY IR/ROXICODONE) 5 MG immediate release tablet Take 1-2 tablets (5-10 mg total) by mouth every 4 (four) hours as needed for moderate  pain, severe pain or breakthrough pain. 10/13/13  Yes Michael Boston, MD  Red Yeast Rice 600 MG CAPS Take 1,200 mg by mouth daily.    Yes Historical Provider, MD  SALINE NASAL SPRAY NA Place 1 spray into the nose daily as needed (allergies).    Yes Historical Provider, MD  SYNTHROID 137 MCG tablet Take 1 tablet (137 mcg total) by mouth daily before breakfast. 12/28/12  Yes Elayne Snare, MD  vitamin B-12 (CYANOCOBALAMIN) 1000 MCG tablet Take 1,000 mcg by mouth daily.   Yes Historical Provider, MD  doxycycline (VIBRA-TABS) 100 MG tablet Take 1 tablet (100 mg total) by mouth 2 (two) times daily. 12/01/13   Michael Boston, MD  loratadine (CLARITIN) 10 MG tablet Take 10 mg by mouth daily as needed for allergies. As needed    Historical Provider, MD    Review of Systems  Vital Signs: BP 144/67 mmHg  Pulse 90  Temp(Src) 99.5 F (37.5 C) (Oral)  Resp 16  Ht 5\' 11"  (1.803 m)  Wt 83.462 kg (184 lb)  BMI 25.67 kg/m2  SpO2 92%  Physical Exam  Abdominal:  Tmax 100.7; now 99.5 Output of abd wall- LLQ IR drain serous/bloody 25 cc yesterday; 30 cc in JP now Site clean and dry; NT Cx: abundant wbc  New development of output from previous surgical drain site Lower L abd Output thin brown color    Imaging: [place] Abscess Drain  12/01/2013  CLINICAL DATA:  73 year old male with a history of anterior abdominal wall hernia repair. He has developed abdominal wall fluid collections suspicious for abscess.  EXAM: ULTRASOUND GUIDED DRAINAGE OF ABDOMINAL WALL ABSCESS  MEDICATIONS: 1.0 mg IV Versed; 50 mcg IV Fentanyl  Total Moderate Sedation Time: 18 MIN  PROCEDURE: The procedure, risks, benefits, and alternatives were explained to the patient. Questions regarding the procedure were encouraged and answered. The patient understands and consents to the procedure.  Ultrasound survey of the left abdominal wall was performed for planning purposes. Image were stored and sent to PACs.  The left anterior abdominal wall was  prepped with Betadine in a sterile fashion, and a sterile drape was applied covering the operative field. A sterile gown and sterile gloves were used for the procedure. Local anesthesia was provided with 1% Lidocaine.  After infiltration of the skin and subcutaneous tissues a small stab incision was made with 11 blade scalpel. A 10 French pigtail drain was placed into the fluid collection with a trocar technique under ultrasound guidance. Once a drain was not placed approximately 80 cc of purulent fluid was removed.  The drain was sutured in position.  The drain was attached to bulb suction.  Final images were stored.  The patient tolerated the procedure well and remained hemodynamically stable throughout.  No complications were encountered and no significant blood loss was encounter.  COMPLICATIONS: None.  FINDINGS: Fluid collection on the anterior abdominal wall.  80 cc of purulent fluid was aspirated.  Sample was sent to the lab.  IMPRESSION: Status post ultrasound-guided drainage of left anterior abdominal wall abscess with 80 cc of purulent fluid aspirated. Sample was sent to the lab.  Signed,  Dulcy Fanny. Earleen Newport, DO  [facility]  [facility]   Electronically Signed   By: Corrie Mckusick D.O.   On: 12/01/2013 12:44   Ct Abdomen Pelvis W Contrast  11/30/2013   CLINICAL DATA:  Generalized abdominal pain and leukocytosis. Symptoms have been present since hernia repair September 2015.  EXAM: CT ABDOMEN AND PELVIS WITH CONTRAST  TECHNIQUE: Multidetector CT imaging of the abdomen and pelvis was performed using the standard protocol following bolus administration of intravenous contrast.  CONTRAST:  183mL OMNIPAQUE IOHEXOL 300 MG/ML  SOLN  COMPARISON:  08/22/2008  FINDINGS: BODY WALL: There is a ventral abdominal wall hernia repair using mesh. The upper margin of the mesh is difficult to discern, but it is at least contiguous with an irregularly shaped fluid collection in  the anterior peritoneal or extraperitoneal intra-abdominal space. This peripherally enhancing collection measures at 9 cm in craniocaudal span by up to 14 cm in transverse span by 1.3 cm in maximal thickness. The collection decompresses into the subcutaneous space through defects in the bilateral anterior abdominal wall. The largest pocket of the subcutaneous fluid collection measures approximately 7 cm in diameter by 3 cm in thickness. No subcutaneous gas or evidence of fistula.  LOWER CHEST: Unremarkable.  ABDOMEN/PELVIS:  Liver: No focal abnormality.  Biliary: Cholelithiasis. No evidence for biliary obstruction or perforation.  Pancreas: Unremarkable.  Spleen: Unremarkable.  Adrenals: Unremarkable.  Kidneys and ureters: No hydronephrosis or stone. 2 cm exophytic cyst from the left lower pole.  Bladder: Unremarkable.  Reproductive: Unremarkable.  Bowel: No obstruction. Negative appendix. Status post sigmoidectomy.  Retroperitoneum: No mass or adenopathy.  Peritoneum: No ascites or pneumoperitoneum.  Vascular: No acute abnormality.  OSSEOUS: Bilateral L5 pars defects with grade 1-2 anterolisthesis.  IMPRESSION: Extensive fluid collection, likely abscess,  both superficial and deep to the anterior abdominal wall. The collection is contiguous with mesh used for hernia repair.   Electronically Signed   By: Jorje Guild M.D.   On: 11/30/2013 21:31    Labs:  CBC:  Recent Labs  10/06/13 0942 10/15/13 1155 11/30/13 1806 11/30/13 1924 12/01/13 0619  WBC 4.3 8.0 15.8* 14.5* 13.0*  HGB 13.4 12.3* 11.0* 10.8* 9.8*  HCT 40.5 36.3* 34.4* 32.7* 29.3*  PLT 249 238  --  377 321    COAGS:  Recent Labs  12/01/13 0834  INR 1.31  APTT 38*    BMP:  Recent Labs  10/06/13 0942 10/15/13 1155 11/30/13 1924 12/01/13 0619  NA 141 136* 134* 135*  K 3.9 4.2 4.1 4.1  CL 104 97 95* 99  CO2 26 29 24 23   GLUCOSE 90 89 116* 131*  BUN 14 11 15 12   CALCIUM 9.6 9.3 9.3 8.7  CREATININE 0.85 0.79 0.83 0.78    GFRNONAA 84* 87* 85* 87*  GFRAA >90 >90 >90 >90    LIVER FUNCTION TESTS:  Recent Labs  06/20/13 1408 11/30/13 1924  BILITOT 0.8 0.3  AST 24 24  ALT 14 18  ALKPHOS 56 101  PROT 7.4 8.7*  ALBUMIN 3.9 2.9*    Assessment and Plan:  L abd wall abscess IR drain doing well Will follow RN to inform surgeon of new output from prev surgical drain site   I spent a total of 15 minutes face to face in clinical consultation/evaluation, greater than 50% of which was counseling/coordinating care for L abd wall abscess drain  Signed: Sianna Garofano A 12/03/2013, 8:55 AM

## 2013-12-03 NOTE — Progress Notes (Signed)
ANTIBIOTIC CONSULT NOTE - FOLLOW UP  Pharmacy Consult for Vanc and Gentamicin Indication: cellulitis  No Known Allergies  Patient Measurements: Height: 5\' 11"  (180.3 cm) Weight: 184 lb (83.462 kg) IBW/kg (Calculated) : 75.3   Vital Signs: Temp: 99.5 F (37.5 C) (11/08 0540) Temp Source: Oral (11/08 0540) BP: 144/67 mmHg (11/08 0540) Pulse Rate: 90 (11/08 0540) Intake/Output from previous day: 11/07 0701 - 11/08 0700 In: 720 [P.O.:720] Out: 1325 [Urine:1300; Drains:25] Intake/Output from this shift:    Labs:  Recent Labs  11/30/13 1924 12/01/13 0619 12/03/13 0920 12/03/13 1240  WBC 14.5* 13.0*  --  12.3*  HGB 10.8* 9.8*  --  10.7*  PLT 377 321  --  373  CREATININE 0.83 0.78 1.04  --    Estimated Creatinine Clearance: 67.4 mL/min (by C-G formula based on Cr of 1.04).  Recent Labs  12/02/13 1205 12/03/13 0920  VANCOTROUGH  --  13.1  GENTRANDOM 3.4  --      Microbiology: Recent Results (from the past 720 hour(s))  Culture, routine-abscess     Status: None (Preliminary result)   Collection Time: 12/01/13 12:29 PM  Result Value Ref Range Status   Specimen Description ABSCESS ABDOMEN  Final   Special Requests Normal  Final   Gram Stain   Final    ABUNDANT WBC PRESENT,BOTH PMN AND MONONUCLEAR NO SQUAMOUS EPITHELIAL CELLS SEEN FEW GRAM POSITIVE COCCI IN PAIRS IN CLUSTERS Performed at [facility]    Culture   Final    Culture reincubated for better growth Performed at [facility]    Report Status PENDING  Incomplete  Anaerobic culture     Status: None (Preliminary result)   Collection Time: 12/01/13 12:29 PM  Result Value Ref Range Status   Specimen Description ABSCESS ABDOMEN  Final   Special Requests Normal  Final   Gram Stain   Final    ABUNDANT WBC PRESENT,BOTH PMN AND MONONUCLEAR NO SQUAMOUS EPITHELIAL CELLS SEEN FEW GRAM POSITIVE COCCI IN PAIRS IN CLUSTERS Performed at [facility]    Culture   Final    NO  ANAEROBES ISOLATED; CULTURE IN PROGRESS FOR 5 DAYS Performed at [facility]    Report Status PENDING  Incomplete    Anti-infectives    Start     Dose/Rate Route Frequency Ordered Stop   12/02/13 0000  gentamicin (GARAMYCIN) 550 mg in dextrose 5 % 100 mL IVPB     7 mg/kg  78.6 kg (Adjusted)113.8 mL/hr over 60 Minutes Intravenous Every 24 hours 12/01/13 1104     12/01/13 1600  gentamicin (GARAMYCIN) 130 mg in dextrose 5 % 50 mL IVPB  Status:  Discontinued     130 mg106.5 mL/hr over 30 Minutes Intravenous Every 8 hours 12/01/13 0730 12/01/13 1059   12/01/13 0745  gentamicin (GARAMYCIN) 150 mg in dextrose 5 % 50 mL IVPB     150 mg107.5 mL/hr over 30 Minutes Intravenous  Once 12/01/13 0730 12/01/13 0926   12/01/13 0100  vancomycin (VANCOCIN) IVPB 1000 mg/200 mL premix     1,000 mg200 mL/hr over 60 Minutes Intravenous Every 12 hours 12/01/13 0051     12/01/13 0045  piperacillin-tazobactam (ZOSYN) IVPB 3.375 g     3.375 g12.5 mL/hr over 240 Minutes Intravenous 3 times per day 12/01/13 0043     12/01/13 0000  doxycycline (VIBRA-TABS) 100 MG tablet     100 mg Oral 2 times daily 12/01/13 1225        Assessment: Preston  Reeves had abdominal surgery for hernia in Sept, c/o abdominal discomfort since then, 3d ago pt noticed a "knot" to left lower abdomen w/ erythema and warmth, also reports fever to 102, WBC at PCP was 15.8, instructed to go to ED, now admitted for surgical eval, to begin IV ABX for cellulitis w/ possible abscess.  WBC trending down, 12.3 today; pt afebrile SCr 0.83>0.78>1.04 - continue to monitor  11.5hr gent level 3.4 - continue w/ Q24hr dosing VT 13.1 - therapeutic  Goal of Therapy:  Vanc trough level 10-15 mcg/mL Resolution of infection, clinical improvement  Plan:  Continue vanc 1000mg  Q12hr Continue gent 550mg  Q24hr Monitor SCr, clinical improvement When cellulitis resolves, transition to PO doxy x 2wk (per MD note)   Drucie Opitz, PharmD Clinical  Pharmacy Resident Pager: [phone] 12/03/2013 2:21 PM

## 2013-12-03 NOTE — Progress Notes (Signed)
At [phone] patient called RN into the room. Patient had pus-like and bloody discharge from a new opening in his abdomen approximately 2 inches beneath where the JP drain is at. Discharge was pouring from the small opening. Patient is concerned. RN and NT cleaned the patient. RN placed ABD dressing over the new abdominal opening. RN also reinforced the dressing on the JP drain. RN notified MD. Nursing will continue to monitor.

## 2013-12-03 NOTE — Progress Notes (Signed)
[facility] Progress Note     Subjective: Nurse says his previous JP drain site is now draining frank yellow thick pus.  His JP drain is also draining pus.  Probably about 36mL/24 hour.  Pt's pain improved.  Had a BM yesterday and feels less distended.  His wife thinks his redness is a little worse more towards his midline.  Overall he's tolerating his diet better today and ambulating well.    Objective: Vital signs in last 24 hours: Temp:  [99.5 F (37.5 C)-100.4 F (38 C)] 99.5 F (37.5 C) (11/08 0540) Pulse Rate:  [90-100] 90 (11/08 0540) Resp:  [16-18] 16 (11/08 0540) BP: (125-151)/(59-76) 144/67 mmHg (11/08 0540) SpO2:  [92 %-96 %] 92 % (11/08 0540) Last BM Date: 12/02/13  Intake/Output from previous day: 11/07 0701 - 11/08 0700 In: 720 [P.O.:720] Out: 1325 [Urine:1300; Drains:25] Intake/Output this shift:    PE: Gen: Alert, NAD, pleasant Abd: Soft, mild tenderness, ND, indurated and erythematous around drain, erythema has spread mildly to the midline compared to yesterday, +BS, no HSM, incision well healed, JP drain with sanguinous-purulent yellow output (75mL/24hr) but a lot draining from his skin at the previous JP drain site   Lab Results:   Recent Labs  11/30/13 1924 12/01/13 0619  WBC 14.5* 13.0*  HGB 10.8* 9.8*  HCT 32.7* 29.3*  PLT 377 321   BMET  Recent Labs  11/30/13 1924 12/01/13 0619  NA 134* 135*  K 4.1 4.1  CL 95* 99  CO2 24 23  GLUCOSE 116* 131*  BUN 15 12  CREATININE 0.83 0.78  CALCIUM 9.3 8.7   PT/INR  Recent Labs  12/01/13 0834  LABPROT 16.4*  INR 1.31   CMP     Component Value Date/Time   NA 135* 12/01/2013 0619   K 4.1 12/01/2013 0619   CL 99 12/01/2013 0619   CO2 23 12/01/2013 0619   GLUCOSE 131* 12/01/2013 0619   BUN 12 12/01/2013 0619   CREATININE 0.78 12/01/2013 0619   CREATININE 1.01 06/20/2013 1408   CALCIUM 8.7 12/01/2013 0619   PROT 8.7* 11/30/2013 1924   ALBUMIN 2.9* 11/30/2013 1924   AST 24  11/30/2013 1924   ALT 18 11/30/2013 1924   ALKPHOS 101 11/30/2013 1924   BILITOT 0.3 11/30/2013 1924   GFRNONAA 87* 12/01/2013 0619   GFRNONAA 73 06/20/2013 1408   GFRAA >90 12/01/2013 0619   GFRAA 85 06/20/2013 1408   Lipase     Component Value Date/Time   LIPASE 17 11/30/2013 1924       Studies/Results: [place] Abscess Drain  12/01/2013   CLINICAL DATA:  73 year old male with a history of anterior abdominal wall hernia repair. He has developed abdominal wall fluid collections suspicious for abscess.  EXAM: ULTRASOUND GUIDED DRAINAGE OF ABDOMINAL WALL ABSCESS  MEDICATIONS: 1.0 mg IV Versed; 50 mcg IV Fentanyl  Total Moderate Sedation Time: 18 MIN  PROCEDURE: The procedure, risks, benefits, and alternatives were explained to the patient. Questions regarding the procedure were encouraged and answered. The patient understands and consents to the procedure.  Ultrasound survey of the left abdominal wall was performed for planning purposes. Image were stored and sent to PACs.  The left anterior abdominal wall was prepped with Betadine in a sterile fashion, and a sterile drape was applied covering the operative field. A sterile gown and sterile gloves were used for the procedure. Local anesthesia was provided with 1% Lidocaine.  After infiltration of the skin and subcutaneous tissues a small  stab incision was made with 11 blade scalpel. A 10 French pigtail drain was placed into the fluid collection with a trocar technique under ultrasound guidance. Once a drain was not placed approximately 80 cc of purulent fluid was removed.  The drain was sutured in position.  The drain was attached to bulb suction.  Final images were stored.  The patient tolerated the procedure well and remained hemodynamically stable throughout.  No complications were encountered and no significant blood loss was encounter.  COMPLICATIONS: None.  FINDINGS: Fluid collection on the anterior abdominal wall.  80 cc of purulent fluid was  aspirated.  Sample was sent to the lab.  IMPRESSION: Status post ultrasound-guided drainage of left anterior abdominal wall abscess with 80 cc of purulent fluid aspirated. Sample was sent to the lab.  Signed,  Dulcy Fanny. Earleen Newport, DO  [facility]  [facility]   Electronically Signed   By: Corrie Mckusick D.O.   On: 12/01/2013 12:44    Anti-infectives: Anti-infectives    Start     Dose/Rate Route Frequency Ordered Stop   12/02/13 0000  gentamicin (GARAMYCIN) 550 mg in dextrose 5 % 100 mL IVPB     7 mg/kg  78.6 kg (Adjusted)113.8 mL/hr over 60 Minutes Intravenous Every 24 hours 12/01/13 1104     12/01/13 1600  gentamicin (GARAMYCIN) 130 mg in dextrose 5 % 50 mL IVPB  Status:  Discontinued     130 mg106.5 mL/hr over 30 Minutes Intravenous Every 8 hours 12/01/13 0730 12/01/13 1059   12/01/13 0745  gentamicin (GARAMYCIN) 150 mg in dextrose 5 % 50 mL IVPB     150 mg107.5 mL/hr over 30 Minutes Intravenous  Once 12/01/13 0730 12/01/13 0926   12/01/13 0100  vancomycin (VANCOCIN) IVPB 1000 mg/200 mL premix     1,000 mg200 mL/hr over 60 Minutes Intravenous Every 12 hours 12/01/13 0051     12/01/13 0045  piperacillin-tazobactam (ZOSYN) IVPB 3.375 g     3.375 g12.5 mL/hr over 240 Minutes Intravenous 3 times per day 12/01/13 0043     12/01/13 0000  doxycycline (VIBRA-TABS) 100 MG tablet     100 mg Oral 2 times daily 12/01/13 1225         Assessment/Plan Cellulitis and fluid collection and abdominal wall suspicious for infected seroma.  Plan: -s/p IR percutaneous drainage on 12/01/13 -Continue IV antibiotics. Fluid off for culture but pending. -When cellulitis resolves, transition to oral antibiotics with outpatient follow-up. Doxycycline x 2 weeks written. -Try and hold off on removal of mesh as long as infection controlled since seroma is in some continuous tissues and mesh is intraperitoneal -Resumed home constipation meds and he has seen improvement in  this -VTE prophylaxis- SCDs, etc -Mobilize as tolerated to help recovery -Now has purulent drainage from old JP drain site which is draining purulent drainage, new cultures sent, continue dressing changes over this BID and more often as needed.    LOS: 3 days    Coralie Keens 12/03/2013, 9:24 AM Pager: [phone]

## 2013-12-04 LAB — BASIC METABOLIC PANEL
ANION GAP: 11 (ref 5–15)
BUN: 10 mg/dL (ref 6–23)
CHLORIDE: 98 meq/L (ref 96–112)
CO2: 28 mEq/L (ref 19–32)
CREATININE: 1.06 mg/dL (ref 0.50–1.35)
Calcium: 9.2 mg/dL (ref 8.4–10.5)
GFR calc Af Amer: 78 mL/min — ABNORMAL LOW (ref >90)
GFR calc non Af Amer: 68 mL/min — ABNORMAL LOW (ref >90)
GLUCOSE: 105 mg/dL — AB (ref 70–99)
Potassium: 3.9 mEq/L (ref 3.7–5.3)
Sodium: 137 mEq/L (ref 137–147)

## 2013-12-04 LAB — CULTURE, ROUTINE-ABSCESS: SPECIAL REQUESTS: NORMAL

## 2013-12-04 MED ORDER — BISACODYL 10 MG RE SUPP: 10.0000 mg | Freq: Two times a day (BID) | RECTAL | Status: DC | PRN

## 2013-12-04 MED ORDER — PSYLLIUM 95 % PO PACK
1.0000 | PACK | Freq: Two times a day (BID) | ORAL | Status: DC
  Administered 2013-12-04 – 2013-12-06 (×5): 1 via ORAL
  Filled 2013-12-04 (×6): qty 1

## 2013-12-04 MED ORDER — TAB-A-VITE/IRON PO TABS
1.0000 | ORAL_TABLET | Freq: Every day | ORAL | Status: DC
  Administered 2013-12-04 – 2013-12-05 (×2): 1 via ORAL
  Filled 2013-12-04 (×3): qty 1

## 2013-12-04 MED ORDER — POLYETHYLENE GLYCOL 3350 17 G PO PACK: 17.0000 g | PACK | Freq: Two times a day (BID) | ORAL | Status: DC | PRN

## 2013-12-04 MED ORDER — LACTATED RINGERS IV SOLN
INTRAVENOUS | Status: DC
  Administered 2013-12-05: 01:00:00 via INTRAVENOUS

## 2013-12-04 MED ORDER — CEFAZOLIN SODIUM-DEXTROSE 2-3 GM-% IV SOLR
2.0000 g | Freq: Three times a day (TID) | INTRAVENOUS | Status: DC
  Administered 2013-12-04 – 2013-12-06 (×6): 2 g via INTRAVENOUS
  Filled 2013-12-04 (×8): qty 50

## 2013-12-04 MED ORDER — VITAMIN C 500 MG PO TABS
500.0000 mg | ORAL_TABLET | Freq: Two times a day (BID) | ORAL | Status: DC
  Administered 2013-12-04 – 2013-12-05 (×4): 500 mg via ORAL
  Filled 2013-12-04 (×7): qty 1

## 2013-12-04 MED ORDER — SACCHAROMYCES BOULARDII 250 MG PO CAPS
250.0000 mg | ORAL_CAPSULE | Freq: Two times a day (BID) | ORAL | Status: DC
  Administered 2013-12-04 – 2013-12-06 (×5): 250 mg via ORAL
  Filled 2013-12-04 (×6): qty 1

## 2013-12-04 NOTE — Progress Notes (Signed)
RN assessed patients dressing over the JP drain and dressing over open tunnel located at the left lower abdomen. Both dressings were clean, dry, and intact. There is no drainage coming from the tunnel. There is moderate clear bloody drainage coming from the JP drain. RN flushed JP drain with 10 cc's of normal saline. RN will continue to monitor.

## 2013-12-04 NOTE — Progress Notes (Addendum)
[address] [place], [place] [address] Phone: [phone] FAX: [facility] [phone] Dec 11, 1940  CARE TEAM:  PCP: Preston Reichmann, MD  Outpatient Care Team: Patient Care Team: Preston Russian, MD as PCP - General (Family Medicine) Preston Bring, MD as Consulting Physician (Urology) Preston Shipper, MD as Consulting Physician (Gastroenterology) Preston Coco, MD as Consulting Physician (Cardiology)  Inpatient Treatment Team: Treatment Team: Attending Provider: Michael Boston, MD; Technician: Preston Reeves, NT; Technician: Preston Reeves, NT (Inactive); Registered Nurse: Preston Krabbe, RN; Technician: Preston Reeves, NT; Technician: Preston Reeves, NT; Registered Nurse: Preston Herter, RN; Technician: Preston Reeves, NT; Registered Nurse: Preston Mirza, RN; Respiratory Therapist: Saunders Reeves, RRT   Subjective:  Wife, RN at bedside Purulent drainage noted at old LLQ drain site Overall feels better Abd pain down Eating better  Objective:  Vital signs:  Filed Vitals:   12/02/13 2128 12/03/13 0540 12/03/13 1759 12/03/13 2114  BP: 151/76 144/67 136/73 155/87  Pulse: 100 90 93 95  Temp: 100.4 F (38 C) 99.5 F (37.5 C) 97.5 F (36.4 C) 97.5 F (36.4 C)  TempSrc: Oral Oral Oral Oral  Resp: _0 Height:      Weight:      SpO2: 93% 92% 96% 95%    Last BM Date: 12/02/13  Intake/Output   Yesterday:  11/08 0701 - 11/09 0700 In: 55 [P.O.:720] Out: 45 [Urine:250; Drains:10] This shift:     Bowel function:  Flatus: y  BM: y  Drain: Thin seropurulent  Physical Exam:  General: Pt awake/alert/oriented x4 in no major acute distress Eyes: PERRL, normal EOM. Sclera nonicteric Neuro: CN II-XII intact w/o focal sensory/motor deficits. Lymph: No head/neck/groin lymphadenopathy Psych:  No delerium/psychosis/paranoia HENT: Normocephalic, Mucus membranes moist.  No  thrush Neck: Supple, No tracheal deviation Chest: No pain.  Good respiratory excursion. CV:  Pulses intact.  Regular rhythm MS: Normal AROM mjr joints.  No obvious deformity  Abdomen: Soft, Nondistended.  Nontender.  No incarcerated hernias.  Erythema resolved but edema at old colostomy incision - for fluctuance.  R abd soft/flat.  LUQ softer flat.  Erythema gone.  LLQ old drain site with purulent drainage.  I&D at bedside released 79m pus.  Probed 4cm in with discrete pocket - packed.  Ext:  SCDs BLE.  No significant edema.  No cyanosis Skin: No petechiae / purpura    Problem List:   Active Problems:   Infected postoperative seroma   Assessment  JNorval Reeves 73y.o. male       Cellulitis and abscesses x 2 in abdominal wall SQ - improved in some areas but still very concerning  Plan:  Percutaneous drainage controlling LUQ upper abscess well.  Hopefully open drainage LLQ lower collection enough.  If not better by tomorrow, plan OR exploration with SQ washout.  Other option is repeat CT scan but would not change my plan of OR exploration if not better. Try and hold off on removal of mesh as long as infection controlled since seroma is in subcutaneous tissues and mesh is intraperitoneal.  Entire mesh removal would be plan C  Continue IV antibiotics. GPC = prob Staph.  When cellulitis resolves, transition to oral antibiotics with outpatient follow-up.  Doxycycline x 2 weeks written but may need 6 wk IV ABx w PICC - we will see  VTE prophylaxis- SCDs, etc  Mobilize as  tolerated to help recovery  Preston Reeves, M.D., F.A.C.S. Gastrointestinal and Minimally Invasive Surgery [facility] [address] [place] [address] [phone] Main / Paging   12/04/2013   Results:   Labs: Results for orders placed or performed during the hospital encounter of 11/30/13 (from the past 48 hour(s))  Gentamicin level, random     Status: None    Collection Time: 12/02/13 12:05 PM  Result Value Ref Range   Gentamicin Rm 3.4 ug/mL    Comment:        Random Gentamicin therapeutic range is dependent on dosage and time of specimen collection. A peak range is 5.0-10.0 ug/mL A trough range is 0.5-2.0 ug/mL        Performed at [facility] metabolic panel     Status: Abnormal   Collection Time: 12/03/13  9:20 AM  Result Value Ref Range   Sodium 135 (L) 137 - 147 mEq/L   Potassium 3.8 3.7 - 5.3 mEq/L   Chloride 96 96 - 112 mEq/L   CO2 26 19 - 32 mEq/L   Glucose, Bld 131 (H) 70 - 99 mg/dL   BUN 8 6 - 23 mg/dL   Creatinine, Ser 1.04 0.50 - 1.35 mg/dL   Calcium 9.0 8.4 - 10.5 mg/dL   GFR calc non Af Amer 69 (L) >90 mL/min   GFR calc Af Amer 80 (L) >90 mL/min    Comment: (NOTE) The eGFR has been calculated using the CKD EPI equation. This calculation has not been validated in all clinical situations. eGFR's persistently <90 mL/min signify possible Chronic Kidney Disease.    Anion gap 13 5 - 15  Vancomycin, trough     Status: None   Collection Time: 12/03/13  9:20 AM  Result Value Ref Range   Vancomycin Tr 13.1 10.0 - 20.0 ug/mL  CBC     Status: Abnormal   Collection Time: 12/03/13 12:40 PM  Result Value Ref Range   WBC 12.3 (H) 4.0 - 10.5 K/uL   RBC 3.68 (L) 4.22 - 5.81 MIL/uL   Hemoglobin 10.7 (L) 13.0 - 17.0 g/dL   HCT 31.9 (L) 39.0 - 52.0 %   MCV 86.7 78.0 - 100.0 fL   MCH 29.1 26.0 - 34.0 pg   MCHC 33.5 30.0 - 36.0 g/dL   RDW 13.9 11.5 - 15.5 %   Platelets 373 150 - 400 K/uL  Basic metabolic panel     Status: Abnormal   Collection Time: 12/04/13  5:04 AM  Result Value Ref Range   Sodium 137 137 - 147 mEq/L   Potassium 3.9 3.7 - 5.3 mEq/L   Chloride 98 96 - 112 mEq/L   CO2 28 19 - 32 mEq/L   Glucose, Bld 105 (H) 70 - 99 mg/dL   BUN 10 6 - 23 mg/dL   Creatinine, Ser 1.06 0.50 - 1.35 mg/dL   Calcium 9.2 8.4 - 10.5 mg/dL   GFR calc non Af Amer 68 (L) >90 mL/min   GFR calc Af Amer 78  (L) >90 mL/min    Comment: (NOTE) The eGFR has been calculated using the CKD EPI equation. This calculation has not been validated in all clinical situations. eGFR's persistently <90 mL/min signify possible Chronic Kidney Disease.    Anion gap 11 5 - 15    Imaging / Studies: No results found.  Medications / Allergies: per chart  Antibiotics: Anti-infectives    Start  Dose/Rate Route Frequency Ordered Stop   12/02/13 0000  gentamicin (GARAMYCIN) 550 mg in dextrose 5 % 100 mL IVPB     7 mg/kg  78.6 kg (Adjusted)113.8 mL/hr over 60 Minutes Intravenous Every 24 hours 12/01/13 1104     12/01/13 1600  gentamicin (GARAMYCIN) 130 mg in dextrose 5 % 50 mL IVPB  Status:  Discontinued     130 mg106.5 mL/hr over 30 Minutes Intravenous Every 8 hours 12/01/13 0730 12/01/13 1059   12/01/13 0745  gentamicin (GARAMYCIN) 150 mg in dextrose 5 % 50 mL IVPB     150 mg107.5 mL/hr over 30 Minutes Intravenous  Once 12/01/13 0730 12/01/13 0926   12/01/13 0100  vancomycin (VANCOCIN) IVPB 1000 mg/200 mL premix     1,000 mg200 mL/hr over 60 Minutes Intravenous Every 12 hours 12/01/13 0051     12/01/13 0045  piperacillin-tazobactam (ZOSYN) IVPB 3.375 g     3.375 g12.5 mL/hr over 240 Minutes Intravenous 3 times per day 12/01/13 0043     12/01/13 0000  doxycycline (VIBRA-TABS) 100 MG tablet     100 mg Oral 2 times daily 12/01/13 1225         Note: Portions of this report may have been transcribed using voice recognition software. Every effort was made to ensure accuracy; however, inadvertent computerized transcription errors may be present.   Any transcriptional errors that result from this process are unintentional.

## 2013-12-04 NOTE — Progress Notes (Addendum)
Called by infectious disease, Dr. Dietrich [place].  He was called by pharmacy.  Cultures apparently came back this morning 9AM after I rounded consistent with staph aureus.  Pansensitive.   Question if antibiotics can be simplified.  I was never called.  I think that is reasonable.    We'll hold off on formal infectious disease consultation at this time but switch to cefazolin only & most likely can transition to oral Augmentin for 2 weeks postop if infection remains superficial and does not involve the mesh. Again I would continue the backup plan of operative exploration if drainage and cellulitis do not improve and white count persists through tomorrow AM.  NPO post MN just in case   Specimen Description ABSCESS ABDOMEN   Special Requests Normal   Gram Stain ABUNDANT WBC PRESENT,BOTH PMN AND MONONUCLEAR  NO SQUAMOUS EPITHELIAL CELLS SEEN  FEW GRAM POSITIVE COCCI  IN PAIRS IN CLUSTERS  Performed at [facility]       Culture ABUNDANT STAPHYLOCOCCUS AUREUS  Note: RIFAMPIN AND GENTAMICIN SHOULD NOT BE USED AS SINGLE DRUGS FOR TREATMENT OF STAPH INFECTIONS.  Performed at [facility]       Report Status 12/04/2013 FINAL   Organism ID, Bacteria STAPHYLOCOCCUS AUREUS   Resulting Agency [facility]    Culture & Susceptibility      STAPHYLOCOCCUS AUREUS     Antibiotic Sensitivity Microscan Status    CLINDAMYCIN Sensitive <=0.25 SENSITIVE Final    Method: MIC    ERYTHROMYCIN Sensitive <=0.25 SENSITIVE Final    Method: MIC    GENTAMICIN Sensitive <=0.5 SENSITIVE Final    Method: MIC    LEVOFLOXACIN Sensitive 0.25 SENSITIVE Final    Method: MIC    MOXIFLOXACIN Sensitive <=0.25 SENSITIVE Final    Method: MIC    OXACILLIN Sensitive 0.5 SENSITIVE Final    Method: MIC    PENICILLIN Resistant 0.25 RESISTANT Final    Method: MIC    RIFAMPIN Sensitive <=0.5 SENSITIVE Final    Method: MIC    TETRACYCLINE Sensitive <=1 SENSITIVE Final     Method: MIC    TRIMETH/SULFA Sensitive <=10 SENSITIVE Final    Method: MIC    VANCOMYCIN Sensitive 1 SENSITIVE Final    Method: MIC    Comments STAPHYLOCOCCUS AUREUS (MIC)    ABUNDANT STAPHYLOCOCCUS AUREUS

## 2013-12-04 NOTE — Progress Notes (Signed)
Referring Physician(s): Dr Johney Maine  Subjective:  L abd wall abscess drain placed 11/6 Output 10 cc yesterday- blood tinged serous fluid Previous LLQ abscess surgical drain site---spontaneous output noted yesterday See Dr Johney Maine note Packed now   Allergies: Review of patient's allergies indicates no known allergies.  Medications: Prior to Admission medications   Medication Sig Start Date End Date Taking? Authorizing Provider  acetaminophen (TYLENOL) 500 MG tablet Take 100 mg by mouth every 8 (eight) hours as needed for fever.   Yes Historical Provider, MD  Ascorbic Acid (VITAMIN C) 1000 MG tablet Take 1,000 mg by mouth daily.     Yes Historical Provider, MD  aspirin 81 MG tablet Take 81 mg by mouth 2 (two) times daily.    Yes Historical Provider, MD  Biotin 10 MG CAPS Take 10 mg by mouth daily.    Yes Historical Provider, MD  Calcium Carbonate-Vitamin D (CALCIUM + D PO) Take 1 tablet by mouth daily.    Yes Historical Provider, MD  Cholecalciferol (VITAMIN D) 2000 UNITS CAPS Take 2,000 Units by mouth daily.    Yes Historical Provider, MD  fexofenadine-pseudoephedrine (ALLEGRA-D 24) 180-240 MG per 24 hr tablet Take 1 tablet by mouth daily.   Yes Historical Provider, MD  Glucosamine 500 MG CAPS Take 500 mg by mouth daily.    Yes Historical Provider, MD  methocarbamol (ROBAXIN) 750 MG tablet Take 1 tablet (750 mg total) by mouth 4 (four) times daily as needed (use for muscle cramps/pain). 10/13/13  Yes Michael Boston, MD  Methylcellulose, Laxative, (CITRUCEL PO) Take 2 tablets by mouth 2 (two) times daily.    Yes Historical Provider, MD  Multiple Vitamins-Minerals (MULTIVITAMIN PO) Take 1 tablet by mouth daily.    Yes Historical Provider, MD  Omega-3 Fatty Acids (FISH OIL PO) Take 900 mg by mouth daily. Taking 900 daily   Yes Historical Provider, MD  oxyCODONE (OXY IR/ROXICODONE) 5 MG immediate release tablet Take 1-2 tablets (5-10 mg total) by mouth every 4 (four) hours as needed for moderate  pain, severe pain or breakthrough pain. 10/13/13  Yes Michael Boston, MD  Red Yeast Rice 600 MG CAPS Take 1,200 mg by mouth daily.    Yes Historical Provider, MD  SALINE NASAL SPRAY NA Place 1 spray into the nose daily as needed (allergies).    Yes Historical Provider, MD  SYNTHROID 137 MCG tablet Take 1 tablet (137 mcg total) by mouth daily before breakfast. 12/28/12  Yes Elayne Snare, MD  vitamin B-12 (CYANOCOBALAMIN) 1000 MCG tablet Take 1,000 mcg by mouth daily.   Yes Historical Provider, MD  doxycycline (VIBRA-TABS) 100 MG tablet Take 1 tablet (100 mg total) by mouth 2 (two) times daily. 12/01/13   Michael Boston, MD  loratadine (CLARITIN) 10 MG tablet Take 10 mg by mouth daily as needed for allergies. As needed    Historical Provider, MD    Review of Systems  Vital Signs: BP 154/77 mmHg  Pulse 92  Temp(Src) 98.8 F (37.1 C) (Oral)  Resp 18  Ht 5\' 11"  (1.803 m)  Wt 83.462 kg (184 lb)  BMI 25.67 kg/m2  SpO2 93%  Physical Exam  Abdominal:  L abd wall abscess drain intact Site clean and dry; NT Output 10 cc yesterday Blood tinged serous fluid Flushed easily today 20 cc in JP Cx: +staph aureus     Imaging: [place] Abscess Drain  12/01/2013   CLINICAL DATA:  73 year old male with a history of anterior abdominal wall hernia repair. He has  developed abdominal wall fluid collections suspicious for abscess.  EXAM: ULTRASOUND GUIDED DRAINAGE OF ABDOMINAL WALL ABSCESS  MEDICATIONS: 1.0 mg IV Versed; 50 mcg IV Fentanyl  Total Moderate Sedation Time: 18 MIN  PROCEDURE: The procedure, risks, benefits, and alternatives were explained to the patient. Questions regarding the procedure were encouraged and answered. The patient understands and consents to the procedure.  Ultrasound survey of the left abdominal wall was performed for planning purposes. Image were stored and sent to PACs.  The left anterior abdominal wall was prepped with Betadine in a sterile fashion, and a sterile drape was applied covering  the operative field. A sterile gown and sterile gloves were used for the procedure. Local anesthesia was provided with 1% Lidocaine.  After infiltration of the skin and subcutaneous tissues a small stab incision was made with 11 blade scalpel. A 10 French pigtail drain was placed into the fluid collection with a trocar technique under ultrasound guidance. Once a drain was not placed approximately 80 cc of purulent fluid was removed.  The drain was sutured in position.  The drain was attached to bulb suction.  Final images were stored.  The patient tolerated the procedure well and remained hemodynamically stable throughout.  No complications were encountered and no significant blood loss was encounter.  COMPLICATIONS: None.  FINDINGS: Fluid collection on the anterior abdominal wall.  80 cc of purulent fluid was aspirated.  Sample was sent to the lab.  IMPRESSION: Status post ultrasound-guided drainage of left anterior abdominal wall abscess with 80 cc of purulent fluid aspirated. Sample was sent to the lab.  Signed,  Dulcy Fanny. Earleen Newport, DO  [facility]  [facility]   Electronically Signed   By: Corrie Mckusick D.O.   On: 12/01/2013 12:44   Ct Abdomen Pelvis W Contrast  11/30/2013   CLINICAL DATA:  Generalized abdominal pain and leukocytosis. Symptoms have been present since hernia repair September 2015.  EXAM: CT ABDOMEN AND PELVIS WITH CONTRAST  TECHNIQUE: Multidetector CT imaging of the abdomen and pelvis was performed using the standard protocol following bolus administration of intravenous contrast.  CONTRAST:  178mL OMNIPAQUE IOHEXOL 300 MG/ML  SOLN  COMPARISON:  08/22/2008  FINDINGS: BODY WALL: There is a ventral abdominal wall hernia repair using mesh. The upper margin of the mesh is difficult to discern, but it is at least contiguous with an irregularly shaped fluid collection in the anterior peritoneal or extraperitoneal intra-abdominal space. This peripherally  enhancing collection measures at 9 cm in craniocaudal span by up to 14 cm in transverse span by 1.3 cm in maximal thickness. The collection decompresses into the subcutaneous space through defects in the bilateral anterior abdominal wall. The largest pocket of the subcutaneous fluid collection measures approximately 7 cm in diameter by 3 cm in thickness. No subcutaneous gas or evidence of fistula.  LOWER CHEST: Unremarkable.  ABDOMEN/PELVIS:  Liver: No focal abnormality.  Biliary: Cholelithiasis. No evidence for biliary obstruction or perforation.  Pancreas: Unremarkable.  Spleen: Unremarkable.  Adrenals: Unremarkable.  Kidneys and ureters: No hydronephrosis or stone. 2 cm exophytic cyst from the left lower pole.  Bladder: Unremarkable.  Reproductive: Unremarkable.  Bowel: No obstruction. Negative appendix. Status post sigmoidectomy.  Retroperitoneum: No mass or adenopathy.  Peritoneum: No ascites or pneumoperitoneum.  Vascular: No acute abnormality.  OSSEOUS: Bilateral L5 pars defects with grade 1-2 anterolisthesis.  IMPRESSION: Extensive fluid collection, likely abscess, both superficial and deep to the anterior abdominal wall. The collection is contiguous with mesh used  for hernia repair.   Electronically Signed   By: Jorje Guild M.D.   On: 11/30/2013 21:31    Labs:  CBC:  Recent Labs  10/15/13 1155 11/30/13 1806 11/30/13 1924 12/01/13 0619 12/03/13 1240  WBC 8.0 15.8* 14.5* 13.0* 12.3*  HGB 12.3* 11.0* 10.8* 9.8* 10.7*  HCT 36.3* 34.4* 32.7* 29.3* 31.9*  PLT 238  --  377 321 373    COAGS:  Recent Labs  12/01/13 0834  INR 1.31  APTT 38*    BMP:  Recent Labs  11/30/13 1924 12/01/13 0619 12/03/13 0920 12/04/13 0504  NA 134* 135* 135* 137  K 4.1 4.1 3.8 3.9  CL 95* 99 96 98  CO2 24 23 26 28   GLUCOSE 116* 131* 131* 105*  BUN 15 12 8 10   CALCIUM 9.3 8.7 9.0 9.2  CREATININE 0.83 0.78 1.04 1.06  GFRNONAA 85* 87* 69* 68*  GFRAA >90 >90 80* 78*    LIVER FUNCTION  TESTS:  Recent Labs  06/20/13 1408 11/30/13 1924  BILITOT 0.8 0.3  AST 24 24  ALT 14 18  ALKPHOS 56 101  PROT 7.4 8.7*  ALBUMIN 3.9 2.9*    Assessment and Plan:  Will follow L abd wall abscess drain Dr Johney Maine aware of previous surgical drain site output Will follow notes   I spent a total of 15 minutes face to face in clinical consultation/evaluation, greater than 50% of which was counseling/coordinating care for L abd wall abscess drain  Signed: Indira Sorenson A 12/04/2013, 9:51 AM

## 2013-12-05 ENCOUNTER — Encounter (HOSPITAL_COMMUNITY): Payer: Self-pay | Admitting: General Practice

## 2013-12-05 LAB — CBC
HCT: 32.9 % — ABNORMAL LOW (ref 39.0–52.0)
Hemoglobin: 10.9 g/dL — ABNORMAL LOW (ref 13.0–17.0)
MCH: 29.5 pg (ref 26.0–34.0)
MCHC: 33.1 g/dL (ref 30.0–36.0)
MCV: 88.9 fL (ref 78.0–100.0)
PLATELETS: 431 10*3/uL — AB (ref 150–400)
RBC: 3.7 MIL/uL — ABNORMAL LOW (ref 4.22–5.81)
RDW: 13.6 % (ref 11.5–15.5)
WBC: 8 10*3/uL (ref 4.0–10.5)

## 2013-12-05 MED ORDER — AMOXICILLIN-POT CLAVULANATE 875-125 MG PO TABS: 1.0000 | ORAL_TABLET | Freq: Two times a day (BID) | ORAL | Status: DC

## 2013-12-05 NOTE — Plan of Care (Signed)
Problem: Phase I Progression Outcomes Goal: Pain controlled with appropriate interventions Outcome: Completed/Met Date Met:  12/05/13 Goal: OOB as tolerated unless otherwise ordered Outcome: Completed/Met Date Met:  12/05/13 Goal: Initial discharge plan identified Outcome: Completed/Met Date Met:  12/05/13 Goal: Wound assessment- dressing change as appropriate Outcome: Completed/Met Date Met:  12/05/13

## 2013-12-05 NOTE — Plan of Care (Signed)
Problem: Phase I Progression Outcomes Goal: Hemodynamically stable Outcome: Completed/Met Date Met:  12/05/13 Goal: Temperature < 102 Outcome: Completed/Met Date Met:  12/05/13

## 2013-12-05 NOTE — Progress Notes (Addendum)
[facility]  [address] [place], [place] [address] Phone: [phone] FAX: Damar [phone] 04-26-1940  CARE TEAM:  PCP: Jenny Reichmann, MD  Outpatient Care Team: Patient Care Team: Darlyne Russian, MD as PCP - General (Family Medicine) Raynelle Bring, MD as Consulting Physician (Urology) Irene Shipper, MD as Consulting Physician (Gastroenterology) Darlin Coco, MD as Consulting Physician (Cardiology)  Inpatient Treatment Team: Treatment Team: Attending Provider: Michael Boston, MD; Technician: Jolyn Nap, NT; Technician: Vivianne Spence, NT (Inactive); Registered Nurse: Maree Krabbe, RN; Technician: Karolee Stamps, NT; Registered Nurse: Camillia Herter, RN; Technician: Rometta Emery, NT; Registered Nurse: Kirstie Mirza, RN; Respiratory Therapist: Saunders Glance, RRT; Registered Nurse: Charlynn Grimes, RN; Registered Nurse: Consuello Masse, RN   Subjective:  Wife at bedside No more purulent drainage Overall feels better Abd pain down Eating better  Objective:  Vital signs:  Filed Vitals:   12/04/13 1400 12/04/13 2047 12/05/13 0526 12/05/13 0601  BP: 138/71 146/81 151/88 153/84  Pulse: 89 84 87   Temp: 98.5 F (36.9 C) 98.7 F (37.1 C) 98.7 F (37.1 C) 98.6 F (37 C)  TempSrc:      Resp: _0 Height:      Weight:      SpO2: 95% 95% 93%     Last BM Date: 12/04/13  Intake/Output   Yesterday:  11/09 0701 - 11/10 0700 In: 56 [P.O.:480; I.V.:120] Out: 645 [Urine:600; Drains:45] This shift:  Total I/O In: 5 [Other:5] Out: 630 [Urine:600; Drains:30]  Bowel function:  Flatus: y  BM: y  Drain: Thin seropurulent  Physical Exam:  General: Pt awake/alert/oriented x4 in no major acute distress Eyes: PERRL, normal EOM. Sclera nonicteric Neuro: CN II-XII intact w/o focal sensory/motor deficits. Lymph: No head/neck/groin lymphadenopathy Psych:  No  delerium/psychosis/paranoia HENT: Normocephalic, Mucus membranes moist.  No thrush Neck: Supple, No tracheal deviation Chest: No pain.  Good respiratory excursion. CV:  Pulses intact.  Regular rhythm MS: Normal AROM mjr joints.  No obvious deformity  Abdomen: Soft, Nondistended.  Nontender.  No incarcerated hernias.  Erythema resolved but edema at old colostomy incision.  Probed 2cm deep but no purulence found.  R abd soft/flat.  LUQ soft flat.  Erythema gone.  LLQ old drain site probes - clean w/o purulence.  Probed 4cm in with discrete pocket - packed.  Ext:  SCDs BLE.  No significant edema.  No cyanosis Skin: No petechiae / purpura    Problem List:   Active Problems:   Infected postoperative seroma   Assessment  Preston Reeves  72 y.o. male       Cellulitis and abscesses x 2 in abdominal wall SQ - improving but still very concerning  Plan:  Percutaneous drainage controlling LUQ upper abscess well.  Seems that drainage LLQ lower collection enough.  If not better by tomorrow, plan OR exploration with SQ washout.  Other option is repeat CT scan but would not change my plan of OR exploration if not better. Try and hold off on removal of mesh as long as infection controlled since seroma is in subcutaneous tissues and mesh is intraperitoneal.  Entire mesh removal would be plan C  Continue IV antibiotics. MSSA - simplifies to Cefazolin only.    When cellulitis resolves, transition to oral antibiotics with outpatient follow-up.  Augmentin x 2 weeks.  D/w ID MD Dr Tommy Medal.  Doubt need for  6 wk IV ABx w PICC - we will see  VTE prophylaxis- SCDs, etc  Mobilize as tolerated to help recovery  I updated the status of the patient to the patient's wife.  Also d/w pt's RN.  I made recommendations.  I answered questions.  Understanding & appreciation was expressed.   Adin Hector, M.D., F.A.C.S. Gastrointestinal and Minimally Invasive Surgery [facility] [address], [place], [place] [address] [phone] Main / Paging   12/05/2013   Results:   Labs: Results for orders placed or performed during the hospital encounter of 11/30/13 (from the past 48 hour(s))  Basic metabolic panel     Status: Abnormal   Collection Time: 12/03/13  9:20 AM  Result Value Ref Range   Sodium 135 (L) 137 - 147 mEq/L   Potassium 3.8 3.7 - 5.3 mEq/L   Chloride 96 96 - 112 mEq/L   CO2 26 19 - 32 mEq/L   Glucose, Bld 131 (H) 70 - 99 mg/dL   BUN 8 6 - 23 mg/dL   Creatinine, Ser 1.04 0.50 - 1.35 mg/dL   Calcium 9.0 8.4 - 10.5 mg/dL   GFR calc non Af Amer 69 (L) >90 mL/min   GFR calc Af Amer 80 (L) >90 mL/min    Comment: (NOTE) The eGFR has been calculated using the CKD EPI equation. This calculation has not been validated in all clinical situations. eGFR's persistently <90 mL/min signify possible Chronic Kidney Disease.    Anion gap 13 5 - 15  Vancomycin, trough     Status: None   Collection Time: 12/03/13  9:20 AM  Result Value Ref Range   Vancomycin Tr 13.1 10.0 - 20.0 ug/mL  Wound culture     Status: None (Preliminary result)   Collection Time: 12/03/13 11:46 AM  Result Value Ref Range   Specimen Description ABSCESS ABDOMEN    Special Requests WALL    Gram Stain      ABUNDANT WBC PRESENT, PREDOMINANTLY PMN NO SQUAMOUS EPITHELIAL CELLS SEEN MODERATE GRAM POSITIVE COCCI IN PAIRS IN CLUSTERS Performed at [facility]    Culture      Culture reincubated for better growth Performed at [facility]    Report Status PENDING   CBC     Status: Abnormal   Collection Time: 12/03/13 12:40 PM  Result Value Ref Range   WBC 12.3 (H) 4.0 - 10.5 K/uL   RBC 3.68 (L) 4.22 - 5.81 MIL/uL   Hemoglobin 10.7 (L) 13.0 - 17.0 g/dL   HCT 31.9 (L) 39.0 - 52.0 %   MCV 86.7 78.0 - 100.0 fL   MCH 29.1 26.0 - 34.0 pg   MCHC 33.5 30.0 - 36.0 g/dL   RDW 13.9 11.5 - 15.5 %   Platelets 373 150 - 400 K/uL  Basic metabolic panel     Status:  Abnormal   Collection Time: 12/04/13  5:04 AM  Result Value Ref Range   Sodium 137 137 - 147 mEq/L   Potassium 3.9 3.7 - 5.3 mEq/L   Chloride 98 96 - 112 mEq/L   CO2 28 19 - 32 mEq/L   Glucose, Bld 105 (H) 70 - 99 mg/dL   BUN 10 6 - 23 mg/dL   Creatinine, Ser 1.06 0.50 - 1.35 mg/dL   Calcium 9.2 8.4 - 10.5 mg/dL   GFR calc non Af Amer 68 (L) >90 mL/min   GFR calc Af Amer 78 (L) >90 mL/min  Comment: (NOTE) The eGFR has been calculated using the CKD EPI equation. This calculation has not been validated in all clinical situations. eGFR's persistently <90 mL/min signify possible Chronic Kidney Disease.    Anion gap 11 5 - 15    Imaging / Studies: No results found.  Medications / Allergies: per chart  Antibiotics: Anti-infectives    Start     Dose/Rate Route Frequency Ordered Stop   12/04/13 1400  ceFAZolin (ANCEF) IVPB 2 g/50 mL premix     2 g100 mL/hr over 30 Minutes Intravenous 3 times per day 12/04/13 1222     12/02/13 0000  gentamicin (GARAMYCIN) 550 mg in dextrose 5 % 100 mL IVPB  Status:  Discontinued     7 mg/kg  78.6 kg (Adjusted)113.8 mL/hr over 60 Minutes Intravenous Every 24 hours 12/01/13 1104 12/04/13 1222   12/01/13 1600  gentamicin (GARAMYCIN) 130 mg in dextrose 5 % 50 mL IVPB  Status:  Discontinued     130 mg106.5 mL/hr over 30 Minutes Intravenous Every 8 hours 12/01/13 0730 12/01/13 1059   12/01/13 0745  gentamicin (GARAMYCIN) 150 mg in dextrose 5 % 50 mL IVPB     150 mg107.5 mL/hr over 30 Minutes Intravenous  Once 12/01/13 0730 12/01/13 0926   12/01/13 0100  vancomycin (VANCOCIN) IVPB 1000 mg/200 mL premix  Status:  Discontinued     1,000 mg200 mL/hr over 60 Minutes Intravenous Every 12 hours 12/01/13 0051 12/04/13 1222   12/01/13 0045  piperacillin-tazobactam (ZOSYN) IVPB 3.375 g  Status:  Discontinued     3.375 g12.5 mL/hr over 240 Minutes Intravenous 3 times per day 12/01/13 0043 12/04/13 1222   12/01/13 0000  doxycycline (VIBRA-TABS) 100 MG tablet      100 mg Oral 2 times daily 12/01/13 1225         Note: Portions of this report may have been transcribed using voice recognition software. Every effort was made to ensure accuracy; however, inadvertent computerized transcription errors may be present.   Any transcriptional errors that result from this process are unintentional.

## 2013-12-06 LAB — CBC
HCT: 32.1 % — ABNORMAL LOW (ref 39.0–52.0)
Hemoglobin: 10.7 g/dL — ABNORMAL LOW (ref 13.0–17.0)
MCH: 28.8 pg (ref 26.0–34.0)
MCHC: 33.3 g/dL (ref 30.0–36.0)
MCV: 86.3 fL (ref 78.0–100.0)
Platelets: 419 10*3/uL — ABNORMAL HIGH (ref 150–400)
RBC: 3.72 MIL/uL — AB (ref 4.22–5.81)
RDW: 13.7 % (ref 11.5–15.5)
WBC: 7 10*3/uL (ref 4.0–10.5)

## 2013-12-06 LAB — ANAEROBIC CULTURE: Special Requests: NORMAL

## 2013-12-06 LAB — WOUND CULTURE

## 2013-12-06 NOTE — Progress Notes (Signed)
Patient provided with discharge instructions and follow up information. At length conversation with wife regarding dressing changes and frequency. Wife very well aware of dressing change method and able to flush at bedside. Dressing supplies given to wife at bedside. He is going home with her at this time.

## 2013-12-06 NOTE — Discharge Instructions (Signed)
DRAIN CARE:   You have a closed bulb drain to help you heal.  A bulb drain is a small, plastic reservoir which creates a gentle suction. It is used to remove excess fluid from a surgical wound. The color and amount of fluid will vary. Immediately after surgery, the fluid is bright red. It may gradually change to a yellow color. When the amount decreases to about 1 or 2 tablespoons (15 to 30 cc) per 24 hours, your caregiver will usually remove it.  DAILY CARE  Keep the bulb compressed at all times, except while emptying it. The compression creates suction.   Keep sites where the tubes enter the skin dry and covered with a light bandage (dressing).   Tape the tubes to your skin, 1 to 2 inches below the insertion sites, to keep from pulling on your stitches. Tubes are stitched in place and will not slip out.   Pin the bulb to your shirt (not to your pants) with a safety pin.   For the first few days after surgery, there usually is more fluid in the bulb. Empty the bulb whenever it becomes half full because the bulb does not create enough suction if it is too full. Include this amount in your 24 hour totals.   When the amount of drainage decreases, empty the bulb at the same time every day. Write down the amounts and the 24 hour totals. Your caregiver will want to know them. This helps your caregiver know when the tubes can be removed.   (We anticipate removing the drain in 5-10 days, depending on when the output is <2mL a day for 2+ days)  If there is drainage around the tube sites, change dressings and keep the area dry. If you see a clot in the tube, leave it alone. However, if the tube does not appear to be draining, let your caregiver know.  TO EMPTY THE BULB  Open the stopper to release suction.   Holding the stopper out of the way, pour drainage into the measuring cup that was sent home with you.   Measure and write down the amount. If there are 2 bulbs, note the amount of drainage  from bulb 1 or bulb 2 and keep the totals separate. Your caregiver will want to know which tube is draining more.   Compress the bulb by folding it in half.   Replace the stopper.   Check the tape that holds the tube to your skin, and pin the bulb to your shirt.  SEEK MEDICAL CARE IF:  The drainage develops a bad odor.   You have an oral temperature above 102 F (38.9 C).   The amount of drainage from your wound suddenly increases or decreases.   You accidentally pull out your drain.   You have any other questions or concerns.  MAKE SURE YOU:   Understand these instructions.   Will watch your condition.   Will get help right away if you are not doing well or get worse.     Call our office if you have any questions about your drain. [phone]  [place] A bulb drain consists of a thin rubber tube and a soft, round bulb that creates a gentle suction. The rubber tube is placed in the area where you had surgery. A bulb is attached to the end of the tube that is outside the body. The bulb drain removes excess fluid that normally builds up in a surgical wound after  surgery. The color and amount of fluid will vary. Immediately after surgery, the fluid is bright red and is a little thicker than water. It may gradually change to a yellow or pink color and become more thin and water-like. When the amount decreases to about 1 or 2 tbsp in 24 hours, your health care provider will usually remove it. DAILY CARE  Keep the bulb flat (compressed) at all times, except while emptying it. The flatness creates suction. You can flatten the bulb by squeezing it firmly in the middle and then closing the cap.  Keep sites where the tube enters the skin dry and covered with a bandage (dressing).  Secure the tube 1-2 in (2.5-5.1 cm) below the insertion sites to keep it from pulling on your stitches. The tube is stitched in place and will not slip out.  Secure the bulb as directed by your  health care provider.  For the first 3 days after surgery, there usually is more fluid in the bulb. Empty the bulb whenever it becomes half full because the bulb does not create enough suction if it is too full. The bulb could also overflow. Write down how much fluid you remove each time you empty your drain. Add up the amount removed in 24 hours.  Empty the bulb at the same time every day once the amount of fluid decreases and you only need to empty it once a day. Write down the amounts and the 24-hour totals to give to your health care provider. This helps your health care provider know when the tubes can be removed. EMPTYING THE BULB DRAIN Before emptying the bulb, get a measuring cup, a piece of paper and a pen, and wash your hands.  Gently run your fingers down the tube (stripping) to empty any drainage from the tubing into the bulb. This may need to be done several times a day to clear the tubing of clots and tissue.  Open the bulb cap to release suction, which causes it to inflate. Do not touch the inside of the cap.  Gently run your fingers down the tube (stripping) to empty any drainage from the tubing into the bulb.  Hold the cap out of the way, and pour fluid into the measuring cup.   Squeeze the bulb to provide suction.  Replace the cap.   Check the tape that holds the tube to your skin. If it is becoming loose, you can remove the loose piece of tape and apply a new one. Then, pin the bulb to your shirt.   Write down the amount of fluid you emptied out. Write down the date and each time you emptied your bulb drain. (If there are 2 bulbs, note the amount of drainage from each bulb and keep the totals separate. Your health care provider will want to know the total amounts for each drain and which tube is draining more.)   Flush the fluid down the toilet and wash your hands.   Call your health care provider once you have less than 2 tbsp of fluid collecting in the bulb drain  every 24 hours. If there is drainage around the tube site, change dressings and keep the area dry. Cleanse around tube with sterile saline and place dry gauze around site. This gauze should be changed when it is soiled. If it stays clean and unsoiled, it should still be changed daily.  SEEK MEDICAL CARE IF:  Your drainage has a bad smell or is cloudy.  You have a fever.   Your drainage is increasing instead of decreasing.   Your tube fell out.   You have redness or swelling around the tube site.   You have drainage from a surgical wound.   Your bulb drain will not stay flat after you empty it.  MAKE SURE YOU:   Understand these instructions.  Will watch your condition.  Will get help right away if you are not doing well or get worse. Document Released: 01/10/2000 Document Revised: 05/29/2013 Document Reviewed: 06/17/2011 [facility] Patient Information 2015 [place], [place]. This information is not intended to replace advice given to you by your health care provider. Make sure you discuss any questions you have with your health care provider.   Managing Pain  Pain after surgery or related to activity is often due to strain/injury to muscle, tendon, nerves and/or incisions.  This pain is usually short-term and will improve in a few months.   Many people find it helpful to do the following things TOGETHER to help speed the process of healing and to get back to regular activity more quickly:  1. Avoid heavy physical activity a.  no lifting greater than 20 pounds b. Do not push through the pain.  Listen to your body and avoid positions and maneuvers than reproduce the pain c. Walking is okay as tolerated, but go slowly and stop when getting sore.  d. Remember: If it hurts to do it, then dont do it! 2. Take Anti-inflammatory medication  a. Take with food/snack around the clock for 1-2 weeks i. This helps the muscle and nerve tissues become less irritable and calm down  faster b. Choose ONE of the following over-the-counter medications: i. Naproxen 220mg  tabs (ex. Aleve) 1-2 pills twice a day  ii. Ibuprofen 200mg  tabs (ex. Advil, Motrin) 3-4 pills with every meal and just before bedtime iii. Acetaminophen 500mg  tabs (Tylenol) 1-2 pills with every meal and just before bedtime 3. Use a Heating pad or Ice/Cold Pack a. 4-6 times a day b. May use warm bath/hottub  or showers 4. Try Gentle Massage and/or Stretching  a. at the area of pain many times a day b. stop if you feel pain - do not overdo it  Try these steps together to help you body heal faster and avoid making things get worse.  Doing just one of these things may not be enough.    If you are not getting better after two weeks or are noticing you are getting worse, contact our office for further advice; we may need to re-evaluate you & see what other things we can do to help.  GETTING TO GOOD BOWEL HEALTH. Irregular bowel habits such as constipation and diarrhea can lead to many problems over time.  Having one soft bowel movement a day is the most important way to prevent further problems.  The anorectal canal is designed to handle stretching and feces to safely manage our ability to get rid of solid waste (feces, poop, stool) out of our body.  BUT, hard constipated stools can act like ripping concrete bricks and diarrhea can be a burning fire to this very sensitive area of our body, causing inflamed hemorrhoids, anal fissures, increasing risk is perirectal abscesses, abdominal pain/bloating, an making irritable bowel worse.     The goal: ONE SOFT BOWEL MOVEMENT A DAY!  To have soft, regular bowel movements:   Drink at least 8 tall glasses of water a day.    Take plenty of fiber.  Fiber is the undigested part of plant food that passes into the colon, acting s natures broom to encourage bowel motility and movement.  Fiber can absorb and hold large amounts of water. This results in a larger, bulkier stool,  which is soft and easier to pass. Work gradually over several weeks up to 6 servings a day of fiber (25g a day even more if needed) in the form of: o Vegetables -- Root (potatoes, carrots, turnips), leafy green (lettuce, salad greens, celery, spinach), or cooked high residue (cabbage, broccoli, etc) o Fruit -- Fresh (unpeeled skin & pulp), Dried (prunes, apricots, cherries, etc ),  or stewed ( applesauce)  o Whole grain breads, pasta, etc (whole wheat)  o Bran cereals   Bulking Agents -- This type of water-retaining fiber generally is easily obtained each day by one of the following:  o Psyllium bran -- The psyllium plant is remarkable because its ground seeds can retain so much water. This product is available as Metamucil, Konsyl, Effersyllium, Per Diem Fiber, or the less expensive generic preparation in drug and health food stores. Although labeled a laxative, it really is not a laxative.  o Methylcellulose -- This is another fiber derived from wood which also retains water. It is available as Citrucel. o Polyethylene Glycol - and artificial fiber commonly called Miralax or Glycolax.  It is helpful for people with gassy or bloated feelings with regular fiber o Flax Seed - a less gassy fiber than psyllium  No reading or other relaxing activity while on the toilet. If bowel movements take longer than 5 minutes, you are too constipated  AVOID CONSTIPATION.  High fiber and water intake usually takes care of this.  Sometimes a laxative is needed to stimulate more frequent bowel movements, but   Laxatives are not a good long-term solution as it can wear the colon out. o Osmotics (Milk of Magnesia, Fleets phosphosoda, Magnesium citrate, MiraLax, GoLytely) are safer than  o Stimulants (Senokot, Castor Oil, Dulcolax, Ex Lax)    o Do not take laxatives for more than 7days in a row.   IF SEVERELY CONSTIPATED, try a Bowel Retraining Program: o Do not use laxatives.  o Eat a diet high in roughage,  such as bran cereals and leafy vegetables.  o Drink six (6) ounces of prune or apricot juice each morning.  o Eat two (2) large servings of stewed fruit each day.  o Take one (1) heaping tablespoon of a psyllium-based bulking agent twice a day. Use sugar-free sweetener when possible to avoid excessive calories.  o Eat a normal breakfast.  o Set aside 15 minutes after breakfast to sit on the toilet, but do not strain to have a bowel movement.  o If you do not have a bowel movement by the third day, use an enema and repeat the above steps.   Controlling diarrhea o Switch to liquids and simpler foods for a few days to avoid stressing your intestines further. o Avoid dairy products (especially milk & ice cream) for a short time.  The intestines often can lose the ability to digest lactose when stressed. o Avoid foods that cause gassiness or bloating.  Typical foods include beans and other legumes, cabbage, broccoli, and dairy foods.  Every person has some sensitivity to other foods, so listen to our body and avoid those foods that trigger problems for you. o Adding fiber (Citrucel, Metamucil, psyllium, Miralax) gradually can help thicken stools by absorbing excess fluid and retrain the intestines to  act more normally.  Slowly increase the dose over a few weeks.  Too much fiber too soon can backfire and cause cramping & bloating. o Probiotics (such as active yogurt, Align, etc) may help repopulate the intestines and colon with normal bacteria and calm down a sensitive digestive tract.  Most studies show it to be of mild help, though, and such products can be costly. o Medicines: - Bismuth subsalicylate (ex. Kayopectate, Pepto Bismol) every 30 minutes for up to 6 doses can help control diarrhea.  Avoid if pregnant. - Loperamide (Immodium) can slow down diarrhea.  Start with two tablets (4mg  total) first and then try one tablet every 6 hours.  Avoid if you are having fevers or severe pain.  If you are not  better or start feeling worse, stop all medicines and call your doctor for advice o Call your doctor if you are getting worse or not better.  Sometimes further testing (cultures, endoscopy, X-ray studies, bloodwork, etc) may be needed to help diagnose and treat the cause of the diarrhea. o

## 2013-12-06 NOTE — Discharge Summary (Signed)
Physician Discharge Summary  Patient ID: Preston Reeves MRN: [id number] DOB/AGE: [birth year] 73 y.o.  Admit date: 11/30/2013 Discharge date: 12/06/2013  Patient Care Team: Darlyne Russian, MD as PCP - General (Family Medicine) Raynelle Bring, MD as Consulting Physician (Urology) Irene Shipper, MD as Consulting Physician (Gastroenterology) Darlin Coco, MD as Consulting Physician (Cardiology)  Admission Diagnoses: Active Problems:   Infected postoperative seroma   Discharge Diagnoses:  Active Problems:   Infected postoperative seroma   Consults: ID (telephone)  Hospital Course:   The patient underwent complex [place] repair 2 months ago & returned with SQ abscesses x 2.  He was admitted, placed on broad IV ABx.  Drain placed.  Inferior abscess drained to skin.  Grtew MSSA.  Celluitis resolved.  Pain much less.  WBC WNL.  No more fevers.  The patient gradually mobilized and advanced to a solid diet.  Pain and other symptoms were treated aggressively.    By the time of discharge, the patient was walking well the hallways, eating food, having flatus.  Pain was well-controlled on an oral medications.  Based on meeting discharge criteria and continuing to recover, I felt it was safe for the patient to be discharged from the hospital to further recover with close followup. PO ABx Augmentin x2 weeks.  Flush drain & probably remove next week if <53m/day x2 days in a row.  Postoperative recommendations were discussed in detail.  They are written as well.   Significant Diagnostic Studies:  Results for orders placed or performed during the hospital encounter of 11/30/13 (from the past 72 hour(s))  Basic metabolic panel     Status: Abnormal   Collection Time: 12/03/13  9:20 AM  Result Value Ref Range   Sodium 135 (L) 137 - 147 mEq/L   Potassium 3.8 3.7 - 5.3 mEq/L   Chloride 96 96 - 112 mEq/L   CO2 26 19 - 32 mEq/L   Glucose, Bld 131 (H) 70 - 99 mg/dL   BUN 8 6 - 23 mg/dL   Creatinine, Ser  1.04 0.50 - 1.35 mg/dL   Calcium 9.0 8.4 - 10.5 mg/dL   GFR calc non Af Amer 69 (L) >90 mL/min   GFR calc Af Amer 80 (L) >90 mL/min    Comment: (NOTE) The eGFR has been calculated using the CKD EPI equation. This calculation has not been validated in all clinical situations. eGFR's persistently <90 mL/min signify possible Chronic Kidney Disease.    Anion gap 13 5 - 15  Vancomycin, trough     Status: None   Collection Time: 12/03/13  9:20 AM  Result Value Ref Range   Vancomycin Tr 13.1 10.0 - 20.0 ug/mL  Wound culture     Status: None (Preliminary result)   Collection Time: 12/03/13 11:46 AM  Result Value Ref Range   Specimen Description ABSCESS ABDOMEN    Special Requests WALL    Gram Stain      ABUNDANT WBC PRESENT, PREDOMINANTLY PMN NO SQUAMOUS EPITHELIAL CELLS SEEN MODERATE GRAM POSITIVE COCCI IN PAIRS IN CLUSTERS Performed at [facility]   Culture      MODERATE STAPHYLOCOCCUS AUREUS Note: RIFAMPIN AND GENTAMICIN SHOULD NOT BE USED AS SINGLE DRUGS FOR TREATMENT OF STAPH INFECTIONS. Performed at [facility]   Report Status PENDING   CBC     Status: Abnormal   Collection Time: 12/03/13 12:40 PM  Result Value Ref Range   WBC 12.3 (H) 4.0 - 10.5 K/uL   RBC  3.68 (L) 4.22 - 5.81 MIL/uL   Hemoglobin 10.7 (L) 13.0 - 17.0 g/dL   HCT 31.9 (L) 39.0 - 52.0 %   MCV 86.7 78.0 - 100.0 fL   MCH 29.1 26.0 - 34.0 pg   MCHC 33.5 30.0 - 36.0 g/dL   RDW 13.9 11.5 - 15.5 %   Platelets 373 150 - 400 K/uL  Basic metabolic panel     Status: Abnormal   Collection Time: 12/04/13  5:04 AM  Result Value Ref Range   Sodium 137 137 - 147 mEq/L   Potassium 3.9 3.7 - 5.3 mEq/L   Chloride 98 96 - 112 mEq/L   CO2 28 19 - 32 mEq/L   Glucose, Bld 105 (H) 70 - 99 mg/dL   BUN 10 6 - 23 mg/dL   Creatinine, Ser 1.06 0.50 - 1.35 mg/dL   Calcium 9.2 8.4 - 10.5 mg/dL   GFR calc non Af Amer 68 (L) >90 mL/min   GFR calc Af Amer 78 (L) >90 mL/min    Comment: (NOTE) The eGFR has  been calculated using the CKD EPI equation. This calculation has not been validated in all clinical situations. eGFR's persistently <90 mL/min signify possible Chronic Kidney Disease.    Anion gap 11 5 - 15  CBC     Status: Abnormal   Collection Time: 12/05/13 12:07 PM  Result Value Ref Range   WBC 8.0 4.0 - 10.5 K/uL   RBC 3.70 (L) 4.22 - 5.81 MIL/uL   Hemoglobin 10.9 (L) 13.0 - 17.0 g/dL   HCT 32.9 (L) 39.0 - 52.0 %   MCV 88.9 78.0 - 100.0 fL   MCH 29.5 26.0 - 34.0 pg   MCHC 33.1 30.0 - 36.0 g/dL   RDW 13.6 11.5 - 15.5 %   Platelets 431 (H) 150 - 400 K/uL  CBC     Status: Abnormal   Collection Time: 12/06/13  4:30 AM  Result Value Ref Range   WBC 7.0 4.0 - 10.5 K/uL   RBC 3.72 (L) 4.22 - 5.81 MIL/uL   Hemoglobin 10.7 (L) 13.0 - 17.0 g/dL   HCT 32.1 (L) 39.0 - 52.0 %   MCV 86.3 78.0 - 100.0 fL   MCH 28.8 26.0 - 34.0 pg   MCHC 33.3 30.0 - 36.0 g/dL   RDW 13.7 11.5 - 15.5 %   Platelets 419 (H) 150 - 400 K/uL    No results found.  Discharge Exam: Blood pressure 147/73, pulse 78, temperature 98.6 F (37 C), temperature source Oral, resp. rate 16, height '5\' 11"'  (1.803 m), weight 184 lb (83.462 kg), SpO2 95 %.  General: Pt awake/alert/oriented x4 in no major acute distress Eyes: PERRL, normal EOM. Sclera nonicteric Neuro: CN II-XII intact w/o focal sensory/motor deficits. Lymph: No head/neck/groin lymphadenopathy Psych:  No delerium/psychosis/paranoia HENT: Normocephalic, Mucus membranes moist.  No thrush Neck: Supple, No tracheal deviation Chest: No pain.  Good respiratory excursion. CV:  Pulses intact.  Regular rhythm MS: Normal AROM mjr joints.  No obvious deformity Abdomen: Soft, Nondistended.  Nontender.  No incarcerated hernias.  No cellulitis.  LLQ wound clean w/o purulence tracks 4cm supermedially.  LUQ drain with serous fluid.   Ext:  SCDs BLE.  No significant edema.  No cyanosis Skin: No petechiae / purpura  Discharged Condition: good   Past Medical History   Diagnosis Date  . MVP (mitral valve prolapse)   . Diverticulosis     hx of  . History of BPH   .  Hemochromatosis     hx of  . History of COPD   . Thyroid disease   . Incisional hernia   . Hyperlipidemia   . Diverticulitis of colon with perforation 2010    Colectomy/ostomy  . Colon polyps     Adenomatous  . Allergy     SEASONAL  . Cataract   . Hypothyroidism     Past Surgical History  Procedure Laterality Date  . Inguinal hernia repair Left 1999    Dr. Rebekah Chesterfield  . Partial colectomy  08/12/2008    Jeanette Caprice  . Colostomy takedown  2010    Dr Barry Dienes  . Colonoscopy w/ biopsies  2011    Dr Henrene Pastor  . Colonoscopy    . Upper gi endoscopy    . Ventral hernia repair N/A 10/13/2013    Procedure: REPAIR OF VENTRAL WALL HERNIA WITH UNILATERAL COMPARTMENT SEPARATION;  Surgeon: Michael Boston, MD;  Location: [place];  Service: General;  Laterality: N/A;  . Insertion of mesh N/A 10/13/2013    Procedure: INSERTION OF MESH;  Surgeon: Michael Boston, MD;  Location: [place];  Service: General;  Laterality: N/A;  . Laparoscopic lysis of adhesions N/A 10/13/2013    Procedure: LAPAROSCOPIC LYSIS OF ADHESIONS;  Surgeon: Michael Boston, MD;  Location: [place];  Service: General;  Laterality: N/A;    History   Social History  . Marital Status: Married    Spouse Name: N/A    Number of Children: N/A  . Years of Education: N/A   Occupational History  . Retired    Social History Main Topics  . Smoking status: Former Smoker    Types: Cigarettes  . Smokeless tobacco: Never Used  . Alcohol Use: Yes     Comment: drinks beer weekly  . Drug Use: No  . Sexual Activity: Yes   Other Topics Concern  . Not on file   Social History Narrative   Married. Education: [employer].    Family History  Problem Relation Age of Onset  . Breast cancer Mother   . Colon cancer Father   . Heart disease Father   . Diabetes Father     Current Facility-Administered Medications  Medication Dose Route Frequency Provider  Last Rate Last Dose  . 0.9 %  sodium chloride infusion  250 mL Intravenous PRN Michael Boston, MD      . acetaminophen (TYLENOL) tablet 1,000 mg  1,000 mg Oral Q8H PRN Stark Klein, MD   1,000 mg at 12/02/13 0149  . bisacodyl (DULCOLAX) suppository 10 mg  10 mg Rectal Q12H PRN Michael Boston, MD      . ceFAZolin (ANCEF) IVPB 2 g/50 mL premix  2 g Intravenous 3 times per day Michael Boston, MD   2 g at 12/06/13 0509  . dextrose 5 % and 0.45 % NaCl with KCl 20 mEq/L infusion   Intravenous Continuous Michael Boston, MD 10 mL/hr at 12/03/13 2358    . diphenhydrAMINE (BENADRYL) injection 12.5 mg  12.5 mg Intravenous Q6H PRN Stark Klein, MD       Or  . diphenhydrAMINE (BENADRYL) 12.5 MG/5ML elixir 12.5 mg  12.5 mg Oral Q6H PRN Stark Klein, MD      . levothyroxine (SYNTHROID, LEVOTHROID) tablet 137 mcg  137 mcg Oral QAC breakfast Stark Klein, MD   137 mcg at 12/06/13 [phone]  . loratadine (CLARITIN) tablet 10 mg  10 mg Oral Daily Stark Klein, MD   10 mg at 12/05/13 1054  . magnesium hydroxide (MILK OF MAGNESIA) suspension  15 mL  15 mL Oral Daily Megan N Dort, PA-C   15 mL at 12/05/13 1052  . methocarbamol (ROBAXIN) tablet 750 mg  750 mg Oral QID PRN Stark Klein, MD      . morphine 2 MG/ML injection 1-2 mg  1-2 mg Intravenous Q2H PRN Stark Klein, MD      . multivitamins with iron tablet 1 tablet  1 tablet Oral Daily Michael Boston, MD   1 tablet at 12/05/13 1054  . ondansetron (ZOFRAN) injection 4 mg  4 mg Intravenous Q6H PRN Stark Klein, MD      . oxyCODONE (Oxy IR/ROXICODONE) immediate release tablet 5-10 mg  5-10 mg Oral Q4H PRN Stark Klein, MD   5 mg at 12/06/13 0509  . polyethylene glycol (MIRALAX / GLYCOLAX) packet 17 g  17 g Oral Q12H PRN Michael Boston, MD      . psyllium (HYDROCIL/METAMUCIL) packet 1 packet  1 packet Oral BID Michael Boston, MD   1 packet at 12/05/13 2150  . saccharomyces boulardii (FLORASTOR) capsule 250 mg  250 mg Oral BID Michael Boston, MD   250 mg at 12/05/13 2150  . sodium chloride  (OCEAN) 0.65 % nasal spray 1 spray  1 spray Nasal Daily PRN Stark Klein, MD      . sodium chloride 0.9 % injection 3 mL  3 mL Intravenous Q12H Michael Boston, MD   3 mL at 12/05/13 2200  . sodium chloride 0.9 % injection 3 mL  3 mL Intravenous PRN Michael Boston, MD      . vitamin C (ASCORBIC ACID) tablet 500 mg  500 mg Oral BID Michael Boston, MD   500 mg at 12/05/13 2150     No Known Allergies  Disposition: 01-Home or Self Care  Discharge Instructions    Call MD for:  extreme fatigue    Complete by:  As directed      Call MD for:  extreme fatigue    Complete by:  As directed      Call MD for:  hives    Complete by:  As directed      Call MD for:  hives    Complete by:  As directed      Call MD for:  persistant nausea and vomiting    Complete by:  As directed      Call MD for:  persistant nausea and vomiting    Complete by:  As directed      Call MD for:  redness, tenderness, or signs of infection (pain, swelling, redness, odor or green/yellow discharge around incision site)    Complete by:  As directed      Call MD for:  redness, tenderness, or signs of infection (pain, swelling, redness, odor or green/yellow discharge around incision site)    Complete by:  As directed      Call MD for:  severe uncontrolled pain    Complete by:  As directed      Call MD for:  severe uncontrolled pain    Complete by:  As directed      Call MD for:    Complete by:  As directed   Temperature > 101.33F     Call MD for:    Complete by:  As directed   Temperature > 101.33F     Diet - low sodium heart healthy    Complete by:  As directed      Diet - low sodium heart healthy    Complete  by:  As directed      Discharge instructions    Complete by:  As directed   Please see discharge instruction sheets.  Also refer to handout given an office.  Please call our office if you have any questions or concerns [phone]     Discharge instructions    Complete by:  As directed   Please see discharge  instruction sheets.  Also refer to handout given an office.  Please call our office if you have any questions or concerns [phone]     Discharge wound care:    Complete by:  As directed   If you have closed incisions, shower and bathe over these incisions with soap and water every day.  Remove all surgical dressings on postoperative day #3.  You do not need to replace dressings over the closed incisions unless you feel more comfortable with a Band-Aid covering it.   If you have an open wound that requires packing, please see wound care instructions.  In general, remove all dressings, wash wound with soap and water and then replace with saline moistened gauze.  Do the dressing change at least every day.  Please call our office [phone] if you have further questions.     Discharge wound care:    Complete by:  As directed   You have an open wound that requires packing, please see wound care instructions - use 1/4" NU Gauze packing once a day.  Flush the drain at least once a day with saline fluid in a syringe.  In general, remove all dressings, wash wound with soap and water and then replace with saline moistened gauze.  Do the dressing change at least every day.  Please call our office [phone] if you have further questions.     Driving Restrictions    Complete by:  As directed   No driving until off narcotics and can safely swerve away without pain during an emergency     Driving Restrictions    Complete by:  As directed   No driving until off narcotics and can safely swerve away without pain during an emergency     Increase activity slowly    Complete by:  As directed   Walk an hour a day.  Use 20-30 minute walks.  When you can walk 30 minutes without difficulty, increase to low impact/moderate activities such as biking, jogging, swimming, sexual activity..  Eventually can increase to unrestricted activity when not feeling pain.  If you feel pain: STOP!Marland Kitchen   Let pain protect you from  overdoing it.  Use ice/heat/over-the-counter pain medications to help minimize his soreness.  Use pain prescriptions as needed to remain active.  It is better to take extra pain medications and be more active than to stay bedridden to avoid all pain medications.     Increase activity slowly    Complete by:  As directed   Walk an hour a day.  Use 20-30 minute walks.  When you can walk 30 minutes without difficulty, increase to low impact/moderate activities such as biking, jogging, swimming, sexual activity..  Eventually can increase to unrestricted activity when not feeling pain.  If you feel pain: STOP!Marland Kitchen   Let pain protect you from overdoing it.  Use ice/heat/over-the-counter pain medications to help minimize his soreness.  Use pain prescriptions as needed to remain active.  It is better to take extra pain medications and be more active than to stay bedridden to avoid all pain medications.  Lifting restrictions    Complete by:  As directed   Avoid heavy lifting initially.  Do not push through pain.  You have no specific weight limit.  Coughing and sneezing or four more stressful to your incision than any lifting you will do. Pain will protect you from injury.  Therefore, avoid intense activity until off all narcotic pain medications.  Coughing and sneezing or four more stressful to your incision than any lifting he will do.     Lifting restrictions    Complete by:  As directed   Avoid heavy lifting initially.  Do not push through pain.  You have no specific weight limit.  Coughing and sneezing or four more stressful to your incision than any lifting you will do. Pain will protect you from injury.  Therefore, avoid intense activity until off all narcotic pain medications.  Coughing and sneezing or four more stressful to your incision than any lifting he will do.     May shower / Bathe    Complete by:  As directed      May shower / Bathe    Complete by:  As directed      May walk up steps     Complete by:  As directed      May walk up steps    Complete by:  As directed      Sexual Activity Restrictions    Complete by:  As directed   Sexual activity as tolerated.  Do not push through pain.  Pain will protect you from injury.     Sexual Activity Restrictions    Complete by:  As directed   Sexual activity as tolerated.  Do not push through pain.  Pain will protect you from injury.     Walk with assistance    Complete by:  As directed   Walk over an hour a day.  May use a walker/cane/companion to help with balance and stamina.     Walk with assistance    Complete by:  As directed   Walk over an hour a day.  May use a walker/cane/companion to help with balance and stamina.            Medication List    TAKE these medications        acetaminophen 500 MG tablet  Commonly known as:  TYLENOL  Take 100 mg by mouth every 8 (eight) hours as needed for fever.     amoxicillin-clavulanate 875-125 MG per tablet  Commonly known as:  AUGMENTIN  Take 1 tablet by mouth 2 (two) times daily.     aspirin 81 MG tablet  Take 81 mg by mouth 2 (two) times daily.     Biotin 10 MG Caps  Take 10 mg by mouth daily.     CALCIUM + D PO  Take 1 tablet by mouth daily.     CITRUCEL PO  Take 2 tablets by mouth 2 (two) times daily.     fexofenadine-pseudoephedrine 180-240 MG per 24 hr tablet  Commonly known as:  ALLEGRA-D 24  Take 1 tablet by mouth daily.     FISH OIL PO  Take 900 mg by mouth daily. Taking 900 daily     Glucosamine 500 MG Caps  Take 500 mg by mouth daily.     loratadine 10 MG tablet  Commonly known as:  CLARITIN  Take 10 mg by mouth daily as needed for allergies. As needed     methocarbamol 750 MG tablet  Commonly known as:  ROBAXIN  Take 1 tablet (750 mg total) by mouth 4 (four) times daily as needed (use for muscle cramps/pain).     MULTIVITAMIN PO  Take 1 tablet by mouth daily.     oxyCODONE 5 MG immediate release tablet  Commonly known as:  Oxy  IR/ROXICODONE  Take 1-2 tablets (5-10 mg total) by mouth every 4 (four) hours as needed for moderate pain, severe pain or breakthrough pain.     Red Yeast Rice 600 MG Caps  Take 1,200 mg by mouth daily.     SALINE NASAL SPRAY NA  Place 1 spray into the nose daily as needed (allergies).     SYNTHROID 137 MCG tablet  Generic drug:  levothyroxine  Take 1 tablet (137 mcg total) by mouth daily before breakfast.     vitamin B-12 1000 MCG tablet  Commonly known as:  CYANOCOBALAMIN  Take 1,000 mcg by mouth daily.     vitamin C 1000 MG tablet  Take 1,000 mg by mouth daily.     Vitamin D 2000 UNITS Caps  Take 2,000 Units by mouth daily.       Follow-up Information    Follow up with Brandis Wixted C., MD In 2 weeks.   Specialty:  General Surgery   Contact information:   [address] [place] [address] [phone]        Signed: Morton Peters, M.D., F.A.C.S. Gastrointestinal and Minimally Invasive Surgery [facility] [address] [place], [place] [address] [phone] Main / Paging   12/06/2013, 7:13 AM

## 2013-12-19 ENCOUNTER — Telehealth: Payer: Self-pay

## 2013-12-19 ENCOUNTER — Ambulatory Visit (INDEPENDENT_AMBULATORY_CARE_PROVIDER_SITE_OTHER): Payer: Commercial Managed Care - HMO | Admitting: Emergency Medicine

## 2013-12-19 ENCOUNTER — Encounter: Payer: Self-pay | Admitting: Emergency Medicine

## 2013-12-19 VITALS — BP 176/86 | HR 90 | Temp 97.5°F | Resp 16 | Ht 70.0 in | Wt 180.0 lb

## 2013-12-19 DIAGNOSIS — R972 Elevated prostate specific antigen [PSA]: Secondary | ICD-10-CM

## 2013-12-19 DIAGNOSIS — E039 Hypothyroidism, unspecified: Secondary | ICD-10-CM

## 2013-12-19 NOTE — Telephone Encounter (Signed)
Called him to advise he voiced understanding.

## 2013-12-19 NOTE — Progress Notes (Addendum)
Subjective:  This chart was scribed for Preston Queen, MD by Erling Conte, Medical Scribe. This patient was seen in Room 21 and the patient's care was started at 11:59 AM.   Patient ID: Preston Reeves, male    DOB: 04/24/1940, 73 y.o.   MRN: [id number]  Chief Complaint  Patient presents with  . Follow-up  . Prostate Check  . Referral    thyroid/endo   HPI HPI Comments: Preston Reeves is a 73 y.o. male who presents to the [facility] for a follow up of his prostate. He also needs a referral for an endocrinologist, Dr. Dwyane Dee. Pt recently had surgery done by Dr. Johney Maine to remove an abdominal abscess. Otherwise pt is doing well.   Patient Active Problem List   Diagnosis Date Noted  . Infected postoperative seroma 11/30/2013  . Ventral hernia 10/16/2013  . Incisional hernias s/p lap/open VWH repair w mesh 06/20/2013  . Other postablative hypothyroidism 12/28/2012  . Dyspnea on exertion 06/16/2010  . MVP (mitral valve prolapse)   . Diverticulosis   . History of BPH   . Hemochromatosis   . COPD (chronic obstructive pulmonary disease)    Past Medical History  Diagnosis Date  . MVP (mitral valve prolapse)   . Diverticulosis     hx of  . History of BPH   . Hemochromatosis     hx of  . History of COPD   . Thyroid disease   . Incisional hernia   . Hyperlipidemia   . Diverticulitis of colon with perforation 2010    Colectomy/ostomy  . Colon polyps     Adenomatous  . Allergy     SEASONAL  . Cataract   . Hypothyroidism    Past Surgical History  Procedure Laterality Date  . Inguinal hernia repair Left 1999    Dr. Rebekah Chesterfield  . Partial colectomy  08/12/2008    Jeanette Caprice  . Colostomy takedown  2010    Dr Barry Dienes  . Colonoscopy w/ biopsies  2011    Dr Henrene Pastor  . Colonoscopy    . Upper gi endoscopy    . Ventral hernia repair N/A 10/13/2013    Procedure: REPAIR OF VENTRAL WALL HERNIA WITH UNILATERAL COMPARTMENT SEPARATION;  Surgeon: Michael Boston, MD;  Location: [place];  Service: General;  Laterality: N/A;  . Insertion of mesh N/A 10/13/2013    Procedure: INSERTION OF MESH;  Surgeon: Michael Boston, MD;  Location: [place];  Service: General;  Laterality: N/A;  . Laparoscopic lysis of adhesions N/A 10/13/2013    Procedure: LAPAROSCOPIC LYSIS OF ADHESIONS;  Surgeon: Michael Boston, MD;  Location: [place];  Service: General;  Laterality: N/A;   No Known Allergies Prior to Admission medications   Medication Sig Start Date End Date Taking? Authorizing Provider  acetaminophen (TYLENOL) 500 MG tablet Take 100 mg by mouth every 8 (eight) hours as needed for fever.    Historical Provider, MD  amoxicillin-clavulanate (AUGMENTIN) 875-125 MG per tablet Take 1 tablet by mouth 2 (two) times daily. 12/05/13   Michael Boston, MD  Ascorbic Acid (VITAMIN C) 1000 MG tablet Take 1,000 mg by mouth daily.      Historical Provider, MD  aspirin 81 MG tablet Take 81 mg by mouth 2 (two) times daily.     Historical Provider, MD  Biotin 10 MG CAPS Take 10 mg by mouth daily.     Historical Provider, MD  Calcium Carbonate-Vitamin D (CALCIUM + D PO) Take 1 tablet  by mouth daily.     Historical Provider, MD  Cholecalciferol (VITAMIN D) 2000 UNITS CAPS Take 2,000 Units by mouth daily.     Historical Provider, MD  fexofenadine-pseudoephedrine (ALLEGRA-D 24) 180-240 MG per 24 hr tablet Take 1 tablet by mouth daily.    Historical Provider, MD  Glucosamine 500 MG CAPS Take 500 mg by mouth daily.     Historical Provider, MD  loratadine (CLARITIN) 10 MG tablet Take 10 mg by mouth daily as needed for allergies. As needed    Historical Provider, MD  methocarbamol (ROBAXIN) 750 MG tablet Take 1 tablet (750 mg total) by mouth 4 (four) times daily as needed (use for muscle cramps/pain). 10/13/13   Michael Boston, MD  Methylcellulose, Laxative, (CITRUCEL PO) Take 2 tablets by mouth 2 (two) times daily.     Historical Provider, MD  Multiple Vitamins-Minerals (MULTIVITAMIN PO) Take 1 tablet by mouth daily.      Historical Provider, MD  Omega-3 Fatty Acids (FISH OIL PO) Take 900 mg by mouth daily. Taking 900 daily    Historical Provider, MD  oxyCODONE (OXY IR/ROXICODONE) 5 MG immediate release tablet Take 1-2 tablets (5-10 mg total) by mouth every 4 (four) hours as needed for moderate pain, severe pain or breakthrough pain. 10/13/13   Michael Boston, MD  Red Yeast Rice 600 MG CAPS Take 1,200 mg by mouth daily.     Historical Provider, MD  SALINE NASAL SPRAY NA Place 1 spray into the nose daily as needed (allergies).     Historical Provider, MD  SYNTHROID 137 MCG tablet Take 1 tablet (137 mcg total) by mouth daily before breakfast. 12/28/12   Elayne Snare, MD  vitamin B-12 (CYANOCOBALAMIN) 1000 MCG tablet Take 1,000 mcg by mouth daily.    Historical Provider, MD   History   Social History  . Marital Status: Married    Spouse Name: N/A    Number of Children: N/A  . Years of Education: N/A   Occupational History  . Retired    Social History Main Topics  . Smoking status: Former Smoker    Types: Cigarettes  . Smokeless tobacco: Never Used  . Alcohol Use: Yes     Comment: drinks beer weekly  . Drug Use: No  . Sexual Activity: Yes   Other Topics Concern  . Not on file   Social History Narrative   Married. Education: [employer].    Review of Systems  Constitutional: Negative for fatigue and unexpected weight change.  Eyes: Negative for visual disturbance.  Respiratory: Negative for cough, chest tightness and shortness of breath.   Cardiovascular: Negative for chest pain, palpitations and leg swelling.  Gastrointestinal: Negative for abdominal pain and blood in stool.  Neurological: Negative for dizziness, light-headedness and headaches.       Objective:   Physical Exam CONSTITUTIONAL: Well developed/well nourished HEAD: Normocephalic/atraumatic EYES: EOMI/PERRL ENMT: Mucous membranes moist NECK: supple no meningeal signs SPINE/BACK:entire spine nontender CV: S1/S2 noted, no  murmurs/rubs/gallops noted LUNGS: Lungs are clear to auscultation bilaterally, no apparent distress ABDOMEN: soft, nontender, no rebound or guarding, bowel sounds noted throughout abdomen there are healed portal sites present. GU:no cva tenderness. Prostate does not appear enlarged and there are no nodules. NEURO: Pt is awake/alert/appropriate, moves all extremitiesx4.  No facial droop.   EXTREMITIES: pulses normal/equal, full ROM SKIN: warm, color normal PSYCH: no abnormalities of mood noted, alert and oriented to situation       Assessment & Plan:  PSA was done today. His last  PSA seemed to be rising. He has recently been in a hospital with abdominal wall abscess and is getting better. Referral made to Dr. Dwyane Dee regarding his thyroid replacement. He states he has had a pneumonia vaccine but we could not document this. He is going to check with [facility] about this. I personally performed the services described in this documentation, which was scribed in my presence. The recorded information has been reviewed and is accurate.

## 2013-12-19 NOTE — Telephone Encounter (Signed)
Preston Reeves called to advise that he was given a pneumonia vaccine at [facility] approximately 5 years ago.

## 2013-12-19 NOTE — Telephone Encounter (Signed)
Call patient and let him know we will give him the Prevnar vaccine when he returns to clinic. I appreciate him checking this out for me.

## 2013-12-20 LAB — PSA, MEDICARE: PSA: 3.32 ng/mL (ref <=4.00)

## 2013-12-25 ENCOUNTER — Ambulatory Visit (INDEPENDENT_AMBULATORY_CARE_PROVIDER_SITE_OTHER): Payer: Commercial Managed Care - HMO | Admitting: Endocrinology

## 2013-12-25 ENCOUNTER — Encounter: Payer: Self-pay | Admitting: Endocrinology

## 2013-12-25 VITALS — BP 172/88 | HR 98 | Temp 97.9°F | Resp 14 | Ht 70.0 in | Wt 181.8 lb

## 2013-12-25 DIAGNOSIS — E890 Postprocedural hypothyroidism: Secondary | ICD-10-CM

## 2013-12-25 LAB — TSH: TSH: 4.69 u[IU]/mL — ABNORMAL HIGH (ref 0.35–4.50)

## 2013-12-25 LAB — T4, FREE: FREE T4: 1.16 ng/dL (ref 0.60–1.60)

## 2013-12-25 NOTE — Progress Notes (Signed)
Reason for Appointment:  Hypothyroidism, followup visit    History of Present Illness:   The hypothyroidism was first diagnosed in 2007 after radioactive iodine treatment for Graves' disease   The patient has been treated with  Levoxyl, generic levothyroxine and now brand name Synthroid The last visit was in 12 /14 and at that time his dose was continued unchanged, previously had required a higher dose He did have a TSH done with his PCP earlier this year which was normal   Patient has no complaints of unusual fatigue, cold sensitivity, dry skin, unusual weight gain      He has lost weight since his abdominal surgery The patient is taking the thyroid supplement very regularly in the morning before breakfast. He is taking calcium at night   Lab Results  Component Value Date   FREET4 1.28 12/26/2012   TSH 3.265 06/20/2013   TSH 1.60 12/26/2012   Wt Readings from Last 3 Encounters:  12/25/13 181 lb 12.8 oz (82.464 kg)  12/19/13 180 lb (81.647 kg)  11/30/13 184 lb (83.462 kg)      Medication List       This list is accurate as of: 12/25/13  9:13 AM.  Always use your most recent med list.               acetaminophen 500 MG tablet  Commonly known as:  TYLENOL  Take 100 mg by mouth every 8 (eight) hours as needed for fever.     amoxicillin-clavulanate 875-125 MG per tablet  Commonly known as:  AUGMENTIN  Take 1 tablet by mouth 2 (two) times daily.     aspirin 81 MG tablet  Take 81 mg by mouth 2 (two) times daily.     Biotin 10 MG Caps  Take 10 mg by mouth daily.     CALCIUM + D PO  Take 1 tablet by mouth daily.     CITRUCEL PO  Take 2 tablets by mouth 2 (two) times daily.     fexofenadine-pseudoephedrine 180-240 MG per 24 hr tablet  Commonly known as:  ALLEGRA-D 24  Take 1 tablet by mouth daily.     FISH OIL PO  Take 900 mg by mouth daily. Taking 900 daily     Glucosamine 500 MG Caps  Take 500 mg by mouth daily.     loratadine 10 MG tablet  Commonly known  as:  CLARITIN  Take 10 mg by mouth daily as needed for allergies. As needed     methocarbamol 750 MG tablet  Commonly known as:  ROBAXIN  Take 1 tablet (750 mg total) by mouth 4 (four) times daily as needed (use for muscle cramps/pain).     MULTIVITAMIN PO  Take 1 tablet by mouth daily.     oxyCODONE 5 MG immediate release tablet  Commonly known as:  Oxy IR/ROXICODONE  Take 1-2 tablets (5-10 mg total) by mouth every 4 (four) hours as needed for moderate pain, severe pain or breakthrough pain.     Red Yeast Rice 600 MG Caps  Take 1,200 mg by mouth daily.     SALINE NASAL SPRAY NA  Place 1 spray into the nose daily as needed (allergies).     SYNTHROID 137 MCG tablet  Generic drug:  levothyroxine  Take 1 tablet (137 mcg total) by mouth daily before breakfast.     vitamin B-12 1000 MCG tablet  Commonly known as:  CYANOCOBALAMIN  Take 1,000 mcg by mouth daily.  vitamin C 1000 MG tablet  Take 1,000 mg by mouth daily.     Vitamin D 2000 UNITS Caps  Take 2,000 Units by mouth daily.        Allergies: No Known Allergies  Past Medical History  Diagnosis Date  . MVP (mitral valve prolapse)   . Diverticulosis     hx of  . History of BPH   . Hemochromatosis     hx of  . History of COPD   . Thyroid disease   . Incisional hernia   . Hyperlipidemia   . Diverticulitis of colon with perforation 2010    Colectomy/ostomy  . Colon polyps     Adenomatous  . Allergy     SEASONAL  . Cataract   . Hypothyroidism     Past Surgical History  Procedure Laterality Date  . Inguinal hernia repair Left 1999    Dr. Rebekah Chesterfield  . Partial colectomy  08/12/2008    Jeanette Caprice  . Colostomy takedown  2010    Dr Barry Dienes  . Colonoscopy w/ biopsies  2011    Dr Henrene Pastor  . Colonoscopy    . Upper gi endoscopy    . Ventral hernia repair N/A 10/13/2013    Procedure: REPAIR OF VENTRAL WALL HERNIA WITH UNILATERAL COMPARTMENT SEPARATION;  Surgeon: Michael Boston, MD;  Location: [place];  Service: General;   Laterality: N/A;  . Insertion of mesh N/A 10/13/2013    Procedure: INSERTION OF MESH;  Surgeon: Michael Boston, MD;  Location: [place];  Service: General;  Laterality: N/A;  . Laparoscopic lysis of adhesions N/A 10/13/2013    Procedure: LAPAROSCOPIC LYSIS OF ADHESIONS;  Surgeon: Michael Boston, MD;  Location: [facility] OR;  Service: General;  Laterality: N/A;    Family History  Problem Relation Age of Onset  . Breast cancer Mother   . Colon cancer Father   . Heart disease Father   . Diabetes Father     Social History:  reports that he has quit smoking. His smoking use included Cigarettes. He smoked 0.00 packs per day. He has never used smokeless tobacco. He reports that he drinks alcohol. He reports that he does not use illicit drugs.  REVIEW Of SYSTEMS:  He has regular exams with his PCP   He appears to have relatively high blood pressure readings but he thinks this is not usual   Examination:   BP 172/88 mmHg  Pulse 98  Temp(Src) 97.9 F (36.6 C)  Resp 14  Ht 5\' 10"  (1.778 m)  Wt 181 lb 12.8 oz (82.464 kg)  BMI 26.09 kg/m2  SpO2 96%  Looks well, no puffiness of the face orhands No dry skin Thyroid not palpable Biceps reflexes appear normal    Assessment   Hypothyroidism, post ablative treated with brand name Synthroid 137 g Clinically looks euthyroid We will check his TSH today and adjust dose as needed  ?  Hypertension   Treatment:  The dose will be decided based on his labs today  Follow-up with PCP for blood pressure   Follow up in one year If the doses continued unchanged  Dimitri Shakespeare 12/25/2013, 9:13 AM

## 2013-12-25 NOTE — Progress Notes (Signed)
Quick Note:  Please let patient know that the thyroid result is slightly low and needs to change to 150 g Synthroid brand name Follow-up in 4 months with labs  ______

## 2013-12-26 ENCOUNTER — Other Ambulatory Visit: Payer: Self-pay | Admitting: *Deleted

## 2013-12-26 MED ORDER — SYNTHROID 150 MCG PO TABS: 150.0000 ug | ORAL_TABLET | Freq: Every day | ORAL | Status: DC

## 2013-12-28 ENCOUNTER — Ambulatory Visit: Payer: Commercial Managed Care - HMO | Admitting: Emergency Medicine

## 2014-01-01 ENCOUNTER — Other Ambulatory Visit (HOSPITAL_BASED_OUTPATIENT_CLINIC_OR_DEPARTMENT_OTHER): Payer: Commercial Managed Care - HMO | Admitting: Lab

## 2014-01-01 ENCOUNTER — Encounter: Payer: Self-pay | Admitting: Hematology & Oncology

## 2014-01-01 ENCOUNTER — Ambulatory Visit (HOSPITAL_BASED_OUTPATIENT_CLINIC_OR_DEPARTMENT_OTHER): Payer: Commercial Managed Care - HMO | Admitting: Hematology & Oncology

## 2014-01-01 DIAGNOSIS — I10 Essential (primary) hypertension: Secondary | ICD-10-CM

## 2014-01-01 LAB — CBC WITH DIFFERENTIAL (CANCER CENTER ONLY)
BASO#: 0 10*3/uL (ref 0.0–0.2)
BASO%: 0.5 % (ref 0.0–2.0)
EOS%: 7.3 % — ABNORMAL HIGH (ref 0.0–7.0)
Eosinophils Absolute: 0.3 10*3/uL (ref 0.0–0.5)
HCT: 36.3 % — ABNORMAL LOW (ref 38.7–49.9)
HGB: 11.9 g/dL — ABNORMAL LOW (ref 13.0–17.1)
LYMPH#: 1 10*3/uL (ref 0.9–3.3)
LYMPH%: 23.2 % (ref 14.0–48.0)
MCH: 29.8 pg (ref 28.0–33.4)
MCHC: 32.8 g/dL (ref 32.0–35.9)
MCV: 91 fL (ref 82–98)
MONO#: 0.6 10*3/uL (ref 0.1–0.9)
MONO%: 13.7 % — ABNORMAL HIGH (ref 0.0–13.0)
NEUT#: 2.3 10*3/uL (ref 1.5–6.5)
NEUT%: 55.3 % (ref 40.0–80.0)
PLATELETS: 228 10*3/uL (ref 145–400)
RBC: 4 10*6/uL — ABNORMAL LOW (ref 4.20–5.70)
RDW: 14.9 % (ref 11.1–15.7)
WBC: 4.2 10*3/uL (ref 4.0–10.0)

## 2014-01-01 LAB — FERRITIN CHCC: Ferritin: 47 ng/ml (ref 22–316)

## 2014-01-01 LAB — IRON AND TIBC CHCC
%SAT: 40 % (ref 20–55)
IRON: 92 ug/dL (ref 42–163)
TIBC: 228 ug/dL (ref 202–409)
UIBC: 136 ug/dL (ref 117–376)

## 2014-01-01 NOTE — Progress Notes (Signed)
[place]  [phone] [phone]  ID: Preston Reeves OB: 10-22-1940 MR#: [id number] WER#:[id number] Patient Care Team: Darlyne Russian, MD as PCP - General (Family Medicine) Raynelle Bring, MD as Consulting Physician (Urology) Irene Shipper, MD as Consulting Physician (Gastroenterology) Darlin Coco, MD as Consulting Physician (Cardiology)  DIAGNOSIS: Hemochromatosis  INTERVAL HISTORY: Preston Reeves is here today for a follow-up. He is doing good. He had multiple hernias repaired in September and is still recovering from that. He is hypertensive today. He states that he has been running a little high since his surgery and that he was also nervous about coming in today.  He has not donated blood in a year.  He denies fever, chills, n/v, cough, rash, headache, dizziness, SOB, chest pain, palpitations, abdominal pain, constipation, diarrhea, blood in urine or stool.  No swelling, tenderness, numbness or tingling in his extremities.  His appetite is good and he states that he is drinking lots of water.   CURRENT TREATMENT: Phlebotomy to maintain ferritin less than 100  REVIEW OF SYSTEMS: All other 10 point review of systems is negative.   PAST MEDICAL HISTORY: Past Medical History  Diagnosis Date  . MVP (mitral valve prolapse)   . Diverticulosis     hx of  . History of BPH   . Hemochromatosis     hx of  . History of COPD   . Thyroid disease   . Incisional hernia   . Hyperlipidemia   . Diverticulitis of colon with perforation 2010    Colectomy/ostomy  . Colon polyps     Adenomatous  . Allergy     SEASONAL  . Cataract   . Hypothyroidism     PAST SURGICAL HISTORY: Past Surgical History  Procedure Laterality Date  . Inguinal hernia repair Left 1999    Dr. Rebekah Chesterfield  . Partial colectomy  08/12/2008    Jeanette Caprice  . Colostomy takedown  2010    Dr Barry Dienes  . Colonoscopy w/ biopsies  2011    Dr Henrene Pastor  . Colonoscopy    . Upper gi endoscopy    .  Ventral hernia repair N/A 10/13/2013    Procedure: REPAIR OF VENTRAL WALL HERNIA WITH UNILATERAL COMPARTMENT SEPARATION;  Surgeon: Michael Boston, MD;  Location: [place];  Service: General;  Laterality: N/A;  . Insertion of mesh N/A 10/13/2013    Procedure: INSERTION OF MESH;  Surgeon: Michael Boston, MD;  Location: [place];  Service: General;  Laterality: N/A;  . Laparoscopic lysis of adhesions N/A 10/13/2013    Procedure: LAPAROSCOPIC LYSIS OF ADHESIONS;  Surgeon: Michael Boston, MD;  Location: [facility] OR;  Service: General;  Laterality: N/A;    FAMILY HISTORY Family History  Problem Relation Age of Onset  . Breast cancer Mother   . Colon cancer Father   . Heart disease Father   . Diabetes Father     GYNECOLOGIC HISTORY:  No LMP for male patient.   SOCIAL HISTORY: History   Social History  . Marital Status: Married    Spouse Name: N/A    Number of Children: N/A  . Years of Education: N/A   Occupational History  . Retired    Social History Main Topics  . Smoking status: Former Smoker    Types: Cigarettes  . Smokeless tobacco: Never Used  . Alcohol Use: Yes     Comment: drinks beer weekly  . Drug Use: No  . Sexual Activity: Yes   Other Topics Concern  . Not  on file   Social History Narrative   Married. Education: [employer].    ADVANCED DIRECTIVES:  <no information>  HEALTH MAINTENANCE: History  Substance Use Topics  . Smoking status: Former Smoker    Types: Cigarettes  . Smokeless tobacco: Never Used  . Alcohol Use: Yes     Comment: drinks beer weekly   Colonoscopy: PAP: Bone density: Lipid panel:  No Known Allergies  Current Outpatient Prescriptions  Medication Sig Dispense Refill  . acetaminophen (TYLENOL) 500 MG tablet Take 100 mg by mouth every 8 (eight) hours as needed for fever.    Marland Kitchen amoxicillin-clavulanate (AUGMENTIN) 875-125 MG per tablet Take 1 tablet by mouth 2 (two) times daily. 28 tablet 1  . Ascorbic Acid (VITAMIN C) 1000 MG tablet Take 1,000 mg by  mouth daily.      Marland Kitchen aspirin 81 MG tablet Take 81 mg by mouth 2 (two) times daily.     . Biotin 10 MG CAPS Take 10 mg by mouth daily.     . Calcium Carbonate-Vitamin D (CALCIUM + D PO) Take 1 tablet by mouth daily.     . Cholecalciferol (VITAMIN D) 2000 UNITS CAPS Take 2,000 Units by mouth daily.     . fexofenadine-pseudoephedrine (ALLEGRA-D 24) 180-240 MG per 24 hr tablet Take 1 tablet by mouth daily.    . Glucosamine 500 MG CAPS Take 500 mg by mouth daily.     Marland Kitchen loratadine (CLARITIN) 10 MG tablet Take 10 mg by mouth daily as needed for allergies. As needed    . methocarbamol (ROBAXIN) 750 MG tablet Take 1 tablet (750 mg total) by mouth 4 (four) times daily as needed (use for muscle cramps/pain). 30 tablet 2  . Methylcellulose, Laxative, (CITRUCEL PO) Take 2 tablets by mouth 2 (two) times daily.     . Multiple Vitamins-Minerals (MULTIVITAMIN PO) Take 1 tablet by mouth daily.     . Omega-3 Fatty Acids (FISH OIL PO) Take 900 mg by mouth daily. Taking 900 daily    . oxyCODONE (OXY IR/ROXICODONE) 5 MG immediate release tablet Take 1-2 tablets (5-10 mg total) by mouth every 4 (four) hours as needed for moderate pain, severe pain or breakthrough pain. 50 tablet 0  . Red Yeast Rice 600 MG CAPS Take 1,200 mg by mouth daily.     Marland Kitchen SALINE NASAL SPRAY NA Place 1 spray into the nose daily as needed (allergies).     . SYNTHROID 150 MCG tablet Take 1 tablet (150 mcg total) by mouth daily before breakfast. 90 tablet 0  . vitamin B-12 (CYANOCOBALAMIN) 1000 MCG tablet Take 1,000 mcg by mouth daily.     No current facility-administered medications for this visit.    OBJECTIVE: Filed Vitals:   01/01/14 0830  BP: 183/83  Pulse: 89  Temp: 98.5 F (36.9 C)  Resp: 18    Filed Weights   01/01/14 0830  Weight: 181 lb (82.101 kg)   ECOG FS:0 - Asymptomatic Ocular: Sclerae unicteric, pupils equal, round and reactive to light Ear-nose-throat: Oropharynx clear, dentition fair Lymphatic: No cervical or  supraclavicular adenopathy Lungs no rales or rhonchi, good excursion bilaterally Heart regular rate and rhythm, no murmur appreciated Abd soft, nontender, positive bowel sounds MSK no focal spinal tenderness, no joint edema Neuro: non-focal, well-oriented, appropriate affect  LAB RESULTS: CMP     Component Value Date/Time   NA 137 12/04/2013 0504   K 3.9 12/04/2013 0504   CL 98 12/04/2013 0504   CO2 28 12/04/2013 0504  GLUCOSE 105* 12/04/2013 0504   BUN 10 12/04/2013 0504   CREATININE 1.06 12/04/2013 0504   CREATININE 1.01 06/20/2013 1408   CALCIUM 9.2 12/04/2013 0504   PROT 8.7* 11/30/2013 1924   ALBUMIN 2.9* 11/30/2013 1924   AST 24 11/30/2013 1924   ALT 18 11/30/2013 1924   ALKPHOS 101 11/30/2013 1924   BILITOT 0.3 11/30/2013 1924   GFRNONAA 68* 12/04/2013 0504   GFRNONAA 73 06/20/2013 1408   GFRAA 78* 12/04/2013 0504   GFRAA 85 06/20/2013 1408   INo results found for: SPEP, UPEP Lab Results  Component Value Date   WBC 4.2 01/01/2014   NEUTROABS 2.3 01/01/2014   HGB 11.9* 01/01/2014   HCT 36.3* 01/01/2014   MCV 91 01/01/2014   PLT 228 01/01/2014   No results found for: LABCA2 No components found for: LABCA125 No results for input(s): INR in the last 168 hours.  STUDIES: No results found.  ASSESSMENT/PLAN: Preston Reeves is a very nice, 73 year old gentleman with hemochromatosis.He is feeling ok and is asymptomatic at this time.  His Hgb is 11.9 MCV 36.3. We will see what his iron studies show.  If he needs phlebotomized he will go to the [facility].  We will see him back for follow-up and labs in 1 year.  He knows to call here with any questions or concerns and to go to the ED in the event of an emergency. We can certainly see him sooner.   Eliezer Bottom, NP 01/01/2014 9:04 AM

## 2014-01-02 ENCOUNTER — Telehealth: Payer: Self-pay | Admitting: *Deleted

## 2014-01-02 NOTE — Telephone Encounter (Addendum)
-----   Message from [name], MD sent at 01/01/2014 12:03 PM EST ----- Please call and let him know that the ferritin is 47. This is still okay not to need a phlebotomy. However, I suspect that next year, he likely will need to have one done. Pete -LVM letting pt know that the ferritin is 47. This is still okay not to need a phlebotomy. However, I suspect that next year, he likely will need to have one done

## 2014-01-08 ENCOUNTER — Ambulatory Visit (INDEPENDENT_AMBULATORY_CARE_PROVIDER_SITE_OTHER): Payer: Commercial Managed Care - HMO | Admitting: Cardiology

## 2014-01-08 ENCOUNTER — Encounter: Payer: Self-pay | Admitting: Cardiology

## 2014-01-08 VITALS — BP 128/78 | HR 95 | Ht 71.0 in | Wt 181.0 lb

## 2014-01-08 DIAGNOSIS — E7800 Pure hypercholesterolemia, unspecified: Secondary | ICD-10-CM

## 2014-01-08 DIAGNOSIS — I341 Nonrheumatic mitral (valve) prolapse: Secondary | ICD-10-CM

## 2014-01-08 DIAGNOSIS — R0609 Other forms of dyspnea: Secondary | ICD-10-CM

## 2014-01-08 NOTE — Assessment & Plan Note (Signed)
The patient has mild chronic exertional dyspnea.  He quit smoking cigarettes when he retired.  He was never a heavy smoker

## 2014-01-08 NOTE — Assessment & Plan Note (Signed)
The patient's hemochromatosis has been stable.

## 2014-01-08 NOTE — Patient Instructions (Signed)
Your physician recommends that you continue on your current medications as directed. Please refer to the Current Medication list given to you today.  Your physician wants you to follow-up in: EKG You will receive a reminder letter in the mail two months in advance. If you don't receive a letter, please call our office to schedule the follow-up appointment.

## 2014-01-08 NOTE — Assessment & Plan Note (Signed)
The patient has not been aware of any racing of his heart or palpitations

## 2014-01-08 NOTE — Progress Notes (Signed)
Preston Reeves Date of Birth:  [birth year] [place] [address] [place], [place]  [address] [phone]        Fax   [phone]   History of Present Illness: This pleasant 73 year old gentleman is seen for a one-year followup office visit.  He has a past history of thyrotoxicosis.  He is followed by Dr. Dwyane Dee.  He has a history of hemochromatosis.  He has a history of mitral valve prolapse.  Since last visit he has had a lot of gastrointestinal problems.  In 2005 he had surgery for a ruptured diverticular abscess.  At that time he had a temporary colostomy which was subsequently closed.  Over the past 10 years he had developed multiple abdominal hernias.  He went to surgery on 10/13/13 by Dr. Johney Maine.  Laparoscopic surgery was not possible and he had an open procedure.  He has had some problems with delayed wound closure.  Finally he is starting to turn the corner.  He has been off antibiotics now for 2 weeks with no recurrent pain or fever.  He is starting to feel stronger.  He is looking forward to his annual trip to [place] in January.   Current Outpatient Prescriptions  Medication Sig Dispense Refill  . acetaminophen (TYLENOL) 500 MG tablet Take 100 mg by mouth every 8 (eight) hours as needed for fever.    Marland Kitchen amoxicillin-clavulanate (AUGMENTIN) 875-125 MG per tablet Take 1 tablet by mouth 2 (two) times daily. 28 tablet 1  . Ascorbic Acid (VITAMIN C) 1000 MG tablet Take 1,000 mg by mouth daily.      Marland Kitchen aspirin 81 MG tablet Take 81 mg by mouth 2 (two) times daily.     . Biotin 10 MG CAPS Take 10 mg by mouth daily.     . Calcium Carbonate-Vitamin D (CALCIUM + D PO) Take 1 tablet by mouth daily.     . Cholecalciferol (VITAMIN D) 2000 UNITS CAPS Take 2,000 Units by mouth daily.     . fexofenadine-pseudoephedrine (ALLEGRA-D 24) 180-240 MG per 24 hr tablet Take 1 tablet by mouth daily.    . Glucosamine 500 MG CAPS Take 500 mg by mouth daily.     Marland Kitchen loratadine (CLARITIN)  10 MG tablet Take 10 mg by mouth daily as needed for allergies. As needed    . Methylcellulose, Laxative, (CITRUCEL PO) Take 2 tablets by mouth 2 (two) times daily.     . Multiple Vitamins-Minerals (MULTIVITAMIN PO) Take 1 tablet by mouth daily.     . Omega-3 Fatty Acids (FISH OIL PO) Take 900 mg by mouth daily. Taking 900 daily    . Red Yeast Rice 600 MG CAPS Take 1,200 mg by mouth daily.     Marland Kitchen SALINE NASAL SPRAY NA Place 1 spray into the nose daily as needed (allergies).     . SYNTHROID 150 MCG tablet Take 1 tablet (150 mcg total) by mouth daily before breakfast. 90 tablet 0  . vitamin B-12 (CYANOCOBALAMIN) 1000 MCG tablet Take 1,000 mcg by mouth daily.     No current facility-administered medications for this visit.    No Known Allergies  Patient Active Problem List   Diagnosis Date Noted  . Dyspnea on exertion 06/16/2010    Priority: High  . MVP (mitral valve prolapse)     Priority: Medium  . Hemochromatosis     Priority: Medium  . Infected postoperative seroma 11/30/2013  . Ventral hernia 10/16/2013  . Incisional hernias  s/p lap/open [place] repair w mesh 06/20/2013  . Other postablative hypothyroidism 12/28/2012  . Diverticulosis   . History of BPH   . COPD (chronic obstructive pulmonary disease)     History  Smoking status  . Former Smoker  . Types: Cigarettes  Smokeless tobacco  . Never Used    History  Alcohol Use  . Yes    Comment: drinks beer weekly    Family History  Problem Relation Age of Onset  . Breast cancer Mother   . Colon cancer Father   . Heart disease Father   . Diabetes Father     Review of Systems: Constitutional: no fever chills diaphoresis or fatigue or change in weight.  Head and neck: no hearing loss, no epistaxis, no photophobia or visual disturbance. Respiratory: No cough, shortness of breath or wheezing. Cardiovascular: No chest pain peripheral edema, palpitations. Gastrointestinal: No abdominal distention, no abdominal pain, no  change in bowel habits hematochezia or melena. Genitourinary: No dysuria, no frequency, no urgency, no nocturia. Musculoskeletal:No arthralgias, no back pain, no gait disturbance or myalgias. Neurological: No dizziness, no headaches, no numbness, no seizures, no syncope, no weakness, no tremors. Hematologic: No lymphadenopathy, no easy bruising. Psychiatric: No confusion, no hallucinations, no sleep disturbance.    Physical Exam: Filed Vitals:   01/08/14 1507  BP: 128/78  Pulse: 95   The general appearance reveals a well-developed well-nourished gentleman in no distress.The head and neck exam reveals pupils equal and reactive.  Extraocular movements are full.  There is no scleral icterus.  The mouth and pharynx are normal.  The neck is supple.  The carotids reveal no bruits.  The jugular venous pressure is normal.  The  thyroid is not enlarged.  There is no lymphadenopathy.  The chest is clear to percussion and auscultation.  There are no rales or rhonchi.  Expansion of the chest is symmetrical.  The precordium is quiet.  The first heart sound is normal.  The second heart sound is physiologically split.  There is no murmur gallop rub.  There is a faint apical click.  There is no abnormal lift or heave.  The abdomen is soft and nontender.  The bowel sounds are normal.  The liver and spleen are not enlarged.  There are no abdominal masses.  There are no abdominal bruits.  Extremities reveal good pedal pulses.  There is no phlebitis or edema.  There is no cyanosis or clubbing.  Strength is normal and symmetrical in all extremities.  There is no lateralizing weakness.  There are no sensory deficits.  The skin is warm and dry.  There is no rash.    Assessment / Plan: 1.  Mitral valve prolapse 2. Hypercholesterolemia 3. incisional abdominal hernia, recovering from surgery done on 10/13/13 by Dr. Johney Maine 4. history of hemachromatosis 5. history of post-ablative hypothyroidism, on Synthroid, followed by  Dr. Dwyane Dee  Plan: Continue same medication.  Recheck in one year for office visit and EKG

## 2014-03-28 ENCOUNTER — Telehealth: Payer: Self-pay | Admitting: Endocrinology

## 2014-03-28 ENCOUNTER — Other Ambulatory Visit: Payer: Self-pay | Admitting: Endocrinology

## 2014-03-28 ENCOUNTER — Other Ambulatory Visit: Payer: Self-pay | Admitting: *Deleted

## 2014-03-28 MED ORDER — SYNTHROID 150 MCG PO TABS: 150.0000 ug | ORAL_TABLET | Freq: Every day | ORAL | Status: DC

## 2014-03-28 NOTE — Telephone Encounter (Signed)
Patient would like a refill on his synthroid  [place]   Pharmacy: [facility]   Call back: [phone]   Thank you

## 2014-03-28 NOTE — Telephone Encounter (Signed)
rx sent

## 2014-04-30 ENCOUNTER — Other Ambulatory Visit (INDEPENDENT_AMBULATORY_CARE_PROVIDER_SITE_OTHER): Payer: Commercial Managed Care - HMO

## 2014-04-30 ENCOUNTER — Other Ambulatory Visit: Payer: Self-pay | Admitting: *Deleted

## 2014-04-30 DIAGNOSIS — E890 Postprocedural hypothyroidism: Secondary | ICD-10-CM

## 2014-04-30 LAB — TSH: TSH: 1.21 u[IU]/mL (ref 0.35–4.50)

## 2014-04-30 LAB — T4, FREE: Free T4: 1.22 ng/dL (ref 0.60–1.60)

## 2014-05-01 ENCOUNTER — Other Ambulatory Visit: Payer: Commercial Managed Care - HMO

## 2014-05-04 ENCOUNTER — Ambulatory Visit (INDEPENDENT_AMBULATORY_CARE_PROVIDER_SITE_OTHER): Payer: Commercial Managed Care - HMO | Admitting: Endocrinology

## 2014-05-04 ENCOUNTER — Encounter: Payer: Self-pay | Admitting: Endocrinology

## 2014-05-04 VITALS — BP 160/80 | HR 82 | Temp 98.0°F | Wt 184.0 lb

## 2014-05-04 DIAGNOSIS — E890 Postprocedural hypothyroidism: Secondary | ICD-10-CM

## 2014-05-04 MED ORDER — SYNTHROID 150 MCG PO TABS: 150.0000 ug | ORAL_TABLET | Freq: Every day | ORAL | Status: DC

## 2014-05-04 NOTE — Progress Notes (Signed)
Reason for Appointment:  Hypothyroidism, followup visit    History of Present Illness:   The hypothyroidism was first diagnosed in 2007 after radioactive iodine treatment for Graves' disease   The patient has been treated with  Levoxyl, generic levothyroxine and now brand name Synthroid The last visit was in 11/15 and at that time his TSH was slightly increased at 4.7 and his levothyroxine dose was increased to 150 g He thinks he may have felt a little less fatigue with this change   Patient has no complaints of unusual fatigue currently and overall feels good, has regained a little more weight      The patient is taking the thyroid supplement very regularly in the morning before breakfast. He is taking calcium at night  TSH is now back to normal  Lab Results  Component Value Date   FREET4 1.22 04/30/2014   FREET4 1.16 12/25/2013   FREET4 1.28 12/26/2012   TSH 1.21 04/30/2014   TSH 4.69* 12/25/2013   TSH 3.265 06/20/2013   Wt Readings from Last 3 Encounters:  05/04/14 184 lb (83.462 kg)  01/08/14 181 lb (82.101 kg)  01/01/14 181 lb (82.101 kg)      Medication List       This list is accurate as of: 05/04/14 11:16 AM.  Always use your most recent med list.               acetaminophen 500 MG tablet  Commonly known as:  TYLENOL  Take 100 mg by mouth every 8 (eight) hours as needed for fever.     amoxicillin-clavulanate 875-125 MG per tablet  Commonly known as:  AUGMENTIN  Take 1 tablet by mouth 2 (two) times daily.     aspirin 81 MG tablet  Take 81 mg by mouth 2 (two) times daily.     Biotin 10 MG Caps  Take 10 mg by mouth daily.     CALCIUM + D PO  Take 1 tablet by mouth daily.     CITRUCEL PO  Take 2 tablets by mouth 2 (two) times daily.     fexofenadine-pseudoephedrine 180-240 MG per 24 hr tablet  Commonly known as:  ALLEGRA-D 24  Take 1 tablet by mouth daily.     FISH OIL PO  Take 900 mg by mouth daily. Taking 900 daily     Glucosamine  500 MG Caps  Take 500 mg by mouth daily.     loratadine 10 MG tablet  Commonly known as:  CLARITIN  Take 10 mg by mouth daily as needed for allergies. As needed     MULTIVITAMIN PO  Take 1 tablet by mouth daily.     Red Yeast Rice 600 MG Caps  Take 1,200 mg by mouth daily.     SALINE NASAL SPRAY NA  Place 1 spray into the nose daily as needed (allergies).     SYNTHROID 150 MCG tablet  Generic drug:  levothyroxine  Take 1 tablet (150 mcg total) by mouth daily before breakfast.     vitamin B-12 1000 MCG tablet  Commonly known as:  CYANOCOBALAMIN  Take 1,000 mcg by mouth daily.     vitamin C 1000 MG tablet  Take 1,000 mg by mouth daily.     Vitamin D 2000 UNITS Caps  Take 2,000 Units by mouth daily.        Allergies: No Known Allergies  Past Medical History  Diagnosis Date  . MVP (mitral valve prolapse)   .  Diverticulosis     hx of  . History of BPH   . Hemochromatosis     hx of  . History of COPD   . Thyroid disease   . Incisional hernia   . Hyperlipidemia   . Diverticulitis of colon with perforation 2010    Colectomy/ostomy  . Colon polyps     Adenomatous  . Allergy     SEASONAL  . Cataract   . Hypothyroidism     Past Surgical History  Procedure Laterality Date  . Inguinal hernia repair Left 1999    Dr. Rebekah Chesterfield  . Partial colectomy  08/12/2008    Jeanette Caprice  . Colostomy takedown  2010    Dr Barry Dienes  . Colonoscopy w/ biopsies  2011    Dr Henrene Pastor  . Colonoscopy    . Upper gi endoscopy    . Ventral hernia repair N/A 10/13/2013    Procedure: REPAIR OF VENTRAL WALL HERNIA WITH UNILATERAL COMPARTMENT SEPARATION;  Surgeon: Michael Boston, MD;  Location: [facility];  Service: General;  Laterality: N/A;  . Insertion of mesh N/A 10/13/2013    Procedure: INSERTION OF MESH;  Surgeon: Michael Boston, MD;  Location: [place];  Service: General;  Laterality: N/A;  . Laparoscopic lysis of adhesions N/A 10/13/2013    Procedure: LAPAROSCOPIC LYSIS OF ADHESIONS;  Surgeon: Michael Boston,  MD;  Location[facility];  Service: General;  Laterality: N/A;    Family History  Problem Relation Age of Onset  . Breast cancer Mother   . Colon cancer Father   . Heart disease Father   . Diabetes Father     Social History:  reports that he has quit smoking. His smoking use included Cigarettes. He has never used smokeless tobacco. He reports that he drinks alcohol. He reports that he does not use illicit drugs.  REVIEW Of SYSTEMS:  Review of Systems  Constitutional: Negative for weight loss.    He appears to have relatively high blood pressure readings in the office but otherwise blood pressure appears to be normal   Examination:   BP 160/80 mmHg  Pulse 82  Temp(Src) 98 F (36.7 C)  Wt 184 lb (83.462 kg)  SpO2 96%  Looks well Thyroid not palpable Biceps reflexes appear normal No swelling of the hands or eyelid edema    Assessment   Hypothyroidism, post ablative treated with brand name Synthroid 150 g Clinically looks euthyroid and his TSH is now quite normal after the change in dose in 11/15    Treatment:  The dose will be continued unchanged He will call if he has any unusual fatigue and continue to take the medication before breakfast Follow up at the end of the year  [facility] 05/04/2014, 11:16 AM

## 2014-05-30 ENCOUNTER — Telehealth: Payer: Self-pay | Admitting: Hematology & Oncology

## 2014-05-30 NOTE — Telephone Encounter (Signed)
Preston Reeves: [id number] Requesting Provider: Dr. [name] Treating Provider: Dr. Burney Gauze Visits: 6 Status: Approved Dates: 05/30/2014 - 11/26/2014      COPY SCANNED

## 2014-06-07 ENCOUNTER — Ambulatory Visit (INDEPENDENT_AMBULATORY_CARE_PROVIDER_SITE_OTHER): Payer: Commercial Managed Care - HMO

## 2014-06-07 ENCOUNTER — Encounter: Payer: Self-pay | Admitting: Emergency Medicine

## 2014-06-07 ENCOUNTER — Other Ambulatory Visit: Payer: Self-pay | Admitting: Emergency Medicine

## 2014-06-07 ENCOUNTER — Ambulatory Visit (INDEPENDENT_AMBULATORY_CARE_PROVIDER_SITE_OTHER): Payer: Commercial Managed Care - HMO | Admitting: Emergency Medicine

## 2014-06-07 VITALS — BP 148/80 | HR 77 | Temp 97.6°F | Resp 16 | Ht 70.0 in | Wt 178.6 lb

## 2014-06-07 DIAGNOSIS — M25511 Pain in right shoulder: Secondary | ICD-10-CM

## 2014-06-07 DIAGNOSIS — Z1211 Encounter for screening for malignant neoplasm of colon: Secondary | ICD-10-CM | POA: Diagnosis not present

## 2014-06-07 DIAGNOSIS — Z0000 Encounter for general adult medical examination without abnormal findings: Secondary | ICD-10-CM

## 2014-06-07 DIAGNOSIS — H6123 Impacted cerumen, bilateral: Secondary | ICD-10-CM

## 2014-06-07 DIAGNOSIS — R739 Hyperglycemia, unspecified: Secondary | ICD-10-CM | POA: Diagnosis not present

## 2014-06-07 DIAGNOSIS — Z23 Encounter for immunization: Secondary | ICD-10-CM | POA: Diagnosis not present

## 2014-06-07 DIAGNOSIS — H6120 Impacted cerumen, unspecified ear: Secondary | ICD-10-CM

## 2014-06-07 DIAGNOSIS — R972 Elevated prostate specific antigen [PSA]: Secondary | ICD-10-CM

## 2014-06-07 DIAGNOSIS — E785 Hyperlipidemia, unspecified: Secondary | ICD-10-CM | POA: Diagnosis not present

## 2014-06-07 LAB — IFOBT (OCCULT BLOOD): IMMUNOLOGICAL FECAL OCCULT BLOOD TEST: NEGATIVE

## 2014-06-07 LAB — POCT GLYCOSYLATED HEMOGLOBIN (HGB A1C): HEMOGLOBIN A1C: 5.6

## 2014-06-07 NOTE — Progress Notes (Signed)
Subjective:  This chart was scribed for Darlyne Russian, MD by Ladene Artist, ED Scribe. The patient was seen in room 22. Patient's care was started at 8:34 AM.   Patient ID: Preston Reeves, male    DOB: 1940-10-12, 74 y.o.   MRN: [id number]  Chief Complaint  Patient presents with   Annual Exam    AWV-S   HPI HPI Comments: Preston Reeves is a 74 y.o. male, with a h/o MVP, COPD, thyroid disease, hypothyroidism, hyperlipidemia, diverticulitis, who presents to the [facility] for an annual exam. Pt's last CPE was approximately 1 year ago.    PSA Pt's PSA was 3.5 11 months ago, 3.32 5 months ago.   Constipation Pt takes a few stool softeners daily. He has also tried fiber without relief. Pt has noticed that constipation is more severe after golfing. He has not tried a bulking agent such as Metamucil or Citrucel. His colonoscopy is UTD.  Hernia Pt denies any concerns with ventral hernia at this time.   Hemochromatosis  Pt was last seen by oncologist Burney Gauze on 01/01/14 for hemochromatosis.   Ophthalmology  Pt was seen last week by Dr. Idolina Primer. He has a follow-up appointment with him.   ENT Pt reports h/o cerumen impactions. He has an upcoming appointment with Dr. Clotilde Dieter in June.   Immunizations  Pt received his pneumovax vacicne 3-4 years ago. He has received his shingles vaccine. Pt also reports receiving an unknown vaccine last year.   Past Medical History  Diagnosis Date   MVP (mitral valve prolapse)    Diverticulosis     hx of   History of BPH    Hemochromatosis     hx of   History of COPD    Thyroid disease    Incisional hernia    Hyperlipidemia    Diverticulitis of colon with perforation 2010    Colectomy/ostomy   Colon polyps     Adenomatous   Allergy     SEASONAL   Cataract    Hypothyroidism    Current Outpatient Prescriptions on File Prior to Visit  Medication Sig Dispense Refill   acetaminophen (TYLENOL) 500 MG tablet  Take 100 mg by mouth every 8 (eight) hours as needed for fever.     Ascorbic Acid (VITAMIN C) 1000 MG tablet Take 1,000 mg by mouth daily.       aspirin 81 MG tablet Take 81 mg by mouth 2 (two) times daily.      Biotin 10 MG CAPS Take 10 mg by mouth daily.      Calcium Carbonate-Vitamin D (CALCIUM + D PO) Take 1 tablet by mouth daily.      Cholecalciferol (VITAMIN D) 2000 UNITS CAPS Take 2,000 Units by mouth daily.      fexofenadine-pseudoephedrine (ALLEGRA-D 24) 180-240 MG per 24 hr tablet Take 1 tablet by mouth daily.     Glucosamine 500 MG CAPS Take 500 mg by mouth daily.      loratadine (CLARITIN) 10 MG tablet Take 10 mg by mouth daily as needed for allergies. As needed     Methylcellulose, Laxative, (CITRUCEL PO) Take 2 tablets by mouth 2 (two) times daily.      Multiple Vitamins-Minerals (MULTIVITAMIN PO) Take 1 tablet by mouth daily.      Omega-3 Fatty Acids (FISH OIL PO) Take 900 mg by mouth daily. Taking 900 daily     Red Yeast Rice 600 MG CAPS Take 1,200 mg by mouth  daily.      SALINE NASAL SPRAY NA Place 1 spray into the nose daily as needed (allergies).      SYNTHROID 150 MCG tablet Take 1 tablet (150 mcg total) by mouth daily before breakfast. 90 tablet 3   vitamin B-12 (CYANOCOBALAMIN) 1000 MCG tablet Take 1,000 mcg by mouth daily.     No current facility-administered medications on file prior to visit.   Not on File  Review of Systems  Constitutional: Negative for fever and chills.  Gastrointestinal: Positive for constipation.   BP 148/80 mmHg   Pulse 77   Temp(Src) 97.6 F (36.4 C) (Oral)   Resp 16   Ht 5\' 10"  (1.778 m)   Wt 178 lb 9.6 oz (81.012 kg)   BMI 25.63 kg/m2   SpO2 94%    Objective:   Physical Exam  Constitutional: He is oriented to person, place, and time. He appears well-developed and well-nourished. No distress.  HENT:  Head: Normocephalic and atraumatic.  Cerumen impaction in both ears.   Eyes: Conjunctivae and EOM are normal.  Neck:  Neck supple. No tracheal deviation present.  Cardiovascular: Normal rate.   Pulmonary/Chest: Effort normal. No respiratory distress.  Abdominal:  Scar which extends vertical down entire abdomen. There is another scar to the L mid abdomen approximately 3 inches in length. There are staples present underneath skin on the R side of abdomen.   Musculoskeletal: Normal range of motion.  Neurological: He is alert and oriented to person, place, and time.  Skin: Skin is warm and dry.  Psychiatric: He has a normal mood and affect. His behavior is normal.  Nursing note and vitals reviewed. [facility] reading (PRIMARY) by  Dr.Daub there is [facility]  joint arthritis     Assessment & Plan:

## 2014-06-07 NOTE — Progress Notes (Deleted)
   Subjective:    Patient ID: Preston Reeves, male    DOB: Dec 25, 1940, 74 y.o.   MRN: [id number]  HPI    Review of Systems     Objective:   Physical Exam        Assessment & Plan:

## 2014-06-08 ENCOUNTER — Other Ambulatory Visit: Payer: Self-pay | Admitting: Emergency Medicine

## 2014-06-08 ENCOUNTER — Other Ambulatory Visit: Payer: Commercial Managed Care - HMO

## 2014-06-08 DIAGNOSIS — Z0000 Encounter for general adult medical examination without abnormal findings: Secondary | ICD-10-CM

## 2014-06-08 DIAGNOSIS — R972 Elevated prostate specific antigen [PSA]: Secondary | ICD-10-CM

## 2014-06-08 DIAGNOSIS — E785 Hyperlipidemia, unspecified: Secondary | ICD-10-CM

## 2014-06-08 DIAGNOSIS — R739 Hyperglycemia, unspecified: Secondary | ICD-10-CM

## 2014-06-08 LAB — COMPLETE METABOLIC PANEL WITH GFR
ALBUMIN: 3.9 g/dL (ref 3.5–5.2)
ALK PHOS: 67 U/L (ref 39–117)
ALK PHOS: 72 U/L (ref 39–117)
ALT: 12 U/L (ref 0–53)
ALT: 11 U/L (ref 0–53)
AST: 20 U/L (ref 0–37)
AST: 21 U/L (ref 0–37)
Albumin: 4.1 g/dL (ref 3.5–5.2)
BUN: 14 mg/dL (ref 6–23)
BUN: 15 mg/dL (ref 6–23)
CHLORIDE: 103 meq/L (ref 96–112)
CO2: 29 meq/L (ref 19–32)
CO2: 28 mEq/L (ref 19–32)
Calcium: 9.5 mg/dL (ref 8.4–10.5)
Calcium: 9.5 mg/dL (ref 8.4–10.5)
Chloride: 102 mEq/L (ref 96–112)
Creat: 0.95 mg/dL (ref 0.50–1.35)
Creat: 0.97 mg/dL (ref 0.50–1.35)
GFR, EST AFRICAN AMERICAN: 89 mL/min
GFR, Est Non African American: 77 mL/min
GFR, Est Non African American: 79 mL/min
GLUCOSE: 91 mg/dL (ref 70–99)
GLUCOSE: 95 mg/dL (ref 70–99)
POTASSIUM: 4.5 meq/L (ref 3.5–5.3)
Potassium: 4.3 mEq/L (ref 3.5–5.3)
SODIUM: 138 meq/L (ref 135–145)
SODIUM: 139 meq/L (ref 135–145)
TOTAL PROTEIN: 8.2 g/dL (ref 6.0–8.3)
Total Bilirubin: 0.4 mg/dL (ref 0.2–1.2)
Total Bilirubin: 0.8 mg/dL (ref 0.2–1.2)
Total Protein: 7.8 g/dL (ref 6.0–8.3)

## 2014-06-08 LAB — CBC WITH DIFFERENTIAL/PLATELET
BASOS PCT: 1 % (ref 0–1)
Basophils Absolute: 0.1 10*3/uL (ref 0.0–0.1)
Basophils Absolute: 0 10*3/uL (ref 0.0–0.1)
Basophils Relative: 1 % (ref 0–1)
EOS ABS: 0.2 10*3/uL (ref 0.0–0.7)
EOS PCT: 4 % (ref 0–5)
EOS PCT: 3 % (ref 0–5)
Eosinophils Absolute: 0.2 10*3/uL (ref 0.0–0.7)
HCT: 40 % (ref 39.0–52.0)
HEMATOCRIT: 39.2 % (ref 39.0–52.0)
HEMOGLOBIN: 13.2 g/dL (ref 13.0–17.0)
Hemoglobin: 13 g/dL (ref 13.0–17.0)
LYMPHS PCT: 20 % (ref 12–46)
Lymphocytes Relative: 22 % (ref 12–46)
Lymphs Abs: 1 10*3/uL (ref 0.7–4.0)
Lymphs Abs: 0.9 10*3/uL (ref 0.7–4.0)
MCH: 28.4 pg (ref 26.0–34.0)
MCH: 28.6 pg (ref 26.0–34.0)
MCHC: 33.2 g/dL (ref 30.0–36.0)
MCHC: 33 g/dL (ref 30.0–36.0)
MCV: 85.6 fL (ref 78.0–100.0)
MCV: 86.6 fL (ref 78.0–100.0)
MONOS PCT: 13 % — AB (ref 3–12)
MPV: 9.3 fL (ref 8.6–12.4)
MPV: 9.6 fL (ref 8.6–12.4)
Monocytes Absolute: 0.6 10*3/uL (ref 0.1–1.0)
Monocytes Absolute: 0.6 10*3/uL (ref 0.1–1.0)
Monocytes Relative: 12 % (ref 3–12)
Neutro Abs: 3.3 10*3/uL (ref 1.7–7.7)
Neutro Abs: 2.6 10*3/uL (ref 1.7–7.7)
Neutrophils Relative %: 64 % (ref 43–77)
Neutrophils Relative %: 60 % (ref 43–77)
Platelets: 293 10*3/uL (ref 150–400)
Platelets: 288 10*3/uL (ref 150–400)
RBC: 4.62 MIL/uL (ref 4.22–5.81)
RBC: 4.58 MIL/uL (ref 4.22–5.81)
RDW: 15.8 % — AB (ref 11.5–15.5)
RDW: 16 % — AB (ref 11.5–15.5)
WBC: 5.2 10*3/uL (ref 4.0–10.5)
WBC: 4.3 10*3/uL (ref 4.0–10.5)

## 2014-06-08 LAB — LIPID PANEL
Cholesterol: 160 mg/dL (ref 0–200)
Cholesterol: 166 mg/dL (ref 0–200)
HDL: 39 mg/dL — AB (ref >=40)
HDL: 41 mg/dL (ref >=40)
LDL CALC: 105 mg/dL — AB (ref 0–99)
LDL Cholesterol: 102 mg/dL — ABNORMAL HIGH (ref 0–99)
Total CHOL/HDL Ratio: 4 Ratio
Total CHOL/HDL Ratio: 4.1 Ratio
Triglycerides: 100 mg/dL (ref <150)
Triglycerides: 97 mg/dL (ref <150)
VLDL: 19 mg/dL (ref 0–40)
VLDL: 20 mg/dL (ref 0–40)

## 2014-06-08 LAB — TSH
TSH: 1.577 u[IU]/mL (ref 0.350–4.500)
TSH: 1.754 u[IU]/mL (ref 0.350–4.500)

## 2014-06-08 NOTE — Progress Notes (Signed)
Pt is here for lab work only. 

## 2014-06-09 LAB — PSA, MEDICARE: PSA: 3.84 ng/mL (ref <=4.00)

## 2014-06-11 LAB — PSA, MEDICARE: PSA: 4 ng/mL (ref <=4.00)

## 2014-06-21 ENCOUNTER — Encounter: Payer: Commercial Managed Care - HMO | Admitting: Emergency Medicine

## 2014-07-23 ENCOUNTER — Other Ambulatory Visit: Payer: Commercial Managed Care - HMO

## 2014-07-25 ENCOUNTER — Ambulatory Visit (INDEPENDENT_AMBULATORY_CARE_PROVIDER_SITE_OTHER): Payer: Commercial Managed Care - HMO | Admitting: Cardiology

## 2014-07-25 ENCOUNTER — Encounter: Payer: Self-pay | Admitting: Cardiology

## 2014-07-25 VITALS — BP 142/68 | HR 74 | Ht 70.5 in | Wt 182.0 lb

## 2014-07-25 DIAGNOSIS — I10 Essential (primary) hypertension: Secondary | ICD-10-CM | POA: Diagnosis not present

## 2014-07-25 DIAGNOSIS — I493 Ventricular premature depolarization: Secondary | ICD-10-CM | POA: Insufficient documentation

## 2014-07-25 DIAGNOSIS — R0609 Other forms of dyspnea: Secondary | ICD-10-CM

## 2014-07-25 DIAGNOSIS — I341 Nonrheumatic mitral (valve) prolapse: Secondary | ICD-10-CM | POA: Diagnosis not present

## 2014-07-25 MED ORDER — METOPROLOL SUCCINATE ER 25 MG PO TB24: ORAL_TABLET | ORAL | Status: DC

## 2014-07-25 NOTE — Patient Instructions (Signed)
Medication Instructions:  START TOPROL XL 25 MG 1/2 TABLET DAILY  Labwork: NONE  Testing/Procedures: NONE  Follow-Up: Your physician wants you to follow-up in: 6 months with fasting labs (lp/bmet/hfp) AND EKG You will receive a reminder letter in the mail two months in advance. If you don't receive a letter, please call our office to schedule the follow-up appointment.

## 2014-07-25 NOTE — Progress Notes (Signed)
Cardiology Office Note   Date:  07/25/2014   ID:  Preston Reeves, DOB Sep 09, 1940, MRN [id number]  PCP:  Preston Reichmann, MD  Cardiologist: Preston Coco MD  Chief Complaint  Patient presents with  . Shortness of Breath     History of Present Illness: Preston Reeves is a 74 y.o. male who presents for a one-year follow-up office visit.   He has a past history of thyrotoxicosis. He is followed by Dr. Dwyane Reeves. He has a history of hemochromatosis. He has a history of mitral valve prolapse. Since last visit he has had a lot of gastrointestinal problems. In 2005 he had surgery for a ruptured diverticular abscess. At that time he had a temporary colostomy which was subsequently closed. Over the past 10 years he had developed multiple abdominal hernias. He went to surgery on 10/13/13 by Dr. Johney Maine. Laparoscopic surgery was not possible and he had an open procedure.  The patient has a history of mitral valve prolapse.  He has some mild exertional dyspnea.  He has a past history of COPD.  His blood pressure has been running a little high.  He has been having some PVCs noted on today's EKG.  Past Medical History  Diagnosis Date  . MVP (mitral valve prolapse)   . Diverticulosis     hx of  . History of BPH   . Hemochromatosis     hx of  . History of COPD   . Thyroid disease   . Incisional hernia   . Hyperlipidemia   . Diverticulitis of colon with perforation 2010    Colectomy/ostomy  . Colon polyps     Adenomatous  . Allergy     SEASONAL  . Cataract   . Hypothyroidism     Past Surgical History  Procedure Laterality Date  . Inguinal hernia repair Left 1999    Dr. Rebekah Reeves  . Partial colectomy  08/12/2008    Preston Reeves  . Colostomy takedown  2010    Dr Preston Reeves  . Colonoscopy w/ biopsies  2011    Dr Preston Reeves  . Colonoscopy    . Upper gi endoscopy    . Ventral hernia repair N/A 10/13/2013    Procedure: REPAIR OF VENTRAL WALL HERNIA WITH UNILATERAL COMPARTMENT SEPARATION;   Surgeon: Preston Boston, MD;  Location: [place];  Service: General;  Laterality: N/A;  . Insertion of mesh N/A 10/13/2013    Procedure: INSERTION OF MESH;  Surgeon: Preston Boston, MD;  Location: [place];  Service: General;  Laterality: N/A;  . Laparoscopic lysis of adhesions N/A 10/13/2013    Procedure: LAPAROSCOPIC LYSIS OF ADHESIONS;  Surgeon: Preston Boston, MD;  Location: [place];  Service: General;  Laterality: N/A;     Current Outpatient Prescriptions  Medication Sig Dispense Refill  . acetaminophen (TYLENOL) 500 MG tablet Take 100 mg by mouth every 8 (eight) hours as needed for fever.    . Ascorbic Acid (VITAMIN C) 1000 MG tablet Take 1,000 mg by mouth daily.      Marland Kitchen aspirin 81 MG tablet Take 81 mg by mouth 2 (two) times daily.     . Biotin 10 MG CAPS Take 10 mg by mouth daily.     . Calcium Carbonate-Vitamin D (CALCIUM + D PO) Take 1 tablet by mouth daily.     . Cholecalciferol (VITAMIN D) 2000 UNITS CAPS Take 2,000 Units by mouth daily.     . fexofenadine-pseudoephedrine (ALLEGRA-D 24) 180-240 MG per 24 hr tablet Take 1  tablet by mouth daily.    . Glucosamine 500 MG CAPS Take 500 mg by mouth daily.     Marland Kitchen loratadine (CLARITIN) 10 MG tablet Take 10 mg by mouth daily as needed for allergies. As needed    . Methylcellulose, Laxative, (CITRUCEL PO) Take 2 tablets by mouth 2 (two) times daily.     . Multiple Vitamins-Minerals (MULTIVITAMIN PO) Take 1 tablet by mouth daily.     . Omega-3 Fatty Acids (FISH OIL PO) Take 900 mg by mouth daily. Taking 900 daily    . Red Yeast Rice 600 MG CAPS Take 1,200 mg by mouth daily.     Marland Kitchen SALINE NASAL SPRAY NA Place 1 spray into the nose daily as needed (allergies).     . SYNTHROID 150 MCG tablet Take 1 tablet (150 mcg total) by mouth daily before breakfast. 90 tablet 3  . vitamin B-12 (CYANOCOBALAMIN) 1000 MCG tablet Take 1,000 mcg by mouth daily.    . metoprolol succinate (TOPROL XL) 25 MG 24 hr tablet 1/2 TABLET DAILY 45 tablet 3   No current  facility-administered medications for this visit.    Allergies:   Review of patient's allergies indicates not on file.    Social History:  The patient  reports that he has quit smoking. His smoking use included Cigarettes. He has never used smokeless tobacco. He reports that he drinks alcohol. He reports that he does not use illicit drugs.   Family History:  The patient's family history includes Breast cancer in his mother; Colon cancer in his father; Diabetes in his father; Heart attack in his father; Heart disease in his father. There is no history of Stroke.    ROS:  Please see the history of present illness.   Otherwise, review of systems are positive for none.   All other systems are reviewed and negative.    PHYSICAL EXAM: VS:  BP 142/68 mmHg  Pulse 74  Ht 5' 10.5" (1.791 m)  Wt 182 lb (82.555 kg)  BMI 25.74 kg/m2 , BMI Body mass index is 25.74 kg/(m^2). GEN: Well nourished, well developed, in no acute distress HEENT: normal Neck: no JVD, carotid bruits, or masses Cardiac: RRR; no murmurs, rubs, or gallops,no edema .  There is a soft apical midsystolic click Respiratory:  clear to auscultation bilaterally, normal work of breathing GI: soft, nontender, nondistended, + BS MS: no deformity or atrophy Skin: warm and dry, no rash Neuro:  Strength and sensation are intact Psych: euthymic mood, full affect   EKG:  EKG is ordered today. The ekg ordered today demonstrates normal sinus rhythm with frequent PVCs.  Incomplete left bundle-branch block.   Recent Labs: 06/07/2014: ALT 11; ALT 12; BUN 15; BUN 14; Creat 0.95; Creat 0.97; Hemoglobin 13.2; Hemoglobin 13.0; Platelets 293; Platelets 288; Potassium 4.3; Potassium 4.5; Sodium 139; Sodium 138; TSH 1.754; TSH 1.577    Lipid Panel    Component Value Date/Time   CHOL 166 06/07/2014 0933   CHOL 160 06/07/2014 0933   TRIG 100 06/07/2014 0933   TRIG 97 06/07/2014 0933   HDL 41 06/07/2014 0933   HDL 39* 06/07/2014 0933    CHOLHDL 4.0 06/07/2014 0933   CHOLHDL 4.1 06/07/2014 0933   VLDL 20 06/07/2014 0933   VLDL 19 06/07/2014 0933   LDLCALC 105* 06/07/2014 0933   LDLCALC 102* 06/07/2014 0933      Wt Readings from Last 3 Encounters:  07/25/14 182 lb (82.555 kg)  06/07/14 178 lb 9.6 oz (81.012 kg)  05/04/14 184 lb (83.462 kg)        ASSESSMENT AND PLAN:  1. Mitral valve prolapse 2. Hypercholesterolemia 3. incisional abdominal hernia, recovering from surgery done on 10/13/13 by Dr. Johney Maine 4. history of hemachromatosis 5. history of post-ablative hypothyroidism, on Synthroid, followed by Dr. Dwyane Reeves 6.  Frequent PVCs 7.  Essential hypertension  Plan: Start Toprol-XL 12.5 mg daily. Recheck in 6 months for office visit EKG lipid panel hepatic function panel and basal metabolic panel   Current medicines are reviewed at length with the patient today.  The patient does not have concerns regarding medicines.  The following changes have been made:  no change  Labs/ tests ordered today include:   Orders Placed This Encounter  Procedures  . Lipid panel  . Hepatic function panel  . Basic metabolic panel  . EKG 12-Lead      Signed, Preston Coco MD 07/25/2014 5:41 PM    [facility] [address], [place], [place]  [address] Phone: [phone]; Fax: [phone]

## 2014-10-30 ENCOUNTER — Encounter: Payer: Self-pay | Admitting: Emergency Medicine

## 2014-12-17 ENCOUNTER — Other Ambulatory Visit (INDEPENDENT_AMBULATORY_CARE_PROVIDER_SITE_OTHER): Payer: Commercial Managed Care - HMO

## 2014-12-17 DIAGNOSIS — E890 Postprocedural hypothyroidism: Secondary | ICD-10-CM | POA: Diagnosis not present

## 2014-12-17 LAB — TSH: TSH: 2.33 u[IU]/mL (ref 0.35–4.50)

## 2014-12-17 LAB — T4, FREE: FREE T4: 1.34 ng/dL (ref 0.60–1.60)

## 2014-12-21 ENCOUNTER — Ambulatory Visit: Payer: Commercial Managed Care - HMO | Admitting: Endocrinology

## 2014-12-24 ENCOUNTER — Ambulatory Visit (INDEPENDENT_AMBULATORY_CARE_PROVIDER_SITE_OTHER): Payer: Commercial Managed Care - HMO | Admitting: Endocrinology

## 2014-12-24 ENCOUNTER — Encounter: Payer: Self-pay | Admitting: Endocrinology

## 2014-12-24 VITALS — BP 138/84 | HR 82 | Temp 97.9°F | Resp 14 | Ht 70.5 in | Wt 183.4 lb

## 2014-12-24 DIAGNOSIS — E890 Postprocedural hypothyroidism: Secondary | ICD-10-CM | POA: Diagnosis not present

## 2014-12-24 NOTE — Progress Notes (Signed)
Reason for Appointment:  Hypothyroidism, followup visit    History of Present Illness:   The hypothyroidism was first diagnosed in 2007 after radioactive iodine treatment for Graves' disease   The patient has been treated with  Levoxyl, generic levothyroxine and brand name Synthroid  His last doses change was in 11/15 and at that time his TSH was slightly increased at 4.7 and his levothyroxine dose was increased to 150 g He thinks he may have felt a little less fatigue with this change   Today he has no complaints of unusual fatigue currently and overall feels good Weight is stable      The patient is taking the thyroid supplement very regularly in the morning before breakfast. He is taking calcium at night  TSH is now consistently back to normal  Lab Results  Component Value Date   FREET4 1.34 12/17/2014   FREET4 1.22 04/30/2014   FREET4 1.16 12/25/2013   TSH 2.33 12/17/2014   TSH 1.577 06/07/2014   TSH 1.754 06/07/2014   Wt Readings from Last 3 Encounters:  12/24/14 183 lb 6.4 oz (83.19 kg)  07/25/14 182 lb (82.555 kg)  06/07/14 178 lb 9.6 oz (81.012 kg)      Medication List       This list is accurate as of: 12/24/14  9:50 AM.  Always use your most recent med list.               acetaminophen 500 MG tablet  Commonly known as:  TYLENOL  Take 100 mg by mouth every 8 (eight) hours as needed for fever.     aspirin 81 MG tablet  Take 81 mg by mouth 2 (two) times daily.     Biotin 10 MG Caps  Take 10 mg by mouth daily.     CALCIUM + D PO  Take 1 tablet by mouth daily.     CITRUCEL PO  Take 2 tablets by mouth 2 (two) times daily.     fexofenadine-pseudoephedrine 180-240 MG 24 hr tablet  Commonly known as:  ALLEGRA-D 24  Take 1 tablet by mouth daily.     FISH OIL PO  Take 900 mg by mouth daily. Taking 900 daily     FLUZONE HIGH-DOSE 0.5 ML Susy  Generic drug:  Influenza Vac Split High-Dose  ADM 0.5ML IM UTD     Glucosamine 500 MG Caps    Take 500 mg by mouth daily.     loratadine 10 MG tablet  Commonly known as:  CLARITIN  Take 10 mg by mouth daily as needed for allergies. As needed     metoprolol succinate 25 MG 24 hr tablet  Commonly known as:  TOPROL XL  1/2 TABLET DAILY     MULTIVITAMIN PO  Take 1 tablet by mouth daily.     Red Yeast Rice 600 MG Caps  Take 1,200 mg by mouth daily.     SALINE NASAL SPRAY NA  Place 1 spray into the nose daily as needed (allergies).     SYNTHROID 150 MCG tablet  Generic drug:  levothyroxine  Take 1 tablet (150 mcg total) by mouth daily before breakfast.     vitamin B-12 1000 MCG tablet  Commonly known as:  CYANOCOBALAMIN  Take 1,000 mcg by mouth daily.     vitamin C 1000 MG tablet  Take 1,000 mg by mouth daily.     Vitamin D 2000 UNITS Caps  Take 2,000 Units by mouth daily.  Allergies: Not on File  Past Medical History  Diagnosis Date  . MVP (mitral valve prolapse)   . Diverticulosis     hx of  . History of BPH   . Hemochromatosis     hx of  . History of COPD   . Thyroid disease   . Incisional hernia   . Hyperlipidemia   . Diverticulitis of colon with perforation 2010    Colectomy/ostomy  . Colon polyps     Adenomatous  . Allergy     SEASONAL  . Cataract   . Hypothyroidism     Past Surgical History  Procedure Laterality Date  . Inguinal hernia repair Left 1999    Dr. Rebekah Chesterfield  . Partial colectomy  08/12/2008    Jeanette Caprice  . Colostomy takedown  2010    Dr Barry Dienes  . Colonoscopy w/ biopsies  2011    Dr Henrene Pastor  . Colonoscopy    . Upper gi endoscopy    . Ventral hernia repair N/A 10/13/2013    Procedure: REPAIR OF VENTRAL WALL HERNIA WITH UNILATERAL COMPARTMENT SEPARATION;  Surgeon: Michael Boston, MD;  Location: [facility];  Service: General;  Laterality: N/A;  . Insertion of mesh N/A 10/13/2013    Procedure: INSERTION OF MESH;  Surgeon: Michael Boston, MD;  Location: [facility];  Service: General;  Laterality: N/A;  . Laparoscopic lysis of adhesions N/A  10/13/2013    Procedure: LAPAROSCOPIC LYSIS OF ADHESIONS;  Surgeon: Michael Boston, MD;  [facility] OR;  Service: General;  Laterality: N/A;    Family History  Problem Relation Age of Onset  . Breast cancer Mother   . Colon cancer Father   . Heart disease Father   . Diabetes Father   . Heart attack Father   . Stroke Neg Hx     Social History:  reports that he has quit smoking. His smoking use included Cigarettes. He has never used smokeless tobacco. He reports that he drinks alcohol. He reports that he does not use illicit drugs.  REVIEW Of SYSTEMS:  Review of Systems  Constitutional: Negative for weight loss.      Examination:   BP 138/84 mmHg  Pulse 82  Temp(Src) 97.9 F (36.6 C)  Resp 14  Ht 5' 10.5" (1.791 m)  Wt 183 lb 6.4 oz (83.19 kg)  BMI 25.93 kg/m2  SpO2 95%  Looks well Biceps reflexes appear normal No swelling of the hands are dry skin    Assessment   Hypothyroidism, post ablative treated with brand name Synthroid 150 g Clinically looks euthyroid and his TSH is now consistently normal after the change in dose in 11/15    Treatment:  The dose will be continued unchanged He will call if he has any unusual fatigue and continue to take the medication before breakfast Follow up annually  [facility] 12/24/2014, 9:50 AM

## 2014-12-31 ENCOUNTER — Ambulatory Visit (HOSPITAL_BASED_OUTPATIENT_CLINIC_OR_DEPARTMENT_OTHER): Payer: Commercial Managed Care - HMO | Admitting: Hematology & Oncology

## 2014-12-31 ENCOUNTER — Other Ambulatory Visit (HOSPITAL_BASED_OUTPATIENT_CLINIC_OR_DEPARTMENT_OTHER): Payer: Commercial Managed Care - HMO

## 2014-12-31 ENCOUNTER — Encounter: Payer: Self-pay | Admitting: *Deleted

## 2014-12-31 ENCOUNTER — Encounter: Payer: Self-pay | Admitting: Hematology & Oncology

## 2014-12-31 LAB — CBC WITH DIFFERENTIAL (CANCER CENTER ONLY)
BASO#: 0 10*3/uL (ref 0.0–0.2)
BASO%: 0.6 % (ref 0.0–2.0)
EOS%: 6.1 % (ref 0.0–7.0)
Eosinophils Absolute: 0.3 10*3/uL (ref 0.0–0.5)
HCT: 42.7 % (ref 38.7–49.9)
HEMOGLOBIN: 14.1 g/dL (ref 13.0–17.1)
LYMPH#: 1.2 10*3/uL (ref 0.9–3.3)
LYMPH%: 23.5 % (ref 14.0–48.0)
MCH: 29.3 pg (ref 28.0–33.4)
MCHC: 33 g/dL (ref 32.0–35.9)
MCV: 89 fL (ref 82–98)
MONO#: 0.7 10*3/uL (ref 0.1–0.9)
MONO%: 12.9 % (ref 0.0–13.0)
NEUT%: 56.9 % (ref 40.0–80.0)
NEUTROS ABS: 3 10*3/uL (ref 1.5–6.5)
PLATELETS: 252 10*3/uL (ref 145–400)
RBC: 4.81 10*6/uL (ref 4.20–5.70)
RDW: 14.5 % (ref 11.1–15.7)
WBC: 5.3 10*3/uL (ref 4.0–10.0)

## 2014-12-31 LAB — FERRITIN: FERRITIN: 19 ng/mL — AB (ref 22–316)

## 2014-12-31 LAB — IRON AND TIBC
%SAT: 34 % (ref 20–55)
Iron: 80 ug/dL (ref 42–163)
TIBC: 238 ug/dL (ref 202–409)
UIBC: 158 ug/dL (ref 117–376)

## 2014-12-31 NOTE — Progress Notes (Signed)
Hematology and Oncology Follow Up Visit  Preston Reeves [id number] November 28, 1940 74 y.o. 12/31/2014   Principle Diagnosis:   Hemochromatosis  Current Therapy:    Phlebotomy to maintain ferritin less than 100     Interim History:  Preston Reeves is back for follow-up. We see him yearly. He's doing quite well. He is not been phlebotomized for many years. He occasionally goes to the [facility] for blood donation.  We will last saw him a year ago, his ferritin was 47 with an iron saturation of 40%.  He's had no problems with nausea or vomiting. He's had no joint issues. He's had no change in bowel or bladder habits. He's had a cough. Is under shortness of breath. There's been no rashes. He's had no leg swelling.  Medications:  Current outpatient prescriptions:  .  acetaminophen (TYLENOL) 500 MG tablet, Take 100 mg by mouth every 8 (eight) hours as needed for fever., Disp: , Rfl:  .  Ascorbic Acid (VITAMIN C) 1000 MG tablet, Take 1,000 mg by mouth daily.  , Disp: , Rfl:  .  aspirin 81 MG tablet, Take 81 mg by mouth 2 (two) times daily. , Disp: , Rfl:  .  Biotin 10 MG CAPS, Take 10 mg by mouth daily. , Disp: , Rfl:  .  Calcium Carbonate-Vitamin D (CALCIUM + D PO), Take 1 tablet by mouth daily. , Disp: , Rfl:  .  Cholecalciferol (VITAMIN D) 2000 UNITS CAPS, Take 2,000 Units by mouth daily. , Disp: , Rfl:  .  fexofenadine-pseudoephedrine (ALLEGRA-D 24) 180-240 MG per 24 hr tablet, Take 1 tablet by mouth daily., Disp: , Rfl:  .  FLUZONE HIGH-DOSE 0.5 ML SUSY, ADM 0.5ML IM UTD, Disp: , Rfl: 0 .  Glucosamine 500 MG CAPS, Take 500 mg by mouth daily. , Disp: , Rfl:  .  loratadine (CLARITIN) 10 MG tablet, Take 10 mg by mouth daily as needed for allergies. As needed, Disp: , Rfl:  .  Methylcellulose, Laxative, (CITRUCEL PO), Take 2 tablets by mouth 2 (two) times daily. , Disp: , Rfl:  .  metoprolol succinate (TOPROL XL) 25 MG 24 hr tablet, 1/2 TABLET DAILY, Disp: 45 tablet, Rfl: 3 .  Multiple  Vitamins-Minerals (MULTIVITAMIN PO), Take 1 tablet by mouth daily. , Disp: , Rfl:  .  Omega-3 Fatty Acids (FISH OIL PO), Take 900 mg by mouth daily. Taking 900 daily, Disp: , Rfl:  .  Red Yeast Rice 600 MG CAPS, Take 1,200 mg by mouth daily. , Disp: , Rfl:  .  SALINE NASAL SPRAY NA, Place 1 spray into the nose daily as needed (allergies). , Disp: , Rfl:  .  SYNTHROID 150 MCG tablet, Take 1 tablet (150 mcg total) by mouth daily before breakfast., Disp: 90 tablet, Rfl: 3 .  vitamin B-12 (CYANOCOBALAMIN) 1000 MCG tablet, Take 1,000 mcg by mouth daily., Disp: , Rfl:   Allergies: Not on File  Past Medical History, Surgical history, Social history, and Family History were reviewed and updated.  Review of Systems: As above  Physical Exam:  height is 5\' 10"  (1.778 m) and weight is 181 lb (82.101 kg). His oral temperature is 97.4 F (36.3 C). His blood pressure is 175/84 and his pulse is 85. His respiration is 18.   Wt Readings from Last 3 Encounters:  12/31/14 181 lb (82.101 kg)  12/24/14 183 lb 6.4 oz (83.19 kg)  07/25/14 182 lb (82.555 kg)     Well-developed and well-nourished white gentleman in no  obvious distress. Head and neck exam shows no ocular or oral lesions. There are no palpable cervical or supraclavicular lymph nodes. Lungs are clear. Cardiac exam regular rate and rhythm with no murmurs, rubs or bruits. Abdomen is soft. Has good bowel sound. There is no fluid wave. There is no palpable liver or spleen tip. Back exam shows no tenderness over the spine, ribs or hips. Extremities shows no clubbing, cyanosis or edema. Skin exam shows no rashes, ecchymoses or petechia.  Lab Results  Component Value Date   WBC 5.3 12/31/2014   HGB 14.1 12/31/2014   HCT 42.7 12/31/2014   MCV 89 12/31/2014   PLT 252 12/31/2014     Chemistry      Component Value Date/Time   NA 138 06/07/2014 0933   NA 139 06/07/2014 0933   K 4.5 06/07/2014 0933   K 4.3 06/07/2014 0933   CL 102 06/07/2014 0933    CL 103 06/07/2014 0933   CO2 29 06/07/2014 0933   CO2 28 06/07/2014 0933   BUN 14 06/07/2014 0933   BUN 15 06/07/2014 0933   CREATININE 0.97 06/07/2014 0933   CREATININE 0.95 06/07/2014 0933   CREATININE 1.06 12/04/2013 0504      Component Value Date/Time   CALCIUM 9.5 06/07/2014 0933   CALCIUM 9.5 06/07/2014 0933   ALKPHOS 67 06/07/2014 0933   ALKPHOS 72 06/07/2014 0933   AST 21 06/07/2014 0933   AST 20 06/07/2014 0933   ALT 12 06/07/2014 0933   ALT 11 06/07/2014 0933   BILITOT 0.8 06/07/2014 0933   BILITOT 0.4 06/07/2014 0933         Impression and Plan: Preston Reeves is a 74 year old gentleman with hemochromatosis. I will see what his iron studies are. Again, I would think that he would not need to be phlebotomized.  I think going to the [employer] will definitely be a player for him. I think his blood is certainly well enough that he can donate.  We'll plan to get him back in 1 more year.   [name], MD 12/5/201610:16 AM

## 2015-01-09 ENCOUNTER — Other Ambulatory Visit (INDEPENDENT_AMBULATORY_CARE_PROVIDER_SITE_OTHER): Payer: Commercial Managed Care - HMO | Admitting: *Deleted

## 2015-01-09 DIAGNOSIS — I341 Nonrheumatic mitral (valve) prolapse: Secondary | ICD-10-CM

## 2015-01-09 DIAGNOSIS — I493 Ventricular premature depolarization: Secondary | ICD-10-CM

## 2015-01-09 DIAGNOSIS — I10 Essential (primary) hypertension: Secondary | ICD-10-CM

## 2015-01-09 LAB — BASIC METABOLIC PANEL
BUN: 16 mg/dL (ref 7–25)
CALCIUM: 9.4 mg/dL (ref 8.6–10.3)
CO2: 28 mmol/L (ref 20–31)
Chloride: 104 mmol/L (ref 98–110)
Creat: 0.97 mg/dL (ref 0.70–1.18)
Glucose, Bld: 99 mg/dL (ref 65–99)
Potassium: 4.1 mmol/L (ref 3.5–5.3)
SODIUM: 140 mmol/L (ref 135–146)

## 2015-01-09 LAB — HEPATIC FUNCTION PANEL
ALBUMIN: 3.9 g/dL (ref 3.6–5.1)
ALT: 13 U/L (ref 9–46)
AST: 22 U/L (ref 10–35)
Alkaline Phosphatase: 58 U/L (ref 40–115)
BILIRUBIN TOTAL: 0.6 mg/dL (ref 0.2–1.2)
Bilirubin, Direct: 0.1 mg/dL (ref <=0.2)
Indirect Bilirubin: 0.5 mg/dL (ref 0.2–1.2)
TOTAL PROTEIN: 7.7 g/dL (ref 6.1–8.1)

## 2015-01-09 LAB — LIPID PANEL
CHOL/HDL RATIO: 4 ratio (ref <=5.0)
CHOLESTEROL: 147 mg/dL (ref 125–200)
HDL: 37 mg/dL — AB (ref >=40)
LDL Cholesterol: 93 mg/dL (ref <130)
Triglycerides: 84 mg/dL (ref <150)
VLDL: 17 mg/dL (ref <30)

## 2015-01-09 NOTE — Addendum Note (Signed)
Addended by: Eulis Foster on: 01/09/2015 07:53 AM   Modules accepted: Orders

## 2015-01-10 NOTE — Progress Notes (Signed)
Quick Note:  Please make copy of labs for patient visit. ______ 

## 2015-01-11 ENCOUNTER — Ambulatory Visit (INDEPENDENT_AMBULATORY_CARE_PROVIDER_SITE_OTHER): Payer: Commercial Managed Care - HMO | Admitting: Cardiology

## 2015-01-11 ENCOUNTER — Encounter: Payer: Self-pay | Admitting: Cardiology

## 2015-01-11 VITALS — BP 160/86 | HR 78 | Ht 70.5 in | Wt 181.4 lb

## 2015-01-11 DIAGNOSIS — R0609 Other forms of dyspnea: Secondary | ICD-10-CM

## 2015-01-11 DIAGNOSIS — I10 Essential (primary) hypertension: Secondary | ICD-10-CM | POA: Diagnosis not present

## 2015-01-11 DIAGNOSIS — I341 Nonrheumatic mitral (valve) prolapse: Secondary | ICD-10-CM

## 2015-01-11 DIAGNOSIS — I493 Ventricular premature depolarization: Secondary | ICD-10-CM | POA: Diagnosis not present

## 2015-01-11 MED ORDER — METOPROLOL SUCCINATE ER 25 MG PO TB24: 25.0000 mg | ORAL_TABLET | Freq: Every day | ORAL | Status: DC

## 2015-01-11 NOTE — Progress Notes (Signed)
Cardiology Office Note   Date:  01/11/2015   ID:  [name], DOB Sep 18, 1940, MRN [id number]  PCP:  Preston Reichmann, Reeves  Cardiologist: Preston Reeves  No chief complaint on file.     History of Present Illness: Preston Reeves is a 74 y.o. male who presents for a six-month follow-up visit.   He has a past history of thyrotoxicosis. He is followed by Dr. Dwyane Reeves. He has a history of hemochromatosis. He has a history of mitral valve prolapse. Since last visit he has had a lot of gastrointestinal problems. In 2005 he had surgery for a ruptured diverticular abscess. At that time he had a temporary colostomy which was subsequently closed. Over the past 10 years he had developed multiple abdominal hernias. He went to surgery on 10/13/13 by Dr. Johney Maine. Laparoscopic surgery was not possible and he had an open procedure.  The patient has a history of mitral valve prolapse. He has some mild exertional dyspnea. He has a past history of COPD. he no longer smokes cigarettes. His blood pressure has been running a little high. He has been having some PVCs noted on today's EKG.   At his last visit we had started him on low dose Toprol 12.5 mg daily. He has not been experiencing any chest pain or increased shortness of breath.  He does have chronic stable exertional dyspnea related to his COPD.  Past Medical History  Diagnosis Date  . MVP (mitral valve prolapse)   . Diverticulosis     hx of  . History of BPH   . Hemochromatosis     hx of  . History of COPD   . Thyroid disease   . Incisional hernia   . Hyperlipidemia   . Diverticulitis of colon with perforation 2010    Colectomy/ostomy  . Colon polyps     Adenomatous  . Allergy     SEASONAL  . Cataract   . Hypothyroidism     Past Surgical History  Procedure Laterality Date  . Inguinal hernia repair Left 1999    Dr. Rebekah Reeves  . Partial colectomy  08/12/2008    Preston Reeves  . Colostomy takedown  2010    Dr Barry Dienes  .  Colonoscopy w/ biopsies  2011    Dr Preston Reeves  . Colonoscopy    . Upper gi endoscopy    . Ventral hernia repair N/A 10/13/2013    Procedure: REPAIR OF VENTRAL WALL HERNIA WITH UNILATERAL COMPARTMENT SEPARATION;  Surgeon: Preston Boston, Reeves;  Location: [facility];  Service: General;  Laterality: N/A;  . Insertion of mesh N/A 10/13/2013    Procedure: INSERTION OF MESH;  Surgeon: Preston Boston, Reeves;  Location: [facility];  Service: General;  Laterality: N/A;  . Laparoscopic lysis of adhesions N/A 10/13/2013    Procedure: LAPAROSCOPIC LYSIS OF ADHESIONS;  Surgeon: Preston Boston, Reeves;  Location: [facility];  Service: General;  Laterality: N/A;     Current Outpatient Prescriptions  Medication Sig Dispense Refill  . acetaminophen (TYLENOL) 500 MG tablet Take 100 mg by mouth every 8 (eight) hours as needed for fever.    . Ascorbic Acid (VITAMIN C) 1000 MG tablet Take 1,000 mg by mouth daily.      Marland Kitchen aspirin 81 MG tablet Take 81 mg by mouth 2 (two) times daily.     . Biotin 10 MG CAPS Take 10 mg by mouth daily.     . Calcium Carbonate-Vitamin D (CALCIUM + D PO) Take 1 tablet  by mouth daily.     . Cholecalciferol (VITAMIN D) 2000 UNITS CAPS Take 2,000 Units by mouth daily.     . fexofenadine-pseudoephedrine (ALLEGRA-D 24) 180-240 MG per 24 hr tablet Take 1 tablet by mouth daily.    Marland Kitchen FLUZONE HIGH-DOSE 0.5 ML SUSY ADM 0.5ML IM UTD  0  . Glucosamine 500 MG CAPS Take 500 mg by mouth daily.     Marland Kitchen loratadine (CLARITIN) 10 MG tablet Take 10 mg by mouth daily as needed for allergies. As needed    . Methylcellulose, Laxative, (CITRUCEL PO) Take 2 tablets by mouth 2 (two) times daily.     . metoprolol succinate (TOPROL-XL) 25 MG 24 hr tablet Take 1 tablet (25 mg total) by mouth daily. 90 tablet 3  . Multiple Vitamins-Minerals (MULTIVITAMIN PO) Take 1 tablet by mouth daily.     . Omega-3 Fatty Acids (FISH OIL PO) Take 900 mg by mouth daily. Taking 900 daily    . Red Yeast Rice 600 MG CAPS Take 1,200 mg by mouth daily.     Marland Kitchen SALINE  NASAL SPRAY NA Place 1 spray into the nose daily as needed (allergies).     . SYNTHROID 150 MCG tablet Take 1 tablet (150 mcg total) by mouth daily before breakfast. 90 tablet 3  . vitamin B-12 (CYANOCOBALAMIN) 1000 MCG tablet Take 1,000 mcg by mouth daily.     No current facility-administered medications for this visit.    Allergies:   Review of patient's allergies indicates not on file.    Social History:  The patient  reports that he has quit smoking. His smoking use included Cigarettes. He has never used smokeless tobacco. He reports that he drinks alcohol. He reports that he does not use illicit drugs.   Family History:  The patient's family history includes Breast cancer in his mother; Colon cancer in his father; Diabetes in his father; Heart attack in his father; Heart disease in his father. There is no history of Stroke.    ROS:  Please see the history of present illness.   Otherwise, review of systems are positive for none.   All other systems are reviewed and negative.    PHYSICAL EXAM: VS:  BP 160/86 mmHg  Pulse 78  Ht 5' 10.5" (1.791 m)  Wt 181 lb 6.4 oz (82.283 kg)  BMI 25.65 kg/m2 , BMI Body mass index is 25.65 kg/(m^2). GEN: Well nourished, well developed, in no acute distress HEENT: normal Neck: no JVD, carotid bruits, or masses Cardiac: RRR; there is a soft apical systolic murmur. no, rubs, or gallops,no edema  Respiratory:  clear to auscultation bilaterally, normal work of breathing GI: soft, nontender, nondistended, + BS MS: no deformity or atrophy Skin: warm and dry, no rash Neuro:  Strength and sensation are intact Psych: euthymic mood, full affect   EKG:  EKG is ordered today. The ekg ordered today demonstrates normal sinus rhythm at 81 bpm.  Occasional PVCs.  Incomplete right bundle branch block.  Nonspecific ST-T wave changes.   Recent Labs: 12/17/2014: TSH 2.33 12/31/2014: HGB 14.1; Platelets 252 01/09/2015: ALT 13; BUN 16; Creat 0.97; Potassium 4.1;  Sodium 140    Lipid Panel    Component Value Date/Time   CHOL 147 01/09/2015 0754   TRIG 84 01/09/2015 0754   HDL 37* 01/09/2015 0754   CHOLHDL 4.0 01/09/2015 0754   VLDL 17 01/09/2015 0754   LDLCALC 93 01/09/2015 0754      Wt Readings from Last 3 Encounters:  01/11/15 181 lb 6.4 oz (82.283 kg)  12/31/14 181 lb (82.101 kg)  12/24/14 183 lb 6.4 oz (83.19 kg)         ASSESSMENT AND PLAN:  1. Mitral valve prolapse 2. Hypercholesterolemia 3. incisional abdominal hernia, recovering from surgery done on 10/13/13 by Dr. Johney Maine 4. history of hemachromatosis followed by Dr.Ennever.  He has not had to have any recent phlebotomies. 5. history of post-ablative hypothyroidism, on Synthroid, followed by Dr. Dwyane Reeves 6. Frequent PVCs 7. Essential hypertension not adequately controlled  Plan: Increase Toprol XL up to 25 mg daily Recheck in 6 months for office visit EKG lipid panel hepatic function panel and basal metabolic panel   Current medicines are reviewed at length with the patient today.  The patient does not have concerns regarding medicines.  The following changes have been made:  His blood pressure is still not adequately controlled.  We will increase his Toprol to a full 25 mg tablet daily  Labs/ tests ordered today include:   Orders Placed This Encounter  Procedures  . EKG 12-Lead    Disposition: Recheck in 6 months for office visit lipid panel hepatic function panel and basal metabolic panel with Dr. Meda Coffee  Signed, Preston Reeves 01/11/2015 12:45 PM    [address], [place], [place]  [address] Phone: [phone]; Fax: [phone]

## 2015-01-11 NOTE — Patient Instructions (Signed)
Medication Instructions:  INCREASE YOUR TOPROL (METOPROLOL) TO 25 MG DAILY   Labwork: NONE  Testing/Procedures: NONE  Follow-Up: Your physician wants you to follow-up in: 6 months with fasting labs (lp/bmet/hfp) with Dr Johann Capers will receive a reminder letter in the mail two months in advance. If you don't receive a letter, please call our office to schedule the follow-up appointment.  If you need a refill on your cardiac medications before your next appointment, please call your pharmacy.

## 2015-04-22 ENCOUNTER — Other Ambulatory Visit: Payer: Self-pay | Admitting: Endocrinology

## 2015-04-23 ENCOUNTER — Telehealth: Payer: Self-pay | Admitting: Endocrinology

## 2015-04-23 NOTE — Telephone Encounter (Signed)
What is the reasoning for changing pt to the generic levothyroxine

## 2015-04-24 ENCOUNTER — Other Ambulatory Visit: Payer: Self-pay | Admitting: *Deleted

## 2015-04-24 NOTE — Telephone Encounter (Signed)
Please see below,  °

## 2015-04-24 NOTE — Telephone Encounter (Signed)
I spoke with Preston Reeves, he realized that it was the pharmacy's fault for giving him generic, they were out of the name brand.  He has already straightened it out with them.

## 2015-04-24 NOTE — Telephone Encounter (Signed)
Pharmacist has done this without permission

## 2015-05-02 ENCOUNTER — Telehealth: Payer: Self-pay | Admitting: Cardiology

## 2015-05-02 NOTE — Telephone Encounter (Signed)
error 

## 2015-06-02 IMAGING — CT CT ABD-PELV W/ CM
2 of 5 series · 16 of 46 positions shown, 18 images · IV contrast (APPLIED)
Comparison: 08/22/2008

CLINICAL DATA: Generalized abdominal pain and leukocytosis.
Symptoms have been present since hernia repair September 2013.

EXAM:
CT ABDOMEN AND PELVIS WITH CONTRAST
TECHNIQUE: Multidetector CT imaging of the abdomen and pelvis was performed
using the standard protocol following bolus administration of
intravenous contrast.
CONTRAST:  100mL OMNIPAQUE IOHEXOL 300 MG/ML  SOLN

[Series 3: abd/ pelvis 5.0 i30f 1 · axial · 0.75mm/px · z∈[+891,+1296]mm · 13 of 93 slices shown, 15 images]
[im 6/93  soft-tissue]
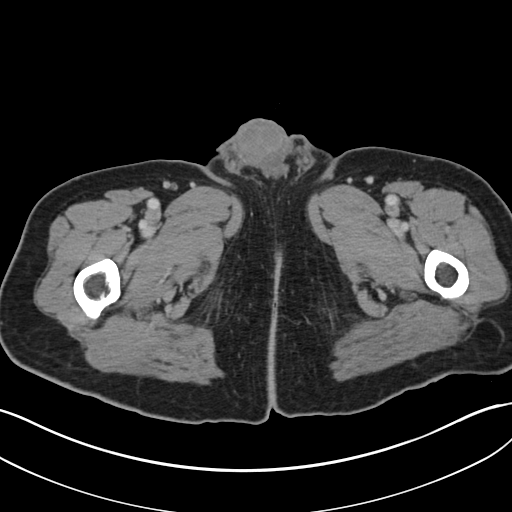
[im 6/93  bone]
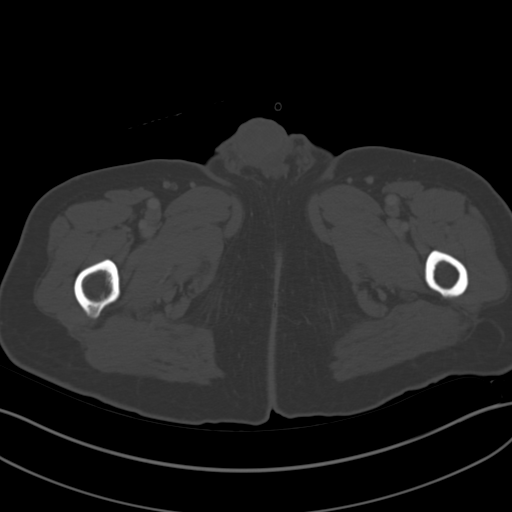
[im 11/93  soft-tissue]
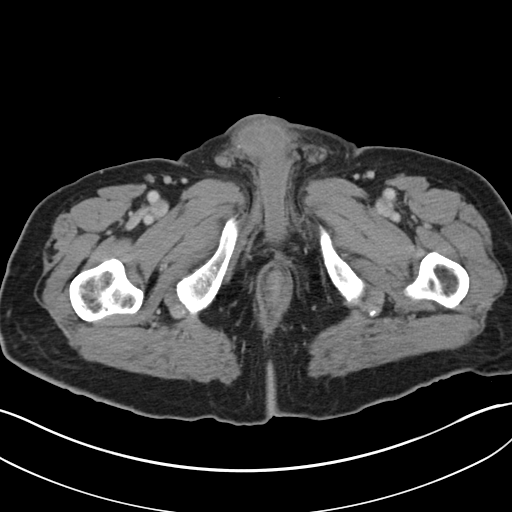
[im 21/93  soft-tissue]
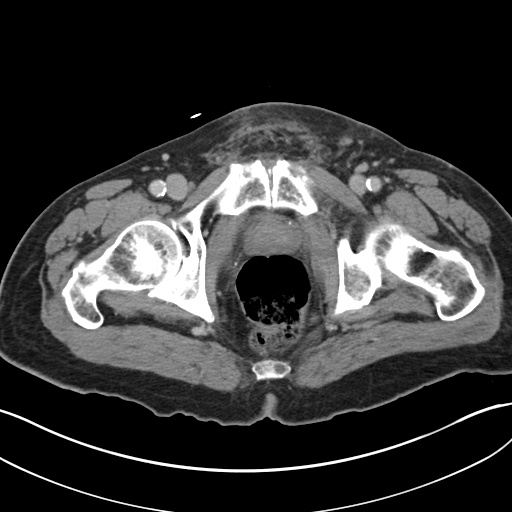
[im 26/93  soft-tissue]
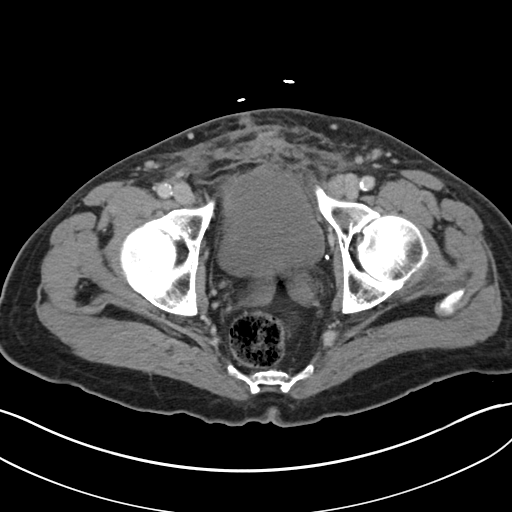
[im 31/93  soft-tissue]
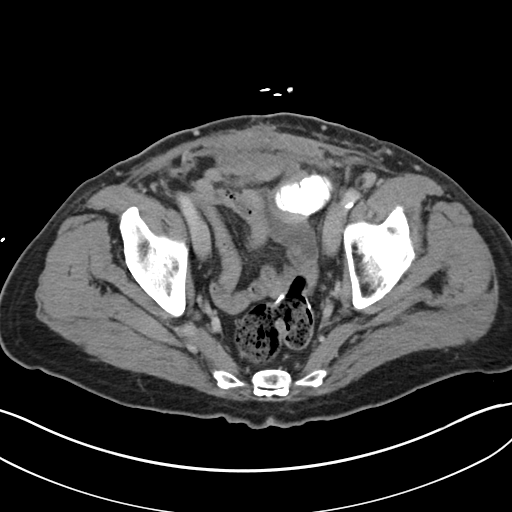
[im 41/93  soft-tissue]
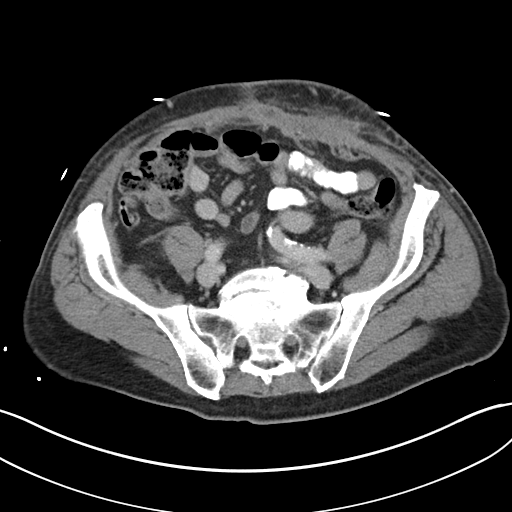
[im 47/93  soft-tissue]
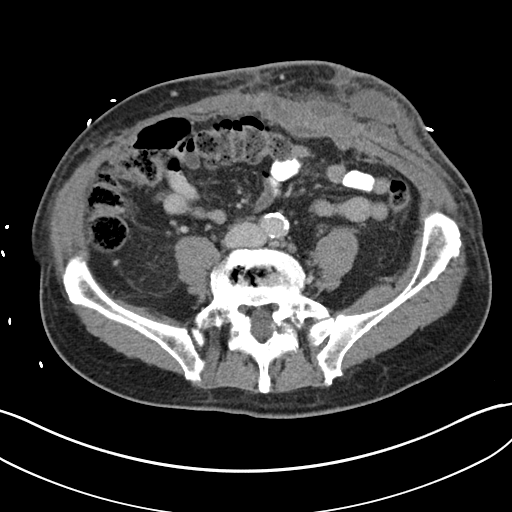
[im 52/93  soft-tissue]
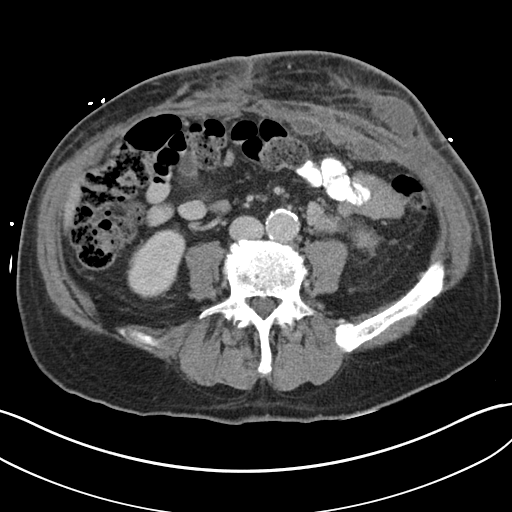
[im 62/93  soft-tissue]
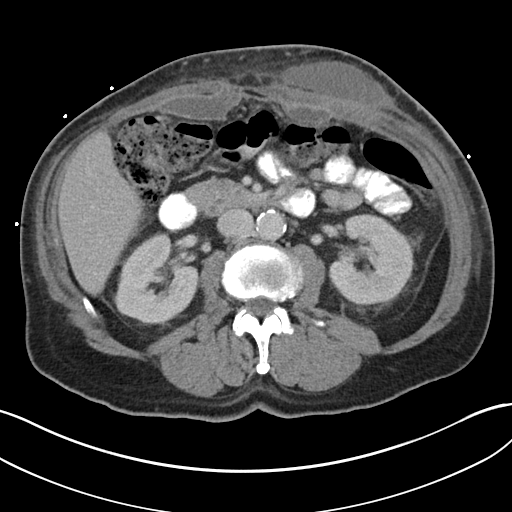
[im 62/93  bone]
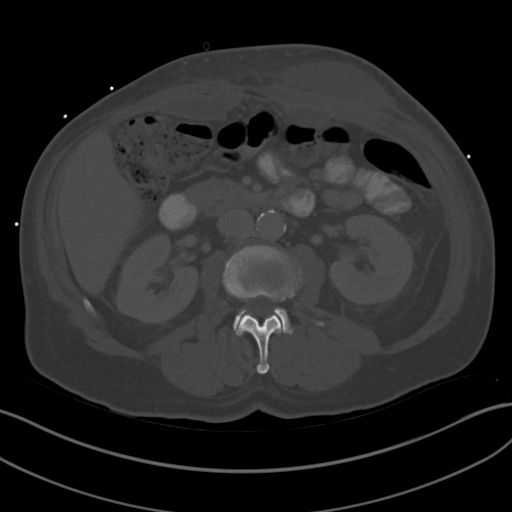
[im 67/93  soft-tissue]
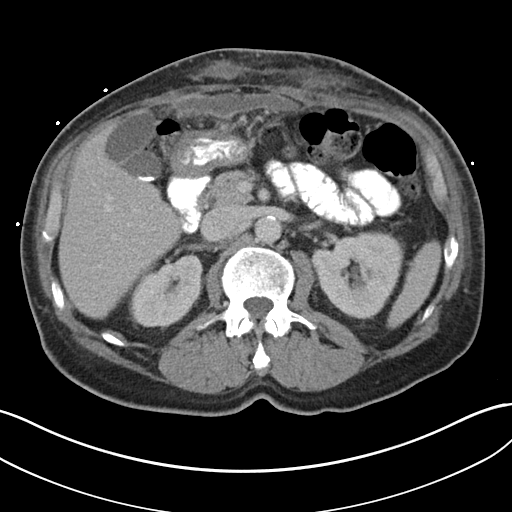
[im 72/93  soft-tissue]
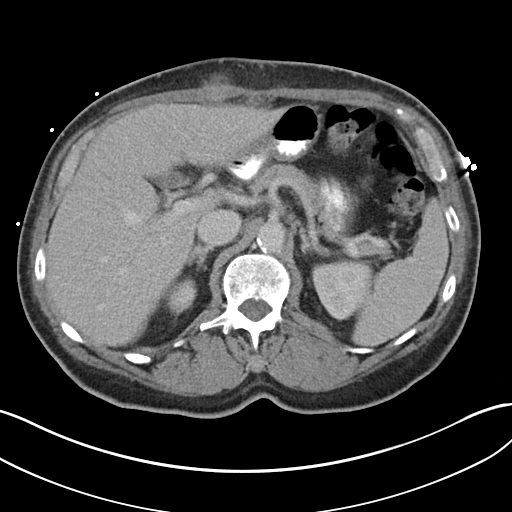
[im 82/93  soft-tissue]
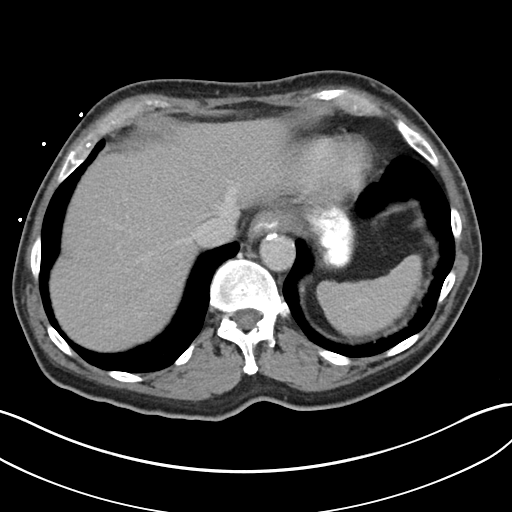
[im 87/93  soft-tissue]
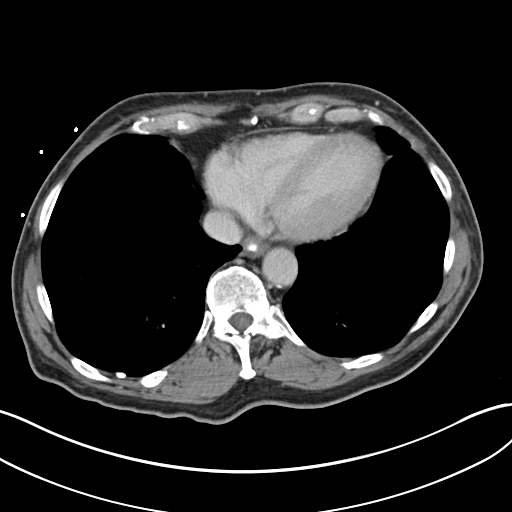

[Series 6: coronal soft tissue · coronal · 0.80mm/px · 3 of 90 slices shown]
[im 30/90  soft-tissue]
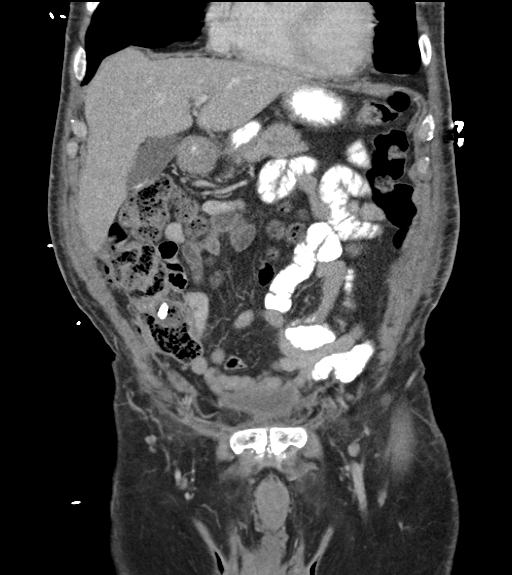
[im 40/90  soft-tissue]
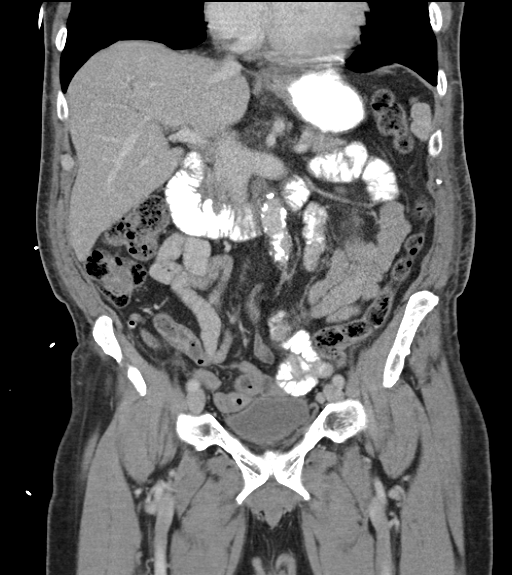
[im 50/90  soft-tissue]
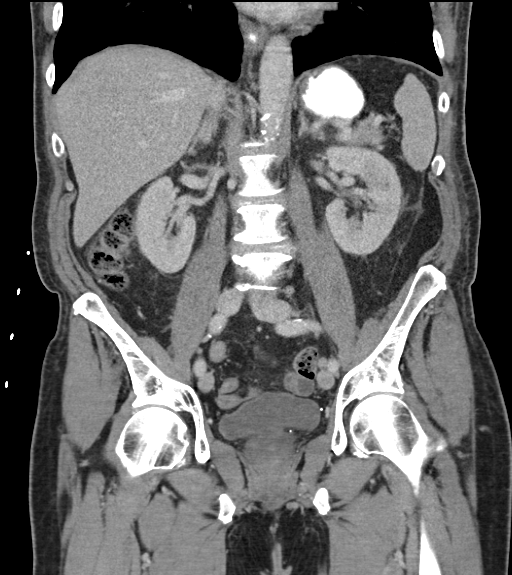

[16 of 46 positions shown; findings below may reference images not displayed]

FINDINGS: BODY WALL: There is a ventral abdominal wall hernia repair using
mesh. The upper margin of the mesh is difficult to discern, but it
is at least contiguous with an irregularly shaped fluid collection
in the anterior peritoneal or extraperitoneal intra-abdominal space.
This peripherally enhancing collection measures at 9 cm in
craniocaudal span by up to 14 cm in transverse span by 1.3 cm in
maximal thickness. The collection decompresses into the subcutaneous
space through defects in the bilateral anterior abdominal wall. The
largest pocket of the subcutaneous fluid collection measures
approximately 7 cm in diameter by 3 cm in thickness. No subcutaneous
gas or evidence of fistula.

LOWER CHEST: Unremarkable.

ABDOMEN/PELVIS:

Liver: No focal abnormality.

Biliary: Cholelithiasis. No evidence for biliary obstruction or
perforation.

Pancreas: Unremarkable.

Spleen: Unremarkable.

Adrenals: Unremarkable.

Kidneys and ureters: No hydronephrosis or stone. 2 cm exophytic cyst
from the left lower pole.

Bladder: Unremarkable.

Reproductive: Unremarkable.

Bowel: No obstruction. Negative appendix. Status post sigmoidectomy.

Retroperitoneum: No mass or adenopathy.

Peritoneum: No ascites or pneumoperitoneum.

Vascular: No acute abnormality.

OSSEOUS: Bilateral L5 pars defects with grade 1-2 anterolisthesis.
IMPRESSION: Extensive fluid collection, likely abscess, both superficial and
deep to the anterior abdominal wall. The collection is contiguous
with mesh used for hernia repair.

## 2015-06-03 IMAGING — US US ABSCESS DRAINAGE W/ CATHETER
1 series · 13 of 14 positions shown · non-contrast
Comparison: none

CLINICAL DATA: 73-year-old male with a history of anterior
abdominal wall hernia repair. He has developed abdominal wall fluid
collections suspicious for abscess.

[Series 1: us abscess drainage w/ catheter · 0.12mm/px · 14 acquisitions, 13 frames shown]
[im 1/14]
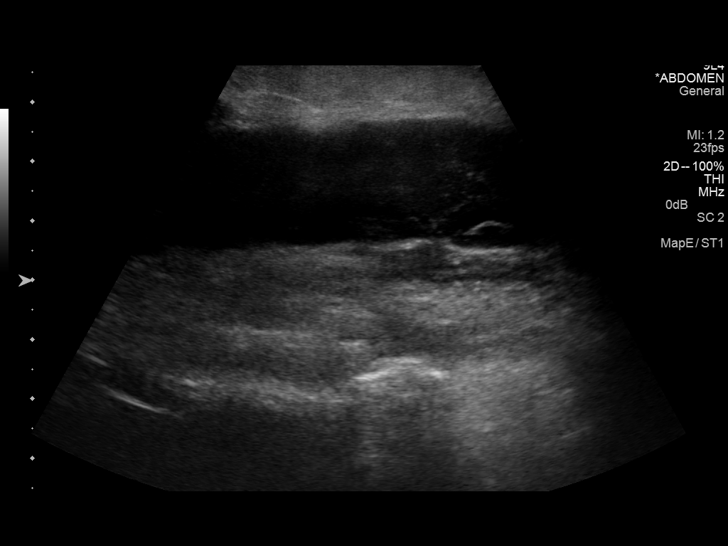
[im 2/14]
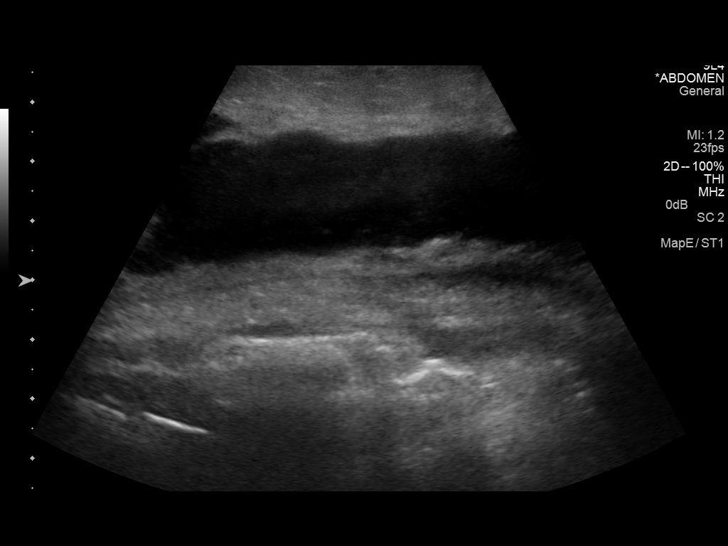
[im 3/14]
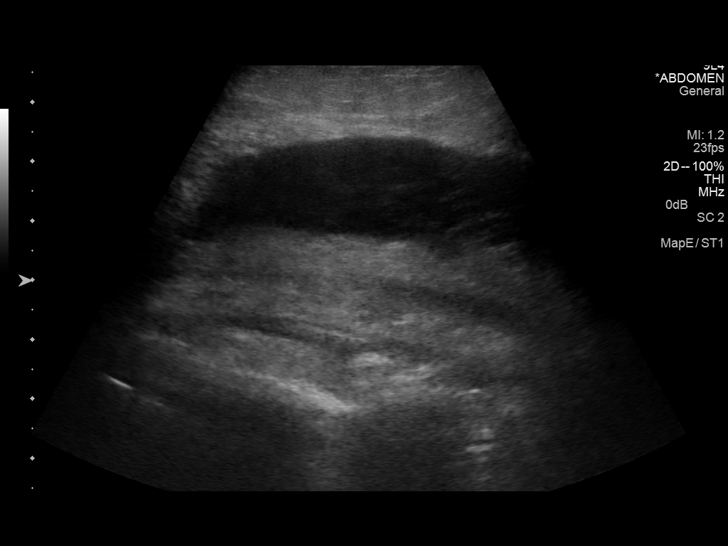
[im 4/14]
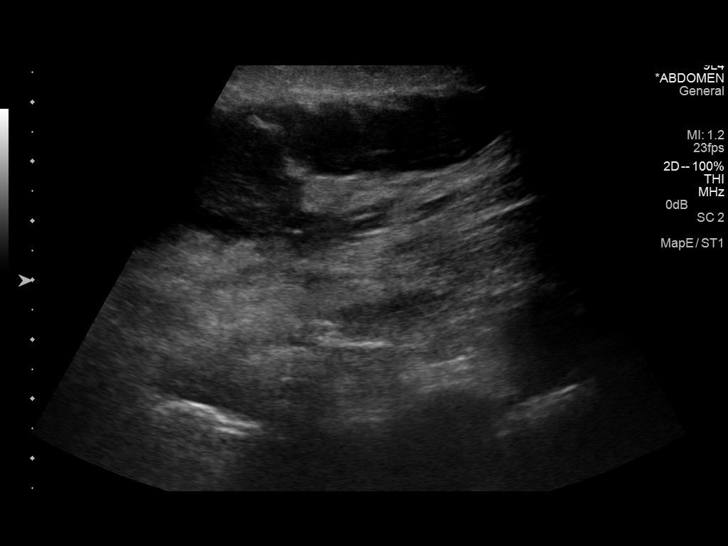
[im 5/14]
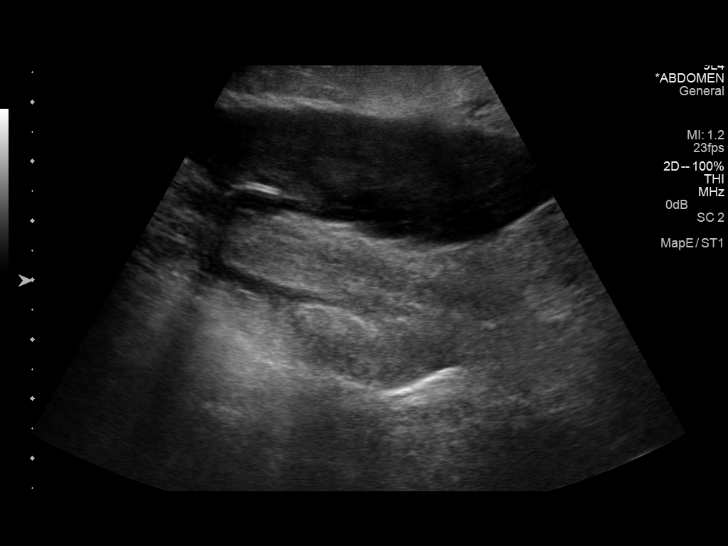
[im 6/14]
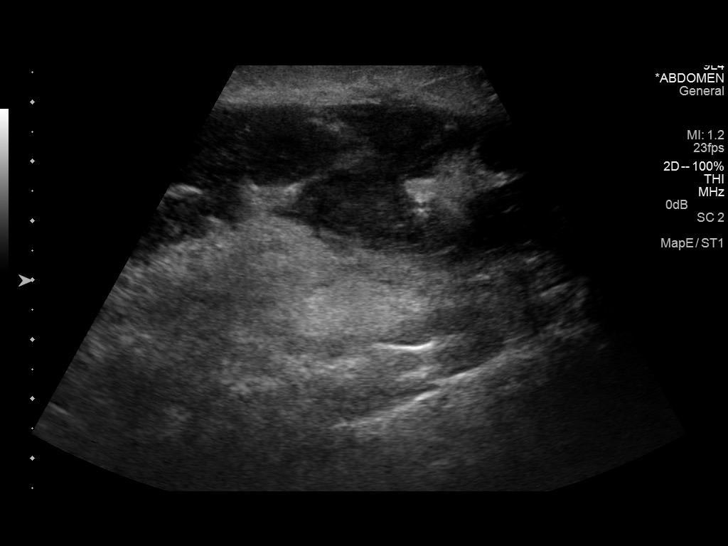
[im 8/14]
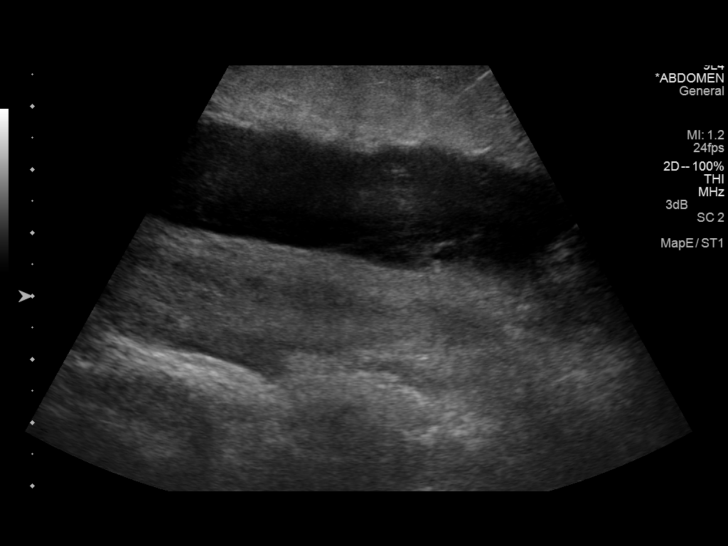
[im 9/14]
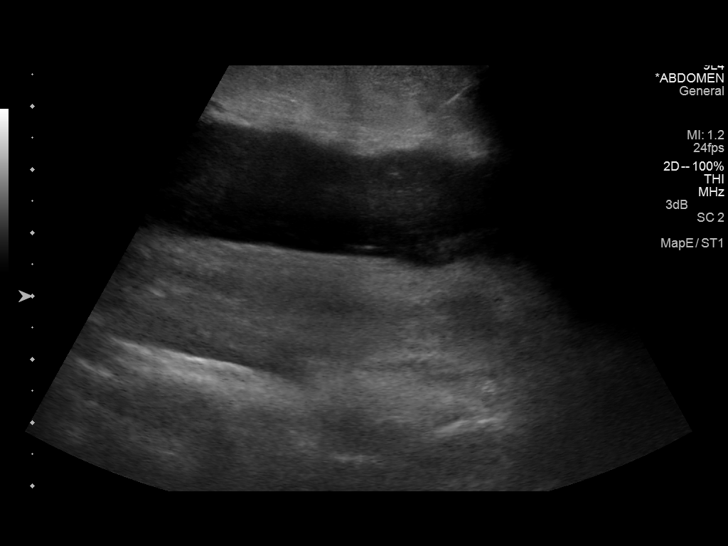
[im 10/14]
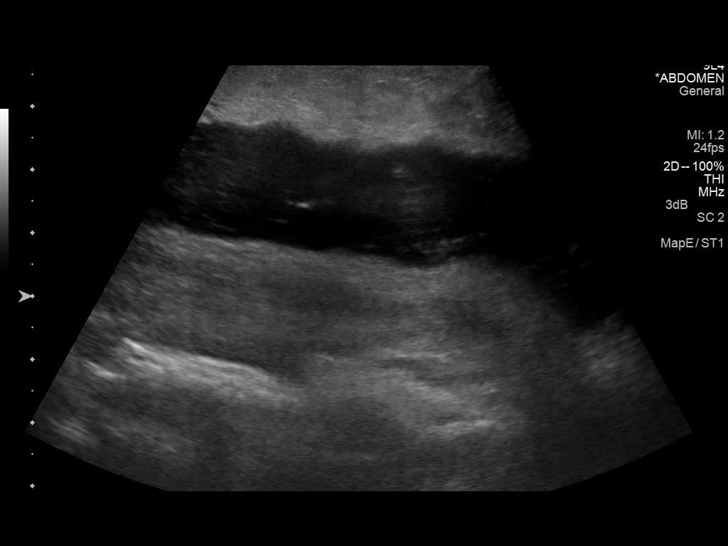
[im 11/14]
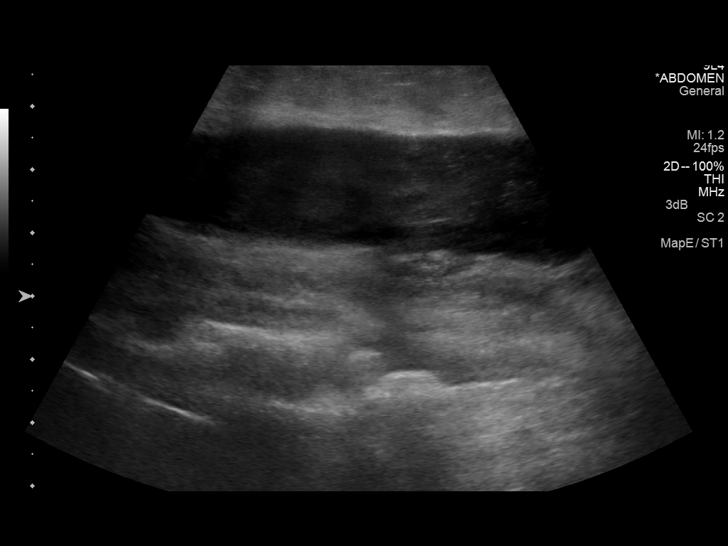
[im 12/14]
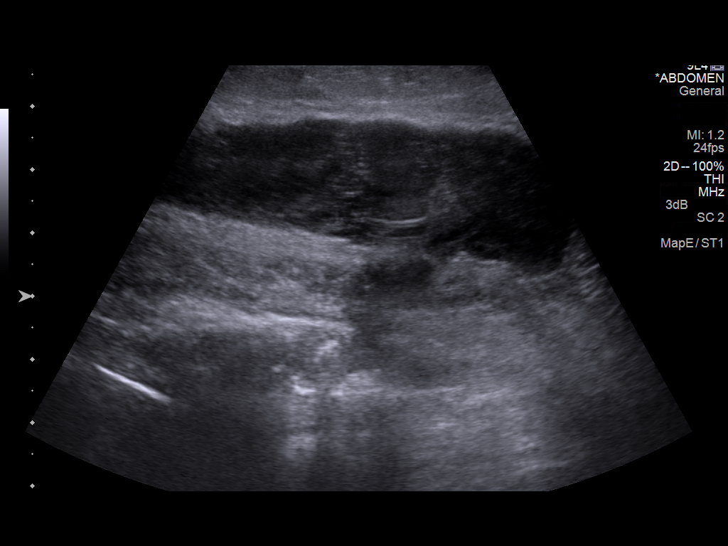
[im 13/14]
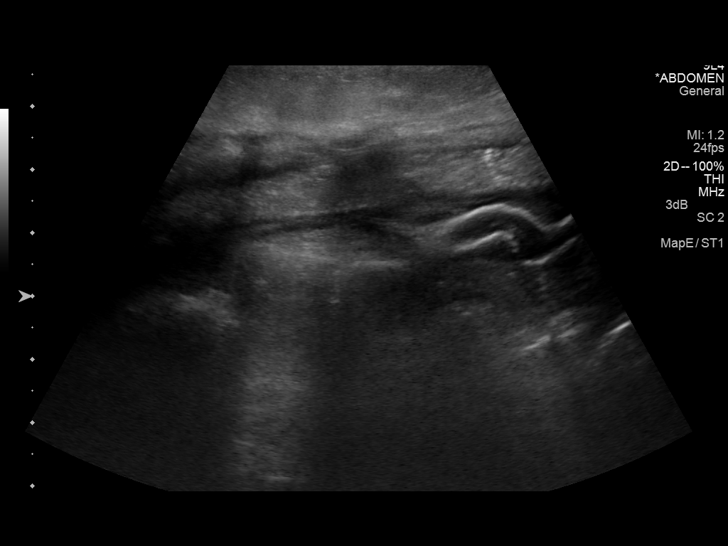
[im 14/14]
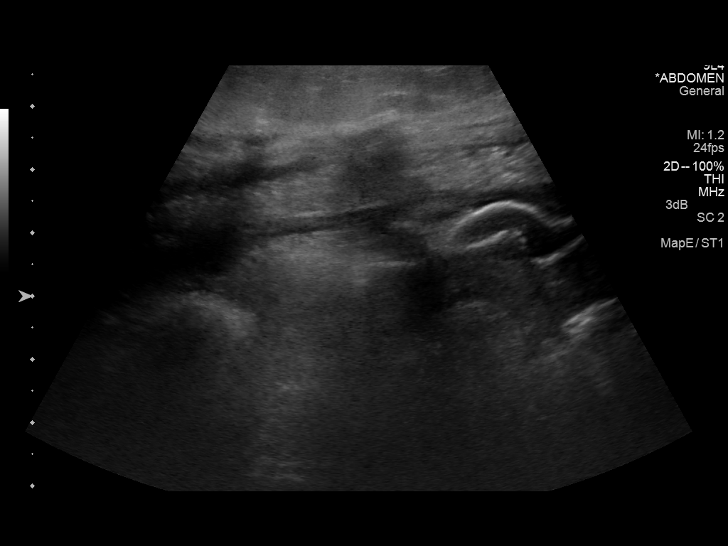

[13 of 14 positions shown; findings below may reference images not displayed]

EXAM:
ULTRASOUND GUIDED DRAINAGE OF ABDOMINAL WALL ABSCESS

MEDICATIONS:
1.0 mg IV Versed; 50 mcg IV Fentanyl

Total Moderate Sedation Time: 18 MIN

PROCEDURE:
The procedure, risks, benefits, and alternatives were explained to
the patient. Questions regarding the procedure were encouraged and
answered. The patient understands and consents to the procedure.

Ultrasound survey of the left abdominal wall was performed for
planning purposes. Image were stored and sent to PACs.

The left anterior abdominal wall was prepped with Betadine in a
sterile fashion, and a sterile drape was applied covering the
operative field. A sterile gown and sterile gloves were used for the
procedure. Local anesthesia was provided with 1% Lidocaine.

After infiltration of the skin and subcutaneous tissues a small stab
incision was made with 11 blade scalpel. A 10 French pigtail drain
was placed into the fluid collection with a trocar technique under
ultrasound guidance. Once a drain was not placed approximately 80 cc
of purulent fluid was removed.

The drain was sutured in position.

The drain was attached to bulb suction.

Final images were stored.

The patient tolerated the procedure well and remained
hemodynamically stable throughout.

No complications were encountered and no significant blood loss was
encounter.

COMPLICATIONS:
None.
FINDINGS: Fluid collection on the anterior abdominal wall.

80 cc of purulent fluid was aspirated.  Sample was sent to the lab.
IMPRESSION: Status post ultrasound-guided drainage of left anterior abdominal
wall abscess with 80 cc of purulent fluid aspirated. Sample was sent
to the lab.

## 2015-06-06 ENCOUNTER — Encounter: Payer: Self-pay | Admitting: Emergency Medicine

## 2015-06-06 ENCOUNTER — Ambulatory Visit (INDEPENDENT_AMBULATORY_CARE_PROVIDER_SITE_OTHER): Payer: PPO | Admitting: Emergency Medicine

## 2015-06-06 VITALS — BP 140/80 | HR 70 | Temp 98.3°F | Resp 16 | Ht 71.0 in | Wt 180.4 lb

## 2015-06-06 DIAGNOSIS — H6123 Impacted cerumen, bilateral: Secondary | ICD-10-CM | POA: Diagnosis not present

## 2015-06-06 DIAGNOSIS — Z125 Encounter for screening for malignant neoplasm of prostate: Secondary | ICD-10-CM

## 2015-06-06 DIAGNOSIS — R3989 Other symptoms and signs involving the genitourinary system: Secondary | ICD-10-CM | POA: Diagnosis not present

## 2015-06-06 DIAGNOSIS — E039 Hypothyroidism, unspecified: Secondary | ICD-10-CM

## 2015-06-06 DIAGNOSIS — Z13220 Encounter for screening for lipoid disorders: Secondary | ICD-10-CM

## 2015-06-06 DIAGNOSIS — I10 Essential (primary) hypertension: Secondary | ICD-10-CM | POA: Diagnosis not present

## 2015-06-06 DIAGNOSIS — H6120 Impacted cerumen, unspecified ear: Secondary | ICD-10-CM

## 2015-06-06 DIAGNOSIS — Z0000 Encounter for general adult medical examination without abnormal findings: Secondary | ICD-10-CM | POA: Diagnosis not present

## 2015-06-06 DIAGNOSIS — R972 Elevated prostate specific antigen [PSA]: Secondary | ICD-10-CM

## 2015-06-06 DIAGNOSIS — R739 Hyperglycemia, unspecified: Secondary | ICD-10-CM | POA: Diagnosis not present

## 2015-06-06 LAB — CBC WITH DIFFERENTIAL/PLATELET
Basophils Absolute: 0 cells/uL (ref 0–200)
Basophils Relative: 0 %
EOS PCT: 4 %
Eosinophils Absolute: 180 cells/uL (ref 15–500)
HEMATOCRIT: 42.7 % (ref 38.5–50.0)
Hemoglobin: 14.3 g/dL (ref 13.2–17.1)
LYMPHS PCT: 22 %
Lymphs Abs: 990 cells/uL (ref 850–3900)
MCH: 31.1 pg (ref 27.0–33.0)
MCHC: 33.5 g/dL (ref 32.0–36.0)
MCV: 92.8 fL (ref 80.0–100.0)
MONO ABS: 495 {cells}/uL (ref 200–950)
MPV: 8.9 fL (ref 7.5–12.5)
Monocytes Relative: 11 %
NEUTROS PCT: 63 %
Neutro Abs: 2835 cells/uL (ref 1500–7800)
Platelets: 253 10*3/uL (ref 140–400)
RBC: 4.6 MIL/uL (ref 4.20–5.80)
RDW: 14.5 % (ref 11.0–15.0)
WBC: 4.5 10*3/uL (ref 3.8–10.8)

## 2015-06-06 LAB — LIPID PANEL
CHOLESTEROL: 159 mg/dL (ref 125–200)
HDL: 40 mg/dL (ref >=40)
LDL Cholesterol: 104 mg/dL (ref <130)
Total CHOL/HDL Ratio: 4 Ratio (ref <=5.0)
Triglycerides: 73 mg/dL (ref <150)
VLDL: 15 mg/dL (ref <30)

## 2015-06-06 LAB — COMPLETE METABOLIC PANEL WITH GFR
ALBUMIN: 4 g/dL (ref 3.6–5.1)
ALT: 14 U/L (ref 9–46)
AST: 23 U/L (ref 10–35)
Alkaline Phosphatase: 60 U/L (ref 40–115)
BUN: 17 mg/dL (ref 7–25)
CALCIUM: 9.3 mg/dL (ref 8.6–10.3)
CHLORIDE: 101 mmol/L (ref 98–110)
CO2: 24 mmol/L (ref 20–31)
CREATININE: 0.86 mg/dL (ref 0.70–1.18)
GFR, Est African American: >89 mL/min (ref >=60)
GFR, Est Non African American: 85 mL/min (ref >=60)
GLUCOSE: 98 mg/dL (ref 65–99)
Potassium: 4.3 mmol/L (ref 3.5–5.3)
SODIUM: 141 mmol/L (ref 135–146)
Total Bilirubin: 0.6 mg/dL (ref 0.2–1.2)
Total Protein: 7.5 g/dL (ref 6.1–8.1)

## 2015-06-06 LAB — POCT URINALYSIS DIP (MANUAL ENTRY)
Bilirubin, UA: NEGATIVE
Glucose, UA: NEGATIVE
Ketones, POC UA: NEGATIVE
Leukocytes, UA: NEGATIVE
Nitrite, UA: NEGATIVE
PROTEIN UA: NEGATIVE
SPEC GRAV UA: 1.02
UROBILINOGEN UA: 0.2
pH, UA: 5.5

## 2015-06-06 LAB — TSH: TSH: 3.44 m[IU]/L (ref 0.40–4.50)

## 2015-06-06 NOTE — Patient Instructions (Addendum)
   IF you received an x-ray today, you will receive an invoice from [facility]. Please contact [facility] at [phone] with questions or concerns regarding your invoice.   IF you received labwork today, you will receive an invoice from [facility]. Please contact [facility] at [phone] with questions or concerns regarding your invoice.   Our billing staff will not be able to assist you with questions regarding bills from these companies.  You will be contacted with the lab results as soon as they are available. The fastest way to get your results is to activate your My Chart account. Instructions are located on the last page of this paperwork. If you have not heard from us regarding the results in 2 weeks, please contact this office.    Health Maintenance, Male A healthy lifestyle and preventative care can promote health and wellness.  Maintain regular health, dental, and eye exams.  Eat a healthy diet. Foods like vegetables, fruits, whole grains, low-fat dairy products, and lean protein foods contain the nutrients you need and are low in calories. Decrease your intake of foods high in solid fats, added sugars, and salt. Get information about a proper diet from your health care provider, if necessary.  Regular physical exercise is one of the most important things you can do for your health. Most adults should get at least 150 minutes of moderate-intensity exercise (any activity that increases your heart rate and causes you to sweat) each week. In addition, most adults need muscle-strengthening exercises on 2 or more days a week.   Maintain a healthy weight. The body mass index (BMI) is a screening tool to identify possible weight problems. It provides an estimate of body fat based on height and weight. Your health care provider can find your BMI and can help you achieve or maintain a healthy weight. For males 20 years and older:  A BMI  below 18.5 is considered underweight.  A BMI of 18.5 to 24.9 is normal.  A BMI of 25 to 29.9 is considered overweight.  A BMI of 30 and above is considered obese.  Maintain normal blood lipids and cholesterol by exercising and minimizing your intake of saturated fat. Eat a balanced diet with plenty of fruits and vegetables. Blood tests for lipids and cholesterol should begin at age 20 and be repeated every 5 years. If your lipid or cholesterol levels are high, you are over age 50, or you are at high risk for heart disease, you may need your cholesterol levels checked more frequently.Ongoing high lipid and cholesterol levels should be treated with medicines if diet and exercise are not working.  If you smoke, find out from your health care provider how to quit. If you do not use tobacco, do not start.  Lung cancer screening is recommended for adults aged 55-80 years who are at high risk for developing lung cancer because of a history of smoking. A yearly low-dose CT scan of the lungs is recommended for people who have at least a 30-pack-year history of smoking and are current smokers or have quit within the past 15 years. A pack year of smoking is smoking an average of 1 pack of cigarettes a day for 1 year (for example, a 30-pack-year history of smoking could mean smoking 1 pack a day for 30 years or 2 packs a day for 15 years). Yearly screening should continue until the smoker has stopped smoking for at least 15 years. Yearly screening should be stopped   for people who develop a health problem that would prevent them from having lung cancer treatment.  If you choose to drink alcohol, do not have more than 2 drinks per day. One drink is considered to be 12 oz (360 mL) of beer, 5 oz (150 mL) of wine, or 1.5 oz (45 mL) of liquor.  Avoid the use of street drugs. Do not share needles with anyone. Ask for help if you need support or instructions about stopping the use of drugs.  High blood pressure  causes heart disease and increases the risk of stroke. High blood pressure is more likely to develop in:  People who have blood pressure in the end of the normal range (100-139/85-89 mm Hg).  People who are overweight or obese.  People who are African American.  If you are 18-39 years of age, have your blood pressure checked every 3-5 years. If you are 40 years of age or older, have your blood pressure checked every year. You should have your blood pressure measured twice--once when you are at a hospital or clinic, and once when you are not at a hospital or clinic. Record the average of the two measurements. To check your blood pressure when you are not at a hospital or clinic, you can use:  An automated blood pressure machine at a pharmacy.  A home blood pressure monitor.  If you are 45-79 years old, ask your health care provider if you should take aspirin to prevent heart disease.  Diabetes screening involves taking a blood sample to check your fasting blood sugar level. This should be done once every 3 years after age 45 if you are at a normal weight and without risk factors for diabetes. Testing should be considered at a younger age or be carried out more frequently if you are overweight and have at least 1 risk factor for diabetes.  Colorectal cancer can be detected and often prevented. Most routine colorectal cancer screening begins at the age of 50 and continues through age 75. However, your health care provider may recommend screening at an earlier age if you have risk factors for colon cancer. On a yearly basis, your health care provider may provide home test kits to check for hidden blood in the stool. A small camera at the end of a tube may be used to directly examine the colon (sigmoidoscopy or colonoscopy) to detect the earliest forms of colorectal cancer. Talk to your health care provider about this at age 50 when routine screening begins. A direct exam of the colon should be repeated  every 5-10 years through age 75, unless early forms of precancerous polyps or small growths are found.  People who are at an increased risk for hepatitis B should be screened for this virus. You are considered at high risk for hepatitis B if:  You were born in a country where hepatitis B occurs often. Talk with your health care provider about which countries are considered high risk.  Your parents were born in a high-risk country and you have not received a shot to protect against hepatitis B (hepatitis B vaccine).  You have HIV or AIDS.  You use needles to inject street drugs.  You live with, or have sex with, someone who has hepatitis B.  You are a man who has sex with other men (MSM).  You get hemodialysis treatment.  You take certain medicines for conditions like cancer, organ transplantation, and autoimmune conditions.  Hepatitis C blood testing is recommended for   all people born from 1945 through 1965 and any individual with known risk factors for hepatitis C.  Healthy men should no longer receive prostate-specific antigen (PSA) blood tests as part of routine cancer screening. Talk to your health care provider about prostate cancer screening.  Testicular cancer screening is not recommended for adolescents or adult males who have no symptoms. Screening includes self-exam, a health care provider exam, and other screening tests. Consult with your health care provider about any symptoms you have or any concerns you have about testicular cancer.  Practice safe sex. Use condoms and avoid high-risk sexual practices to reduce the spread of sexually transmitted infections (STIs).  You should be screened for STIs, including gonorrhea and chlamydia if:  You are sexually active and are younger than 24 years.  You are older than 24 years, and your health care provider tells you that you are at risk for this type of infection.  Your sexual activity has changed since you were last screened,  and you are at an increased risk for chlamydia or gonorrhea. Ask your health care provider if you are at risk.  If you are at risk of being infected with HIV, it is recommended that you take a prescription medicine daily to prevent HIV infection. This is called pre-exposure prophylaxis (PrEP). You are considered at risk if:  You are a man who has sex with other men (MSM).  You are a heterosexual man who is sexually active with multiple partners.  You take drugs by injection.  You are sexually active with a partner who has HIV.  Talk with your health care provider about whether you are at high risk of being infected with HIV. If you choose to begin PrEP, you should first be tested for HIV. You should then be tested every 3 months for as long as you are taking PrEP.  Use sunscreen. Apply sunscreen liberally and repeatedly throughout the day. You should seek shade when your shadow is shorter than you. Protect yourself by wearing long sleeves, pants, a wide-brimmed hat, and sunglasses year round whenever you are outdoors.  Tell your health care provider of new moles or changes in moles, especially if there is a change in shape or color. Also, tell your health care provider if a mole is larger than the size of a pencil eraser.  A one-time screening for abdominal aortic aneurysm (AAA) and surgical repair of large AAAs by ultrasound is recommended for men aged 65-75 years who are current or former smokers.  Stay current with your vaccines (immunizations).   This information is not intended to replace advice given to you by your health care provider. Make sure you discuss any questions you have with your health care provider.   Document Released: 07/11/2007 Document Revised: 02/02/2014 Document Reviewed: 06/09/2010 Elsevier Interactive Patient Education 2016 Elsevier Inc.  

## 2015-06-06 NOTE — Progress Notes (Signed)
By signing my name below, I, Mesha Guinyard, attest that this documentation has been prepared under the direction and in the presence of [name], MD.  Electronically Signed: Verlee Monte, Medical Scribe. 06/06/2015. 11:12 AM.  Chief Complaint: No chief complaint on file.  HPI: Preston Reeves is a 75 y.o. male who reports to [facility] today for an physical. Pt reports feeling fine overall and couldn't think of any symptoms to report. Pt mentions his last colonoscopy was last year with a recheck in 5 years. Pt reports having colostomy reversal surgery and had to go back because of incisional hernias. Pt recalls having the regular PNA vaccine through someone in [place]. Pt reports going to the ophthalmologist once a year. Pt denies having eye surgery. Pt mentions having small ear canal and wants to get it flushed out. Pt golfs in his free time.  Past Medical History  Diagnosis Date  . MVP (mitral valve prolapse)   . Diverticulosis     hx of  . History of BPH   . Hemochromatosis     hx of  . History of COPD   . Thyroid disease   . Incisional hernia   . Hyperlipidemia   . Diverticulitis of colon with perforation 2010    Colectomy/ostomy  . Colon polyps     Adenomatous  . Allergy     SEASONAL  . Cataract   . Hypothyroidism    Past Surgical History  Procedure Laterality Date  . Inguinal hernia repair Left 1999    Dr. Rebekah Chesterfield  . Partial colectomy  08/12/2008    Jeanette Caprice  . Colostomy takedown  2010    Dr Barry Dienes  . Colonoscopy w/ biopsies  2011    Dr Henrene Pastor  . Colonoscopy    . Upper gi endoscopy    . Ventral hernia repair N/A 10/13/2013    Procedure: REPAIR OF VENTRAL WALL HERNIA WITH UNILATERAL COMPARTMENT SEPARATION;  Surgeon: Michael Boston, MD;  Location: [place];  Service: General;  Laterality: N/A;  . Insertion of mesh N/A 10/13/2013    Procedure: INSERTION OF MESH;  Surgeon: Michael Boston, MD;  Location: [place];  Service: General;  Laterality: N/A;  . Laparoscopic lysis of  adhesions N/A 10/13/2013    Procedure: LAPAROSCOPIC LYSIS OF ADHESIONS;  Surgeon: Michael Boston, MD;  Location: [place];  Service: General;  Laterality: N/A;   Social History   Social History  . Marital Status: Married    Spouse Name: N/A  . Number of Children: N/A  . Years of Education: N/A   Occupational History  . Retired    Social History Main Topics  . Smoking status: Former Smoker    Types: Cigarettes  . Smokeless tobacco: Never Used  . Alcohol Use: 0.0 oz/week    0 Standard drinks or equivalent per week     Comment: drinks beer weekly  . Drug Use: No  . Sexual Activity: Yes   Other Topics Concern  . None   Social History Narrative   Married. Education: [employer].   Family History  Problem Relation Age of Onset  . Breast cancer Mother   . Colon cancer Father   . Heart disease Father   . Diabetes Father   . Heart attack Father   . Stroke Neg Hx    Not on File Prior to Admission medications   Medication Sig Start Date End Date Taking? Authorizing Provider  acetaminophen (TYLENOL) 500 MG tablet Take 100 mg by mouth every 8 (  eight) hours as needed for fever.    Historical Provider, MD  Ascorbic Acid (VITAMIN C) 1000 MG tablet Take 1,000 mg by mouth daily.      Historical Provider, MD  aspirin 81 MG tablet Take 81 mg by mouth 2 (two) times daily.     Historical Provider, MD  Biotin 10 MG CAPS Take 10 mg by mouth daily.     Historical Provider, MD  Calcium Carbonate-Vitamin D (CALCIUM + D PO) Take 1 tablet by mouth daily.     Historical Provider, MD  Cholecalciferol (VITAMIN D) 2000 UNITS CAPS Take 2,000 Units by mouth daily.     Historical Provider, MD  fexofenadine-pseudoephedrine (ALLEGRA-D 24) 180-240 MG per 24 hr tablet Take 1 tablet by mouth daily.    Historical Provider, MD  FLUZONE HIGH-DOSE 0.5 ML SUSY ADM 0.5ML IM UTD 10/28/14   Historical Provider, MD  Glucosamine 500 MG CAPS Take 500 mg by mouth daily.     Historical Provider, MD  levothyroxine (SYNTHROID,  LEVOTHROID) 150 MCG tablet TAKE 1 TABLET(150 MCG) BY MOUTH DAILY BEFORE BREAKFAST 04/23/15   Elayne Snare, MD  loratadine (CLARITIN) 10 MG tablet Take 10 mg by mouth daily as needed for allergies. As needed    Historical Provider, MD  Methylcellulose, Laxative, (CITRUCEL PO) Take 2 tablets by mouth 2 (two) times daily.     Historical Provider, MD  metoprolol succinate (TOPROL-XL) 25 MG 24 hr tablet Take 1 tablet (25 mg total) by mouth daily. 01/11/15   Darlin Coco, MD  Multiple Vitamins-Minerals (MULTIVITAMIN PO) Take 1 tablet by mouth daily.     Historical Provider, MD  Omega-3 Fatty Acids (FISH OIL PO) Take 900 mg by mouth daily. Taking 900 daily    Historical Provider, MD  Red Yeast Rice 600 MG CAPS Take 1,200 mg by mouth daily.     Historical Provider, MD  SALINE NASAL SPRAY NA Place 1 spray into the nose daily as needed (allergies).     Historical Provider, MD  vitamin B-12 (CYANOCOBALAMIN) 1000 MCG tablet Take 1,000 mcg by mouth daily.    Historical Provider, MD    ROS: The patient denies fevers, chills, night sweats, unintentional weight loss, chest pain, palpitations, wheezing, dyspnea on exertion, nausea, vomiting, abdominal pain, dysuria, hematuria, melena, numbness, weakness, or tingling.  All other systems have been reviewed and were otherwise negative with the exception of those mentioned in the HPI and as above.    PHYSICAL EXAM: Filed Vitals:   06/06/15 1108  BP: 163/80  Pulse: 70  Temp: 98.3 F (36.8 C)  Resp: 16   BP recheck was 120/80  Body mass index is 25.17 kg/(m^2).  General: Alert, no acute distress HEENT:  Normocephalic, atraumatic, oropharynx patent. Ear wax in both ears Eye: [facility], [facility] Cardiovascular:  Regular rate and rhythm, no rubs murmurs or gallops.  No Carotid bruits, radial pulse intact. No pedal edema.  Respiratory: Clear to auscultation bilaterally.  No wheezes, rales, or rhonchi.  No cyanosis, no use of accessory musculature Abdominal: No  organomegaly, abdomen is soft and non-tender, positive bowel sounds.  No masses. Healed midline scar. Scared lower left abdomen from previous colostomy. GU: Midline there is a firm area centrally 4-5 mm in size Musculoskeletal: Gait intact. No edema, tenderness Skin: No rashes. Neurologic: Facial musculature symmetric. Psychiatric: Patient acts appropriately throughout our interaction. Lymphatic: No cervical or submandibular lymphadenopathy  LABS: Results for orders placed or performed in visit on 06/06/15  POCT urinalysis dipstick  Result Value  Ref Range   Color, UA yellow yellow   Clarity, UA clear clear   Glucose, UA negative negative   Bilirubin, UA negative negative   Ketones, POC UA negative negative   Spec Grav, UA 1.020    Blood, UA trace-intact (A) negative   pH, UA 5.5    Protein Ur, POC negative negative   Urobilinogen, UA 0.2    Nitrite, UA Negative Negative   Leukocytes, UA Negative Negative     EKG/XRAY:   Primary read interpreted by Dr. Everlene Farrier at [facility].   ASSESSMENT/PLAN: Patient has a healthy lifestyle. He does have a questionable abnormal area on prostate exam with a possible nodule medially. He is under the care of Dr. Dwyane Dee for thyroid disease he has a cardiologist to see him. He sees his eye doctor regularly and is up-to-date on colon check. Referral made to urology. He needs a yearly physical exam.   Gross sideeffects, risk and benefits, and alternatives of medications d/w patient. Patient is aware that all medications have potential sideeffects and we are unable to predict every sideeffect or drug-drug interaction that may occur.  [name] MD 06/06/2015 11:12 AM

## 2015-06-07 LAB — PSA, MEDICARE: PSA: 3.95 ng/mL (ref <=4.00)

## 2015-07-01 ENCOUNTER — Other Ambulatory Visit (INDEPENDENT_AMBULATORY_CARE_PROVIDER_SITE_OTHER): Payer: PPO | Admitting: *Deleted

## 2015-07-01 DIAGNOSIS — I10 Essential (primary) hypertension: Secondary | ICD-10-CM

## 2015-07-01 LAB — BASIC METABOLIC PANEL
BUN: 18 mg/dL (ref 7–25)
CO2: 27 mmol/L (ref 20–31)
Calcium: 9.2 mg/dL (ref 8.6–10.3)
Chloride: 102 mmol/L (ref 98–110)
Creat: 0.87 mg/dL (ref 0.70–1.18)
Glucose, Bld: 100 mg/dL — ABNORMAL HIGH (ref 65–99)
Potassium: 4.1 mmol/L (ref 3.5–5.3)
Sodium: 138 mmol/L (ref 135–146)

## 2015-07-01 LAB — HEPATIC FUNCTION PANEL
ALT: 12 U/L (ref 9–46)
AST: 21 U/L (ref 10–35)
Albumin: 3.9 g/dL (ref 3.6–5.1)
Alkaline Phosphatase: 62 U/L (ref 40–115)
Bilirubin, Direct: 0.1 mg/dL (ref <=0.2)
Indirect Bilirubin: 0.4 mg/dL (ref 0.2–1.2)
Total Bilirubin: 0.5 mg/dL (ref 0.2–1.2)
Total Protein: 7.1 g/dL (ref 6.1–8.1)

## 2015-07-01 LAB — LIPID PANEL
Cholesterol: 147 mg/dL (ref 125–200)
HDL: 42 mg/dL (ref >=40)
LDL Cholesterol: 91 mg/dL (ref <130)
Total CHOL/HDL Ratio: 3.5 Ratio (ref <=5.0)
Triglycerides: 72 mg/dL (ref <150)
VLDL: 14 mg/dL (ref <30)

## 2015-07-01 NOTE — Addendum Note (Signed)
Addended by: Eulis Foster on: 07/01/2015 08:04 AM   Modules accepted: Orders

## 2015-07-01 NOTE — Addendum Note (Signed)
Addended by: Eulis Foster on: 07/01/2015 07:51 AM   Modules accepted: Orders

## 2015-07-01 NOTE — Addendum Note (Signed)
Addended by: Eulis Foster on: 07/01/2015 08:03 AM   Modules accepted: Orders

## 2015-07-03 ENCOUNTER — Encounter: Payer: Self-pay | Admitting: Cardiology

## 2015-07-03 ENCOUNTER — Ambulatory Visit (INDEPENDENT_AMBULATORY_CARE_PROVIDER_SITE_OTHER): Payer: PPO | Admitting: Cardiology

## 2015-07-03 VITALS — BP 140/82 | HR 82 | Resp 18 | Ht 71.0 in | Wt 182.0 lb

## 2015-07-03 DIAGNOSIS — I10 Essential (primary) hypertension: Secondary | ICD-10-CM | POA: Diagnosis not present

## 2015-07-03 DIAGNOSIS — I341 Nonrheumatic mitral (valve) prolapse: Secondary | ICD-10-CM | POA: Diagnosis not present

## 2015-07-03 DIAGNOSIS — E7800 Pure hypercholesterolemia, unspecified: Secondary | ICD-10-CM

## 2015-07-03 DIAGNOSIS — Z8709 Personal history of other diseases of the respiratory system: Secondary | ICD-10-CM | POA: Diagnosis not present

## 2015-07-03 NOTE — Progress Notes (Signed)
Cardiology Office Note   Date:  07/03/2015   ID:  Preston Reeves, DOB 05/19/1940, MRN [id number]  PCP:  Jenny Reichmann, MD  Cardiologist: Darlin Coco MD  Chief complain: 6 months follow up, transition from Dr Mare Ferrari   History of Present Illness: Preston Reeves is a 75 y.o. male who presents for a six-month follow-up visit. The patient is being followed for mitral valve prolapse, hypertension hyperlipidemia. He also has a history of thyrotoxicosis, followed by Dr. Dwyane Dee. He has a history of hemochromatosis. He has a history of mitral valve prolapse. Since last visit he has had a lot of gastrointestinal problems. In 2005 he had surgery for a ruptured diverticular abscess. At that time he had a temporary colostomy which was subsequently closed. Over the past 10 years he had developed multiple abdominal hernias. He went to surgery on 10/13/13 by Dr. Johney Maine. Laparoscopic surgery was not possible and he had an open procedure.  The patient has a history of mitral valve prolapse.   The patient is generally very active he plays calls twice a week, 18 holes, he denies any chest pain shortness of breath palpitation or syncope with that. He denies any orthopnea, paroxysmal nocturnal dyspnea, he is tolerating red yeast rice and generally has healthy diet.  He has distant history of smoking, currently doesn't smoke,.  Past Medical History  Diagnosis Date  . MVP (mitral valve prolapse)   . Diverticulosis     hx of  . History of BPH   . Hemochromatosis     hx of  . History of COPD   . Thyroid disease   . Incisional hernia   . Hyperlipidemia   . Diverticulitis of colon with perforation 2010    Colectomy/ostomy  . Colon polyps     Adenomatous  . Allergy     SEASONAL  . Cataract   . Hypothyroidism     Past Surgical History  Procedure Laterality Date  . Inguinal hernia repair Left 1999    Dr. Rebekah Chesterfield  . Partial colectomy  08/12/2008    Jeanette Caprice  . Colostomy takedown  2010   Dr Barry Dienes  . Colonoscopy w/ biopsies  2011    Dr Henrene Pastor  . Colonoscopy    . Upper gi endoscopy    . Ventral hernia repair N/A 10/13/2013    Procedure: REPAIR OF VENTRAL WALL HERNIA WITH UNILATERAL COMPARTMENT SEPARATION;  Surgeon: Michael Boston, MD;  Location: [facility];  Service: General;  Laterality: N/A;  . Insertion of mesh N/A 10/13/2013    Procedure: INSERTION OF MESH;  Surgeon: Michael Boston, MD;  Location: [facility];  Service: General;  Laterality: N/A;  . Laparoscopic lysis of adhesions N/A 10/13/2013    Procedure: LAPAROSCOPIC LYSIS OF ADHESIONS;  Surgeon: Michael Boston, MD;  Location: [facility];  Service: General;  Laterality: N/A;     Current Outpatient Prescriptions  Medication Sig Dispense Refill  . acetaminophen (TYLENOL) 500 MG tablet Take 100 mg by mouth every 8 (eight) hours as needed for fever.    . Ascorbic Acid (VITAMIN C) 1000 MG tablet Take 1,000 mg by mouth daily.      Marland Kitchen aspirin 81 MG tablet Take 81 mg by mouth 2 (two) times daily.     . Biotin 10 MG CAPS Take 10 mg by mouth daily.     . Calcium Carbonate-Vitamin D (CALCIUM + D PO) Take 1 tablet by mouth daily.     . Cholecalciferol (VITAMIN D) 2000 UNITS CAPS  Take 2,000 Units by mouth daily.     . fexofenadine-pseudoephedrine (ALLEGRA-D 24) 180-240 MG per 24 hr tablet Take 1 tablet by mouth daily.    Marland Kitchen levothyroxine (SYNTHROID, LEVOTHROID) 150 MCG tablet TAKE 1 TABLET(150 MCG) BY MOUTH DAILY BEFORE BREAKFAST 90 tablet 2  . loratadine (CLARITIN) 10 MG tablet Take 10 mg by mouth daily as needed for allergies. As needed    . Methylcellulose, Laxative, (CITRUCEL PO) Take 2 tablets by mouth 2 (two) times daily.     . metoprolol succinate (TOPROL-XL) 25 MG 24 hr tablet Take 1 tablet (25 mg total) by mouth daily. 90 tablet 3  . Multiple Vitamins-Minerals (MULTIVITAMIN PO) Take 1 tablet by mouth daily.     . Omega-3 Fatty Acids (FISH OIL PO) Take 900 mg by mouth daily. Taking 900 daily    . Red Yeast Rice 600 MG CAPS Take 1,200 mg by mouth  daily.     Marland Kitchen SALINE NASAL SPRAY NA Place 1 spray into the nose daily as needed (allergies).     . vitamin B-12 (CYANOCOBALAMIN) 1000 MCG tablet Take 1,000 mcg by mouth daily.    Marland Kitchen FLUZONE HIGH-DOSE 0.5 ML SUSY Reported on 07/03/2015  0  . Glucosamine 500 MG CAPS Take 500 mg by mouth daily. Reported on 07/03/2015     No current facility-administered medications for this visit.    Allergies:   Review of patient's allergies indicates not on file.    Social History:  The patient  reports that he has quit smoking. His smoking use included Cigarettes. He has never used smokeless tobacco. He reports that he drinks alcohol. He reports that he does not use illicit drugs.   Family History:  The patient's family history includes Breast cancer in his mother; Colon cancer in his father; Diabetes in his father; Heart attack in his father; Heart disease in his father. There is no history of Stroke.    ROS:  Please see the history of present illness.   Otherwise, review of systems are positive for none.   All other systems are reviewed and negative.    PHYSICAL EXAM: VS:  BP 140/82 mmHg  Pulse 82  Resp 18  Ht 5\' 11"  (1.803 m)  Wt 182 lb (82.555 kg)  BMI 25.40 kg/m2  SpO2 98% , BMI Body mass index is 25.4 kg/(m^2). GEN: Well nourished, well developed, in no acute distress HEENT: normal Neck: no JVD, carotid bruits, or masses Cardiac: RRR; there is a soft apical systolic murmur. no, rubs, or gallops,no edema  Respiratory:  clear to auscultation bilaterally, normal work of breathing GI: soft, nontender, nondistended, + BS MS: no deformity or atrophy Skin: warm and dry, no rash Neuro:  Strength and sensation are intact Psych: euthymic mood, full affect   EKG:  EKG is ordered today. The ekg ordered today demonstrates normal sinus rhythm at 81 bpm.  Occasional PVCs.  Incomplete right bundle branch block.  Nonspecific ST-T wave changes.   Recent Labs: 06/06/2015: Hemoglobin 14.3; Platelets 253; TSH  3.44 07/01/2015: ALT 12; BUN 18; Creat 0.87; Potassium 4.1; Sodium 138    Lipid Panel    Component Value Date/Time   CHOL 147 07/01/2015 0804   TRIG 72 07/01/2015 0804   HDL 42 07/01/2015 0804   CHOLHDL 3.5 07/01/2015 0804   VLDL 14 07/01/2015 0804   LDLCALC 91 07/01/2015 0804   Wt Readings from Last 3 Encounters:  07/03/15 182 lb (82.555 kg)  06/06/15 180 lb 6.4 oz (81.829 kg)  01/11/15 181 lb 6.4 oz (82.283 kg)    EKG performed today 07/03/2015 shows normal sinus rhythm rightward axis possible prior anterior MI age indeterminate there is no change from prior EKG on 01/11/2015.   ASSESSMENT AND PLAN:  1. Mitral valve prolapse 2. Hypercholesterolemia 3. incisional abdominal hernia, recovering from surgery done on 10/13/13 by Dr. Johney Maine 4. history of hemachromatosis followed by Dr.Ennever.  He has not had to have any recent phlebotomies. 5. history of post-ablative hypothyroidism, on Synthroid, followed by Dr. Dwyane Dee 6. Frequent PVCs 7. Essential hypertension not adequately controlled  Current medicines are reviewed at length with the patient today.  The patient does not have concerns regarding medicines.  Recheck blood pressure 137/78, heart rate normal. The patient is generally active and asymptomatic. We will continue the same regimen. I have reviewed labs with patient and he has normal electrolytes normal kidney and liver function. His lipids are within normal range.   No orders of the defined types were placed in this encounter.    follow-up in 6 months.   Romeo Rabon   07/03/2015 8:28 AM    [facility] [place], [place], [place]  [address] Phone: [phone]; Fax: [phone]

## 2015-07-03 NOTE — Patient Instructions (Signed)
Your physician recommends that you continue on your current medications as directed. Please refer to the Current Medication list given to you today.     Your physician wants you to follow-up in: 6 MONTHS WITH DR NELSON You will receive a reminder letter in the mail two months in advance. If you don't receive a letter, please call our office to schedule the follow-up appointment.  

## 2015-08-09 DIAGNOSIS — H40013 Open angle with borderline findings, low risk, bilateral: Secondary | ICD-10-CM | POA: Diagnosis not present

## 2015-08-12 DIAGNOSIS — H6121 Impacted cerumen, right ear: Secondary | ICD-10-CM | POA: Diagnosis not present

## 2015-08-12 DIAGNOSIS — H6061 Unspecified chronic otitis externa, right ear: Secondary | ICD-10-CM | POA: Diagnosis not present

## 2015-08-14 DIAGNOSIS — H6063 Unspecified chronic otitis externa, bilateral: Secondary | ICD-10-CM | POA: Diagnosis not present

## 2015-08-14 DIAGNOSIS — H6122 Impacted cerumen, left ear: Secondary | ICD-10-CM | POA: Diagnosis not present

## 2015-08-23 DIAGNOSIS — Z83511 Family history of glaucoma: Secondary | ICD-10-CM | POA: Diagnosis not present

## 2015-08-23 DIAGNOSIS — H40013 Open angle with borderline findings, low risk, bilateral: Secondary | ICD-10-CM | POA: Diagnosis not present

## 2015-08-23 DIAGNOSIS — H40053 Ocular hypertension, bilateral: Secondary | ICD-10-CM | POA: Diagnosis not present

## 2015-10-02 DIAGNOSIS — R972 Elevated prostate specific antigen [PSA]: Secondary | ICD-10-CM | POA: Diagnosis not present

## 2015-10-02 DIAGNOSIS — N402 Nodular prostate without lower urinary tract symptoms: Secondary | ICD-10-CM | POA: Diagnosis not present

## 2015-10-09 ENCOUNTER — Encounter: Payer: Self-pay | Admitting: Internal Medicine

## 2015-10-09 ENCOUNTER — Ambulatory Visit (INDEPENDENT_AMBULATORY_CARE_PROVIDER_SITE_OTHER): Payer: PPO | Admitting: Internal Medicine

## 2015-10-09 VITALS — BP 148/80 | HR 76 | Temp 97.8°F | Resp 16 | Ht 71.0 in | Wt 184.0 lb

## 2015-10-09 DIAGNOSIS — J449 Chronic obstructive pulmonary disease, unspecified: Secondary | ICD-10-CM | POA: Diagnosis not present

## 2015-10-09 DIAGNOSIS — R972 Elevated prostate specific antigen [PSA]: Secondary | ICD-10-CM | POA: Insufficient documentation

## 2015-10-09 DIAGNOSIS — E038 Other specified hypothyroidism: Secondary | ICD-10-CM | POA: Diagnosis not present

## 2015-10-09 DIAGNOSIS — I10 Essential (primary) hypertension: Secondary | ICD-10-CM

## 2015-10-09 DIAGNOSIS — K5732 Diverticulitis of large intestine without perforation or abscess without bleeding: Secondary | ICD-10-CM | POA: Insufficient documentation

## 2015-10-09 NOTE — Progress Notes (Signed)
Subjective:    Patient ID: Preston Reeves, male    DOB: 07-Sep-1940, 75 y.o.   MRN: [id number]  HPI He is here to establish with a new pcp.     Hypertension: He is taking his medication daily. He is compliant with a low sodium diet.  He denies chest pain, palpitations, edema, shortness of breath and regular headaches. He is exercising regularly - golf twice a week, yard work.  He does not monitor his blood pressure at home.    Hypothyroidism:  He is taking his medication daily.  He is following with endocrine. He denies any recent changes in energy or weight that are unexplained.   Hemochromatosis:  He currently follows with hematology. As hemochromatosis has been stable.  COPD: He does not use any inhalers. He denies any daily cough, wheeze or shortness of breath. He does experience shortness of breath with strenuous exertion only. He is not currently exercising regularly, beyond golf and yardwork.    Medications and allergies reviewed with patient and updated if appropriate.  Patient Active Problem List   Diagnosis Date Noted  . Diverticulitis of colon 10/09/2015  . PVC's (premature ventricular contractions) 07/25/2014  . Essential hypertension 07/25/2014  . Ventral hernia 10/16/2013  . Incisional hernias s/p lap/open VWH repair w mesh 06/20/2013  . Hypothyroidism 12/28/2012  . MVP (mitral valve prolapse)   . Diverticulosis   . History of BPH   . Hemochromatosis   . COPD (chronic obstructive pulmonary disease) ([place])     Current Outpatient Prescriptions on File Prior to Visit  Medication Sig Dispense Refill  . Ascorbic Acid (VITAMIN C) 1000 MG tablet Take 1,000 mg by mouth daily.      Marland Kitchen aspirin 81 MG tablet Take 81 mg by mouth 2 (two) times daily.     . Biotin 10 MG CAPS Take 10 mg by mouth daily.     . Calcium Carbonate-Vitamin D (CALCIUM + D PO) Take 1 tablet by mouth daily.     . Cholecalciferol (VITAMIN D) 2000 UNITS CAPS Take 2,000 Units by mouth daily.     .  fexofenadine-pseudoephedrine (ALLEGRA-D 24) 180-240 MG per 24 hr tablet Take 1 tablet by mouth daily.    Marland Kitchen levothyroxine (SYNTHROID, LEVOTHROID) 150 MCG tablet TAKE 1 TABLET(150 MCG) BY MOUTH DAILY BEFORE BREAKFAST 90 tablet 2  . loratadine (CLARITIN) 10 MG tablet Take 10 mg by mouth daily as needed for allergies. As needed    . metoprolol succinate (TOPROL-XL) 25 MG 24 hr tablet Take 1 tablet (25 mg total) by mouth daily. 90 tablet 3  . Multiple Vitamins-Minerals (MULTIVITAMIN PO) Take 1 tablet by mouth daily.     . Omega-3 Fatty Acids (FISH OIL PO) Take 900 mg by mouth daily. Taking 900 daily    . Red Yeast Rice 600 MG CAPS Take 1,200 mg by mouth daily.     Marland Kitchen SALINE NASAL SPRAY NA Place 1 spray into the nose daily as needed (allergies).     . vitamin B-12 (CYANOCOBALAMIN) 1000 MCG tablet Take 1,000 mcg by mouth daily.     No current facility-administered medications on file prior to visit.     Past Medical History:  Diagnosis Date  . Allergy    SEASONAL  . Cataract   . Colon polyps    Adenomatous  . Diverticulitis of colon with perforation 2010   Colectomy/ostomy  . Diverticulosis    hx of  . Hemochromatosis    hx of  .  History of BPH   . History of COPD   . Hyperlipidemia   . Hypothyroidism   . Incisional hernia   . MVP (mitral valve prolapse)   . Thyroid disease     Past Surgical History:  Procedure Laterality Date  . COLONOSCOPY    . COLONOSCOPY W/ BIOPSIES  2011   Dr Henrene Pastor  . COLOSTOMY TAKEDOWN  2010   Dr Barry Dienes  . INGUINAL HERNIA REPAIR Left 1999   Dr. Rebekah Chesterfield  . INSERTION OF MESH N/A 10/13/2013   Procedure: INSERTION OF MESH;  Surgeon: Michael Boston, MD;  Location: [facility];  Service: General;  Laterality: N/A;  . LAPAROSCOPIC LYSIS OF ADHESIONS N/A 10/13/2013   Procedure: LAPAROSCOPIC LYSIS OF ADHESIONS;  Surgeon: Michael Boston, MD;  Location: [facility];  Service: General;  Laterality: N/A;  . PARTIAL COLECTOMY  08/12/2008   [facility]mann; perforated colon from diverticulitis    . UPPER GI ENDOSCOPY    . VENTRAL HERNIA REPAIR N/A 10/13/2013   Procedure: REPAIR OF VENTRAL WALL HERNIA WITH UNILATERAL COMPARTMENT SEPARATION;  Surgeon: Michael Boston, MD;  Location: [facility];  Service: General;  Laterality: N/A;    Social History   Social History  . Marital status: Married    Spouse name: N/A  . Number of children: N/A  . Years of education: N/A   Occupational History  . Retired Retired   Social History Main Topics  . Smoking status: Former Smoker    Types: Cigarettes  . Smokeless tobacco: Never Used  . Alcohol use 0.0 oz/week     Comment: drinks beer weekly  . Drug use: No  . Sexual activity: Yes   Other Topics Concern  . None   Social History Narrative   Married. Education: [employer].    Family History  Problem Relation Age of Onset  . Breast cancer Mother   . Colon cancer Father   . Heart disease Father   . Diabetes Father   . Heart attack Father   . Stroke Neg Hx     Review of Systems  Constitutional: Negative for chills and fever.  Eyes: Negative for visual disturbance.  Respiratory: Positive for shortness of breath (strenous exercise only). Negative for cough and wheezing.   Cardiovascular: Negative for chest pain, palpitations and leg swelling.  Gastrointestinal: Negative for abdominal pain, blood in stool, constipation, diarrhea and nausea.       No gerd  Genitourinary: Negative for dysuria and hematuria.  Musculoskeletal: Positive for arthralgias (mild arthiritis). Negative for back pain.  Neurological: Negative for dizziness, light-headedness and headaches.  Psychiatric/Behavioral: Negative for dysphoric mood. The patient is not nervous/anxious.        Objective:   Vitals:   10/09/15 0913  BP: (!) 148/80  Pulse: 76  Resp: 16  Temp: 97.8 F (36.6 C)   Filed Weights   10/09/15 0913  Weight: 184 lb (83.5 kg)   Body mass index is 25.66 kg/m.   Physical Exam Constitutional: He appears well-developed and well-nourished. No  distress.  HENT:  Head: Normocephalic and atraumatic.  Right Ear: External ear normal.  Left Ear: External ear normal.  Mouth/Throat: Oropharynx is clear and moist.  Normal ear canals and TM b/l  Eyes: Conjunctivae normal.  Neck: Neck supple. No tracheal deviation present. No thyromegaly present.  No carotid bruit  Cardiovascular: Normal rate, regular rhythm, normal heart sounds and intact distal pulses.   No murmur heard. Pulmonary/Chest: Effort normal and breath sounds normal. No respiratory distress. He has no wheezes.  He has no rales.  Abdominal: Soft. Bowel sounds are normal. He exhibits no distension. There is no tenderness.  Musculoskeletal: He exhibits no edema.  Lymphadenopathy:   He has no cervical adenopathy.  Skin: Skin is warm and dry. He is not diaphoretic.  Psychiatric: He has a normal mood and affect. His behavior is normal.         Assessment & Plan:   See Problem List for Assessment and Plan of chronic medical problems.  Follow-up annually

## 2015-10-09 NOTE — Assessment & Plan Note (Signed)
Following with hematology.

## 2015-10-09 NOTE — Assessment & Plan Note (Signed)
Management per endocrine 

## 2015-10-09 NOTE — Assessment & Plan Note (Signed)
BP Readings from Last 3 Encounters:  10/09/15 (!) 148/80  07/03/15 140/82  06/06/15 140/80   BP goal less than 150/90 BP well controlled Current regimen effective and well tolerated Continue current medications at current doses

## 2015-10-09 NOTE — Assessment & Plan Note (Signed)
Following with neurology 

## 2015-10-09 NOTE — Patient Instructions (Addendum)
It was nice to meet you today.   No immunizations administered today.   Medications reviewed and updated.  No changes recommended at this time.   Please followup in one year for a physical, sooner if needed

## 2015-10-09 NOTE — Assessment & Plan Note (Signed)
No daily shortness of breath. He only experiences shortness of breath with strenuous exertion Denies cough and wheeze Currently on no treatment

## 2015-10-09 NOTE — Progress Notes (Signed)
Pre visit review using our clinic review tool, if applicable. No additional management support is needed unless otherwise documented below in the visit note. 

## 2015-12-16 ENCOUNTER — Other Ambulatory Visit: Payer: Self-pay | Admitting: Cardiology

## 2015-12-16 MED ORDER — METOPROLOL SUCCINATE ER 25 MG PO TB24: 25.0000 mg | ORAL_TABLET | Freq: Every day | ORAL | 1 refills | Status: DC

## 2015-12-20 ENCOUNTER — Other Ambulatory Visit: Payer: Commercial Managed Care - HMO

## 2015-12-23 ENCOUNTER — Other Ambulatory Visit (INDEPENDENT_AMBULATORY_CARE_PROVIDER_SITE_OTHER): Payer: PPO

## 2015-12-23 DIAGNOSIS — E890 Postprocedural hypothyroidism: Secondary | ICD-10-CM

## 2015-12-23 LAB — TSH: TSH: 3.58 u[IU]/mL (ref 0.35–4.50)

## 2015-12-23 LAB — T4, FREE: FREE T4: 1.2 ng/dL (ref 0.60–1.60)

## 2015-12-25 ENCOUNTER — Ambulatory Visit (INDEPENDENT_AMBULATORY_CARE_PROVIDER_SITE_OTHER): Payer: PPO | Admitting: Endocrinology

## 2015-12-25 ENCOUNTER — Ambulatory Visit: Payer: Commercial Managed Care - HMO | Admitting: Endocrinology

## 2015-12-25 ENCOUNTER — Encounter: Payer: Self-pay | Admitting: Endocrinology

## 2015-12-25 VITALS — BP 136/82 | HR 71 | Ht 71.0 in | Wt 184.0 lb

## 2015-12-25 DIAGNOSIS — E890 Postprocedural hypothyroidism: Secondary | ICD-10-CM | POA: Diagnosis not present

## 2015-12-25 NOTE — Patient Instructions (Signed)
Stop Biotin

## 2015-12-25 NOTE — Progress Notes (Signed)
Reason for Appointment:  Hypothyroidism, followup visit    History of Present Illness:   The hypothyroidism was first diagnosed in 2007 after radioactive iodine treatment for Graves' disease   The patient has been treated with  Levoxyl, generic levothyroxine and brand name Synthroid  His last dose change was in 11/15 and at that time his TSH was slightly increased at 4.7 and his levothyroxine dose was increased to 150 g He thinks he may have felt a little less fatigue with this change   He says he is still getting  brand name Synthroid from [facility] and does not have any problem with the cost Recently has no complaints of unusual fatigue  He is active playing golf Weight is stable       The patient is taking the thyroid supplement very regularly in the morning before breakfast.  He is taking calcium at night and also multivitamin separately He takes biotin for his nails as recommended by his wife  TSH is consistently  normal  Lab Results  Component Value Date   TSH 3.58 12/23/2015   TSH 3.44 06/06/2015   TSH 2.33 12/17/2014   FREET4 1.20 12/23/2015   FREET4 1.34 12/17/2014   FREET4 1.22 04/30/2014    Wt Readings from Last 3 Encounters:  12/25/15 184 lb (83.5 kg)  10/09/15 184 lb (83.5 kg)  07/03/15 182 lb (82.6 kg)      Medication List       Accurate as of 12/25/15  8:16 AM. Always use your most recent med list.          aspirin 81 MG tablet Take 81 mg by mouth 2 (two) times daily.   Biotin 10 MG Caps Take 10 mg by mouth daily.   CALCIUM + D PO Take 1 tablet by mouth daily.   COLACE PO Take by mouth.   fexofenadine-pseudoephedrine 180-240 MG 24 hr tablet Commonly known as:  ALLEGRA-D 24 Take 1 tablet by mouth daily.   FISH OIL PO Take 900 mg by mouth daily. Taking 900 daily   levothyroxine 150 MCG tablet Commonly known as:  SYNTHROID, LEVOTHROID TAKE 1 TABLET(150 MCG) BY MOUTH DAILY BEFORE BREAKFAST   loratadine 10 MG  tablet Commonly known as:  CLARITIN Take 10 mg by mouth daily as needed for allergies. As needed   metoprolol succinate 25 MG 24 hr tablet Commonly known as:  TOPROL-XL Take 1 tablet (25 mg total) by mouth daily.   MULTIVITAMIN PO Take 1 tablet by mouth daily.   Red Yeast Rice 600 MG Caps Take 1,200 mg by mouth daily.   SALINE NASAL SPRAY NA Place 1 spray into the nose daily as needed (allergies).   vitamin B-12 1000 MCG tablet Commonly known as:  CYANOCOBALAMIN Take 1,000 mcg by mouth daily.   vitamin C 1000 MG tablet Take 1,000 mg by mouth daily.   Vitamin D 2000 units Caps Take 2,000 Units by mouth daily.       Allergies: No Known Allergies  Past Medical History:  Diagnosis Date  . Allergy    SEASONAL  . Cataract   . Colon polyps    Adenomatous  . Diverticulitis of colon with perforation 2010   Colectomy/ostomy  . Diverticulosis    hx of  . Hemochromatosis    hx of  . History of BPH   . History of COPD   . Hyperlipidemia   . Hypothyroidism   . Incisional hernia   . MVP (mitral valve prolapse)   .  Thyroid disease     Past Surgical History:  Procedure Laterality Date  . COLONOSCOPY    . COLONOSCOPY W/ BIOPSIES  2011   Dr Henrene Pastor  . COLOSTOMY TAKEDOWN  2010   Dr Barry Dienes  . INGUINAL HERNIA REPAIR Left 1999   Dr. Rebekah Chesterfield  . INSERTION OF MESH N/A 10/13/2013   Procedure: INSERTION OF MESH;  Surgeon: Michael Boston, MD;  Location: [facility];  Service: General;  Laterality: N/A;  . LAPAROSCOPIC LYSIS OF ADHESIONS N/A 10/13/2013   Procedure: LAPAROSCOPIC LYSIS OF ADHESIONS;  Surgeon: Michael Boston, MD;  Location: [facility];  Service: General;  Laterality: N/A;  . PARTIAL COLECTOMY  08/12/2008   [facility]; perforated colon from diverticulitis  . UPPER GI ENDOSCOPY    . VENTRAL HERNIA REPAIR N/A 10/13/2013   Procedure: REPAIR OF VENTRAL WALL HERNIA WITH UNILATERAL COMPARTMENT SEPARATION;  Surgeon: Michael Boston, MD;  Location: [facility];  Service: General;  Laterality: N/A;     Family History  Problem Relation Age of Onset  . Breast cancer Mother   . Colon cancer Father   . Heart disease Father   . Diabetes Father   . Heart attack Father   . Stroke Neg Hx     Social History:  reports that he has quit smoking. His smoking use included Cigarettes. He has never used smokeless tobacco. He reports that he drinks alcohol. He reports that he does not use drugs.   Review of Systems      Examination:   BP 136/82   Pulse 71   Ht 5\' 11"  (1.803 m)   Wt 184 lb (83.5 kg)   SpO2 97%   BMI 25.66 kg/m   Looks well Neck exam shows no abnormality in the thyroid area or lymphadenopathy Biceps reflexes appear normal Heart rate regular    Assessment   Hypothyroidism, post ablative treated with brand name Synthroid 150 g Clinically looks euthyroid and his TSH is  consistently normal after the change in dose in 11/15 He is compliant with his medication daily and does not take any interactive substances with it   Treatment:  The dose will be continued unchanged, he will continue to get his brand name Synthroid from [place] He will call if he has any unusual fatigue Recommended stopping biotin as it interferes with free T4 level Follow up annually  [facility] 12/25/2015, 8:16 AM

## 2015-12-30 ENCOUNTER — Ambulatory Visit (HOSPITAL_BASED_OUTPATIENT_CLINIC_OR_DEPARTMENT_OTHER): Payer: PPO | Admitting: Hematology & Oncology

## 2015-12-30 ENCOUNTER — Encounter: Payer: Self-pay | Admitting: Hematology & Oncology

## 2015-12-30 ENCOUNTER — Other Ambulatory Visit (HOSPITAL_BASED_OUTPATIENT_CLINIC_OR_DEPARTMENT_OTHER): Payer: PPO

## 2015-12-30 LAB — COMPREHENSIVE METABOLIC PANEL
ALT: 15 U/L (ref 0–55)
ANION GAP: 8 meq/L (ref 3–11)
AST: 23 U/L (ref 5–34)
Albumin: 3.7 g/dL (ref 3.5–5.0)
Alkaline Phosphatase: 69 U/L (ref 40–150)
BUN: 17.4 mg/dL (ref 7.0–26.0)
CHLORIDE: 106 meq/L (ref 98–109)
CO2: 24 meq/L (ref 22–29)
CREATININE: 0.9 mg/dL (ref 0.7–1.3)
Calcium: 9.6 mg/dL (ref 8.4–10.4)
EGFR: 79 mL/min/{1.73_m2} — ABNORMAL LOW (ref >90)
GLUCOSE: 128 mg/dL (ref 70–140)
Potassium: 4.1 mEq/L (ref 3.5–5.1)
SODIUM: 138 meq/L (ref 136–145)
Total Bilirubin: 0.75 mg/dL (ref 0.20–1.20)
Total Protein: 7.9 g/dL (ref 6.4–8.3)

## 2015-12-30 LAB — CBC WITH DIFFERENTIAL (CANCER CENTER ONLY)
BASO#: 0 10*3/uL (ref 0.0–0.2)
BASO%: 0.6 % (ref 0.0–2.0)
EOS%: 3.1 % (ref 0.0–7.0)
Eosinophils Absolute: 0.2 10*3/uL (ref 0.0–0.5)
HCT: 42.5 % (ref 38.7–49.9)
HGB: 14.9 g/dL (ref 13.0–17.1)
LYMPH#: 1.1 10*3/uL (ref 0.9–3.3)
LYMPH%: 22.2 % (ref 14.0–48.0)
MCH: 31.8 pg (ref 28.0–33.4)
MCHC: 35.1 g/dL (ref 32.0–35.9)
MCV: 91 fL (ref 82–98)
MONO#: 0.6 10*3/uL (ref 0.1–0.9)
MONO%: 12.1 % (ref 0.0–13.0)
NEUT#: 3 10*3/uL (ref 1.5–6.5)
NEUT%: 62 % (ref 40.0–80.0)
PLATELETS: 209 10*3/uL (ref 145–400)
RBC: 4.68 10*6/uL (ref 4.20–5.70)
RDW: 12.9 % (ref 11.1–15.7)
WBC: 4.9 10*3/uL (ref 4.0–10.0)

## 2015-12-30 NOTE — Progress Notes (Signed)
Hematology and Oncology Follow Up Visit  DEVON COZZA [id number] 10-13-1940 75 y.o. 12/30/2015   Principle Diagnosis:   Hemochromatosis  Current Therapy:    Phlebotomy to maintain ferritin less than 100     Interim History:  Mr. Portillo is back for follow-up. We see him yearly. He's doing quite well. He is not been phlebotomized for many years. He's has that his wife will not let him go to the [employer] anymore for donations I am not sure why she is reluctant to have him go.   We will last saw him a year ago, his ferritin was 19 with an iron saturation of 34%.  He's had no problems with nausea or vomiting. He's had no joint issues. He's had no change in bowel or bladder habits. He's had a cough. Is under shortness of breath. There's been no rashes. He's had no leg swelling.  Had a very nice Thanksgiving. He is looking for it to Christmas.  He and his wife now have 2 great grandchildren.  He had a nice summer. As always, they went to the beach down in [place].  Overall, his performance status is ECOG 0.  Medications:  Current Outpatient Prescriptions:  .  Ascorbic Acid (VITAMIN C) 1000 MG tablet, Take 1,000 mg by mouth daily.  , Disp: , Rfl:  .  aspirin 81 MG tablet, Take 81 mg by mouth 2 (two) times daily. , Disp: , Rfl:  .  Calcium Carbonate-Vitamin D (CALCIUM + D PO), Take 1 tablet by mouth daily. , Disp: , Rfl:  .  Cholecalciferol (VITAMIN D) 2000 UNITS CAPS, Take 2,000 Units by mouth daily. , Disp: , Rfl:  .  Docusate Sodium (COLACE PO), Take by mouth., Disp: , Rfl:  .  fexofenadine-pseudoephedrine (ALLEGRA-D 24) 180-240 MG per 24 hr tablet, Take 1 tablet by mouth daily., Disp: , Rfl:  .  levothyroxine (SYNTHROID, LEVOTHROID) 150 MCG tablet, TAKE 1 TABLET(150 MCG) BY MOUTH DAILY BEFORE BREAKFAST, Disp: 90 tablet, Rfl: 2 .  loratadine (CLARITIN) 10 MG tablet, Take 10 mg by mouth daily as needed for allergies. As needed, Disp: , Rfl:  .  metoprolol succinate  (TOPROL-XL) 25 MG 24 hr tablet, Take 1 tablet (25 mg total) by mouth daily., Disp: 90 tablet, Rfl: 1 .  Multiple Vitamins-Minerals (MULTIVITAMIN PO), Take 1 tablet by mouth daily. , Disp: , Rfl:  .  Omega-3 Fatty Acids (FISH OIL PO), Take 900 mg by mouth daily. Taking 900 daily, Disp: , Rfl:  .  Red Yeast Rice 600 MG CAPS, Take 1,200 mg by mouth daily. , Disp: , Rfl:  .  SALINE NASAL SPRAY NA, Place 1 spray into the nose daily as needed (allergies). , Disp: , Rfl:  .  vitamin B-12 (CYANOCOBALAMIN) 1000 MCG tablet, Take 1,000 mcg by mouth daily., Disp: , Rfl:   Allergies: No Known Allergies  Past Medical History, Surgical history, Social history, and Family History were reviewed and updated.  Review of Systems: As above  Physical Exam:  weight is 185 lb 1.9 oz (84 kg). His oral temperature is 97.6 F (36.4 C). His blood pressure is 150/72 (abnormal) and his pulse is 65.   Wt Readings from Last 3 Encounters:  12/30/15 185 lb 1.9 oz (84 kg)  12/25/15 184 lb (83.5 kg)  10/09/15 184 lb (83.5 kg)     Well-developed and well-nourished white gentleman in no obvious distress. Head and neck exam shows no ocular or oral lesions. There are no  palpable cervical or supraclavicular lymph nodes. Lungs are clear. Cardiac exam regular rate and rhythm with no murmurs, rubs or bruits. Abdomen is soft. Has good bowel sound. There is no fluid wave. There is no palpable liver or spleen tip. Back exam shows no tenderness over the spine, ribs or hips. Extremities shows no clubbing, cyanosis or edema. Skin exam shows no rashes, ecchymoses or petechia.  Lab Results  Component Value Date   WBC 4.9 12/30/2015   HGB 14.9 12/30/2015   HCT 42.5 12/30/2015   MCV 91 12/30/2015   PLT 209 12/30/2015     Chemistry      Component Value Date/Time   NA 138 07/01/2015 0804   K 4.1 07/01/2015 0804   CL 102 07/01/2015 0804   CO2 27 07/01/2015 0804   BUN 18 07/01/2015 0804   CREATININE 0.87 07/01/2015 0804        Component Value Date/Time   CALCIUM 9.2 07/01/2015 0804   ALKPHOS 62 07/01/2015 0804   AST 21 07/01/2015 0804   ALT 12 07/01/2015 0804   BILITOT 0.5 07/01/2015 0804         Impression and Plan: Mr. Kmetz is a 75 year old gentleman with hemochromatosis. I will see what his iron studies are. Again, I would think that he would not need to be phlebotomized.  Now that his wife will not let him go to the [employer], we may have to watch him and see what iron levels are.  We'll plan to get him back in 1 more year.   [name], MD 12/4/20172:10 PM

## 2015-12-31 LAB — IRON AND TIBC
%SAT: 69 % — AB (ref 20–55)
Iron: 159 ug/dL (ref 42–163)
TIBC: 231 ug/dL (ref 202–409)
UIBC: 73 ug/dL — ABNORMAL LOW (ref 117–376)

## 2015-12-31 LAB — AFP TUMOR MARKER: AFP, SERUM, TUMOR MARKER: 2.3 ng/mL (ref 0.0–8.3)

## 2015-12-31 LAB — FERRITIN: Ferritin: 23 ng/ml (ref 22–316)

## 2016-01-02 ENCOUNTER — Telehealth: Payer: Self-pay | Admitting: *Deleted

## 2016-01-02 NOTE — Telephone Encounter (Signed)
Spoke with patient his iron is way too high and needs phlebotomy. Patient states he will do this at the [employer].  Dr. Marin Olp notified.

## 2016-01-02 NOTE — Telephone Encounter (Signed)
-----   Message from [name], MD sent at 12/31/2015  1:32 PM EST ----- Call - the iron level is up a little too much the iron saturation is >50% which is too high.  Need to come in for 1 phlebotomy!!!  Preston Reeves

## 2016-01-03 LAB — HEMOCHROMATOSIS DNA-PCR(C282Y,H63D)

## 2016-01-06 DIAGNOSIS — H6122 Impacted cerumen, left ear: Secondary | ICD-10-CM | POA: Diagnosis not present

## 2016-01-08 ENCOUNTER — Ambulatory Visit (INDEPENDENT_AMBULATORY_CARE_PROVIDER_SITE_OTHER): Payer: PPO | Admitting: Cardiology

## 2016-01-08 ENCOUNTER — Encounter: Payer: Self-pay | Admitting: Cardiology

## 2016-01-08 VITALS — BP 136/70 | HR 75 | Ht 71.0 in | Wt 186.0 lb

## 2016-01-08 DIAGNOSIS — I10 Essential (primary) hypertension: Secondary | ICD-10-CM

## 2016-01-08 DIAGNOSIS — R0609 Other forms of dyspnea: Secondary | ICD-10-CM

## 2016-01-08 DIAGNOSIS — I341 Nonrheumatic mitral (valve) prolapse: Secondary | ICD-10-CM | POA: Diagnosis not present

## 2016-01-08 DIAGNOSIS — Z789 Other specified health status: Secondary | ICD-10-CM

## 2016-01-08 DIAGNOSIS — E7800 Pure hypercholesterolemia, unspecified: Secondary | ICD-10-CM

## 2016-01-08 DIAGNOSIS — R0600 Dyspnea, unspecified: Secondary | ICD-10-CM

## 2016-01-08 NOTE — Patient Instructions (Signed)
Medication Instructions:   Your physician recommends that you continue on your current medications as directed. Please refer to the Current Medication list given to you today.    Labwork:  PRIOR TO YOUR 6 MONTH FOLLOW-UP APPOINTMENT WITH DR NELSON TO CHECK---BMET, CBC W DIFF, LIPIDS---PLEASE COME FASTING TO THIS LAB APPOINTMENT    Testing/Procedures:  Your physician has requested that you have an echocardiogram. Echocardiography is a painless test that uses sound waves to create images of your heart. It provides your doctor with information about the size and shape of your heart and how well your heart's chambers and valves are working. This procedure takes approximately one hour. There are no restrictions for this procedure.    Follow-Up:  Your physician wants you to follow-up in: [place] will receive a reminder letter in the mail two months in advance. If you don't receive a letter, please call our office to schedule the follow-up appointment.  PLEASE HAVE YOUR LAB APPOINTMENT SCHEDULED AND DONE PRIOR TO THIS OFFICE VISIT       If you need a refill on your cardiac medications before your next appointment, please call your pharmacy.

## 2016-01-08 NOTE — Progress Notes (Signed)
Cardiology Office Note  Date:  01/08/2016   ID:  Preston Reeves, DOB 09-04-1940, MRN [id number]  PCP:  Binnie Rail, MD  Cardiologist: Darlin Coco MD  Chief complain: 6 months follow up, transition from Dr Mare Ferrari   History of Present Illness: Preston Reeves is a 75 y.o. male who presents for a six-month follow-up visit. The patient is being followed for mitral valve prolapse, hypertension hyperlipidemia. He also has a history of thyrotoxicosis, followed by Dr. Dwyane Dee. He has a history of hemochromatosis. He has a history of mitral valve prolapse. Since last visit he has had a lot of gastrointestinal problems. In 2005 he had surgery for a ruptured diverticular abscess. At that time he had a temporary colostomy which was subsequently closed. Over the past 10 years he had developed multiple abdominal hernias. He went to surgery on 10/13/13 by Dr. Johney Maine. Laparoscopic surgery was not possible and he had an open procedure.  The patient has a history of mitral valve prolapse.   01/08/2016 - this six-month follow-up, in the meantime he has noticed no major changes, he denies any chest pain, he continues to play golf but can still do 18 holes. He denies any shortness of breath other than at high level of activity. He also denies palpitation or syncope.   Past Medical History:  Diagnosis Date  . Allergy    SEASONAL  . Cataract   . Colon polyps    Adenomatous  . Diverticulitis of colon with perforation 2010   Colectomy/ostomy  . Diverticulosis    hx of  . Hemochromatosis    hx of  . History of BPH   . History of COPD   . Hyperlipidemia   . Hypothyroidism   . Incisional hernia   . MVP (mitral valve prolapse)   . Thyroid disease     Past Surgical History:  Procedure Laterality Date  . COLONOSCOPY    . COLONOSCOPY W/ BIOPSIES  2011   Dr Henrene Pastor  . COLOSTOMY TAKEDOWN  2010   Dr Barry Dienes  . INGUINAL HERNIA REPAIR Left 1999   Dr. Rebekah Chesterfield  . INSERTION OF MESH N/A  10/13/2013   Procedure: INSERTION OF MESH;  Surgeon: Michael Boston, MD;  Location: [facility];  Service: General;  Laterality: N/A;  . LAPAROSCOPIC LYSIS OF ADHESIONS N/A 10/13/2013   Procedure: LAPAROSCOPIC LYSIS OF ADHESIONS;  Surgeon: Michael Boston, MD;  Location: [facility];  Service: General;  Laterality: N/A;  . PARTIAL COLECTOMY  08/12/2008   [facility]; perforated colon from diverticulitis  . UPPER GI ENDOSCOPY    . VENTRAL HERNIA REPAIR N/A 10/13/2013   Procedure: REPAIR OF VENTRAL WALL HERNIA WITH UNILATERAL COMPARTMENT SEPARATION;  Surgeon: Michael Boston, MD;  Location: [facility];  Service: General;  Laterality: N/A;     Current Outpatient Prescriptions  Medication Sig Dispense Refill  . Ascorbic Acid (VITAMIN C) 1000 MG tablet Take 1,000 mg by mouth daily.      Preston Reeves Kitchen aspirin 81 MG tablet Take 81 mg by mouth 2 (two) times daily.     . Calcium Carbonate-Vitamin D (CALCIUM + D PO) Take 1 tablet by mouth daily.     . Cholecalciferol (VITAMIN D) 2000 UNITS CAPS Take 2,000 Units by mouth daily.     Mariane Baumgarten Sodium (COLACE PO) Take by mouth.    . fexofenadine-pseudoephedrine (ALLEGRA-D 24) 180-240 MG per 24 hr tablet Take 1 tablet by mouth daily.    Preston Reeves Kitchen levothyroxine (SYNTHROID, LEVOTHROID) 150 MCG tablet TAKE 1  TABLET(150 MCG) BY MOUTH DAILY BEFORE BREAKFAST 90 tablet 2  . loratadine (CLARITIN) 10 MG tablet Take 10 mg by mouth daily as needed for allergies. As needed    . metoprolol succinate (TOPROL-XL) 25 MG 24 hr tablet Take 1 tablet (25 mg total) by mouth daily. 90 tablet 1  . Multiple Vitamins-Minerals (MULTIVITAMIN PO) Take 1 tablet by mouth daily.     . Omega-3 Fatty Acids (FISH OIL PO) Take 900 mg by mouth daily. Taking 900 daily    . Red Yeast Rice 600 MG CAPS Take 1,200 mg by mouth daily.     Preston Reeves Kitchen SALINE NASAL SPRAY NA Place 1 spray into the nose daily as needed (allergies).     . vitamin B-12 (CYANOCOBALAMIN) 1000 MCG tablet Take 1,000 mcg by mouth daily.     No current facility-administered  medications for this visit.     Allergies:   Patient has no known allergies.    Social History:  The patient  reports that he has quit smoking. His smoking use included Cigarettes. He has never used smokeless tobacco. He reports that he drinks alcohol. He reports that he does not use drugs.   Family History:  The patient's family history includes Breast cancer in his mother; Colon cancer in his father; Diabetes in his father; Heart attack in his father; Heart disease in his father.    ROS:  Please see the history of present illness.   Otherwise, review of systems are positive for none.   All other systems are reviewed and negative.    PHYSICAL EXAM: VS:  BP 136/70   Pulse 75   Ht 5\' 11"  (1.803 m)   Wt 186 lb (84.4 kg)   BMI 25.94 kg/m  , BMI Body mass index is 25.94 kg/m. GEN: Well nourished, well developed, in no acute distress  HEENT: normal  Neck: no JVD, carotid bruits, or masses Cardiac: RRR; 3/6 systolic murmur. no, rubs, or gallops,no edema  Respiratory:  clear to auscultation bilaterally, normal work of breathing GI: soft, nontender, nondistended, + BS MS: no deformity or atrophy  Skin: warm and dry, no rash Neuro:  Strength and sensation are intact Psych: euthymic mood, full affect   EKG:  EKG is ordered today. The ekg ordered today demonstrates normal sinus rhythm at 81 bpm.  Occasional PVCs.  Incomplete right bundle branch block.  Nonspecific ST-T wave changes.   Recent Labs: 12/23/2015: TSH 3.58 12/30/2015: ALT 15; BUN 17.4; Creatinine 0.9; HGB 14.9; Platelets 209; Potassium 4.1; Sodium 138    Lipid Panel    Component Value Date/Time   CHOL 147 07/01/2015 0804   TRIG 72 07/01/2015 0804   HDL 42 07/01/2015 0804   CHOLHDL 3.5 07/01/2015 0804   VLDL 14 07/01/2015 0804   LDLCALC 91 07/01/2015 0804   Wt Readings from Last 3 Encounters:  01/08/16 186 lb (84.4 kg)  12/30/15 185 lb 1.9 oz (84 kg)  12/25/15 184 lb (83.5 kg)    EKG performed today 07/03/2015  shows normal sinus rhythm rightward axis possible prior anterior MI age indeterminate there is no change from prior EKG on 01/11/2015.   ASSESSMENT AND PLAN:  1. Mitral valve prolapse 2. Hypercholesterolemia 3. incisional abdominal hernia, recovering from surgery done on 10/13/13 by Dr. Johney Maine 4. history of hemachromatosis followed by Dr.Ennever.  He has not had to have any recent phlebotomies. 5. history of post-ablative hypothyroidism, on Synthroid, followed by Dr. Dwyane Dee 6. Frequent PVCs 7. Essential hypertension not adequately controlled  Current medicines are reviewed at length with the patient today.  The patient does not have concerns regarding medicines.  The patient has a murmur and history of mitral prolapse but we don't have any available echocardiogram we'll order one. All his labs were checked last week and are normal. We will check lipids and CMP prior to next appointment in 6 months. He blood pressure is well controlled.  Orders Placed This Encounter  Procedures  . Basic Metabolic Panel (BMET)  . CBC w/Diff  . Lipid Profile  . EKG 12-Lead  . ECHOCARDIOGRAM COMPLETE    follow-up in 6 months.   Romeo Rabon   01/08/2016 8:55 AM    [facility] [place], [place],   [address] Phone: [phone]; Fax: [phone]

## 2016-01-09 ENCOUNTER — Other Ambulatory Visit: Payer: Self-pay

## 2016-01-09 ENCOUNTER — Ambulatory Visit (HOSPITAL_COMMUNITY): Payer: PPO | Attending: Cardiology

## 2016-01-09 DIAGNOSIS — I10 Essential (primary) hypertension: Secondary | ICD-10-CM | POA: Diagnosis not present

## 2016-01-09 DIAGNOSIS — I341 Nonrheumatic mitral (valve) prolapse: Secondary | ICD-10-CM | POA: Diagnosis not present

## 2016-01-09 DIAGNOSIS — I350 Nonrheumatic aortic (valve) stenosis: Secondary | ICD-10-CM | POA: Diagnosis not present

## 2016-01-09 DIAGNOSIS — E7800 Pure hypercholesterolemia, unspecified: Secondary | ICD-10-CM | POA: Diagnosis not present

## 2016-01-09 DIAGNOSIS — I340 Nonrheumatic mitral (valve) insufficiency: Secondary | ICD-10-CM | POA: Insufficient documentation

## 2016-01-11 ENCOUNTER — Other Ambulatory Visit: Payer: Self-pay | Admitting: Endocrinology

## 2016-05-13 DIAGNOSIS — N403 Nodular prostate with lower urinary tract symptoms: Secondary | ICD-10-CM | POA: Diagnosis not present

## 2016-05-20 DIAGNOSIS — R351 Nocturia: Secondary | ICD-10-CM | POA: Diagnosis not present

## 2016-05-20 DIAGNOSIS — R972 Elevated prostate specific antigen [PSA]: Secondary | ICD-10-CM | POA: Diagnosis not present

## 2016-05-20 DIAGNOSIS — N403 Nodular prostate with lower urinary tract symptoms: Secondary | ICD-10-CM | POA: Diagnosis not present

## 2016-06-08 DIAGNOSIS — H6061 Unspecified chronic otitis externa, right ear: Secondary | ICD-10-CM | POA: Diagnosis not present

## 2016-06-08 DIAGNOSIS — H6122 Impacted cerumen, left ear: Secondary | ICD-10-CM | POA: Diagnosis not present

## 2016-06-26 ENCOUNTER — Other Ambulatory Visit: Payer: PPO

## 2016-07-06 ENCOUNTER — Other Ambulatory Visit: Payer: Self-pay | Admitting: *Deleted

## 2016-07-06 ENCOUNTER — Other Ambulatory Visit: Payer: Self-pay | Admitting: Cardiology

## 2016-07-06 MED ORDER — METOPROLOL SUCCINATE ER 25 MG PO TB24: 25.0000 mg | ORAL_TABLET | Freq: Every day | ORAL | 1 refills | Status: DC

## 2016-07-20 ENCOUNTER — Other Ambulatory Visit: Payer: Self-pay | Admitting: Endocrinology

## 2016-09-07 DIAGNOSIS — H5203 Hypermetropia, bilateral: Secondary | ICD-10-CM | POA: Diagnosis not present

## 2016-09-07 DIAGNOSIS — H40013 Open angle with borderline findings, low risk, bilateral: Secondary | ICD-10-CM | POA: Diagnosis not present

## 2016-09-18 ENCOUNTER — Other Ambulatory Visit: Payer: PPO

## 2016-09-18 DIAGNOSIS — E7800 Pure hypercholesterolemia, unspecified: Secondary | ICD-10-CM

## 2016-09-18 DIAGNOSIS — I10 Essential (primary) hypertension: Secondary | ICD-10-CM

## 2016-09-18 DIAGNOSIS — I341 Nonrheumatic mitral (valve) prolapse: Secondary | ICD-10-CM

## 2016-09-18 LAB — BASIC METABOLIC PANEL
BUN/Creatinine Ratio: 17 (ref 10–24)
BUN: 17 mg/dL (ref 8–27)
CO2: 25 mmol/L (ref 20–29)
Calcium: 9.6 mg/dL (ref 8.6–10.2)
Chloride: 100 mmol/L (ref 96–106)
Creatinine, Ser: 0.98 mg/dL (ref 0.76–1.27)
GFR calc Af Amer: 86 mL/min/{1.73_m2} (ref >59)
GFR calc non Af Amer: 75 mL/min/{1.73_m2} (ref >59)
Glucose: 104 mg/dL — ABNORMAL HIGH (ref 65–99)
Potassium: 4.6 mmol/L (ref 3.5–5.2)
Sodium: 139 mmol/L (ref 134–144)

## 2016-09-18 LAB — CBC WITH DIFFERENTIAL/PLATELET
Basophils Absolute: 0 10*3/uL (ref 0.0–0.2)
Basos: 0 %
EOS (ABSOLUTE): 0.2 10*3/uL (ref 0.0–0.4)
Eos: 4 %
Hematocrit: 40.6 % (ref 37.5–51.0)
Hemoglobin: 13.9 g/dL (ref 13.0–17.7)
Immature Grans (Abs): 0 10*3/uL (ref 0.0–0.1)
Immature Granulocytes: 0 %
Lymphocytes Absolute: 1 10*3/uL (ref 0.7–3.1)
Lymphs: 22 %
MCH: 31.1 pg (ref 26.6–33.0)
MCHC: 34.2 g/dL (ref 31.5–35.7)
MCV: 91 fL (ref 79–97)
Monocytes Absolute: 0.7 10*3/uL (ref 0.1–0.9)
Monocytes: 14 %
Neutrophils Absolute: 2.8 10*3/uL (ref 1.4–7.0)
Neutrophils: 60 %
Platelets: 241 10*3/uL (ref 150–379)
RBC: 4.47 x10E6/uL (ref 4.14–5.80)
RDW: 14 % (ref 12.3–15.4)
WBC: 4.7 10*3/uL (ref 3.4–10.8)

## 2016-09-18 LAB — LIPID PANEL
Chol/HDL Ratio: 3.8 ratio (ref 0.0–5.0)
Cholesterol, Total: 155 mg/dL (ref 100–199)
HDL: 41 mg/dL (ref >39)
LDL Calculated: 100 mg/dL — ABNORMAL HIGH (ref 0–99)
Triglycerides: 68 mg/dL (ref 0–149)
VLDL Cholesterol Cal: 14 mg/dL (ref 5–40)

## 2016-09-25 ENCOUNTER — Ambulatory Visit (INDEPENDENT_AMBULATORY_CARE_PROVIDER_SITE_OTHER): Payer: PPO | Admitting: Cardiology

## 2016-09-25 ENCOUNTER — Encounter: Payer: Self-pay | Admitting: Cardiology

## 2016-09-25 VITALS — BP 152/70 | HR 74 | Ht 71.0 in | Wt 184.0 lb

## 2016-09-25 DIAGNOSIS — I10 Essential (primary) hypertension: Secondary | ICD-10-CM | POA: Diagnosis not present

## 2016-09-25 DIAGNOSIS — I341 Nonrheumatic mitral (valve) prolapse: Secondary | ICD-10-CM

## 2016-09-25 DIAGNOSIS — I493 Ventricular premature depolarization: Secondary | ICD-10-CM

## 2016-09-25 DIAGNOSIS — Z789 Other specified health status: Secondary | ICD-10-CM | POA: Diagnosis not present

## 2016-09-25 DIAGNOSIS — E7800 Pure hypercholesterolemia, unspecified: Secondary | ICD-10-CM

## 2016-09-25 NOTE — Progress Notes (Signed)
Cardiology Office Note  Date:  09/25/2016   ID:  Preston Reeves, DOB December 20, 1940, MRN [id number]  PCP:  Binnie Rail, MD  Cardiologist: Darlin Coco MD  Chief complain: 6 months follow up, transition from Dr Mare Ferrari   History of Present Illness: Preston Reeves is a 76 y.o. male who presents for a six-month follow-up visit. The patient is being followed for mitral valve prolapse, hypertension hyperlipidemia. He also has a history of thyrotoxicosis, followed by Dr. Dwyane Dee. He has a history of hemochromatosis. He has a history of mitral valve prolapse. Since last visit he has had a lot of gastrointestinal problems. In 2005 he had surgery for a ruptured diverticular abscess. At that time he had a temporary colostomy which was subsequently closed. Over the past 10 years he had developed multiple abdominal hernias. He went to surgery on 10/13/13 by Dr. Johney Maine. Laparoscopic surgery was not possible and he had an open procedure.  The patient has a history of mitral valve prolapse.   09/26/2006 - the patient feels and looks great, he continues to golf several times a week, the patient denies any chest pain shortness of breath, he has no extremity edema no claudications and orthopnea. He doesn't feel any palpitations has no dizziness or syncope or falls. He's been compliant with his medications.   Past Medical History:  Diagnosis Date  . Allergy    SEASONAL  . Cataract   . Colon polyps    Adenomatous  . Diverticulitis of colon with perforation 2010   Colectomy/ostomy  . Diverticulosis    hx of  . Hemochromatosis    hx of  . History of BPH   . History of COPD   . Hyperlipidemia   . Hypothyroidism   . Incisional hernia   . MVP (mitral valve prolapse)   . Thyroid disease     Past Surgical History:  Procedure Laterality Date  . COLONOSCOPY    . COLONOSCOPY W/ BIOPSIES  2011   Dr Henrene Pastor  . COLOSTOMY TAKEDOWN  2010   Dr Barry Dienes  . INGUINAL HERNIA REPAIR Left 1999   Dr.  Rebekah Chesterfield  . INSERTION OF MESH N/A 10/13/2013   Procedure: INSERTION OF MESH;  Surgeon: Michael Boston, MD;  Location: [facility];  Service: General;  Laterality: N/A;  . LAPAROSCOPIC LYSIS OF ADHESIONS N/A 10/13/2013   Procedure: LAPAROSCOPIC LYSIS OF ADHESIONS;  Surgeon: Michael Boston, MD;  Location: [facility];  Service: General;  Laterality: N/A;  . PARTIAL COLECTOMY  08/12/2008   [facility]; perforated colon from diverticulitis  . UPPER GI ENDOSCOPY    . VENTRAL HERNIA REPAIR N/A 10/13/2013   Procedure: REPAIR OF VENTRAL WALL HERNIA WITH UNILATERAL COMPARTMENT SEPARATION;  Surgeon: Michael Boston, MD;  Location: [facility];  Service: General;  Laterality: N/A;     Current Outpatient Prescriptions  Medication Sig Dispense Refill  . Ascorbic Acid (VITAMIN C) 1000 MG tablet Take 1,000 mg by mouth daily.      Marland Kitchen aspirin 81 MG tablet Take 81 mg by mouth 2 (two) times daily.     . Calcium Carbonate-Vitamin D (CALCIUM + D PO) Take 1 tablet by mouth daily.     . Cholecalciferol (VITAMIN D) 2000 UNITS CAPS Take 2,000 Units by mouth daily.     Mariane Baumgarten Sodium (COLACE PO) Take by mouth.    . fexofenadine-pseudoephedrine (ALLEGRA-D 24) 180-240 MG per 24 hr tablet Take 1 tablet by mouth daily.    Marland Kitchen loratadine (CLARITIN) 10 MG tablet  Take 10 mg by mouth daily as needed for allergies. As needed    . metoprolol succinate (TOPROL-XL) 25 MG 24 hr tablet Take 1 tablet (25 mg total) by mouth daily. 90 tablet 1  . Multiple Vitamins-Minerals (MULTIVITAMIN PO) Take 1 tablet by mouth daily.     . Omega-3 Fatty Acids (FISH OIL PO) Take 900 mg by mouth daily. Taking 900 daily    . Red Yeast Rice 600 MG CAPS Take 1,200 mg by mouth daily.     Marland Kitchen SALINE NASAL SPRAY NA Place 1 spray into the nose daily as needed (allergies).     . SYNTHROID 150 MCG tablet TAKE 1 TABLET BY MOUTH EVERY DAY 90 tablet 0  . vitamin B-12 (CYANOCOBALAMIN) 1000 MCG tablet Take 1,000 mcg by mouth daily.     No current facility-administered medications for this  visit.     Allergies:   Patient has no known allergies.    Social History:  The patient  reports that he has quit smoking. His smoking use included Cigarettes. He has never used smokeless tobacco. He reports that he drinks alcohol. He reports that he does not use drugs.   Family History:  The patient's family history includes Breast cancer in his mother; Colon cancer in his father; Diabetes in his father; Heart attack in his father; Heart disease in his father.    ROS:  Please see the history of present illness.   Otherwise, review of systems are positive for none.   All other systems are reviewed and negative.    PHYSICAL EXAM: VS:  BP (!) 152/70   Pulse 74   Ht 5\' 11"  (1.803 m)   Wt 184 lb (83.5 kg)   SpO2 93%   BMI 25.66 kg/m  , BMI Body mass index is 25.66 kg/m. GEN: Well nourished, well developed, in no acute distress  HEENT: normal  Neck: no JVD, carotid bruits, or masses Cardiac: RRR; 3/6 systolic murmur. no, rubs, or gallops,no edema  Respiratory:  clear to auscultation bilaterally, normal work of breathing GI: soft, nontender, nondistended, + BS MS: no deformity or atrophy  Skin: warm and dry, no rash Neuro:  Strength and sensation are intact Psych: euthymic mood, full affect   EKG:  EKG is ordered today. The ekg ordered today demonstrates normal sinus rhythm at 81 bpm.  Occasional PVCs.  Incomplete right bundle branch block.  Nonspecific ST-T wave changes.   Recent Labs: 12/23/2015: TSH 3.58 12/30/2015: ALT 15 09/18/2016: BUN 17; Creatinine, Ser 0.98; Hemoglobin 13.9; Platelets 241; Potassium 4.6; Sodium 139    Lipid Panel    Component Value Date/Time   CHOL 155 09/18/2016 0826   TRIG 68 09/18/2016 0826   HDL 41 09/18/2016 0826   CHOLHDL 3.8 09/18/2016 0826   CHOLHDL 3.5 07/01/2015 0804   VLDL 14 07/01/2015 0804   LDLCALC 100 (H) 09/18/2016 0826   Wt Readings from Last 3 Encounters:  09/25/16 184 lb (83.5 kg)  01/08/16 186 lb (84.4 kg)  12/30/15 185  lb 1.9 oz (84 kg)    EKG performed today 07/03/2015 shows normal sinus rhythm rightward axis possible prior anterior MI age indeterminate there is no change from prior EKG on 01/11/2015.  TTE: 12/2015 - Left ventricle: The cavity size was normal. Systolic function was   vigorous. The estimated ejection fraction was in the range of 65%   to 70%. Wall motion was normal; there were no regional wall   motion abnormalities. Doppler parameters are consistent with  abnormal left ventricular relaxation (grade 1 diastolic   dysfunction). There was no evidence of elevated ventricular   filling pressure by Doppler parameters. - Aortic valve: Valve mobility was restricted. There was mild   stenosis. There was no regurgitation. - Aortic root: The aortic root was normal in size. - Ascending aorta: The ascending aorta was normal in size. - Mitral valve: There was trivial regurgitation. - Left atrium: The atrium was normal in size. - Right ventricle: Systolic function was normal. - Right atrium: The atrium was normal in size. - Tricuspid valve: There was trivial regurgitation. - Pulmonary arteries: Systolic pressure was at the upper limits of   normal. PA peak pressure: 31 mm Hg (S). - Inferior vena cava: The vessel was normal in size. - Pericardium, extracardiac: There was no pericardial effusion.  EKG performed today 09/25/2016 showed normal sinus rhythm with frequent PVCs and left bundle branch block unchanged from prior.   ASSESSMENT AND PLAN:  1.Mitral valve prolapse - no mitral valve prolapse and only trivial mitral regurgitation on the most recent echo in 12/2015 2. Hypercholesterolemia - well controlled on red yeast rice only, LDL was 100 and triglycerides 68, he has no known coronary artery disease. 3. history of hemachromatosis followed by Dr.Ennever.  He has not had to have any recent phlebotomies. 5. history of post-ablative hypothyroidism, on Synthroid, followed by Dr.  Dwyane Dee 6.Left bundle branch block and Frequent PVCs, this is chronic and unchanged his LVEF is normal, he is asymptomatic I won't be ordering any ischemia evaluation right now.  7. Essential hypertensionrecheck blood pressure 138/70.   Current medicines are reviewed at length with the patient today.  The patient does not have concerns regarding medicines.  The patient has a murmur and history of mitral prolapse but we don't have any available echocardiogram we'll order one. All his labs were checked last week and are normal. We will check lipids and CMP prior to next appointment in 6 months. He blood pressure is well controlled.  Orders Placed This Encounter  Procedures  . EKG 12-Lead    follow-up in 6 months.   Romeo Rabon   09/25/2016 10:06 AM    [facility] [place], [place], [place]  [address] Phone: [phone]; Fax: [phone]

## 2016-09-25 NOTE — Patient Instructions (Signed)
Medication Instructions:  Your physician recommends that you continue on your current medications as directed. Please refer to the Current Medication list given to you today.   Labwork: -None  Testing/Procedures: -None  Follow-Up: Your physician wants you to follow-up in: 6 months with Dr. Meda Coffee. You will receive a reminder letter in the mail two months in advance. If you don't receive a letter, please call our office to schedule the follow-up appointment.   Any Other Special Instructions Will Be Listed Below (If Applicable).     If you need a refill on your cardiac medications before your next appointment, please call your pharmacy.

## 2016-10-19 ENCOUNTER — Other Ambulatory Visit: Payer: Self-pay | Admitting: Endocrinology

## 2016-11-22 ENCOUNTER — Encounter: Payer: Self-pay | Admitting: Internal Medicine

## 2016-11-22 NOTE — Patient Instructions (Addendum)
Preston Reeves , Thank you for taking time to come for your Medicare Wellness Visit. I appreciate your ongoing commitment to your health goals. Please review the following plan we discussed and let me know if I can assist you in the future.   These are the goals we discussed: Goals    None      This is a list of the screening recommended for you and due dates:  Health Maintenance  Topic Date Due  . Pneumonia vaccines (2 of 2 - PPSV23) 06/07/2015  . Colon Cancer Screening  09/26/2018  . Tetanus Vaccine  06/21/2023  . Flu Shot  Completed     All other Health Maintenance issues reviewed.   All recommended immunizations and age-appropriate screenings are up-to-date or discussed.  No immunizations administered today.   Medications reviewed and updated.   No changes recommended at this time.   Please followup in one year    Health Maintenance, Male A healthy lifestyle and preventive care is important for your health and wellness. Ask your health care provider about what schedule of regular examinations is right for you. What should I know about weight and diet? Eat a Healthy Diet  Eat plenty of vegetables, fruits, whole grains, low-fat dairy products, and lean protein.  Do not eat a lot of foods high in solid fats, added sugars, or salt.  Maintain a Healthy Weight Regular exercise can help you achieve or maintain a healthy weight. You should:  Do at least 150 minutes of exercise each week. The exercise should increase your heart rate and make you sweat (moderate-intensity exercise).  Do strength-training exercises at least twice a week.  Watch Your Levels of Cholesterol and Blood Lipids  Have your blood tested for lipids and cholesterol every 5 years starting at 76 years of age. If you are at high risk for heart disease, you should start having your blood tested when you are 76 years old. You may need to have your cholesterol levels checked more often if: ? Your lipid or  cholesterol levels are high. ? You are older than 76 years of age. ? You are at high risk for heart disease.  What should I know about cancer screening? Many types of cancers can be detected early and may often be prevented. Lung Cancer  You should be screened every year for lung cancer if: ? You are a current smoker who has smoked for at least 30 years. ? You are a former smoker who has quit within the past 15 years.  Talk to your health care provider about your screening options, when you should start screening, and how often you should be screened.  Colorectal Cancer  Routine colorectal cancer screening usually begins at 76 years of age and should be repeated every 5-10 years until you are 76 years old. You may need to be screened more often if early forms of precancerous polyps or small growths are found. Your health care provider may recommend screening at an earlier age if you have risk factors for colon cancer.  Your health care provider may recommend using home test kits to check for hidden blood in the stool.  A small camera at the end of a tube can be used to examine your colon (sigmoidoscopy or colonoscopy). This checks for the earliest forms of colorectal cancer.  Prostate and Testicular Cancer  Depending on your age and overall health, your health care provider may do certain tests to screen for prostate and testicular cancer.  Talk to your health care provider about any symptoms or concerns you have about testicular or prostate cancer.  Skin Cancer  Check your skin from head to toe regularly.  Tell your health care provider about any new moles or changes in moles, especially if: ? There is a change in a mole's size, shape, or color. ? You have a mole that is larger than a pencil eraser.  Always use sunscreen. Apply sunscreen liberally and repeat throughout the day.  Protect yourself by wearing long sleeves, pants, a wide-brimmed hat, and sunglasses when  outside.  What should I know about heart disease, diabetes, and high blood pressure?  If you are 18-29 years of age, have your blood pressure checked every 3-5 years. If you are 73 years of age or older, have your blood pressure checked every year. You should have your blood pressure measured twice-once when you are at a hospital or clinic, and once when you are not at a hospital or clinic. Record the average of the two measurements. To check your blood pressure when you are not at a hospital or clinic, you can use: ? An automated blood pressure machine at a pharmacy. ? A home blood pressure monitor.  Talk to your health care provider about your target blood pressure.  If you are between 60-50 years old, ask your health care provider if you should take aspirin to prevent heart disease.  Have regular diabetes screenings by checking your fasting blood sugar level. ? If you are at a normal weight and have a low risk for diabetes, have this test once every three years after the age of 70. ? If you are overweight and have a high risk for diabetes, consider being tested at a younger age or more often.  A one-time screening for abdominal aortic aneurysm (AAA) by ultrasound is recommended for men aged 25-75 years who are current or former smokers. What should I know about preventing infection? Hepatitis B If you have a higher risk for hepatitis B, you should be screened for this virus. Talk with your health care provider to find out if you are at risk for hepatitis B infection. Hepatitis C Blood testing is recommended for:  Everyone born from [birth year] through 1965.  Anyone with known risk factors for hepatitis C.  Sexually Transmitted Diseases (STDs)  You should be screened each year for STDs including gonorrhea and chlamydia if: ? You are sexually active and are younger than 76 years of age. ? You are older than 76 years of age and your health care provider tells you that you are at risk for this  type of infection. ? Your sexual activity has changed since you were last screened and you are at an increased risk for chlamydia or gonorrhea. Ask your health care provider if you are at risk.  Talk with your health care provider about whether you are at high risk of being infected with HIV. Your health care provider may recommend a prescription medicine to help prevent HIV infection.  What else can I do?  Schedule regular health, dental, and eye exams.  Stay current with your vaccines (immunizations).  Do not use any tobacco products, such as cigarettes, chewing tobacco, and e-cigarettes. If you need help quitting, ask your health care provider.  Limit alcohol intake to no more than 2 drinks per day. One drink equals 12 ounces of beer, 5 ounces of wine, or 1 ounces of hard liquor.  Do not use street drugs.  Do  not share needles.  Ask your health care provider for help if you need support or information about quitting drugs.  Tell your health care provider if you often feel depressed.  Tell your health care provider if you have ever been abused or do not feel safe at home. This information is not intended to replace advice given to you by your health care provider. Make sure you discuss any questions you have with your health care provider. Document Released: 07/11/2007 Document Revised: 09/11/2015 Document Reviewed: 10/16/2014 Elsevier Interactive Patient Education  [employer].

## 2016-11-22 NOTE — Progress Notes (Signed)
Subjective:    Patient ID: Preston Reeves, male    DOB: 1940/08/26, 76 y.o.   MRN: [id number]  HPI Here for medicare wellness exam and annual physical exam.   I have personally reviewed and have noted 1.The patient's medical and social history 2.Their use of alcohol, tobacco or illicit drugs 3.Their current medications and supplements 4.The patient's functional ability including ADL's, fall risks, home                 safety risk and hearing or visual impairment. 5.Diet and physical activities 6.Evidence for depression or mood disorders 7.Care team reviewed  - cardio - dr Meda Coffee, oncology - dr Marin Olp, endo - dr Dwyane Dee, urology - dr Alinda Money, ENT - Dr Clotilde Dieter    Are there smokers in your home (other than you)? No  Risk Factors Exercise:  Plays golf 2/week Dietary issues discussed:   Eats at home, well balanced -fruits, veges, protein  Vitamin and supplement use:  reviewed  Opiod use:  none  Cardiac risk factors: advanced age, hypertension, and obesity.    Depression Screen  Have you felt down, depressed or hopeless? No  Have you felt little interest or pleasure in doing things?  No  Activities of Daily Living In your present state of health, do you have any difficulty performing the following activities?:  Driving? No Managing money?  No Feeding yourself? No Getting from bed to chair? No Climbing a flight of stairs? No Preparing food and eating?: No Bathing or showering? No Getting dressed: No Getting to/using the toilet? No Moving around from place to place: No In the past year have you fallen or had a near fall?: No   Are you sexually active?  No  Do you have more than one partner?  N/A  Hearing Difficulties:  Do you often ask people to speak up or repeat themselves? Yes -right  Do you experience ringing or noises in your ears? No Do you have difficulty understanding soft or whispered voices? Yes,  right ear Vision:              Any change in vision:  mild             Up to date with eye exam:   yes  Memory:  Do you feel that you have a problem with memory? No  Do you often misplace items? No  Do you feel safe at home?  Yes  Cognitive Testing  Alert, Orientated? Yes  Normal Appearance? Yes  Recall of three objects?  Yes  Can perform simple calculations? Yes  Displays appropriate judgment? Yes  Can read the correct time from a watch face? Yes   Advanced Directives have been discussed with the patient? Yes - in place   Medications and allergies reviewed with patient and updated if appropriate.  Patient Active Problem List   Diagnosis Date Noted  . Diverticulitis of colon 10/09/2015  . Elevated PSA 10/09/2015  . PVC's (premature ventricular contractions) 07/25/2014  . Essential hypertension 07/25/2014  . Incisional hernias s/p lap/open VWH repair w mesh 06/20/2013  . Hypothyroidism 12/28/2012  . MVP (mitral valve prolapse)   . Diverticulosis   . History of BPH   . Hemochromatosis   . COPD (chronic obstructive pulmonary disease) ([place])     Current Outpatient Prescriptions on File Prior to Visit  Medication Sig Dispense Refill  . Ascorbic Acid (VITAMIN C) 1000 MG tablet Take 1,000 mg by mouth daily.      Marland Kitchen  aspirin 81 MG tablet Take 81 mg by mouth 2 (two) times daily.     . Calcium Carbonate-Vitamin D (CALCIUM + D PO) Take 1 tablet by mouth daily.     . Cholecalciferol (VITAMIN D) 2000 UNITS CAPS Take 2,000 Units by mouth daily.     Mariane Baumgarten Sodium (COLACE PO) Take by mouth.    . fexofenadine-pseudoephedrine (ALLEGRA-D 24) 180-240 MG per 24 hr tablet Take 1 tablet by mouth daily.    Marland Kitchen loratadine (CLARITIN) 10 MG tablet Take 10 mg by mouth daily as needed for allergies. As needed    . metoprolol succinate (TOPROL-XL) 25 MG 24 hr tablet Take 1 tablet (25 mg total) by mouth daily. 90 tablet 1  . Multiple Vitamins-Minerals (MULTIVITAMIN PO) Take 1 tablet by mouth daily.      . Omega-3 Fatty Acids (FISH OIL PO) Take 900 mg by mouth daily. Taking 900 daily    . Red Yeast Rice 600 MG CAPS Take 1,200 mg by mouth daily.     Marland Kitchen SALINE NASAL SPRAY NA Place 1 spray into the nose daily as needed (allergies).     . SYNTHROID 150 MCG tablet TAKE 1 TABLET BY MOUTH EVERY DAY 90 tablet 0  . vitamin B-12 (CYANOCOBALAMIN) 1000 MCG tablet Take 1,000 mcg by mouth daily.     No current facility-administered medications on file prior to visit.     Past Medical History:  Diagnosis Date  . Allergy    SEASONAL  . Cataract   . Colon polyps    Adenomatous  . Diverticulitis of colon with perforation 2010   Colectomy/ostomy  . Diverticulosis    hx of  . Hemochromatosis    hx of  . History of BPH   . History of COPD   . Hyperlipidemia   . Hypothyroidism   . Incisional hernia   . MVP (mitral valve prolapse)   . Thyroid disease     Past Surgical History:  Procedure Laterality Date  . COLONOSCOPY    . COLONOSCOPY W/ BIOPSIES  2011   Dr Henrene Pastor  . COLOSTOMY TAKEDOWN  2010   Dr Barry Dienes  . INGUINAL HERNIA REPAIR Left 1999   Dr. Rebekah Chesterfield  . INSERTION OF MESH N/A 10/13/2013   Procedure: INSERTION OF MESH;  Surgeon: Michael Boston, MD;  Location: [facility];  Service: General;  Laterality: N/A;  . LAPAROSCOPIC LYSIS OF ADHESIONS N/A 10/13/2013   Procedure: LAPAROSCOPIC LYSIS OF ADHESIONS;  Surgeon: Michael Boston, MD;  Location: [facility];  Service: General;  Laterality: N/A;  . PARTIAL COLECTOMY  08/12/2008   [facility]; perforated colon from diverticulitis  . UPPER GI ENDOSCOPY    . VENTRAL HERNIA REPAIR N/A 10/13/2013   Procedure: REPAIR OF VENTRAL WALL HERNIA WITH UNILATERAL COMPARTMENT SEPARATION;  Surgeon: Michael Boston, MD;  Location: [facility];  Service: General;  Laterality: N/A;    Social History   Social History  . Marital status: Married    Spouse name: N/A  . Number of children: N/A  . Years of education: N/A   Occupational History  . Retired Retired   Social History Main  Topics  . Smoking status: Former Smoker    Types: Cigarettes  . Smokeless tobacco: Never Used  . Alcohol use 0.0 oz/week     Comment: drinks beer weekly  . Drug use: No  . Sexual activity: Yes   Other Topics Concern  . None   Social History Narrative   Married. Education: [employer].    Family  History  Problem Relation Age of Onset  . Breast cancer Mother   . Colon cancer Father   . Heart disease Father   . Diabetes Father   . Heart attack Father   . Stroke Neg Hx     Review of Systems  Constitutional: Negative for chills, fatigue and fever.  HENT: Positive for hearing loss (right ear). Negative for tinnitus.   Eyes: Negative for visual disturbance.  Respiratory: Positive for shortness of breath (with exertion/stairs - no chnage). Negative for cough and wheezing.   Cardiovascular: Negative for chest pain, palpitations and leg swelling.  Gastrointestinal: Negative for abdominal pain, blood in stool, constipation, diarrhea and nausea.       No gerd  Genitourinary: Negative for dysuria and hematuria.  Musculoskeletal: Negative for arthralgias, back pain and myalgias.  Skin: Negative for color change and rash.  Neurological: Negative for dizziness, light-headedness and headaches.  Psychiatric/Behavioral: Negative for dysphoric mood. The patient is not nervous/anxious.        Objective:   Vitals:   11/23/16 0809  BP: (!) 160/84  Pulse: 69  Resp: 16  Temp: (!) 97.5 F (36.4 C)  SpO2: 92%   Filed Weights   11/23/16 0809  Weight: 182 lb (82.6 kg)   Body mass index is 25.38 kg/m.  Wt Readings from Last 3 Encounters:  11/23/16 182 lb (82.6 kg)  09/25/16 184 lb (83.5 kg)  01/08/16 186 lb (84.4 kg)     Physical Exam Constitutional: He appears well-developed and well-nourished. No distress.  HENT:  Head: Normocephalic and atraumatic.  Right Ear: External ear normal.  Left Ear: External ear normal.  Mouth/Throat: Oropharynx is clear and moist.  Normal ear  canals and TM b/l  Eyes: Conjunctivae and EOM are normal.  Neck: Neck supple. No tracheal deviation present. No thyromegaly present.  No carotid bruit  Cardiovascular: Normal rate, regular rhythm, normal heart sounds and intact distal pulses.   No murmur heard. Pulmonary/Chest: Effort normal and breath sounds normal. No respiratory distress. He has no wheezes. He has no rales.  Abdominal: Soft. He exhibits no distension. There is no tenderness.  Genitourinary: deferred  Musculoskeletal: He exhibits no edema.  Lymphadenopathy:   He has no cervical adenopathy.  Skin: Skin is warm and dry. He is not diaphoretic.  Psychiatric: He has a normal mood and affect. His behavior is normal.         Assessment & Plan:   Wellness Exam: Immunizations  Pneumovax due, flu, td up to date, shingrix discussed Colonoscopy   Up to date  Eye exams  Up to date  Hearing loss   Right ear only -  Sees ENT Memory concerns/difficulties    none Independent of ADLs   fully Stressed the importance of regular exercise   Patient received copy of preventative screening tests/immunizations recommended for the next 5-10 years.    Physical exam: Screening blood work   -  Blood work up to date and will be having additional labs in the next two months so we will defer blood work today Immunizations  Pneumovax up to date, flu, td up to date, shingrix discussed Colonoscopy   Up to date  Eye exams  Up to date  EKG   Last done 08/2016 Exercise  - plays golf 2 / week Weight   Weight is good for age Skin   No concerns Substance abuse      none  See Problem List for Assessment and Plan of chronic medical problems.  FU in one year

## 2016-11-23 ENCOUNTER — Ambulatory Visit (INDEPENDENT_AMBULATORY_CARE_PROVIDER_SITE_OTHER): Payer: PPO | Admitting: Internal Medicine

## 2016-11-23 VITALS — BP 160/84 | HR 69 | Temp 97.5°F | Resp 16 | Ht 71.0 in | Wt 182.0 lb

## 2016-11-23 DIAGNOSIS — Z87438 Personal history of other diseases of male genital organs: Secondary | ICD-10-CM | POA: Diagnosis not present

## 2016-11-23 DIAGNOSIS — I10 Essential (primary) hypertension: Secondary | ICD-10-CM | POA: Diagnosis not present

## 2016-11-23 DIAGNOSIS — R972 Elevated prostate specific antigen [PSA]: Secondary | ICD-10-CM | POA: Diagnosis not present

## 2016-11-23 DIAGNOSIS — Z0000 Encounter for general adult medical examination without abnormal findings: Secondary | ICD-10-CM | POA: Diagnosis not present

## 2016-11-23 DIAGNOSIS — E038 Other specified hypothyroidism: Secondary | ICD-10-CM | POA: Diagnosis not present

## 2016-11-23 NOTE — Assessment & Plan Note (Signed)
Management per Dr. Kumar 

## 2016-11-23 NOTE — Assessment & Plan Note (Signed)
Following with urology

## 2016-11-23 NOTE — Assessment & Plan Note (Signed)
Elevated here today and on repeat, but was better at cardio appt two months ago Advised him to monitor at home Continue current medications at current doses Last cmp reviewed

## 2016-11-23 NOTE — Assessment & Plan Note (Signed)
Following with Dr Marin Olp

## 2016-12-09 DIAGNOSIS — H6061 Unspecified chronic otitis externa, right ear: Secondary | ICD-10-CM | POA: Diagnosis not present

## 2016-12-09 DIAGNOSIS — H6121 Impacted cerumen, right ear: Secondary | ICD-10-CM | POA: Diagnosis not present

## 2016-12-21 ENCOUNTER — Other Ambulatory Visit (INDEPENDENT_AMBULATORY_CARE_PROVIDER_SITE_OTHER): Payer: PPO

## 2016-12-21 DIAGNOSIS — E890 Postprocedural hypothyroidism: Secondary | ICD-10-CM | POA: Diagnosis not present

## 2016-12-21 LAB — TSH: TSH: 2.64 u[IU]/mL (ref 0.35–4.50)

## 2016-12-21 LAB — T4, FREE: Free T4: 1.23 ng/dL (ref 0.60–1.60)

## 2016-12-23 ENCOUNTER — Ambulatory Visit: Payer: PPO | Admitting: Endocrinology

## 2016-12-23 ENCOUNTER — Encounter: Payer: Self-pay | Admitting: Endocrinology

## 2016-12-23 VITALS — BP 144/84 | HR 87 | Ht 71.0 in | Wt 184.2 lb

## 2016-12-23 DIAGNOSIS — E890 Postprocedural hypothyroidism: Secondary | ICD-10-CM | POA: Diagnosis not present

## 2016-12-23 NOTE — Progress Notes (Signed)
Reason for Appointment:  Hypothyroidism, followup visit    History of Present Illness:   The hypothyroidism was first diagnosed in 2007 after radioactive iodine treatment for Graves' disease   The patient has been treated with  Levoxyl, generic levothyroxine and brand name Synthroid  His last dose change was in 11/15 and at that time his TSH was slightly increased at 4.7 and his levothyroxine dose was increased to 150 g He thinks he may have felt a little less fatigue with this change   He says he is still getting  brand name Synthroid from [place] Does not complain of of unusual fatigue  Weight is about the same       The patient is taking the thyroid supplement consistently in the morning before breakfast.   He is taking calcium at night and also multivitamin separately He takes biotin for his nails and has not stopped this  TSH is consistently  normal  Lab Results  Component Value Date   TSH 2.64 12/21/2016   TSH 3.58 12/23/2015   TSH 3.44 06/06/2015   FREET4 1.23 12/21/2016   FREET4 1.20 12/23/2015   FREET4 1.34 12/17/2014    Wt Readings from Last 3 Encounters:  12/23/16 184 lb 3.2 oz (83.6 kg)  11/23/16 182 lb (82.6 kg)  09/25/16 184 lb (83.5 kg)    Allergies as of 12/23/2016   No Known Allergies     Medication List        Accurate as of 12/23/16  8:28 AM. Always use your most recent med list.          aspirin 81 MG tablet Take 81 mg by mouth 2 (two) times daily.   CALCIUM + D PO Take 1 tablet by mouth daily.   COLACE PO Take by mouth.   fexofenadine-pseudoephedrine 180-240 MG 24 hr tablet Commonly known as:  ALLEGRA-D 24 Take 1 tablet by mouth daily.   FISH OIL PO Take 900 mg by mouth daily. Taking 900 daily   loratadine 10 MG tablet Commonly known as:  CLARITIN Take 10 mg by mouth daily as needed for allergies. As needed   metoprolol succinate 25 MG 24 hr tablet Commonly known as:  TOPROL-XL Take 1 tablet (25 mg total) by  mouth daily.   MULTIVITAMIN PO Take 1 tablet by mouth daily.   Red Yeast Rice 600 MG Caps Take 1,200 mg by mouth daily.   SALINE NASAL SPRAY NA Place 1 spray into the nose daily as needed (allergies).   SYNTHROID 150 MCG tablet Generic drug:  levothyroxine TAKE 1 TABLET BY MOUTH EVERY DAY   vitamin B-12 1000 MCG tablet Commonly known as:  CYANOCOBALAMIN Take 1,000 mcg by mouth daily.   vitamin C 1000 MG tablet Take 1,000 mg by mouth daily.   Vitamin D 2000 units Caps Take 2,000 Units by mouth daily.       Allergies: No Known Allergies  Past Medical History:  Diagnosis Date  . Allergy    SEASONAL  . Cataract   . Colon polyps    Adenomatous  . Diverticulitis of colon with perforation 2010   Colectomy/ostomy  . Diverticulosis    hx of  . Hemochromatosis    hx of  . History of BPH   . History of COPD   . Hyperlipidemia   . Hypothyroidism   . Incisional hernia   . MVP (mitral valve prolapse)   . Thyroid disease     Past Surgical History:  Procedure  Laterality Date  . COLONOSCOPY    . COLONOSCOPY W/ BIOPSIES  2011   Dr Henrene Pastor  . COLOSTOMY TAKEDOWN  2010   Dr Barry Dienes  . INGUINAL HERNIA REPAIR Left 1999   Dr. Rebekah Chesterfield  . INSERTION OF MESH N/A 10/13/2013   Procedure: INSERTION OF MESH;  Surgeon: Michael Boston, MD;  Location: [facility];  Service: General;  Laterality: N/A;  . LAPAROSCOPIC LYSIS OF ADHESIONS N/A 10/13/2013   Procedure: LAPAROSCOPIC LYSIS OF ADHESIONS;  Surgeon: Michael Boston, MD;  Location: [place];  Service: General;  Laterality: N/A;  . PARTIAL COLECTOMY  08/12/2008   [facility]; perforated colon from diverticulitis  . UPPER GI ENDOSCOPY    . VENTRAL HERNIA REPAIR N/A 10/13/2013   Procedure: REPAIR OF VENTRAL WALL HERNIA WITH UNILATERAL COMPARTMENT SEPARATION;  Surgeon: Michael Boston, MD;  Location: [facility];  Service: General;  Laterality: N/A;    Family History  Problem Relation Age of Onset  . Breast cancer Mother   . Colon cancer Father   . Heart  disease Father   . Diabetes Father   . Heart attack Father   . Stroke Neg Hx     Social History:  reports that he has quit smoking. His smoking use included cigarettes. he has never used smokeless tobacco. He reports that he drinks alcohol. He reports that he does not use drugs.   ROS  Bp at home 146/80-82 Blood pressure was normal with cardiologist on second measurement  BP Readings from Last 3 Encounters:  12/23/16 (!) 144/84  11/23/16 (!) 160/84  09/25/16 (!) 152/70     Examination:   BP (!) 144/84   Pulse 87   Ht 5\' 11"  (1.803 m)   Wt 184 lb 3.2 oz (83.6 kg)   SpO2 94%   BMI 25.69 kg/m   Looks well No peripheral edema    Assessment   Hypothyroidism, post ablative treated with brand name Synthroid 150 g once a day Clinically looks euthyroid  TSH is again consistently normal He is able to remember to take his medication daily   Treatment:  The dose will be continued unchanged Given him the option of getting Synthroid directly from the Synthroid direct program in [place] Patient information given He will call if he has any unusual fatigue Follow up annually as before Continue to check blood pressure at home regularly  [facility] 12/23/2016, 8:28 AM

## 2016-12-28 ENCOUNTER — Other Ambulatory Visit (HOSPITAL_BASED_OUTPATIENT_CLINIC_OR_DEPARTMENT_OTHER): Payer: PPO

## 2016-12-28 ENCOUNTER — Encounter: Payer: Self-pay | Admitting: Hematology & Oncology

## 2016-12-28 ENCOUNTER — Other Ambulatory Visit: Payer: Self-pay

## 2016-12-28 ENCOUNTER — Ambulatory Visit (HOSPITAL_BASED_OUTPATIENT_CLINIC_OR_DEPARTMENT_OTHER): Payer: PPO | Admitting: Hematology & Oncology

## 2016-12-28 LAB — CBC WITH DIFFERENTIAL (CANCER CENTER ONLY)
BASO#: 0 10*3/uL (ref 0.0–0.2)
BASO%: 0.2 % (ref 0.0–2.0)
EOS ABS: 0.3 10*3/uL (ref 0.0–0.5)
EOS%: 5.3 % (ref 0.0–7.0)
HEMATOCRIT: 43.6 % (ref 38.7–49.9)
HEMOGLOBIN: 15 g/dL (ref 13.0–17.1)
LYMPH#: 1.2 10*3/uL (ref 0.9–3.3)
LYMPH%: 23.9 % (ref 14.0–48.0)
MCH: 32.3 pg (ref 28.0–33.4)
MCHC: 34.4 g/dL (ref 32.0–35.9)
MCV: 94 fL (ref 82–98)
MONO#: 0.5 10*3/uL (ref 0.1–0.9)
MONO%: 9.5 % (ref 0.0–13.0)
NEUT#: 3.1 10*3/uL (ref 1.5–6.5)
NEUT%: 61.1 % (ref 40.0–80.0)
Platelets: 225 10*3/uL (ref 145–400)
RBC: 4.64 10*6/uL (ref 4.20–5.70)
RDW: 13.2 % (ref 11.1–15.7)
WBC: 5.1 10*3/uL (ref 4.0–10.0)

## 2016-12-28 LAB — FERRITIN: Ferritin: 23 ng/ml (ref 22–316)

## 2016-12-28 LAB — IRON AND TIBC
%SAT: 39 % (ref 20–55)
Iron: 88 ug/dL (ref 42–163)
TIBC: 225 ug/dL (ref 202–409)
UIBC: 137 ug/dL (ref 117–376)

## 2016-12-28 MED ORDER — METOPROLOL SUCCINATE ER 50 MG PO TB24: 50.0000 mg | ORAL_TABLET | Freq: Every day | ORAL | 3 refills | Status: DC

## 2016-12-28 NOTE — Progress Notes (Signed)
Hematology and Oncology Follow Up Visit  RYO KLANG [id number] 11-Dec-1940 76 y.o. 12/28/2016   Principle Diagnosis:   Hemochromatosis  Current Therapy:    Phlebotomy to maintain ferritin less than 100     Interim History:  Mr. Bury is back for follow-up.  Everything is going well.  He has had no problems since we last saw him a year ago.  He and his wife will be going down to [place] for 3 months this winter.  His wife will not let him donate to the [facility].  I am not sure why.  I think it is okay for him to donate to the [facility].  When we  last saw him a year ago, his ferritin was only 23 with an iron saturation of 69%.  I think the iron saturation is a little on the higher side.  He has had no fever.  He has had no cough or shortness of breath.  He has had no change in bowel or bladder habits.  There is been no leg swelling.  He has had no rashes.   Overall, his performance status is ECOG 0.  Medications:  Current Outpatient Medications:  .  Ascorbic Acid (VITAMIN C) 1000 MG tablet, Take 1,000 mg by mouth daily.  , Disp: , Rfl:  .  aspirin 81 MG tablet, Take 81 mg by mouth 2 (two) times daily. , Disp: , Rfl:  .  Calcium Carbonate-Vitamin D (CALCIUM + D PO), Take 1 tablet by mouth daily. , Disp: , Rfl:  .  Cholecalciferol (VITAMIN D) 2000 UNITS CAPS, Take 2,000 Units by mouth daily. , Disp: , Rfl:  .  Docusate Sodium (COLACE PO), Take by mouth., Disp: , Rfl:  .  fexofenadine-pseudoephedrine (ALLEGRA-D 24) 180-240 MG per 24 hr tablet, Take 1 tablet by mouth daily., Disp: , Rfl:  .  metoprolol succinate (TOPROL-XL) 50 MG 24 hr tablet, Take 1 tablet (50 mg total) by mouth daily., Disp: 90 tablet, Rfl: 3 .  Multiple Vitamins-Minerals (MULTIVITAMIN PO), Take 1 tablet by mouth daily. , Disp: , Rfl:  .  Omega-3 Fatty Acids (FISH OIL PO), Take 900 mg by mouth daily. Taking 900 daily, Disp: , Rfl:  .  Red Yeast Rice 600 MG CAPS, Take 1,200 mg by mouth daily. , Disp: , Rfl:  .   SALINE NASAL SPRAY NA, Place 1 spray into the nose daily as needed (allergies). , Disp: , Rfl:  .  SYNTHROID 150 MCG tablet, TAKE 1 TABLET BY MOUTH EVERY DAY, Disp: 90 tablet, Rfl: 0 .  vitamin B-12 (CYANOCOBALAMIN) 1000 MCG tablet, Take 1,000 mcg by mouth daily., Disp: , Rfl:   Allergies: No Known Allergies  Past Medical History, Surgical history, Social history, and Family History were reviewed and updated.  Review of Systems: As stated in the interim history  Physical Exam:  weight is 185 lb (83.9 kg). His oral temperature is 98 F (36.7 C). His blood pressure is 179/92 (abnormal) and his pulse is 91. His oxygen saturation is 94%.   Wt Readings from Last 3 Encounters:  12/28/16 185 lb (83.9 kg)  12/23/16 184 lb 3.2 oz (83.6 kg)  11/23/16 182 lb (82.6 kg)     I saw Mr. Somers.  My examination findings are noted below with appropriate changes:   Well-developed and well-nourished white gentleman in no obvious distress. Head and neck exam shows no ocular or oral lesions. There are no palpable cervical or supraclavicular lymph nodes. Lungs are  clear. Cardiac exam is regular rate and rhythm with no murmurs, rubs or bruits. Abdomen is soft.  He has s good bowel sound. There is no fluid wave. There is no palpable liver or spleen tip. Back exam shows no tenderness over the spine, ribs or hips. Extremities shows no clubbing, cyanosis or edema. Skin exam shows no rashes, ecchymoses or petechia.  Neurological exam shows no focal neurological deficits.  Lab Results  Component Value Date   WBC 5.1 12/28/2016   HGB 15.0 12/28/2016   HCT 43.6 12/28/2016   MCV 94 12/28/2016   PLT 225 12/28/2016     Chemistry      Component Value Date/Time   NA 139 09/18/2016 0826   NA 138 12/30/2015 1307   K 4.6 09/18/2016 0826   K 4.1 12/30/2015 1307   CL 100 09/18/2016 0826   CO2 25 09/18/2016 0826   CO2 24 12/30/2015 1307   BUN 17 09/18/2016 0826   BUN 17.4 12/30/2015 1307   CREATININE 0.98  09/18/2016 0826   CREATININE 0.9 12/30/2015 1307      Component Value Date/Time   CALCIUM 9.6 09/18/2016 0826   CALCIUM 9.6 12/30/2015 1307   ALKPHOS 69 12/30/2015 1307   AST 23 12/30/2015 1307   ALT 15 12/30/2015 1307   BILITOT 0.75 12/30/2015 1307         Impression and Plan: Mr. Doris is a 76 year old gentleman with hemochromatosis.  From my point of view, I really think he can donate to the [employer].  I think this would be incredibly helpful.  His blood pressure and heart rate are up from my point of view.  I repeated his blood pressure and it was still 170/90.  I will increase his metoprolol to 50 mg daily.  He will call his cardiologist to get a follow-up appointment.  [name], MD 12/3/201810:12 AM

## 2016-12-29 ENCOUNTER — Telehealth: Payer: Self-pay | Admitting: *Deleted

## 2016-12-29 NOTE — Telephone Encounter (Addendum)
Patient's wife aware of results. Patient donated blood yesterday  ----- Message from Preston Napoleon, MD sent at 12/28/2016  3:27 PM EST ----- Call - iron level is ok!!  Please go to the [facility] for blood donation!!!  Laurey Arrow

## 2017-01-14 ENCOUNTER — Other Ambulatory Visit: Payer: Self-pay | Admitting: Endocrinology

## 2017-04-12 DIAGNOSIS — J22 Unspecified acute lower respiratory infection: Secondary | ICD-10-CM | POA: Diagnosis not present

## 2017-04-12 DIAGNOSIS — R509 Fever, unspecified: Secondary | ICD-10-CM | POA: Diagnosis not present

## 2017-04-18 ENCOUNTER — Other Ambulatory Visit: Payer: Self-pay | Admitting: Endocrinology

## 2017-05-12 DIAGNOSIS — H6122 Impacted cerumen, left ear: Secondary | ICD-10-CM | POA: Diagnosis not present

## 2017-05-20 ENCOUNTER — Encounter: Payer: Self-pay | Admitting: Cardiology

## 2017-05-31 ENCOUNTER — Ambulatory Visit: Payer: PPO | Admitting: Cardiology

## 2017-06-07 ENCOUNTER — Ambulatory Visit: Payer: PPO | Admitting: Cardiology

## 2017-06-07 ENCOUNTER — Encounter: Payer: Self-pay | Admitting: Cardiology

## 2017-06-07 VITALS — BP 146/82 | HR 59 | Ht 71.0 in | Wt 181.4 lb

## 2017-06-07 DIAGNOSIS — I10 Essential (primary) hypertension: Secondary | ICD-10-CM

## 2017-06-07 DIAGNOSIS — I493 Ventricular premature depolarization: Secondary | ICD-10-CM

## 2017-06-07 DIAGNOSIS — E7800 Pure hypercholesterolemia, unspecified: Secondary | ICD-10-CM | POA: Diagnosis not present

## 2017-06-07 DIAGNOSIS — I447 Left bundle-branch block, unspecified: Secondary | ICD-10-CM | POA: Diagnosis not present

## 2017-06-07 NOTE — Patient Instructions (Signed)
Medication Instructions:   Your physician recommends that you continue on your current medications as directed. Please refer to the Current Medication list given to you today.    Labwork:  PRIOR TO YOUR 6 MONTH FOLLOW-UP APPOINTMENT WITH DR NELSON TO CHECK--CMET, CBC W DIFF, TSH, AND LIPIDS--PLEASE COME FASTING TO THIS LAB APPOINTMENT     Follow-Up:  Your physician wants you to follow-up in: [place] will receive a reminder letter in the mail two months in advance. If you don't receive a letter, please call our office to schedule the follow-up appointment.  PLEASE HAVE YOUR LABS DONE PRIOR TO THIS APPOINTMENT        If you need a refill on your cardiac medications before your next appointment, please call your pharmacy.

## 2017-06-07 NOTE — Progress Notes (Signed)
Cardiology Office Note  Date:  06/07/2017   ID:  Preston Reeves, DOB Mar 15, 1940, MRN [id number]  PCP:  Binnie Rail, MD  Cardiologist: Darlin Coco MD  Reason for visit: 6 months follow up   History of Present Illness: Preston Reeves is a 77 y.o. male who presents for a six-month follow-up visit. The patient is being followed for mitral valve prolapse, hypertension hyperlipidemia. He also has a history of thyrotoxicosis, followed by Dr. Dwyane Dee. He has a history of hemochromatosis. He has a history of mitral valve prolapse. Since last visit he has had a lot of gastrointestinal problems. In 2005 he had surgery for a ruptured diverticular abscess. At that time he had a temporary colostomy which was subsequently closed. Over the past 10 years he had developed multiple abdominal hernias. He went to surgery on 10/13/13 by Dr. Johney Maine. Laparoscopic surgery was not possible and he had an open procedure.  The patient has a history of mitral valve prolapse.   09/26/2006 - the patient feels and looks great, he continues to golf several times a week, the patient denies any chest pain shortness of breath, he has no extremity edema no claudications and orthopnea. He doesn't feel any palpitations has no dizziness or syncope or falls. He's been compliant with his medications.  06/07/2017 - 6 months follow up, no complains, he continues to play golf - 18 holes twice a week, no CP or SOB, no claudications, no LE edema.  Past Medical History:  Diagnosis Date  . Allergy    SEASONAL  . Cataract   . Colon polyps    Adenomatous  . Diverticulitis of colon with perforation 2010   Colectomy/ostomy  . Diverticulosis    hx of  . Hemochromatosis    hx of  . History of BPH   . History of COPD   . Hyperlipidemia   . Hypothyroidism   . Incisional hernia   . MVP (mitral valve prolapse)   . Thyroid disease     Past Surgical History:  Procedure Laterality Date  . COLONOSCOPY    . COLONOSCOPY  W/ BIOPSIES  2011   Dr Henrene Pastor  . COLOSTOMY TAKEDOWN  2010   Dr Barry Dienes  . INGUINAL HERNIA REPAIR Left 1999   Dr. Rebekah Chesterfield  . INSERTION OF MESH N/A 10/13/2013   Procedure: INSERTION OF MESH;  Surgeon: Michael Boston, MD;  Location: [facility];  Service: General;  Laterality: N/A;  . LAPAROSCOPIC LYSIS OF ADHESIONS N/A 10/13/2013   Procedure: LAPAROSCOPIC LYSIS OF ADHESIONS;  Surgeon: Michael Boston, MD;  Location: [facility];  Service: General;  Laterality: N/A;  . PARTIAL COLECTOMY  08/12/2008   [facility]; perforated colon from diverticulitis  . UPPER GI ENDOSCOPY    . VENTRAL HERNIA REPAIR N/A 10/13/2013   Procedure: REPAIR OF VENTRAL WALL HERNIA WITH UNILATERAL COMPARTMENT SEPARATION;  Surgeon: Michael Boston, MD;  Location: [facility];  Service: General;  Laterality: N/A;     Current Outpatient Medications  Medication Sig Dispense Refill  . Ascorbic Acid (VITAMIN C) 1000 MG tablet Take 1,000 mg by mouth daily.      Marland Kitchen aspirin 81 MG tablet Take 81 mg by mouth daily.     . Calcium Carbonate-Vitamin D (CALCIUM + D PO) Take 1 tablet by mouth daily.     . Cholecalciferol (VITAMIN D) 2000 UNITS CAPS Take 2,000 Units by mouth daily.     Mariane Baumgarten Sodium (COLACE PO) Take by mouth.    . fexofenadine-pseudoephedrine (ALLEGRA-D  24) 180-240 MG per 24 hr tablet Take 1 tablet by mouth daily.    . metoprolol succinate (TOPROL-XL) 50 MG 24 hr tablet Take 1 tablet (50 mg total) by mouth daily. 90 tablet 3  . Multiple Vitamins-Minerals (MULTIVITAMIN PO) Take 1 tablet by mouth daily.     . Omega-3 Fatty Acids (FISH OIL PO) Take 900 mg by mouth daily. Taking 900 daily    . Red Yeast Rice 600 MG CAPS Take 1,200 mg by mouth daily.     Marland Kitchen SALINE NASAL SPRAY NA Place 1 spray into the nose daily as needed (allergies).     . SYNTHROID 150 MCG tablet TAKE 1 TABLET BY MOUTH EVERY DAY 90 tablet 1  . vitamin B-12 (CYANOCOBALAMIN) 1000 MCG tablet Take 1,000 mcg by mouth daily.     No current facility-administered medications for this visit.      Allergies:   Patient has no known allergies.    Social History:  The patient  reports that he has quit smoking. His smoking use included cigarettes. He has never used smokeless tobacco. He reports that he drinks alcohol. He reports that he does not use drugs.   Family History:  The patient's family history includes Breast cancer in his mother; Colon cancer in his father; Diabetes in his father; Heart attack in his father; Heart disease in his father.    ROS:  Please see the history of present illness.   Otherwise, review of systems are positive for none.   All other systems are reviewed and negative.    PHYSICAL EXAM: VS:  BP (!) 146/82   Pulse (!) 59   Ht _0  (1.803 m)   Wt 181 lb 6.4 oz (82.3 kg)   BMI 25.30 kg/m  , BMI Body mass index is 25.3 kg/m. GEN: Well nourished, well developed, in no acute distress  HEENT: normal  Neck: no JVD, carotid bruits, or masses Cardiac: RRR; 3/6 systolic murmur. no, rubs, or gallops,no edema  Respiratory:  clear to auscultation bilaterally, normal work of breathing GI: soft, nontender, nondistended, + BS MS: no deformity or atrophy  Skin: warm and dry, no rash Neuro:  Strength and sensation are intact Psych: euthymic mood, full affect   EKG:  EKG is ordered today. The ekg ordered today demonstrates normal sinus rhythm at 81 bpm.  Occasional PVCs.  Incomplete right bundle branch block.  Nonspecific ST-T wave changes.   Recent Labs: 09/18/2016: BUN 17; Creatinine, Ser 0.98; Potassium 4.6; Sodium 139 12/21/2016: TSH 2.64 12/28/2016: HGB 15.0; Platelets 225    Lipid Panel    Component Value Date/Time   CHOL 155 09/18/2016 0826   TRIG 68 09/18/2016 0826   HDL 41 09/18/2016 0826   CHOLHDL 3.8 09/18/2016 0826   CHOLHDL 3.5 07/01/2015 0804   VLDL 14 07/01/2015 0804   LDLCALC 100 (H) 09/18/2016 0826   Wt Readings from Last 3 Encounters:  06/07/17 181 lb 6.4 oz (82.3 kg)  12/28/16 185 lb (83.9 kg)  12/23/16 184 lb 3.2 oz (83.6  kg)    EKG performed today 07/03/2015 shows normal sinus rhythm rightward axis possible prior anterior MI age indeterminate there is no change from prior EKG on 01/11/2015.  TTE: 12/2015 - Left ventricle: The cavity size was normal. Systolic function was   vigorous. The estimated ejection fraction was in the range of 65%   to 70%. Wall motion was normal; there were no regional wall   motion abnormalities. Doppler parameters are consistent with  abnormal left ventricular relaxation (grade 1 diastolic   dysfunction). There was no evidence of elevated ventricular   filling pressure by Doppler parameters. - Aortic valve: Valve mobility was restricted. There was mild   stenosis. There was no regurgitation. - Aortic root: The aortic root was normal in size. - Ascending aorta: The ascending aorta was normal in size. - Mitral valve: There was trivial regurgitation. - Left atrium: The atrium was normal in size. - Right ventricle: Systolic function was normal. - Right atrium: The atrium was normal in size. - Tricuspid valve: There was trivial regurgitation. - Pulmonary arteries: Systolic pressure was at the upper limits of   normal. PA peak pressure: 31 mm Hg (S). - Inferior vena cava: The vessel was normal in size. - Pericardium, extracardiac: There was no pericardial effusion.  EKG performed today 06/07/2017 - SR, LBBB, PACs, PVCs, unchanged from prior.   ASSESSMENT AND PLAN:  1.Mitral valve prolapse - no mitral valve prolapse and only trivial mitral regurgitation on the most recent echo in 12/2015  2. Hypercholesterolemia - well controlled on red yeast rice only, he has no known coronary artery disease. All lipids at goal in 2018.  3. history of hemachromatosis followed by Dr.Ennever.  He has not had to have any recent phlebotomies. LVEF 65-70% on echo in 2017, no signs of cardiac involvement.   4. history of post-ablative hypothyroidism, on Synthroid, followed by Dr. Dwyane Dee, TSH  normal.  5.Left bundle branch block and Frequent PVCs, this is chronic and unchanged his LVEF is normal, he is asymptomatic I won't be ordering any ischemia evaluation right now.   6. Essential hypertension, controlled.   Current medicines are reviewed at length with the patient today.  The patient does not have concerns regarding medicines.  The patient has a murmur and history of mitral prolapse but we don't have any available echocardiogram we'll order one. All his labs were checked last week and are normal. We will check lipids and CMP prior to next appointment in 6 months. He blood pressure is well controlled.  Orders Placed This Encounter  Procedures  . Comp Met (CMET)  . CBC w/Diff  . Lipid Profile  . TSH  . EKG 12-Lead    follow-up in 6 months.   Romeo Rabon   06/07/2017 10:51 AM    [facility] [address], [place], [place]  [address] Phone: [phone]; Fax: [phone]

## 2017-06-23 DIAGNOSIS — N403 Nodular prostate with lower urinary tract symptoms: Secondary | ICD-10-CM | POA: Diagnosis not present

## 2017-06-30 DIAGNOSIS — R972 Elevated prostate specific antigen [PSA]: Secondary | ICD-10-CM | POA: Diagnosis not present

## 2017-06-30 DIAGNOSIS — N400 Enlarged prostate without lower urinary tract symptoms: Secondary | ICD-10-CM | POA: Diagnosis not present

## 2017-08-09 DIAGNOSIS — H40013 Open angle with borderline findings, low risk, bilateral: Secondary | ICD-10-CM | POA: Diagnosis not present

## 2017-08-16 DIAGNOSIS — H40013 Open angle with borderline findings, low risk, bilateral: Secondary | ICD-10-CM | POA: Diagnosis not present

## 2017-10-18 ENCOUNTER — Other Ambulatory Visit: Payer: Self-pay | Admitting: Endocrinology

## 2017-12-02 NOTE — Patient Instructions (Addendum)
Tests ordered today. Your results will be released to [facility] (or called to you) after review, usually within 72hours after test completion. If any changes need to be made, you will be notified at that same time.  All other Health Maintenance issues reviewed.   All recommended immunizations and age-appropriate screenings are up-to-date or discussed.  Flu immunization administered today.    Medications reviewed and updated.  Changes include :   none     Please followup in one year    Health Maintenance, Male A healthy lifestyle and preventive care is important for your health and wellness. Ask your health care provider about what schedule of regular examinations is right for you. What should I know about weight and diet? Eat a Healthy Diet  Eat plenty of vegetables, fruits, whole grains, low-fat dairy products, and lean protein.  Do not eat a lot of foods high in solid fats, added sugars, or salt.  Maintain a Healthy Weight Regular exercise can help you achieve or maintain a healthy weight. You should:  Do at least 150 minutes of exercise each week. The exercise should increase your heart rate and make you sweat (moderate-intensity exercise).  Do strength-training exercises at least twice a week.  Watch Your Levels of Cholesterol and Blood Lipids  Have your blood tested for lipids and cholesterol every 5 years starting at 77 years of age. If you are at high risk for heart disease, you should start having your blood tested when you are 77 years old. You may need to have your cholesterol levels checked more often if: ? Your lipid or cholesterol levels are high. ? You are older than 77 years of age. ? You are at high risk for heart disease.  What should I know about cancer screening? Many types of cancers can be detected early and may often be prevented. Lung Cancer  You should be screened every year for lung cancer if: ? You are a current smoker who has smoked for at least  30 years. ? You are a former smoker who has quit within the past 15 years.  Talk to your health care provider about your screening options, when you should start screening, and how often you should be screened.  Colorectal Cancer  Routine colorectal cancer screening usually begins at 77 years of age and should be repeated every 5-10 years until you are 77 years old. You may need to be screened more often if early forms of precancerous polyps or small growths are found. Your health care provider may recommend screening at an earlier age if you have risk factors for colon cancer.  Your health care provider may recommend using home test kits to check for hidden blood in the stool.  A small camera at the end of a tube can be used to examine your colon (sigmoidoscopy or colonoscopy). This checks for the earliest forms of colorectal cancer.  Prostate and Testicular Cancer  Depending on your age and overall health, your health care provider may do certain tests to screen for prostate and testicular cancer.  Talk to your health care provider about any symptoms or concerns you have about testicular or prostate cancer.  Skin Cancer  Check your skin from head to toe regularly.  Tell your health care provider about any new moles or changes in moles, especially if: ? There is a change in a mole's size, shape, or color. ? You have a mole that is larger than a pencil eraser.  Always use sunscreen.  Apply sunscreen liberally and repeat throughout the day.  Protect yourself by wearing long sleeves, pants, a wide-brimmed hat, and sunglasses when outside.  What should I know about heart disease, diabetes, and high blood pressure?  If you are 68-93 years of age, have your blood pressure checked every 3-5 years. If you are 52 years of age or older, have your blood pressure checked every year. You should have your blood pressure measured twice-once when you are at a hospital or clinic, and once when you  are not at a hospital or clinic. Record the average of the two measurements. To check your blood pressure when you are not at a hospital or clinic, you can use: ? An automated blood pressure machine at a pharmacy. ? A home blood pressure monitor.  Talk to your health care provider about your target blood pressure.  If you are between 28-68 years old, ask your health care provider if you should take aspirin to prevent heart disease.  Have regular diabetes screenings by checking your fasting blood sugar level. ? If you are at a normal weight and have a low risk for diabetes, have this test once every three years after the age of 54. ? If you are overweight and have a high risk for diabetes, consider being tested at a younger age or more often.  A one-time screening for abdominal aortic aneurysm (AAA) by ultrasound is recommended for men aged 40-75 years who are current or former smokers. What should I know about preventing infection? Hepatitis B If you have a higher risk for hepatitis B, you should be screened for this virus. Talk with your health care provider to find out if you are at risk for hepatitis B infection. Hepatitis C Blood testing is recommended for:  Everyone born from [birth year] through 1965.  Anyone with known risk factors for hepatitis C.  Sexually Transmitted Diseases (STDs)  You should be screened each year for STDs including gonorrhea and chlamydia if: ? You are sexually active and are younger than 77 years of age. ? You are older than 77 years of age and your health care provider tells you that you are at risk for this type of infection. ? Your sexual activity has changed since you were last screened and you are at an increased risk for chlamydia or gonorrhea. Ask your health care provider if you are at risk.  Talk with your health care provider about whether you are at high risk of being infected with HIV. Your health care provider may recommend a prescription medicine to  help prevent HIV infection.  What else can I do?  Schedule regular health, dental, and eye exams.  Stay current with your vaccines (immunizations).  Do not use any tobacco products, such as cigarettes, chewing tobacco, and e-cigarettes. If you need help quitting, ask your health care provider.  Limit alcohol intake to no more than 2 drinks per day. One drink equals 12 ounces of beer, 5 ounces of wine, or 1 ounces of hard liquor.  Do not use street drugs.  Do not share needles.  Ask your health care provider for help if you need support or information about quitting drugs.  Tell your health care provider if you often feel depressed.  Tell your health care provider if you have ever been abused or do not feel safe at home. This information is not intended to replace advice given to you by your health care provider. Make sure you discuss any questions you have  with your health care provider. Document Released: 07/11/2007 Document Revised: 09/11/2015 Document Reviewed: 10/16/2014 Elsevier Interactive Patient Education  [employer].

## 2017-12-02 NOTE — Progress Notes (Signed)
Subjective:    Patient ID: Preston Reeves, male    DOB: 1940/05/02, 77 y.o.   MRN: [id number]  HPI He is here for a physical exam.   He has no concerns.   He denies any changes in his history.   Medications and allergies reviewed with patient and updated if appropriate.  Patient Active Problem List   Diagnosis Date Noted  . Diverticulitis of colon 10/09/2015  . Elevated PSA 10/09/2015  . PVC's (premature ventricular contractions) 07/25/2014  . Essential hypertension 07/25/2014  . Incisional hernias s/p lap/open VWH repair w mesh 06/20/2013  . Hypothyroidism 12/28/2012  . MVP (mitral valve prolapse)   . Diverticulosis   . History of BPH   . Hemochromatosis   . COPD (chronic obstructive pulmonary disease) ([place])     Current Outpatient Medications on File Prior to Visit  Medication Sig Dispense Refill  . Ascorbic Acid (VITAMIN C) 1000 MG tablet Take 1,000 mg by mouth daily.      Marland Kitchen aspirin 81 MG tablet Take 81 mg by mouth daily.     . Calcium Carbonate-Vitamin D (CALCIUM + D PO) Take 1 tablet by mouth daily.     . Cholecalciferol (VITAMIN D) 2000 UNITS CAPS Take 2,000 Units by mouth daily.     Mariane Baumgarten Sodium (COLACE PO) Take by mouth.    . fexofenadine-pseudoephedrine (ALLEGRA-D 24) 180-240 MG per 24 hr tablet Take 1 tablet by mouth daily.    . metoprolol succinate (TOPROL-XL) 50 MG 24 hr tablet Take 1 tablet (50 mg total) by mouth daily. 90 tablet 3  . Multiple Vitamins-Minerals (MULTIVITAMIN PO) Take 1 tablet by mouth daily.     . Omega-3 Fatty Acids (FISH OIL PO) Take 900 mg by mouth daily. Taking 900 daily    . Red Yeast Rice 600 MG CAPS Take 1,200 mg by mouth daily.     Marland Kitchen SALINE NASAL SPRAY NA Place 1 spray into the nose daily as needed (allergies).     . SYNTHROID 150 MCG tablet TAKE 1 TABLET BY MOUTH EVERY DAY 90 tablet 0  . vitamin B-12 (CYANOCOBALAMIN) 1000 MCG tablet Take 1,000 mcg by mouth daily.     No current facility-administered medications on file prior to  visit.     Past Medical History:  Diagnosis Date  . Allergy    SEASONAL  . Cataract   . Colon polyps    Adenomatous  . Diverticulitis of colon with perforation 2010   Colectomy/ostomy  . Diverticulosis    hx of  . Hemochromatosis    hx of  . History of BPH   . History of COPD   . Hyperlipidemia   . Hypothyroidism   . Incisional hernia   . MVP (mitral valve prolapse)   . Thyroid disease     Past Surgical History:  Procedure Laterality Date  . COLONOSCOPY    . COLONOSCOPY W/ BIOPSIES  2011   Dr Henrene Pastor  . COLOSTOMY TAKEDOWN  2010   Dr Barry Dienes  . INGUINAL HERNIA REPAIR Left 1999   Dr. Rebekah Chesterfield  . INSERTION OF MESH N/A 10/13/2013   Procedure: INSERTION OF MESH;  Surgeon: Michael Boston, MD;  Location: [facility];  Service: General;  Laterality: N/A;  . LAPAROSCOPIC LYSIS OF ADHESIONS N/A 10/13/2013   Procedure: LAPAROSCOPIC LYSIS OF ADHESIONS;  Surgeon: Michael Boston, MD;  Location: [facility];  Service: General;  Laterality: N/A;  . PARTIAL COLECTOMY  08/12/2008   [facility]; perforated colon from diverticulitis  .  UPPER GI ENDOSCOPY    . VENTRAL HERNIA REPAIR N/A 10/13/2013   Procedure: REPAIR OF VENTRAL WALL HERNIA WITH UNILATERAL COMPARTMENT SEPARATION;  Surgeon: Michael Boston, MD;  Location: [facility];  Service: General;  Laterality: N/A;    Social History   Socioeconomic History  . Marital status: Married    Spouse name: Not on file  . Number of children: Not on file  . Years of education: Not on file  . Highest education level: Not on file  Occupational History  . Occupation: Retired    Fish farm manager: RETIRED  Social Needs  . Financial resource strain: Not on file  . Food insecurity:    Worry: Not on file    Inability: Not on file  . Transportation needs:    Medical: Not on file    Non-medical: Not on file  Tobacco Use  . Smoking status: Former Smoker    Types: Cigarettes  . Smokeless tobacco: Never Used  Substance and Sexual Activity  . Alcohol use: Yes    Alcohol/week: 0.0  standard drinks    Comment: drinks beer weekly  . Drug use: No  . Sexual activity: Yes  Lifestyle  . Physical activity:    Days per week: Not on file    Minutes per session: Not on file  . Stress: Not on file  Relationships  . Social connections:    Talks on phone: Not on file    Gets together: Not on file    Attends religious service: Not on file    Active member of club or organization: Not on file    Attends meetings of clubs or organizations: Not on file    Relationship status: Not on file  Other Topics Concern  . Not on file  Social History Narrative   Married. Education: [employer].    Family History  Problem Relation Age of Onset  . Breast cancer Mother   . Colon cancer Father   . Heart disease Father   . Diabetes Father   . Heart attack Father   . Stroke Neg Hx     Review of Systems  Constitutional: Negative for chills, fatigue and fever.  Eyes: Negative for visual disturbance.  Respiratory: Positive for shortness of breath (with certain activities). Negative for cough and wheezing.   Cardiovascular: Negative for chest pain, palpitations and leg swelling.  Gastrointestinal: Negative for abdominal pain, blood in stool, constipation, diarrhea and nausea.       Rare gerd  Genitourinary: Negative for dysuria and hematuria.  Musculoskeletal: Positive for back pain. Negative for arthralgias.  Skin: Negative for color change and rash.  Neurological: Negative for dizziness, light-headedness and headaches.  Psychiatric/Behavioral: Negative for dysphoric mood. The patient is not nervous/anxious.        Objective:   Vitals:   12/03/17 0812  BP: (!) 146/82  Pulse: 70  Resp: 16  Temp: (!) 97.5 F (36.4 C)  SpO2: 99%   Filed Weights   12/03/17 0812  Weight: 184 lb (83.5 kg)   Body mass index is 25.66 kg/m.  BP Readings from Last 3 Encounters:  12/03/17 (!) 146/82  06/07/17 (!) 146/82  12/28/16 (!) 179/92    Wt Readings from Last 3 Encounters:  12/03/17  184 lb (83.5 kg)  06/07/17 181 lb 6.4 oz (82.3 kg)  12/28/16 185 lb (83.9 kg)     Physical Exam Constitutional: He appears well-developed and well-nourished. No distress.  HENT:  Head: Normocephalic and atraumatic.  Right Ear: External ear normal.  Left Ear: External ear normal.  Mouth/Throat: Oropharynx is clear and moist.  Normal ear canals and TM b/l  Eyes: Conjunctivae and EOM are normal.  Neck: Neck supple. No tracheal deviation present. No thyromegaly present.  No carotid bruit  Cardiovascular: Normal rate, regular rhythm, normal heart sounds and intact distal pulses.   No murmur heard. Pulmonary/Chest: Effort normal and breath sounds normal. No respiratory distress. He has no wheezes. He has no rales.  Abdominal: Soft. He exhibits no distension. There is no tenderness.  Genitourinary: deferred  Musculoskeletal: He exhibits no edema.  Lymphadenopathy:   He has no cervical adenopathy.  Skin: Skin is warm and dry. He is not diaphoretic.  Psychiatric: He has a normal mood and affect. His behavior is normal.         Assessment & Plan:   Physical exam: Screening blood work ordered Immunizations    flu vaccine today, discussed shingles vaccine, others up-to-date Colonoscopy   up-to-date, due 08/2018 Eye exams   Up to date  EKG   done 05/2017 by cardiology Exercise   Play golf Weight   Normal BMI Skin   No concerns Substance abuse    none  See Problem List for Assessment and Plan of chronic medical problems.    FU in one year

## 2017-12-03 ENCOUNTER — Other Ambulatory Visit (INDEPENDENT_AMBULATORY_CARE_PROVIDER_SITE_OTHER): Payer: PPO

## 2017-12-03 ENCOUNTER — Ambulatory Visit (INDEPENDENT_AMBULATORY_CARE_PROVIDER_SITE_OTHER): Payer: PPO | Admitting: Internal Medicine

## 2017-12-03 ENCOUNTER — Encounter: Payer: Self-pay | Admitting: Internal Medicine

## 2017-12-03 VITALS — BP 146/82 | HR 70 | Temp 97.5°F | Resp 16 | Ht 71.0 in | Wt 184.0 lb

## 2017-12-03 DIAGNOSIS — R972 Elevated prostate specific antigen [PSA]: Secondary | ICD-10-CM

## 2017-12-03 DIAGNOSIS — Z23 Encounter for immunization: Secondary | ICD-10-CM | POA: Diagnosis not present

## 2017-12-03 DIAGNOSIS — E038 Other specified hypothyroidism: Secondary | ICD-10-CM | POA: Diagnosis not present

## 2017-12-03 DIAGNOSIS — J449 Chronic obstructive pulmonary disease, unspecified: Secondary | ICD-10-CM

## 2017-12-03 DIAGNOSIS — I10 Essential (primary) hypertension: Secondary | ICD-10-CM

## 2017-12-03 DIAGNOSIS — Z0000 Encounter for general adult medical examination without abnormal findings: Secondary | ICD-10-CM | POA: Diagnosis not present

## 2017-12-03 LAB — COMPREHENSIVE METABOLIC PANEL
ALBUMIN: 4.4 g/dL (ref 3.5–5.2)
ALT: 13 U/L (ref 0–53)
AST: 25 U/L (ref 0–37)
Alkaline Phosphatase: 60 U/L (ref 39–117)
BILIRUBIN TOTAL: 0.7 mg/dL (ref 0.2–1.2)
BUN: 15 mg/dL (ref 6–23)
CALCIUM: 9.6 mg/dL (ref 8.4–10.5)
CHLORIDE: 103 meq/L (ref 96–112)
CO2: 29 mEq/L (ref 19–32)
CREATININE: 0.99 mg/dL (ref 0.40–1.50)
GFR: 77.75 mL/min (ref >60.00)
Glucose, Bld: 107 mg/dL — ABNORMAL HIGH (ref 70–99)
Potassium: 4 mEq/L (ref 3.5–5.1)
SODIUM: 140 meq/L (ref 135–145)
TOTAL PROTEIN: 7.6 g/dL (ref 6.0–8.3)

## 2017-12-03 LAB — CBC WITH DIFFERENTIAL/PLATELET
BASOS ABS: 0 10*3/uL (ref 0.0–0.1)
BASOS PCT: 0.7 % (ref 0.0–3.0)
Eosinophils Absolute: 0.2 10*3/uL (ref 0.0–0.7)
Eosinophils Relative: 5.6 % — ABNORMAL HIGH (ref 0.0–5.0)
HCT: 40.5 % (ref 39.0–52.0)
HEMOGLOBIN: 13.9 g/dL (ref 13.0–17.0)
LYMPHS PCT: 22.2 % (ref 12.0–46.0)
Lymphs Abs: 0.8 10*3/uL (ref 0.7–4.0)
MCHC: 34.4 g/dL (ref 30.0–36.0)
MCV: 93.5 fl (ref 78.0–100.0)
MONO ABS: 0.5 10*3/uL (ref 0.1–1.0)
Monocytes Relative: 14.9 % — ABNORMAL HIGH (ref 3.0–12.0)
NEUTROS PCT: 56.6 % (ref 43.0–77.0)
Neutro Abs: 2.1 10*3/uL (ref 1.4–7.7)
Platelets: 214 10*3/uL (ref 150.0–400.0)
RBC: 4.33 Mil/uL (ref 4.22–5.81)
RDW: 14.6 % (ref 11.5–15.5)
WBC: 3.7 10*3/uL — AB (ref 4.0–10.5)

## 2017-12-03 LAB — LIPID PANEL
CHOLESTEROL: 154 mg/dL (ref 0–200)
HDL: 45.1 mg/dL (ref >39.00)
LDL CALC: 96 mg/dL (ref 0–99)
NonHDL: 108.46
TRIGLYCERIDES: 63 mg/dL (ref 0.0–149.0)
Total CHOL/HDL Ratio: 3
VLDL: 12.6 mg/dL (ref 0.0–40.0)

## 2017-12-03 LAB — IRON,TIBC AND FERRITIN PANEL
%SAT: 36 % (ref 20–48)
FERRITIN: 27 ng/mL (ref 24–380)
Iron: 91 ug/dL (ref 50–180)
TIBC: 253 mcg/dL (calc) (ref 250–425)

## 2017-12-03 LAB — TSH: TSH: 4.19 u[IU]/mL (ref 0.35–4.50)

## 2017-12-03 LAB — PSA, MEDICARE: PSA: 4.43 ng/mL — AB (ref 0.10–4.00)

## 2017-12-03 LAB — T4, FREE: FREE T4: 1.11 ng/dL (ref 0.60–1.60)

## 2017-12-03 NOTE — Addendum Note (Signed)
Addended by: Delice Bison E on: 12/03/2017 10:13 AM   Modules accepted: Orders

## 2017-12-03 NOTE — Assessment & Plan Note (Signed)
Stable, mild SOB with certain activities No change since last year Does not feel he needs an inhaler Continue regular activity

## 2017-12-03 NOTE — Assessment & Plan Note (Addendum)
BP slightly elevated here, but consistent only mildly elevated Continue current medications at current doses cmp

## 2017-12-03 NOTE — Assessment & Plan Note (Addendum)
Management per Dr Marin Olp - has appt coming up  Will check cbc, iron panel, cmp

## 2017-12-03 NOTE — Assessment & Plan Note (Addendum)
Following with Dr Dwyane Dee, has upcoming appt Will check tsh, ft4 since he is getting blood work done Management per above

## 2017-12-03 NOTE — Assessment & Plan Note (Addendum)
Following with urology - has appt Will check psa since he is getting blood work

## 2017-12-04 ENCOUNTER — Other Ambulatory Visit: Payer: Self-pay | Admitting: Hematology & Oncology

## 2017-12-05 ENCOUNTER — Encounter: Payer: Self-pay | Admitting: Internal Medicine

## 2017-12-13 ENCOUNTER — Ambulatory Visit: Payer: PPO | Admitting: Cardiology

## 2017-12-13 VITALS — BP 158/76 | HR 66 | Ht 70.5 in | Wt 184.0 lb

## 2017-12-13 DIAGNOSIS — E782 Mixed hyperlipidemia: Secondary | ICD-10-CM

## 2017-12-13 DIAGNOSIS — I10 Essential (primary) hypertension: Secondary | ICD-10-CM | POA: Diagnosis not present

## 2017-12-13 DIAGNOSIS — I447 Left bundle-branch block, unspecified: Secondary | ICD-10-CM | POA: Diagnosis not present

## 2017-12-13 DIAGNOSIS — I341 Nonrheumatic mitral (valve) prolapse: Secondary | ICD-10-CM

## 2017-12-13 DIAGNOSIS — E785 Hyperlipidemia, unspecified: Secondary | ICD-10-CM

## 2017-12-13 MED ORDER — LOSARTAN POTASSIUM 25 MG PO TABS: 25.0000 mg | ORAL_TABLET | Freq: Every day | ORAL | 3 refills | Status: DC

## 2017-12-13 NOTE — Patient Instructions (Signed)
Medication Instructions:   START TAKING LOSARTAN 25 MG ONCE DAILY  If you need a refill on your cardiac medications before your next appointment, please call your pharmacy.      Lab work:   IN 3 WEEKS--SAME DAY AS YOU SEE OUR PHARMACIST IN [facility]--WILL CHECK A BMET If you have labs (blood work) drawn today and your tests are completely normal, you will receive your results only by: Marland Kitchen MyChart Message (if you have MyChart) OR . A paper copy in the mail If you have any lab test that is abnormal or we need to change your treatment, we will call you to review the results.     Follow-Up:  3 WEEKS IN [facility] TO SEE OUR PHARMACIST--YOU WILL HAVE LAB DONE (BMET) SAME DAY AS YOU COME IN FOR THIS APPOINTMENT   Your physician wants you to follow-up in: [place] will receive a reminder letter in the mail two months in advance. If you don't receive a letter, please call our office to schedule the follow-up appointment.

## 2017-12-13 NOTE — Progress Notes (Signed)
Cardiology Office Note  Date:  12/13/2017   ID:  Preston Reeves, DOB 08-27-1940, MRN [id number]  PCP:  Binnie Rail, MD  Cardiologist: Darlin Coco MD  Reason for visit: 6 months follow up   History of Present Illness: Preston Reeves is a 77 y.o. male who presents for a six-month follow-up visit. The patient is being followed for mitral valve prolapse, hypertension hyperlipidemia. He also has a history of thyrotoxicosis, followed by Dr. Dwyane Dee. He has a history of hemochromatosis. He has a history of mitral valve prolapse. Since last visit he has had a lot of gastrointestinal problems. In 2005 he had surgery for a ruptured diverticular abscess. At that time he had a temporary colostomy which was subsequently closed. Over the past 10 years he had developed multiple abdominal hernias. He went to surgery on 10/13/13 by Dr. Johney Maine. Laparoscopic surgery was not possible and he had an open procedure.  The patient has a history of mitral valve prolapse.   12/13/2017 -6 months follow-up, the patient is doing great, no change in symptoms, no chest pain or shortness of breath, continues to play golf twice a week, no dizziness or falls.  No side effects from his medicines.  He does not check his blood pressure at home but last week at his PCP his blood pressure systolic was 400 mmHg.  Past Medical History:  Diagnosis Date  . Allergy    SEASONAL  . Cataract   . Colon polyps    Adenomatous  . Diverticulitis of colon with perforation 2010   Colectomy/ostomy  . Diverticulosis    hx of  . Hemochromatosis    hx of  . History of BPH   . History of COPD   . Hyperlipidemia   . Hypothyroidism   . Incisional hernia   . MVP (mitral valve prolapse)   . Thyroid disease     Past Surgical History:  Procedure Laterality Date  . COLONOSCOPY    . COLONOSCOPY W/ BIOPSIES  2011   Dr Henrene Pastor  . COLOSTOMY TAKEDOWN  2010   Dr Barry Dienes  . INGUINAL HERNIA REPAIR Left 1999   Dr. Rebekah Chesterfield  .  INSERTION OF MESH N/A 10/13/2013   Procedure: INSERTION OF MESH;  Surgeon: Michael Boston, MD;  Location: [facility];  Service: General;  Laterality: N/A;  . LAPAROSCOPIC LYSIS OF ADHESIONS N/A 10/13/2013   Procedure: LAPAROSCOPIC LYSIS OF ADHESIONS;  Surgeon: Michael Boston, MD;  Location: [facility];  Service: General;  Laterality: N/A;  . PARTIAL COLECTOMY  08/12/2008   [facility]; perforated colon from diverticulitis  . UPPER GI ENDOSCOPY    . VENTRAL HERNIA REPAIR N/A 10/13/2013   Procedure: REPAIR OF VENTRAL WALL HERNIA WITH UNILATERAL COMPARTMENT SEPARATION;  Surgeon: Michael Boston, MD;  Location: [facility];  Service: General;  Laterality: N/A;     Current Outpatient Medications  Medication Sig Dispense Refill  . Ascorbic Acid (VITAMIN C) 1000 MG tablet Take 1,000 mg by mouth daily.      Marland Kitchen aspirin 81 MG tablet Take 81 mg by mouth daily.     . Calcium Carbonate-Vitamin D (CALCIUM + D PO) Take 1 tablet by mouth daily.     . Cholecalciferol (VITAMIN D) 2000 UNITS CAPS Take 2,000 Units by mouth daily.     Mariane Baumgarten Sodium (COLACE PO) Take by mouth.    . fexofenadine-pseudoephedrine (ALLEGRA-D 24) 180-240 MG per 24 hr tablet Take 1 tablet by mouth daily.    . metoprolol succinate (TOPROL-XL)  50 MG 24 hr tablet TAKE 1 TABLET BY MOUTH DAILY 90 tablet 0  . Multiple Vitamins-Minerals (MULTIVITAMIN PO) Take 1 tablet by mouth daily.     . Omega-3 Fatty Acids (FISH OIL PO) Take 900 mg by mouth daily. Taking 900 daily    . Red Yeast Rice 600 MG CAPS Take 1,200 mg by mouth daily.     Marland Kitchen SALINE NASAL SPRAY NA Place 1 spray into the nose daily as needed (allergies).     . SYNTHROID 150 MCG tablet TAKE 1 TABLET BY MOUTH EVERY DAY 90 tablet 0  . vitamin B-12 (CYANOCOBALAMIN) 1000 MCG tablet Take 1,000 mcg by mouth daily.    Marland Kitchen losartan (COZAAR) 25 MG tablet Take 1 tablet (25 mg total) by mouth daily. 90 tablet 3   No current facility-administered medications for this visit.     Allergies:   Patient has no known  allergies.    Social History:  The patient  reports that he has quit smoking. His smoking use included cigarettes. He has never used smokeless tobacco. He reports that he drinks alcohol. He reports that he does not use drugs.   Family History:  The patient's family history includes Breast cancer in his mother; Colon cancer in his father; Diabetes in his father; Heart attack in his father; Heart disease in his father.    ROS:  Please see the history of present illness.   Otherwise, review of systems are positive for none.   All other systems are reviewed and negative.    PHYSICAL EXAM: VS:  BP (!) 158/76   Pulse 66   Ht 5' 10.5" (1.791 m)   Wt 184 lb (83.5 kg)   BMI 26.03 kg/m  , BMI Body mass index is 26.03 kg/m. GEN: Well nourished, well developed, in no acute distress  HEENT: normal  Neck: no JVD, carotid bruits, or masses Cardiac: RRR; 3/6 systolic murmur. no, rubs, or gallops,no edema  Respiratory:  clear to auscultation bilaterally, normal work of breathing GI: soft, nontender, nondistended, + BS MS: no deformity or atrophy  Skin: warm and dry, no rash Neuro:  Strength and sensation are intact Psych: euthymic mood, full affect   EKG:  EKG is ordered today. The ekg ordered today demonstrates normal sinus rhythm at 81 bpm.  Occasional PVCs.  Incomplete right bundle branch block.  Nonspecific ST-T wave changes.   Recent Labs: 12/03/2017: ALT 13; BUN 15; Creatinine, Ser 0.99; Hemoglobin 13.9; Platelets 214.0; Potassium 4.0; Sodium 140; TSH 4.19    Lipid Panel    Component Value Date/Time   CHOL 154 12/03/2017 0845   CHOL 155 09/18/2016 0826   TRIG 63.0 12/03/2017 0845   HDL 45.10 12/03/2017 0845   HDL 41 09/18/2016 0826   CHOLHDL 3 12/03/2017 0845   VLDL 12.6 12/03/2017 0845   LDLCALC 96 12/03/2017 0845   LDLCALC 100 (H) 09/18/2016 0826   Wt Readings from Last 3 Encounters:  12/13/17 184 lb (83.5 kg)  12/03/17 184 lb (83.5 kg)  06/07/17 181 lb 6.4 oz (82.3 kg)      EKG performed today 07/03/2015 shows normal sinus rhythm rightward axis possible prior anterior MI age indeterminate there is no change from prior EKG on 01/11/2015.  TTE: 12/2015 - Left ventricle: The cavity size was normal. Systolic function was   vigorous. The estimated ejection fraction was in the range of 65%   to 70%. Wall motion was normal; there were no regional wall   motion abnormalities. Doppler parameters  are consistent with   abnormal left ventricular relaxation (grade 1 diastolic   dysfunction). There was no evidence of elevated ventricular   filling pressure by Doppler parameters. - Aortic valve: Valve mobility was restricted. There was mild   stenosis. There was no regurgitation. - Aortic root: The aortic root was normal in size. - Ascending aorta: The ascending aorta was normal in size. - Mitral valve: There was trivial regurgitation. - Left atrium: The atrium was normal in size. - Right ventricle: Systolic function was normal. - Right atrium: The atrium was normal in size. - Tricuspid valve: There was trivial regurgitation. - Pulmonary arteries: Systolic pressure was at the upper limits of   normal. PA peak pressure: 31 mm Hg (S). - Inferior vena cava: The vessel was normal in size. - Pericardium, extracardiac: There was no pericardial effusion.  EKG performed today 06/07/2017 - SR, LBBB, PACs, PVCs, unchanged from prior.   ASSESSMENT AND PLAN:  1.Mitral valve prolapse - no mitral valve prolapse and only trivial mitral regurgitation on the most recent echo in 12/2015  2. Hypercholesterolemia -well-controlled, LDL 96, HDL 45, triglycerides 63.  Continue red yeast rice and fish oil.  3. History of hemachromatosis followed by Dr.Ennever.  He has not had to have any recent phlebotomies, he usually goes every 5 years. LVEF 65-70% on echo in 2017, no signs of cardiac involvement.   4.Left bundle branch block -chronic.  5. Essential hypertension, elevated, I  will add losartan 25 mg daily to his regimen, we will follow-up in our blood pressure clinic in 3 weeks with recheck of BMP.  Current medicines are reviewed at length with the patient today.  The patient does not have concerns regarding medicines.  Orders Placed This Encounter  Procedures  . Basic Metabolic Panel (BMET)  . EKG 12-Lead    follow-up in 1 year.   Romeo Rabon   12/13/2017 9:14 AM    [facility] [address], [place], [place]  [address] Phone: [phone]; Fax: [phone]

## 2017-12-20 ENCOUNTER — Other Ambulatory Visit: Payer: PPO

## 2017-12-22 ENCOUNTER — Ambulatory Visit: Payer: PPO | Admitting: Endocrinology

## 2017-12-22 ENCOUNTER — Encounter: Payer: Self-pay | Admitting: Endocrinology

## 2017-12-22 VITALS — BP 142/86 | HR 62 | Ht 70.5 in | Wt 186.6 lb

## 2017-12-22 DIAGNOSIS — E890 Postprocedural hypothyroidism: Secondary | ICD-10-CM

## 2017-12-22 NOTE — Progress Notes (Signed)
Reason for Appointment:  Hypothyroidism, followup visit    History of Present Illness:   The hypothyroidism was first diagnosed in 2007 after radioactive iodine treatment for Graves' disease   The patient has been treated with  Levoxyl, generic levothyroxine and brand name Synthroid  His last dose change was in 11/15 and at that time his TSH was slightly increased at 4.7 and his levothyroxine dose was increased to 150 g He thinks he may have felt a little less fatigue with this change   Continues to be getting  brand name Synthroid from [facility] Does not complain of of any new fatigue  Weight is the same as last year      The patient is taking the thyroid supplement consistently in the morning before breakfast.   He is taking calcium at night and also takes vitamins including biotin separately  TSH is although normal relatively higher than before  Lab Results  Component Value Date   TSH 4.19 12/03/2017   TSH 2.64 12/21/2016   TSH 3.58 12/23/2015   FREET4 1.11 12/03/2017   FREET4 1.23 12/21/2016   FREET4 1.20 12/23/2015    Wt Readings from Last 3 Encounters:  12/22/17 186 lb 9.6 oz (84.6 kg)  12/13/17 184 lb (83.5 kg)  12/03/17 184 lb (83.5 kg)    Allergies as of 12/22/2017   No Known Allergies     Medication List        Accurate as of 12/22/17  8:48 AM. Always use your most recent med list.          aspirin 81 MG tablet Take 81 mg by mouth daily.   CALCIUM + D PO Take 1 tablet by mouth daily.   COLACE PO Take by mouth.   fexofenadine-pseudoephedrine 180-240 MG 24 hr tablet Commonly known as:  ALLEGRA-D 24 Take 1 tablet by mouth daily.   FISH OIL PO Take 900 mg by mouth daily. Taking 900 daily   losartan 25 MG tablet Commonly known as:  COZAAR Take 1 tablet (25 mg total) by mouth daily.   metoprolol succinate 50 MG 24 hr tablet Commonly known as:  TOPROL-XL TAKE 1 TABLET BY MOUTH DAILY   MULTIVITAMIN PO Take 1 tablet by mouth  daily.   Red Yeast Rice 600 MG Caps Take 1,200 mg by mouth daily.   SALINE NASAL SPRAY NA Place 1 spray into the nose daily as needed (allergies).   SYNTHROID 150 MCG tablet Generic drug:  levothyroxine TAKE 1 TABLET BY MOUTH EVERY DAY   vitamin B-12 1000 MCG tablet Commonly known as:  CYANOCOBALAMIN Take 1,000 mcg by mouth daily.   vitamin C 1000 MG tablet Take 1,000 mg by mouth daily.   Vitamin D 50 MCG (2000 UT) Caps Take 2,000 Units by mouth daily.       Allergies: No Known Allergies  Past Medical History:  Diagnosis Date  . Allergy    SEASONAL  . Cataract   . Colon polyps    Adenomatous  . Diverticulitis of colon with perforation 2010   Colectomy/ostomy  . Diverticulosis    hx of  . Hemochromatosis    hx of  . History of BPH   . History of COPD   . Hyperlipidemia   . Hypothyroidism   . Incisional hernia   . MVP (mitral valve prolapse)   . Thyroid disease     Past Surgical History:  Procedure Laterality Date  . COLONOSCOPY    . COLONOSCOPY W/ BIOPSIES  2011   Dr Henrene Pastor  . COLOSTOMY TAKEDOWN  2010   Dr Barry Dienes  . INGUINAL HERNIA REPAIR Left 1999   Dr. Rebekah Chesterfield  . INSERTION OF MESH N/A 10/13/2013   Procedure: INSERTION OF MESH;  Surgeon: Michael Boston, MD;  Location: [facility];  Service: General;  Laterality: N/A;  . LAPAROSCOPIC LYSIS OF ADHESIONS N/A 10/13/2013   Procedure: LAPAROSCOPIC LYSIS OF ADHESIONS;  Surgeon: Michael Boston, MD;  Location: [facility];  Service: General;  Laterality: N/A;  . PARTIAL COLECTOMY  08/12/2008   [facility]; perforated colon from diverticulitis  . UPPER GI ENDOSCOPY    . VENTRAL HERNIA REPAIR N/A 10/13/2013   Procedure: REPAIR OF VENTRAL WALL HERNIA WITH UNILATERAL COMPARTMENT SEPARATION;  Surgeon: Michael Boston, MD;  Location: [facility];  Service: General;  Laterality: N/A;    Family History  Problem Relation Age of Onset  . Breast cancer Mother   . Colon cancer Father   . Heart disease Father   . Diabetes Father   . Heart attack  Father   . Stroke Neg Hx     Social History:  reports that he has quit smoking. His smoking use included cigarettes. He has never used smokeless tobacco. He reports that he drinks alcohol. He reports that he does not use drugs.   ROS  Hypertension followed by other physicians  BP Readings from Last 3 Encounters:  12/22/17 (!) 142/86  12/13/17 (!) 158/76  12/03/17 (!) 146/82     Examination:   BP (!) 142/86 (BP Location: Left Arm, Patient Position: Sitting, Cuff Size: Normal)   Pulse 62   Ht 5' 10.5" (1.791 m)   Wt 186 lb 9.6 oz (84.6 kg)   SpO2 92%   BMI 26.40 kg/m   Looks well Thyroid exam is normal  biceps reflexes are difficult to elicit but triceps slightly brisk on the left    Assessment   Hypothyroidism, post ablative treated with brand name Synthroid 150 g once a day  He is doing subjectively well looks euthyroid  TSH still normal but at the higher end of the range This is not related to his forgetting his medication lately Considering his age and asymptomatic status will not need to increase the dose as yet     Treatment: Follow-up in 6 months on the same dose  Given information on Synthroid direct program and he will let us know if he wants to sign off   To call if he has any unusual fatigue  Elayne Snare 12/22/2017, 8:48 AM

## 2017-12-27 ENCOUNTER — Other Ambulatory Visit: Payer: Self-pay

## 2017-12-27 ENCOUNTER — Inpatient Hospital Stay (HOSPITAL_BASED_OUTPATIENT_CLINIC_OR_DEPARTMENT_OTHER): Payer: PPO | Admitting: Hematology & Oncology

## 2017-12-27 ENCOUNTER — Encounter: Payer: Self-pay | Admitting: Hematology & Oncology

## 2017-12-27 ENCOUNTER — Inpatient Hospital Stay: Payer: PPO | Attending: Hematology & Oncology

## 2017-12-27 ENCOUNTER — Telehealth: Payer: Self-pay | Admitting: *Deleted

## 2017-12-27 LAB — IRON AND TIBC
Iron: 171 ug/dL — ABNORMAL HIGH (ref 42–163)
SATURATION RATIOS: 72 % — AB (ref 20–55)
TIBC: 236 ug/dL (ref 202–409)
UIBC: 65 ug/dL — ABNORMAL LOW (ref 117–376)

## 2017-12-27 LAB — CBC WITH DIFFERENTIAL (CANCER CENTER ONLY)
Abs Immature Granulocytes: 0.02 10*3/uL (ref 0.00–0.07)
BASOS ABS: 0 10*3/uL (ref 0.0–0.1)
Basophils Relative: 1 %
EOS ABS: 0.2 10*3/uL (ref 0.0–0.5)
Eosinophils Relative: 3 %
HEMATOCRIT: 42.4 % (ref 39.0–52.0)
HEMOGLOBIN: 13.7 g/dL (ref 13.0–17.0)
IMMATURE GRANULOCYTES: 0 %
LYMPHS ABS: 0.9 10*3/uL (ref 0.7–4.0)
LYMPHS PCT: 12 %
MCH: 31.4 pg (ref 26.0–34.0)
MCHC: 32.3 g/dL (ref 30.0–36.0)
MCV: 97.2 fL (ref 80.0–100.0)
Monocytes Absolute: 0.7 10*3/uL (ref 0.1–1.0)
Monocytes Relative: 9 %
NEUTROS PCT: 75 %
NRBC: 0 % (ref 0.0–0.2)
Neutro Abs: 5.3 10*3/uL (ref 1.7–7.7)
Platelet Count: 213 10*3/uL (ref 150–400)
RBC: 4.36 MIL/uL (ref 4.22–5.81)
RDW: 13.3 % (ref 11.5–15.5)
WBC Count: 7 10*3/uL (ref 4.0–10.5)

## 2017-12-27 LAB — CMP (CANCER CENTER ONLY)
ALBUMIN: 4.2 g/dL (ref 3.5–5.0)
ALK PHOS: 60 U/L (ref 38–126)
ALT: 12 U/L (ref 0–44)
AST: 18 U/L (ref 15–41)
Anion gap: 6 (ref 5–15)
BILIRUBIN TOTAL: 0.9 mg/dL (ref 0.3–1.2)
BUN: 13 mg/dL (ref 8–23)
CALCIUM: 9.5 mg/dL (ref 8.9–10.3)
CO2: 30 mmol/L (ref 22–32)
CREATININE: 0.98 mg/dL (ref 0.61–1.24)
Chloride: 103 mmol/L (ref 98–111)
GFR, Est AFR Am: >60 mL/min (ref >60)
GLUCOSE: 158 mg/dL — AB (ref 70–99)
POTASSIUM: 4.4 mmol/L (ref 3.5–5.1)
Sodium: 139 mmol/L (ref 135–145)
TOTAL PROTEIN: 7.3 g/dL (ref 6.5–8.1)

## 2017-12-27 LAB — FERRITIN: FERRITIN: 21 ng/mL — AB (ref 24–336)

## 2017-12-27 NOTE — Progress Notes (Signed)
Hematology and Oncology Follow Up Visit  Preston Reeves [id number] [birth year] 77 y.o. 12/27/2017   Principle Diagnosis:   Hemochromatosis  Current Therapy:    Phlebotomy to maintain ferritin less than 100     Interim History:  Mr. Preston Reeves is back for follow-up.  So far, everything is going quite well for him.  We saw him a year ago.  Since we see him once a year, is always nice to catch up to see how he is doing.  Of note, his family doctor went ahead and did follow-up labs about a month or so ago.  His ferritin was only 27 with iron saturation of 36%.  He still is not donating his blood to the [employer].  His wife will not let him.  He has had no problems with fever.  He has had no problems with infections.  He has had a good appetite.  He had a wonderful Thanksgiving.  53 family members came over.  Of note, he is going to be a great grandfather soon.  Overall, his performance status is ECOG 0   Medications:  Current Outpatient Medications:  .  Ascorbic Acid (VITAMIN C) 1000 MG tablet, Take 1,000 mg by mouth daily.  , Disp: , Rfl:  .  aspirin 81 MG tablet, Take 81 mg by mouth daily. , Disp: , Rfl:  .  Calcium Carbonate-Vitamin D (CALCIUM + D PO), Take 1 tablet by mouth daily. , Disp: , Rfl:  .  Cholecalciferol (VITAMIN D) 2000 UNITS CAPS, Take 2,000 Units by mouth daily. , Disp: , Rfl:  .  Docusate Sodium (COLACE PO), Take by mouth., Disp: , Rfl:  .  fexofenadine-pseudoephedrine (ALLEGRA-D 24) 180-240 MG per 24 hr tablet, Take 1 tablet by mouth daily., Disp: , Rfl:  .  losartan (COZAAR) 25 MG tablet, Take 1 tablet (25 mg total) by mouth daily., Disp: 90 tablet, Rfl: 3 .  metoprolol succinate (TOPROL-XL) 50 MG 24 hr tablet, TAKE 1 TABLET BY MOUTH DAILY, Disp: 90 tablet, Rfl: 0 .  Multiple Vitamins-Minerals (MULTIVITAMIN PO), Take 1 tablet by mouth daily. , Disp: , Rfl:  .  Omega-3 Fatty Acids (FISH OIL PO), Take 900 mg by mouth daily. Taking 900 daily, Disp: , Rfl:  .  Red Yeast  Rice 600 MG CAPS, Take 1,200 mg by mouth daily. , Disp: , Rfl:  .  SALINE NASAL SPRAY NA, Place 1 spray into the nose daily as needed (allergies). , Disp: , Rfl:  .  SYNTHROID 150 MCG tablet, TAKE 1 TABLET BY MOUTH EVERY DAY, Disp: 90 tablet, Rfl: 0 .  vitamin B-12 (CYANOCOBALAMIN) 1000 MCG tablet, Take 1,000 mcg by mouth daily., Disp: , Rfl:   Allergies: No Known Allergies  Past Medical History, Surgical history, Social history, and Family History were reviewed and updated.  Review of Systems: Review of Systems  Constitutional: Negative.   HENT: Negative.   Eyes: Negative.   Respiratory: Negative.   Cardiovascular: Negative.   Gastrointestinal: Negative.   Genitourinary: Negative.   Musculoskeletal: Negative.   Skin: Negative.   Neurological: Negative.   Endo/Heme/Allergies: Negative.   Psychiatric/Behavioral: Negative.      Physical Exam:  weight is 187 lb (84.8 kg). His oral temperature is 98.6 F (37 C). His blood pressure is 148/68 (abnormal) and his pulse is 75. His respiration is 18 and oxygen saturation is 94%.   Wt Readings from Last 3 Encounters:  12/27/17 187 lb (84.8 kg)  12/22/17 186 lb 9.6 oz (  84.6 kg)  12/13/17 184 lb (83.5 kg)     Physical Exam  Constitutional: He is oriented to person, place, and time.  HENT:  Head: Normocephalic and atraumatic.  Mouth/Throat: Oropharynx is clear and moist.  Eyes: Pupils are equal, round, and reactive to light. EOM are normal.  Neck: Normal range of motion.  Cardiovascular: Normal rate, regular rhythm and normal heart sounds.  Pulmonary/Chest: Effort normal and breath sounds normal.  Abdominal: Soft. Bowel sounds are normal.  Musculoskeletal: Normal range of motion. He exhibits no edema, tenderness or deformity.  Lymphadenopathy:    He has no cervical adenopathy.  Neurological: He is alert and oriented to person, place, and time.  Skin: Skin is warm and dry. No rash noted. No erythema.  Psychiatric: He has a normal  mood and affect. His behavior is normal. Judgment and thought content normal.  Vitals reviewed.    Lab Results  Component Value Date   WBC 7.0 12/27/2017   HGB 13.7 12/27/2017   HCT 42.4 12/27/2017   MCV 97.2 12/27/2017   PLT 213 12/27/2017     Chemistry      Component Value Date/Time   NA 139 12/27/2017 1005   NA 139 09/18/2016 0826   NA 138 12/30/2015 1307   K 4.4 12/27/2017 1005   K 4.1 12/30/2015 1307   CL 103 12/27/2017 1005   CO2 30 12/27/2017 1005   CO2 24 12/30/2015 1307   BUN 13 12/27/2017 1005   BUN 17 09/18/2016 0826   BUN 17.4 12/30/2015 1307   CREATININE 0.98 12/27/2017 1005   CREATININE 0.9 12/30/2015 1307      Component Value Date/Time   CALCIUM 9.5 12/27/2017 1005   CALCIUM 9.6 12/30/2015 1307   ALKPHOS 60 12/27/2017 1005   ALKPHOS 69 12/30/2015 1307   AST 18 12/27/2017 1005   AST 23 12/30/2015 1307   ALT 12 12/27/2017 1005   ALT 15 12/30/2015 1307   BILITOT 0.9 12/27/2017 1005   BILITOT 0.75 12/30/2015 1307         Impression and Plan: Mr. Preston Reeves is a 77 year old gentleman with hemochromatosis.  Everything is going quite well for him.  I am just very happy that he is doing well.  He does not need to be phlebotomized.  We will go ahead and plan to get him back in another year.  He likes to come back to see [place] and just allow [place] to stay on top of his iron.  [name], MD 12/2/201911:17 AM

## 2017-12-27 NOTE — Telephone Encounter (Signed)
As noted below by Dr. Marin Olp, I informed the patient that his iron level is normal and he doesn't need a phlebotomy. He verbalized understanding.

## 2017-12-27 NOTE — Telephone Encounter (Signed)
-----   Message from [name], MD sent at 12/27/2017  1:56 PM EST ----- Call - iron level is ok!!  NO phlebootmy!!  Preston Reeves

## 2017-12-28 ENCOUNTER — Ambulatory Visit: Payer: PPO | Admitting: Hematology & Oncology

## 2017-12-28 ENCOUNTER — Other Ambulatory Visit: Payer: PPO

## 2017-12-31 DIAGNOSIS — I10 Essential (primary) hypertension: Secondary | ICD-10-CM | POA: Diagnosis not present

## 2017-12-31 DIAGNOSIS — J069 Acute upper respiratory infection, unspecified: Secondary | ICD-10-CM | POA: Diagnosis not present

## 2017-12-31 DIAGNOSIS — J019 Acute sinusitis, unspecified: Secondary | ICD-10-CM | POA: Diagnosis not present

## 2018-01-05 ENCOUNTER — Ambulatory Visit: Payer: PPO

## 2018-01-05 ENCOUNTER — Other Ambulatory Visit: Payer: PPO

## 2018-01-07 DIAGNOSIS — R972 Elevated prostate specific antigen [PSA]: Secondary | ICD-10-CM | POA: Diagnosis not present

## 2018-01-10 DIAGNOSIS — H40013 Open angle with borderline findings, low risk, bilateral: Secondary | ICD-10-CM | POA: Diagnosis not present

## 2018-01-12 ENCOUNTER — Ambulatory Visit (INDEPENDENT_AMBULATORY_CARE_PROVIDER_SITE_OTHER): Payer: PPO | Admitting: Pharmacist

## 2018-01-12 ENCOUNTER — Other Ambulatory Visit: Payer: PPO

## 2018-01-12 VITALS — BP 144/86 | HR 70

## 2018-01-12 DIAGNOSIS — I10 Essential (primary) hypertension: Secondary | ICD-10-CM | POA: Diagnosis not present

## 2018-01-12 MED ORDER — LOSARTAN POTASSIUM 50 MG PO TABS: 50.0000 mg | ORAL_TABLET | Freq: Every day | ORAL | 3 refills | Status: DC

## 2018-01-12 NOTE — Patient Instructions (Addendum)
Increase losartan to 50mg  daily (can take 2 tablets of 25mg  daily until you run out) Check blood pressure at home and record your readings Please bring these readings and your blood pressure machine with you to your clinic appointment Call us at [phone] with any questions

## 2018-01-12 NOTE — Progress Notes (Signed)
Patient ID: [name]                 DOB: October 25, 1940                      MRN: [id number]     HPI: Preston Reeves is a 77 y.o. male referred by Dr. Meda Coffee to HTN clinic. PMH is significant for mitral valve prolapse, hypertension, hyperlipidemia, hyrotoxicosis and hemochromatosis, hypothyrodism s/p radioiodine for graves disease. Patient saw Dr. Meda Coffee on 11/18 and was started on losartan 25mg  daily. Labs were drawn on 12/2 by oncology and BMP stable.   Patient denies, dizziness, lightheadness, falls, headache, blurred vision. Has been tolerating new medication well. Patient took his BP medication this AM and had his AM coffee.   Current HTN meds: losartan 25mg  daily, metoprolol succinate 50mg  daily Previously tried: none BP goal: <130/80  Family History: The patient's family history includes Breast cancer in his mother; Colon cancer in his father; Diabetes in his father; Heart attack in his father; Heart disease in his father.   Social History: The patient reports that he has quit smoking. His smoking use included cigarettes. He has never used smokeless tobacco. He reports that he drinks alcohol -one beer a night. He reports that he does not use drugs.   Diet: coffee 2 cups a day, does use ketchup, rarely puts salt on his food. Wife cooks with a little salt  Exercise: golf x 2 days a week  Home BP readings: Home readings have been in the 140's. Checks only occassionally.  Wt Readings from Last 3 Encounters:  12/27/17 187 lb (84.8 kg)  12/22/17 186 lb 9.6 oz (84.6 kg)  12/13/17 184 lb (83.5 kg)   BP Readings from Last 3 Encounters:  01/12/18 (!) 144/86  12/27/17 (!) 148/68  12/22/17 (!) 142/86   Pulse Readings from Last 3 Encounters:  01/12/18 70  12/27/17 75  12/22/17 62    Renal function: CrCl cannot be calculated (Unknown ideal weight.).  Past Medical History:  Diagnosis Date  . Allergy    SEASONAL  . Cataract   . Colon polyps    Adenomatous  . Diverticulitis of  colon with perforation 2010   Colectomy/ostomy  . Diverticulosis    hx of  . Hemochromatosis    hx of  . History of BPH   . History of COPD   . Hyperlipidemia   . Hypothyroidism   . Incisional hernia   . MVP (mitral valve prolapse)   . Thyroid disease     Current Outpatient Medications on File Prior to Visit  Medication Sig Dispense Refill  . Ascorbic Acid (VITAMIN C) 1000 MG tablet Take 1,000 mg by mouth daily.      Marland Kitchen aspirin 81 MG tablet Take 81 mg by mouth daily.     . Calcium Carbonate-Vitamin D (CALCIUM + D PO) Take 1 tablet by mouth daily.     . Cholecalciferol (VITAMIN D) 2000 UNITS CAPS Take 2,000 Units by mouth daily.     Mariane Baumgarten Sodium (COLACE PO) Take by mouth.    . fexofenadine-pseudoephedrine (ALLEGRA-D 24) 180-240 MG per 24 hr tablet Take 1 tablet by mouth daily.    . metoprolol succinate (TOPROL-XL) 50 MG 24 hr tablet TAKE 1 TABLET BY MOUTH DAILY 90 tablet 0  . Multiple Vitamins-Minerals (MULTIVITAMIN PO) Take 1 tablet by mouth daily.     . Omega-3 Fatty Acids (FISH OIL PO) Take 900 mg by mouth  daily. Taking 900 daily    . Red Yeast Rice 600 MG CAPS Take 1,200 mg by mouth daily.     Marland Kitchen SALINE NASAL SPRAY NA Place 1 spray into the nose daily as needed (allergies).     . SYNTHROID 150 MCG tablet TAKE 1 TABLET BY MOUTH EVERY DAY 90 tablet 0  . vitamin B-12 (CYANOCOBALAMIN) 1000 MCG tablet Take 1,000 mcg by mouth daily.     No current facility-administered medications on file prior to visit.     No Known Allergies  Blood pressure (!) 144/86, pulse 70, SpO2 98 %.   Assessment/Plan:  1. Hypertension - BP still above goal of <130/80. Will increase losartan to 50mg  daily.  Discussed with patient increasing the amount of days he exercises. Advised adding one day a week of walking (~62min). Start slow and increase as tolerated to a goal of ~30 min a day 4-5 times a week. Recheck BP and BMET in about 2 weeks prior to pt leaving for [place] for 3 months on 1/4. Pt asked  to check BP at home and bring cuff and log with him to next appointment.    Thank you  Ramond Dial, Pharm.D, BCPS [facility]

## 2018-01-13 ENCOUNTER — Other Ambulatory Visit: Payer: Self-pay | Admitting: Endocrinology

## 2018-01-14 DIAGNOSIS — R972 Elevated prostate specific antigen [PSA]: Secondary | ICD-10-CM | POA: Diagnosis not present

## 2018-01-27 ENCOUNTER — Ambulatory Visit (INDEPENDENT_AMBULATORY_CARE_PROVIDER_SITE_OTHER): Payer: PPO | Admitting: Pharmacist

## 2018-01-27 ENCOUNTER — Other Ambulatory Visit: Payer: PPO | Admitting: *Deleted

## 2018-01-27 ENCOUNTER — Encounter: Payer: Self-pay | Admitting: Pharmacist

## 2018-01-27 VITALS — BP 120/70 | HR 59

## 2018-01-27 DIAGNOSIS — I10 Essential (primary) hypertension: Secondary | ICD-10-CM

## 2018-01-27 LAB — BASIC METABOLIC PANEL
BUN/Creatinine Ratio: 16 (ref 10–24)
BUN: 16 mg/dL (ref 8–27)
CO2: 24 mmol/L (ref 20–29)
Calcium: 10 mg/dL (ref 8.6–10.2)
Chloride: 101 mmol/L (ref 96–106)
Creatinine, Ser: 0.99 mg/dL (ref 0.76–1.27)
GFR calc Af Amer: 85 mL/min/{1.73_m2} (ref >59)
GFR calc non Af Amer: 73 mL/min/{1.73_m2} (ref >59)
Glucose: 115 mg/dL — ABNORMAL HIGH (ref 65–99)
Potassium: 4.2 mmol/L (ref 3.5–5.2)
Sodium: 139 mmol/L (ref 134–144)

## 2018-01-27 MED ORDER — LOSARTAN POTASSIUM 50 MG PO TABS: 50.0000 mg | ORAL_TABLET | Freq: Two times a day (BID) | ORAL | 3 refills | Status: DC

## 2018-01-27 NOTE — Progress Notes (Signed)
Patient ID: Preston Reeves                 DOB: [birth year]                      MRN: [id number]     HPI: Preston Reeves is a 78 y.o. male referred by Dr. Meda Reeves to HTN clinic. PMH is significant for mitral valve prolapse, hypertension, hyperlipidemia, hyrotoxicosis and hemochromatosis, hypothyrodism s/p radioiodine for graves disease.   At last visit 12/18, patient BP was still above goal at 144/86, therefore losartan was increased to 50mg  daily. Patient was also encouraged to increase amount of exercise and to check his blood pressure at home. Patient is due for a repeat BMP today.   Patient denies any dizziness, lightheadness, falls, headache, blurred vision.  Home BP cuff reading in clinic 162/82 HR 80 (left arm) compared to clinic reading in same arm 140/78   Current HTN meds: losartan 50mg  daily, metoprolol succinate 50mg  daily Previously tried: none BP goal: <130/80  Family History: The patient's family history includes Breast cancer in his mother; Colon cancer in his father; Diabetes in his father; Heart attack in his father; Heart disease in his father.   Social History: The patient reports that he has quit smoking. His smoking use included cigarettes. He has never used smokeless tobacco. He reports that he drinks alcohol -one beer a night. He reports that he does not use drugs.   Diet: Reeves 2 cups a day, does use ketchup, rarely puts salt on his food. Wife cooks with a little salt  Exercise: golf x 2 days a week  Home BP readings: 133/76, 123/65, 150/77, 114/59, 142/75, 139/73, 141/78, 143/71, 143/78, 130/55, 159/76, 115/68, 131/72, 146/79, 147/76 HR 60's  Wt Readings from Last 3 Encounters:  12/27/17 187 lb (84.8 kg)  12/22/17 186 lb 9.6 oz (84.6 kg)  12/13/17 184 lb (83.5 kg)   BP Readings from Last 3 Encounters:  01/27/18 120/70  01/12/18 (!) 144/86  12/27/17 (!) 148/68   Pulse Readings from Last 3 Encounters:  01/27/18 (!) 59  01/12/18 70  12/27/17 75    Renal  function: CrCl cannot be calculated (Patient's most recent lab result is older than the maximum 21 days allowed.).  Past Medical History:  Diagnosis Date  . Allergy    SEASONAL  . Cataract   . Colon polyps    Adenomatous  . Diverticulitis of colon with perforation 2010   Colectomy/ostomy  . Diverticulosis    hx of  . Hemochromatosis    hx of  . History of BPH   . History of COPD   . Hyperlipidemia   . Hypothyroidism   . Incisional hernia   . MVP (mitral valve prolapse)   . Thyroid disease     Current Outpatient Medications on File Prior to Visit  Medication Sig Dispense Refill  . Ascorbic Acid (VITAMIN C) 1000 MG tablet Take 1,000 mg by mouth daily.      Marland Kitchen aspirin 81 MG tablet Take 81 mg by mouth daily.     . Calcium Carbonate-Vitamin D (CALCIUM + D PO) Take 1 tablet by mouth daily.     . Cholecalciferol (VITAMIN D) 2000 UNITS CAPS Take 2,000 Units by mouth daily.     Mariane Baumgarten Sodium (COLACE PO) Take by mouth.    . fexofenadine-pseudoephedrine (ALLEGRA-D 24) 180-240 MG per 24 hr tablet Take 1 tablet by mouth daily.    . metoprolol succinate (  TOPROL-XL) 50 MG 24 hr tablet TAKE 1 TABLET BY MOUTH DAILY 90 tablet 0  . Multiple Vitamins-Minerals (MULTIVITAMIN PO) Take 1 tablet by mouth daily.     . Omega-3 Fatty Acids (FISH OIL PO) Take 900 mg by mouth daily. Taking 900 daily    . Red Yeast Rice 600 MG CAPS Take 1,200 mg by mouth daily.     Marland Kitchen SALINE NASAL SPRAY NA Place 1 spray into the nose daily as needed (allergies).     . SYNTHROID 150 MCG tablet TAKE 1 TABLET BY MOUTH EVERY DAY 90 tablet 0  . vitamin B-12 (CYANOCOBALAMIN) 1000 MCG tablet Take 1,000 mcg by mouth daily.     No current facility-administered medications on file prior to visit.     No Known Allergies  Blood pressure 120/70, pulse (!) 59. 140/78 (left arm)   Assessment/Plan:  1. Hypertension - Patients blood pressure is not consistently at goal of <130/80. Highest readings at night. However, unsure of  the accuracy of his home meter as it read about 20 points higher systolic than clinic reading in same arm. Due to higher readings in the evening (potentially the AM before meds as well) will split losartan into 2 doses to see if this has any effect. I will increase dose to 50mg  BID pending results of BMET drawn this AM.  Patient is leaving tomorrow for [place] for 3 months. I have provided him an rx to get his BMET checked in 1 month. Patient was instructed to bring to any lab in [place]. Fax number provided for results. Patient was scheduled for follow up in clinic for when he returns.   Thank you  Ramond Dial, Pharm.D, [facility]  [address] [place], [place] [address]  Phone: [phone]; Fax: [phone]

## 2018-01-27 NOTE — Patient Instructions (Signed)
Start taking losartan 50mg  twice a day. You can continue to take 2 tablets of 25mg  twice a day until you run out.  Take your lab slip to any lab in [place] in about 1 month  Continue to check your blood pressure at home. Consider getting a new meter. The best brand is Omron.   If you experience any dizziness, lightheadedness please give [place] a call Call us with any questions or concerns [phone]

## 2018-03-04 ENCOUNTER — Other Ambulatory Visit: Payer: Self-pay | Admitting: Hematology & Oncology

## 2018-03-11 ENCOUNTER — Other Ambulatory Visit: Payer: Self-pay | Admitting: Cardiology

## 2018-03-11 DIAGNOSIS — Z7689 Persons encountering health services in other specified circumstances: Secondary | ICD-10-CM | POA: Diagnosis not present

## 2018-03-12 LAB — BASIC METABOLIC PANEL
BUN/Creatinine Ratio: 16 (ref 10–24)
BUN: 15 mg/dL (ref 8–27)
CO2: 25 mmol/L (ref 20–29)
Calcium: 9.6 mg/dL (ref 8.6–10.2)
Chloride: 101 mmol/L (ref 96–106)
Creatinine, Ser: 0.94 mg/dL (ref 0.76–1.27)
GFR calc Af Amer: 89 mL/min/{1.73_m2} (ref >59)
GFR calc non Af Amer: 77 mL/min/{1.73_m2} (ref >59)
Glucose: 108 mg/dL — ABNORMAL HIGH (ref 65–99)
Potassium: 4.5 mmol/L (ref 3.5–5.2)
Sodium: 141 mmol/L (ref 134–144)

## 2018-03-12 LAB — SPECIMEN STATUS REPORT

## 2018-05-06 ENCOUNTER — Other Ambulatory Visit: Payer: Self-pay | Admitting: Endocrinology

## 2018-05-10 ENCOUNTER — Telehealth: Payer: Self-pay | Admitting: Pharmacist

## 2018-05-10 NOTE — Telephone Encounter (Signed)
Called pt to cancel HTN follow up visit next Monday due to COVID-19. Pt reports BP readings have been excellent since his losartan was increased to 50mg  BID in January. Readings running 120s/70s at home and pt is without complaint. Advised him to continue to monitor BP readings at home and to call the office if he has any concerns. F/u appt not rescheduled at this time due to controlled readings.

## 2018-05-16 ENCOUNTER — Ambulatory Visit: Payer: PPO

## 2018-05-31 ENCOUNTER — Other Ambulatory Visit: Payer: Self-pay | Admitting: Hematology & Oncology

## 2018-06-22 ENCOUNTER — Other Ambulatory Visit: Payer: Self-pay

## 2018-06-22 ENCOUNTER — Other Ambulatory Visit (INDEPENDENT_AMBULATORY_CARE_PROVIDER_SITE_OTHER): Payer: PPO

## 2018-06-22 DIAGNOSIS — E890 Postprocedural hypothyroidism: Secondary | ICD-10-CM

## 2018-06-22 LAB — TSH: TSH: 3.45 u[IU]/mL (ref 0.35–4.50)

## 2018-06-22 LAB — T4, FREE: Free T4: 1.28 ng/dL (ref 0.60–1.60)

## 2018-06-24 ENCOUNTER — Ambulatory Visit (INDEPENDENT_AMBULATORY_CARE_PROVIDER_SITE_OTHER): Payer: PPO | Admitting: Endocrinology

## 2018-06-24 ENCOUNTER — Other Ambulatory Visit: Payer: Self-pay

## 2018-06-24 ENCOUNTER — Encounter: Payer: Self-pay | Admitting: Endocrinology

## 2018-06-24 DIAGNOSIS — E890 Postprocedural hypothyroidism: Secondary | ICD-10-CM | POA: Diagnosis not present

## 2018-06-24 NOTE — Progress Notes (Signed)
Reason for Appointment:  Hypothyroidism, followup visit   Today's office visit was provided via telemedicine using video technique Explained to the patient and the the limitations of evaluation and management by telemedicine and the availability of in person appointments.  The patient understood the limitations and agreed to proceed. Patient also understood that the telehealth visit is billable. . Location of the patient: Home . Location of the provider: Office Only the patient and myself were participating in the encounter     History of Present Illness:   The hypothyroidism was first diagnosed in 2007 after radioactive iodine treatment for Graves' disease   The patient has been treated with  Levoxyl, generic levothyroxine and more recently brand name Synthroid  His last dose change was in 11/15 and at that time his TSH was slightly increased at 4.7 and his dose was increased to 150 g He thinks he may have felt a little less fatigue with this change   Continues to be getting  brand name Synthroid from [facility]      The patient is taking the thyroid supplement consistently in the morning before breakfast.  He is taking calcium at night and also takes vitamins including biotin separately  He has no complaints of feeling tired, lethargic or any swelling He thinks his weight is 177 at home without clothes but not clear this is different than before In 11/19 his TSH was 4.2 but since he was feeling fairly good the dosage was not changed  TSH is now more normal at 3.45   Lab Results  Component Value Date   TSH 3.45 06/22/2018   TSH 4.19 12/03/2017   TSH 2.64 12/21/2016   FREET4 1.28 06/22/2018   FREET4 1.11 12/03/2017   FREET4 1.23 12/21/2016    Wt Readings from Last 3 Encounters:  12/27/17 187 lb (84.8 kg)  12/22/17 186 lb 9.6 oz (84.6 kg)  12/13/17 184 lb (83.5 kg)    Allergies as of 06/24/2018   No Known Allergies     Medication List       Accurate  as of Jun 24, 2018  8:41 AM. If you have any questions, ask your nurse or doctor.        aspirin 81 MG tablet Take 81 mg by mouth daily.   CALCIUM + D PO Take 1 tablet by mouth daily.   COLACE PO Take by mouth.   fexofenadine-pseudoephedrine 180-240 MG 24 hr tablet Commonly known as:  ALLEGRA-D 24 Take 1 tablet by mouth daily.   FISH OIL PO Take 900 mg by mouth daily. Taking 900 daily   losartan 50 MG tablet Commonly known as:  COZAAR Take 1 tablet (50 mg total) by mouth 2 (two) times daily.   metoprolol succinate 50 MG 24 hr tablet Commonly known as:  TOPROL-XL TAKE 1 TABLET BY MOUTH DAILY   MULTIVITAMIN PO Take 1 tablet by mouth daily.   Red Yeast Rice 600 MG Caps Take 1,200 mg by mouth daily.   SALINE NASAL SPRAY NA Place 1 spray into the nose daily as needed (allergies).   Synthroid 150 MCG tablet Generic drug:  levothyroxine TAKE 1 TABLET BY MOUTH EVERY DAY   vitamin B-12 1000 MCG tablet Commonly known as:  CYANOCOBALAMIN Take 1,000 mcg by mouth daily.   vitamin C 1000 MG tablet Take 1,000 mg by mouth daily.   Vitamin D 50 MCG (2000 UT) Caps Take 2,000 Units by mouth daily.       Allergies: No  Known Allergies  Past Medical History:  Diagnosis Date  . Allergy    SEASONAL  . Cataract   . Colon polyps    Adenomatous  . Diverticulitis of colon with perforation 2010   Colectomy/ostomy  . Diverticulosis    hx of  . Hemochromatosis    hx of  . History of BPH   . History of COPD   . Hyperlipidemia   . Hypothyroidism   . Incisional hernia   . MVP (mitral valve prolapse)   . Thyroid disease     Past Surgical History:  Procedure Laterality Date  . COLONOSCOPY    . COLONOSCOPY W/ BIOPSIES  2011   Dr Henrene Pastor  . COLOSTOMY TAKEDOWN  2010   Dr Barry Dienes  . INGUINAL HERNIA REPAIR Left 1999   Dr. Rebekah Chesterfield  . INSERTION OF MESH N/A 10/13/2013   Procedure: INSERTION OF MESH;  Surgeon: Michael Boston, MD;  Location: [facility];  Service: General;  Laterality:  N/A;  . LAPAROSCOPIC LYSIS OF ADHESIONS N/A 10/13/2013   Procedure: LAPAROSCOPIC LYSIS OF ADHESIONS;  Surgeon: Michael Boston, MD;  Location: [facility];  Service: General;  Laterality: N/A;  . PARTIAL COLECTOMY  08/12/2008   [facility]; perforated colon from diverticulitis  . UPPER GI ENDOSCOPY    . VENTRAL HERNIA REPAIR N/A 10/13/2013   Procedure: REPAIR OF VENTRAL WALL HERNIA WITH UNILATERAL COMPARTMENT SEPARATION;  Surgeon: Michael Boston, MD;  Location: [facility];  Service: General;  Laterality: N/A;    Family History  Problem Relation Age of Onset  . Breast cancer Mother   . Colon cancer Father   . Heart disease Father   . Diabetes Father   . Heart attack Father   . Stroke Neg Hx     Social History:  reports that he has quit smoking. His smoking use included cigarettes. He has never used smokeless tobacco. He reports current alcohol use. He reports that he does not use drugs.   ROS  Hypertension followed by other physicians  BP Readings from Last 3 Encounters:  01/27/18 120/70  01/12/18 (!) 144/86  12/27/17 (!) 148/68     Examination:   There were no vitals taken for this visit.      Assessment   Hypothyroidism, post ablative treated with brand name Synthroid 150 g daily  He has been doing fairly well He is very consistent with taking his Synthroid before breakfast daily Also wanting to continue brand-name Synthroid  TSH is 3.45 which is adequate especially considering his age and slightly better than 6 months ago     Treatment: Follow-up in 6 months on the same dose  She will be consider Synthroid direct program, currently does not want to join this since he sometimes stays in [place]  To call if he has any unusual symptoms or fatigue  Elayne Snare 06/24/2018, 8:41 AM

## 2018-07-11 DIAGNOSIS — H40013 Open angle with borderline findings, low risk, bilateral: Secondary | ICD-10-CM | POA: Diagnosis not present

## 2018-07-15 DIAGNOSIS — R972 Elevated prostate specific antigen [PSA]: Secondary | ICD-10-CM | POA: Diagnosis not present

## 2018-07-27 DIAGNOSIS — R972 Elevated prostate specific antigen [PSA]: Secondary | ICD-10-CM | POA: Diagnosis not present

## 2018-07-31 ENCOUNTER — Other Ambulatory Visit: Payer: Self-pay | Admitting: Endocrinology

## 2018-08-25 ENCOUNTER — Other Ambulatory Visit: Payer: Self-pay | Admitting: Hematology & Oncology

## 2018-08-29 ENCOUNTER — Encounter: Payer: Self-pay | Admitting: Internal Medicine

## 2018-09-02 ENCOUNTER — Encounter: Payer: Self-pay | Admitting: Internal Medicine

## 2018-09-26 ENCOUNTER — Other Ambulatory Visit: Payer: Self-pay

## 2018-09-26 ENCOUNTER — Ambulatory Visit (AMBULATORY_SURGERY_CENTER): Payer: Self-pay | Admitting: *Deleted

## 2018-09-26 VITALS — Temp 97.1°F | Ht 71.0 in | Wt 184.0 lb

## 2018-09-26 DIAGNOSIS — Z86010 Personal history of colonic polyps: Secondary | ICD-10-CM

## 2018-09-26 MED ORDER — PLENVU 140 G PO SOLR: 1.0000 | Freq: Once | ORAL | 0 refills | Status: AC

## 2018-09-26 NOTE — Progress Notes (Signed)
Patient is here in-person for PV. Patient denies any allergies to eggs or soy. Patient denies any problems with anesthesia/sedation. Patient denies any oxygen use at home. Patient denies taking any diet/weight loss medications or blood thinners. EMMI education assisgned to patient on colonoscopy, this was explained and instructions given to patient. [employer] medicare coupon given to pt. Pt is aware that care partner will wait in the car during procedure; if they feel like they will be too hot to wait in the car; they may wait in the lobby.  We want them to wear a mask (we do not have any that we can provide them), practice social distancing, and we will check their temperatures when they get here.  I did remind patient that their care partner needs to stay in the parking lot the entire time. Pt will wear mask into building.

## 2018-09-28 ENCOUNTER — Encounter: Payer: Self-pay | Admitting: Internal Medicine

## 2018-10-10 ENCOUNTER — Telehealth: Payer: Self-pay | Admitting: Internal Medicine

## 2018-10-10 NOTE — Telephone Encounter (Signed)
Returning call, answered "NO" to all screening questions.

## 2018-10-10 NOTE — Telephone Encounter (Signed)
Do you now or have you had a fever in the last 14 days?         Do you have any respiratory symptoms of shortness of breath or cough now or in the last 14 days?        Do you have any family members or close contacts with diagnosed or suspected Covid-19 in the past 14 days?         Have you been tested for Covid-19 and found to be positive?        Pt made aware to check in on theth floor and that care partner may wait in the car or come up to the lobby during the procedure but will need to provide their own mask.

## 2018-10-11 ENCOUNTER — Other Ambulatory Visit: Payer: Self-pay

## 2018-10-11 ENCOUNTER — Encounter: Payer: Self-pay | Admitting: Internal Medicine

## 2018-10-11 ENCOUNTER — Ambulatory Visit (AMBULATORY_SURGERY_CENTER): Payer: PPO | Admitting: Internal Medicine

## 2018-10-11 VITALS — BP 117/68 | HR 65 | Temp 97.3°F | Resp 22 | Ht 71.0 in | Wt 184.0 lb

## 2018-10-11 DIAGNOSIS — D122 Benign neoplasm of ascending colon: Secondary | ICD-10-CM

## 2018-10-11 DIAGNOSIS — Z800 Family history of malignant neoplasm of digestive organs: Secondary | ICD-10-CM | POA: Diagnosis not present

## 2018-10-11 DIAGNOSIS — Z86010 Personal history of colonic polyps: Secondary | ICD-10-CM | POA: Diagnosis not present

## 2018-10-11 DIAGNOSIS — D123 Benign neoplasm of transverse colon: Secondary | ICD-10-CM | POA: Diagnosis not present

## 2018-10-11 DIAGNOSIS — Z1211 Encounter for screening for malignant neoplasm of colon: Secondary | ICD-10-CM | POA: Diagnosis not present

## 2018-10-11 MED ORDER — SODIUM CHLORIDE 0.9 % IV SOLN: 500.0000 mL | Freq: Once | INTRAVENOUS | Status: DC

## 2018-10-11 NOTE — Progress Notes (Signed)
A and O x3. Report to RN. Tolerated MAC anesthesia well.

## 2018-10-11 NOTE — Op Note (Signed)
[facility] Patient Name: Preston Reeves Procedure Date: 10/11/2018 10:43 AM MRN: [id number] Endoscopist: Docia Chuck. Henrene Pastor , MD Age: 78 Referring MD:  Date of Birth: May 31, 1940 Gender: Male Account #: [id number] Procedure:                Colonoscopy with cold snare polypectomy x 2 Indications:              High risk colon cancer surveillance: Personal                            history of multiple (3 or more) adenomas. Previous                            examinations 2003, 2005, 2008, 2011, 2015. Father                            with colon cancer in 46s. Patient with prior                            history of sigmoid resection for complicated                            diverticular disease Medicines:                Monitored Anesthesia Care Procedure:                Pre-Anesthesia Assessment:                           - Prior to the procedure, a History and Physical                            was performed, and patient medications and                            allergies were reviewed. The patient's tolerance of                            previous anesthesia was also reviewed. The risks                            and benefits of the procedure and the sedation                            options and risks were discussed with the patient.                            All questions were answered, and informed consent                            was obtained. Prior Anticoagulants: The patient has                            taken no previous anticoagulant or antiplatelet  agents. ASA Grade Assessment: II - A patient with                            mild systemic disease. After reviewing the risks                            and benefits, the patient was deemed in                            satisfactory condition to undergo the procedure.                           After obtaining informed consent, the colonoscope                            was passed under direct  vision. Throughout the                            procedure, the patient's blood pressure, pulse, and                            oxygen saturations were monitored continuously. The                            Colonoscope was introduced through the anus and                            advanced to the the cecum, identified by                            appendiceal orifice and ileocecal valve. The                            ileocecal valve, appendiceal orifice, and rectum                            were photographed. The quality of the bowel                            preparation was excellent. The colonoscopy was                            performed without difficulty. The patient tolerated                            the procedure well. The bowel preparation used was                            SUPREP via split dose instruction. Scope In: 10:58:45 AM Scope Out: 11:12:13 AM Scope Withdrawal Time: 0 hours 9 minutes 55 seconds  Total Procedure Duration: 0 hours 13 minutes 28 seconds  Findings:                 Two polyps were found in the transverse colon and  ascending colon. The polyps were 1 to 3 mm in size.                            These polyps were removed with a cold snare.                            Resection and retrieval were complete.                           A few small-mouthed diverticula were found in the                            left colon. Prior surgical anastomosis in the                            sigmoid colon unremarkable                           The exam was otherwise without abnormality on                            direct and retroflexion views. General hemorrhoids                            present. Complications:            No immediate complications. Estimated blood loss:                            None. Estimated Blood Loss:     Estimated blood loss: none. Impression:               - Two 1 to 3 mm polyps in the transverse colon and                             in the ascending colon, removed with a cold snare.                            Resected and retrieved.                           - Diverticulosis in the left colon. Status post                            sigmoid colectomy                           - The examination was otherwise normal on direct                            and retroflexion views. Internal hemorrhoids. Recommendation:           - Repeat colonoscopy is not recommended for                            surveillance.                           -  Patient has a contact number available for                            emergencies. The signs and symptoms of potential                            delayed complications were discussed with the                            patient. Return to normal activities tomorrow.                            Written discharge instructions were provided to the                            patient.                           - Resume previous diet.                           - Continue present medications.                           - Await pathology results. Docia Chuck. Henrene Pastor, MD 10/11/2018 11:18:37 AM This report has been signed electronically.

## 2018-10-11 NOTE — Patient Instructions (Signed)
Please see handouts given to you on Polyps and Diverticulosis. Thank you for letting us take care of your healthcare needs today.   YOU HAD AN ENDOSCOPIC PROCEDURE TODAY AT [facility]:   Refer to the procedure report that was given to you for any specific questions about what was found during the examination.  If the procedure report does not answer your questions, please call your gastroenterologist to clarify.  If you requested that your care partner not be given the details of your procedure findings, then the procedure report has been included in a sealed envelope for you to review at your convenience later.  YOU SHOULD EXPECT: Some feelings of bloating in the abdomen. Passage of more gas than usual.  Walking can help get rid of the air that was put into your GI tract during the procedure and reduce the bloating. If you had a lower endoscopy (such as a colonoscopy or flexible sigmoidoscopy) you may notice spotting of blood in your stool or on the toilet paper. If you underwent a bowel prep for your procedure, you may not have a normal bowel movement for a few days.  Please Note:  You might notice some irritation and congestion in your nose or some drainage.  This is from the oxygen used during your procedure.  There is no need for concern and it should clear up in a day or so.  SYMPTOMS TO REPORT IMMEDIATELY:   Following lower endoscopy (colonoscopy or flexible sigmoidoscopy):  Excessive amounts of blood in the stool  Significant tenderness or worsening of abdominal pains  Swelling of the abdomen that is new, acute  Fever of 100F or higher   For urgent or emergent issues, a gastroenterologist can be reached at any hour by calling [phone].   DIET:  We do recommend a small meal at first, but then you may proceed to your regular diet.  Drink plenty of fluids but you should avoid alcoholic beverages for 24 hours.  ACTIVITY:  You should plan to take it easy for the  rest of today and you should NOT DRIVE or use heavy machinery until tomorrow (because of the sedation medicines used during the test).    FOLLOW UP: Our staff will call the number listed on your records 48-72 hours following your procedure to check on you and address any questions or concerns that you may have regarding the information given to you following your procedure. If we do not reach you, we will leave a message.  We will attempt to reach you two times.  During this call, we will ask if you have developed any symptoms of COVID 19. If you develop any symptoms (ie: fever, flu-like symptoms, shortness of breath, cough etc.) before then, please call [phone].  If you test positive for Covid 19 in the 2 weeks post procedure, please call and report this information to [place].    If any biopsies were taken you will be contacted by phone or by letter within the next 1-3 weeks.  Please call us at [phone] if you have not heard about the biopsies in 3 weeks.    SIGNATURES/CONFIDENTIALITY: You and/or your care partner have signed paperwork which will be entered into your electronic medical record.  These signatures attest to the fact that that the information above on your After Visit Summary has been reviewed and is understood.  Full responsibility of the confidentiality of this discharge information lies with you and/or your care-partner.  Thank you  for letting us take care of your healthcare needs today.

## 2018-10-11 NOTE — Progress Notes (Signed)
Temperature taken by A.M., VS taken by C.B

## 2018-10-13 ENCOUNTER — Telehealth: Payer: Self-pay | Admitting: *Deleted

## 2018-10-13 NOTE — Telephone Encounter (Signed)
  Follow up Call-  Call back number 10/11/2018  Post procedure Call Back phone  # [phone]  Permission to leave phone message Yes  Some recent data might be hidden     Patient questions:  Do you have a fever, pain , or abdominal swelling? No. Pain Score  0 *  Have you tolerated food without any problems? Yes.    Have you been able to return to your normal activities? Yes.    Do you have any questions about your discharge instructions: Diet   No. Medications  No. Follow up visit  No.  Do you have questions or concerns about your Care? No.  Actions: * If pain score is 4 or above: No action needed, pain <4.  1. Have you developed a fever since your procedure? no  2.   Have you had an respiratory symptoms (SOB or cough) since your procedure? no  3.   Have you tested positive for COVID 19 since your procedure no  4.   Have you had any family members/close contacts diagnosed with the COVID 19 since your procedure?  no   If yes to any of these questions please route to Joylene John, RN and Alphonsa Gin, Therapist, sports.

## 2018-10-18 ENCOUNTER — Encounter: Payer: Self-pay | Admitting: Internal Medicine

## 2018-10-28 ENCOUNTER — Other Ambulatory Visit: Payer: Self-pay | Admitting: Endocrinology

## 2018-11-21 ENCOUNTER — Other Ambulatory Visit: Payer: Self-pay | Admitting: Hematology & Oncology

## 2018-12-04 DIAGNOSIS — R7303 Prediabetes: Secondary | ICD-10-CM | POA: Insufficient documentation

## 2018-12-04 DIAGNOSIS — R739 Hyperglycemia, unspecified: Secondary | ICD-10-CM | POA: Insufficient documentation

## 2018-12-04 NOTE — Progress Notes (Signed)
Subjective:    Patient ID: Preston Reeves, male    DOB: July 02, 1940, 78 y.o.   MRN: [id number]  HPI He is here for a physical exam.   He denies any changes in his health since he was here last year.  He has no concerns.  Medications and allergies reviewed with patient and updated if appropriate.  Patient Active Problem List   Diagnosis Date Noted  . Hyperglycemia 12/04/2018  . Diverticulitis of colon 10/09/2015  . Elevated PSA 10/09/2015  . PVC's (premature ventricular contractions) 07/25/2014  . Essential hypertension 07/25/2014  . Incisional hernias s/p lap/open VWH repair w mesh 06/20/2013  . Hypothyroidism 12/28/2012  . MVP (mitral valve prolapse)   . Diverticulosis   . History of BPH   . Hemochromatosis   . COPD (chronic obstructive pulmonary disease) ([place])     Current Outpatient Medications on File Prior to Visit  Medication Sig Dispense Refill  . Ascorbic Acid (VITAMIN C) 1000 MG tablet Take 1,000 mg by mouth daily.      Marland Kitchen aspirin 81 MG tablet Take 81 mg by mouth daily.     . Calcium Carbonate-Vitamin D (CALCIUM + D PO) Take 1 tablet by mouth daily.     . Cholecalciferol (VITAMIN D) 2000 UNITS CAPS Take 2,000 Units by mouth daily.     Mariane Baumgarten Sodium (COLACE PO) Take by mouth.    . fexofenadine-pseudoephedrine (ALLEGRA-D 24) 180-240 MG per 24 hr tablet Take 1 tablet by mouth daily.    Marland Kitchen FIBER PO Take by mouth.    . metoprolol succinate (TOPROL-XL) 50 MG 24 hr tablet TAKE 1 TABLET BY MOUTH DAILY 90 tablet 0  . Multiple Vitamins-Minerals (MULTIVITAMIN PO) Take 1 tablet by mouth daily.     . Omega-3 Fatty Acids (FISH OIL PO) Take 900 mg by mouth daily. Taking 900 daily    . Red Yeast Rice 600 MG CAPS Take 1,200 mg by mouth daily.     Marland Kitchen SALINE NASAL SPRAY NA Place 1 spray into the nose daily as needed (allergies).     . SYNTHROID 150 MCG tablet TAKE 1 TABLET BY MOUTH EVERY DAY 90 tablet 1  . vitamin B-12 (CYANOCOBALAMIN) 1000 MCG tablet Take 1,000 mcg by mouth daily.     Marland Kitchen losartan (COZAAR) 50 MG tablet Take 1 tablet (50 mg total) by mouth 2 (two) times daily. 180 tablet 3   No current facility-administered medications on file prior to visit.     Past Medical History:  Diagnosis Date  . Allergy    SEASONAL  . Asthma   . Cataract   . Colon polyps    Adenomatous  . COPD (chronic obstructive pulmonary disease) ([place])   . Diverticulitis of colon with perforation 2010   Colectomy/ostomy  . Diverticulosis    hx of  . Hemochromatosis    hx of  . History of BPH   . History of COPD   . Hyperlipidemia   . Hypothyroidism   . Incisional hernia   . MVP (mitral valve prolapse)   . Thyroid disease     Past Surgical History:  Procedure Laterality Date  . COLONOSCOPY  last 09/25/2013  . COLONOSCOPY W/ BIOPSIES  2011   Dr Henrene Pastor  . COLOSTOMY TAKEDOWN  2010   Dr Barry Dienes  . INGUINAL HERNIA REPAIR Left 1999   Dr. Rebekah Chesterfield  . INSERTION OF MESH N/A 10/13/2013   Procedure: INSERTION OF MESH;  Surgeon: Michael Boston, MD;  Location: [facility];  Service: General;  Laterality: N/A;  . LAPAROSCOPIC LYSIS OF ADHESIONS N/A 10/13/2013   Procedure: LAPAROSCOPIC LYSIS OF ADHESIONS;  Surgeon: Michael Boston, MD;  Location: [facility];  Service: General;  Laterality: N/A;  . PARTIAL COLECTOMY  08/12/2008   [facility]; perforated colon from diverticulitis  . UPPER GI ENDOSCOPY    . VENTRAL HERNIA REPAIR N/A 10/13/2013   Procedure: REPAIR OF VENTRAL WALL HERNIA WITH UNILATERAL COMPARTMENT SEPARATION;  Surgeon: Michael Boston, MD;  Location: [facility];  Service: General;  Laterality: N/A;    Social History   Socioeconomic History  . Marital status: Married    Spouse name: Not on file  . Number of children: Not on file  . Years of education: Not on file  . Highest education level: Not on file  Occupational History  . Occupation: Retired    Fish farm manager: RETIRED  Social Needs  . Financial resource strain: Not on file  . Food insecurity    Worry: Not on file    Inability: Not on file  .  Transportation needs    Medical: Not on file    Non-medical: Not on file  Tobacco Use  . Smoking status: Former Smoker    Types: Cigarettes    Quit date: 2005    Years since quitting: 15.8  . Smokeless tobacco: Never Used  Substance and Sexual Activity  . Alcohol use: Yes    Alcohol/week: 7.0 standard drinks    Types: 7 Cans of beer per week    Comment: drinks beer weekly  . Drug use: No  . Sexual activity: Yes  Lifestyle  . Physical activity    Days per week: Not on file    Minutes per session: Not on file  . Stress: Not on file  Relationships  . Social Herbalist on phone: Not on file    Gets together: Not on file    Attends religious service: Not on file    Active member of club or organization: Not on file    Attends meetings of clubs or organizations: Not on file    Relationship status: Not on file  Other Topics Concern  . Not on file  Social History Narrative   Married. Education: [employer].    Family History  Problem Relation Age of Onset  . Breast cancer Mother   . Colon cancer Father        42's  . Heart disease Father   . Diabetes Father   . Heart attack Father   . Stroke Neg Hx   . Esophageal cancer Neg Hx   . Rectal cancer Neg Hx   . Stomach cancer Neg Hx   . Colon polyps Neg Hx     Review of Systems  Constitutional: Negative for chills, fatigue and fever.  Eyes: Negative for visual disturbance.  Respiratory: Negative for cough, shortness of breath and wheezing.   Cardiovascular: Negative for chest pain, palpitations and leg swelling.  Gastrointestinal: Negative for abdominal pain, blood in stool, constipation, diarrhea and nausea.       No gerd  Genitourinary: Negative for dysuria and hematuria.  Musculoskeletal: Negative for arthralgias and back pain.  Skin: Negative for color change and rash.  Neurological: Negative for dizziness, light-headedness and headaches.  Psychiatric/Behavioral: Negative for dysphoric mood. The patient is not  nervous/anxious.        Objective:   Vitals:   12/05/18 0758  BP: 140/78  Pulse: 75  Resp: 16  Temp: 97.8 F (36.6 C)  SpO2: 99%   Filed Weights   12/05/18 0758  Weight: 180 lb (81.6 kg)   Body mass index is 25.1 kg/m.  BP Readings from Last 3 Encounters:  12/05/18 140/78  10/11/18 117/68  01/27/18 120/70    Wt Readings from Last 3 Encounters:  12/05/18 180 lb (81.6 kg)  10/11/18 184 lb (83.5 kg)  09/26/18 184 lb (83.5 kg)     Physical Exam Constitutional: He appears well-developed and well-nourished. No distress.  HENT:  Head: Normocephalic and atraumatic.  Right Ear: External ear normal.  Left Ear: External ear normal.  Mouth/Throat: Oropharynx is clear and moist.  Normal ear canals and TM b/l  Eyes: Conjunctivae and EOM are normal.  Neck: Neck supple. No tracheal deviation present. No thyromegaly present.  No carotid bruit  Cardiovascular: Normal rate, regular rhythm, normal heart sounds and intact distal pulses.   No murmur heard. Pulmonary/Chest: Effort normal and breath sounds normal. No respiratory distress. He has no wheezes. He has no rales.  Abdominal: Soft. He exhibits no distension. There is no tenderness.  Genitourinary: deferred  Musculoskeletal: He exhibits no edema.  Lymphadenopathy:   He has no cervical adenopathy.  Skin: Skin is warm and dry. He is not diaphoretic.  Psychiatric: He has a normal mood and affect. His behavior is normal.         Assessment & Plan:   Physical exam: Screening blood work  ordered Immunizations  Flu vaccine today, discussed shingrix Colonoscopy   Up to date  Eye exams   Up to date  Exercise   golf Weight   Normal BMI Substance abuse   none  See Problem List for Assessment and Plan of chronic medical problems.   FU in one year

## 2018-12-04 NOTE — Patient Instructions (Addendum)
Tests ordered today. Your results will be released to [facility] (or called to you) after review.  If any changes need to be made, you will be notified at that same time.  All other Health Maintenance issues reviewed.   All recommended immunizations and age-appropriate screenings are up-to-date or discussed.  Flu immunization administered today.    Medications reviewed and updated.  Changes include :   none    Please followup in 1 year    Health Maintenance, Male Adopting a healthy lifestyle and getting preventive care are important in promoting health and wellness. Ask your health care provider about:  The right schedule for you to have regular tests and exams.  Things you can do on your own to prevent diseases and keep yourself healthy. What should I know about diet, weight, and exercise? Eat a healthy diet   Eat a diet that includes plenty of vegetables, fruits, low-fat dairy products, and lean protein.  Do not eat a lot of foods that are high in solid fats, added sugars, or sodium. Maintain a healthy weight Body mass index (BMI) is a measurement that can be used to identify possible weight problems. It estimates body fat based on height and weight. Your health care provider can help determine your BMI and help you achieve or maintain a healthy weight. Get regular exercise Get regular exercise. This is one of the most important things you can do for your health. Most adults should:  Exercise for at least 150 minutes each week. The exercise should increase your heart rate and make you sweat (moderate-intensity exercise).  Do strengthening exercises at least twice a week. This is in addition to the moderate-intensity exercise.  Spend less time sitting. Even light physical activity can be beneficial. Watch cholesterol and blood lipids Have your blood tested for lipids and cholesterol at 78 years of age, then have this test every 5 years. You may need to have your cholesterol  levels checked more often if:  Your lipid or cholesterol levels are high.  You are older than 78 years of age.  You are at high risk for heart disease. What should I know about cancer screening? Many types of cancers can be detected early and may often be prevented. Depending on your health history and family history, you may need to have cancer screening at various ages. This may include screening for:  Colorectal cancer.  Prostate cancer.  Skin cancer.  Lung cancer. What should I know about heart disease, diabetes, and high blood pressure? Blood pressure and heart disease  High blood pressure causes heart disease and increases the risk of stroke. This is more likely to develop in people who have high blood pressure readings, are of African descent, or are overweight.  Talk with your health care provider about your target blood pressure readings.  Have your blood pressure checked: ? Every 3-5 years if you are 15-43 years of age. ? Every year if you are 46 years old or older.  If you are between the ages of 81 and 58 and are a current or former smoker, ask your health care provider if you should have a one-time screening for abdominal aortic aneurysm (AAA). Diabetes Have regular diabetes screenings. This checks your fasting blood sugar level. Have the screening done:  Once every three years after age 34 if you are at a normal weight and have a low risk for diabetes.  More often and at a younger age if you are overweight or have a  diabetes. What should I know about preventing infection? Hepatitis B If you have a higher risk for hepatitis B, you should be screened for this virus. Talk with your health care provider to find out if you are at risk for hepatitis B infection. Hepatitis C Blood testing is recommended for:  Everyone born from 1945 through 1965.  Anyone with known risk factors for hepatitis C. Sexually transmitted infections (STIs)  You should be screened  each year for STIs, including gonorrhea and chlamydia, if: ? You are sexually active and are younger than 78 years of age. ? You are older than 78 years of age and your health care provider tells you that you are at risk for this type of infection. ? Your sexual activity has changed since you were last screened, and you are at increased risk for chlamydia or gonorrhea. Ask your health care provider if you are at risk.  Ask your health care provider about whether you are at high risk for HIV. Your health care provider may recommend a prescription medicine to help prevent HIV infection. If you choose to take medicine to prevent HIV, you should first get tested for HIV. You should then be tested every 3 months for as long as you are taking the medicine. Follow these instructions at home: Lifestyle  Do not use any products that contain nicotine or tobacco, such as cigarettes, e-cigarettes, and chewing tobacco. If you need help quitting, ask your health care provider.  Do not use street drugs.  Do not share needles.  Ask your health care provider for help if you need support or information about quitting drugs. Alcohol use  Do not drink alcohol if your health care provider tells you not to drink.  If you drink alcohol: ? Limit how much you have to 0-2 drinks a day. ? Be aware of how much alcohol is in your drink. In the [place] one drink equals one 12 oz bottle of beer (355 mL), one 5 oz glass of wine (148 mL), or one 1 oz glass of hard liquor (44 mL). General instructions  Schedule regular health, dental, and eye exams.  Stay current with your vaccines.  Tell your health care provider if: ? You often feel depressed. ? You have ever been abused or do not feel safe at home. Summary  Adopting a healthy lifestyle and getting preventive care are important in promoting health and wellness.  Follow your health care provider's instructions about healthy diet, exercising, and getting tested or  screened for diseases.  Follow your health care provider's instructions on monitoring your cholesterol and blood pressure. This information is not intended to replace advice given to you by your health care provider. Make sure you discuss any questions you have with your health care provider. Document Released: 07/11/2007 Document Revised: 01/05/2018 Document Reviewed: 01/05/2018 Elsevier Patient Education  2020 [employer]vier Inc.  

## 2018-12-05 ENCOUNTER — Encounter: Payer: Self-pay | Admitting: Internal Medicine

## 2018-12-05 ENCOUNTER — Ambulatory Visit (INDEPENDENT_AMBULATORY_CARE_PROVIDER_SITE_OTHER): Payer: PPO | Admitting: Internal Medicine

## 2018-12-05 ENCOUNTER — Other Ambulatory Visit (INDEPENDENT_AMBULATORY_CARE_PROVIDER_SITE_OTHER): Payer: PPO

## 2018-12-05 ENCOUNTER — Other Ambulatory Visit: Payer: Self-pay

## 2018-12-05 VITALS — BP 140/78 | HR 75 | Temp 97.8°F | Resp 16 | Ht 71.0 in | Wt 180.0 lb

## 2018-12-05 DIAGNOSIS — R972 Elevated prostate specific antigen [PSA]: Secondary | ICD-10-CM

## 2018-12-05 DIAGNOSIS — R739 Hyperglycemia, unspecified: Secondary | ICD-10-CM

## 2018-12-05 DIAGNOSIS — Z0000 Encounter for general adult medical examination without abnormal findings: Secondary | ICD-10-CM

## 2018-12-05 DIAGNOSIS — I10 Essential (primary) hypertension: Secondary | ICD-10-CM

## 2018-12-05 DIAGNOSIS — E038 Other specified hypothyroidism: Secondary | ICD-10-CM

## 2018-12-05 DIAGNOSIS — J449 Chronic obstructive pulmonary disease, unspecified: Secondary | ICD-10-CM

## 2018-12-05 DIAGNOSIS — Z23 Encounter for immunization: Secondary | ICD-10-CM | POA: Diagnosis not present

## 2018-12-05 LAB — COMPREHENSIVE METABOLIC PANEL
ALT: 13 U/L (ref 0–53)
AST: 21 U/L (ref 0–37)
Albumin: 4.2 g/dL (ref 3.5–5.2)
Alkaline Phosphatase: 67 U/L (ref 39–117)
BUN: 16 mg/dL (ref 6–23)
CO2: 29 mEq/L (ref 19–32)
Calcium: 9.7 mg/dL (ref 8.4–10.5)
Chloride: 101 mEq/L (ref 96–112)
Creatinine, Ser: 0.96 mg/dL (ref 0.40–1.50)
GFR: 75.6 mL/min (ref >60.00)
Glucose, Bld: 98 mg/dL (ref 70–99)
Potassium: 4 mEq/L (ref 3.5–5.1)
Sodium: 138 mEq/L (ref 135–145)
Total Bilirubin: 1 mg/dL (ref 0.2–1.2)
Total Protein: 7.8 g/dL (ref 6.0–8.3)

## 2018-12-05 LAB — CBC WITH DIFFERENTIAL/PLATELET
Basophils Absolute: 0 10*3/uL (ref 0.0–0.1)
Basophils Relative: 0.6 % (ref 0.0–3.0)
Eosinophils Absolute: 0.2 10*3/uL (ref 0.0–0.7)
Eosinophils Relative: 4.3 % (ref 0.0–5.0)
HCT: 42.6 % (ref 39.0–52.0)
Hemoglobin: 14.8 g/dL (ref 13.0–17.0)
Lymphocytes Relative: 22.3 % (ref 12.0–46.0)
Lymphs Abs: 1 10*3/uL (ref 0.7–4.0)
MCHC: 34.8 g/dL (ref 30.0–36.0)
MCV: 99.8 fl (ref 78.0–100.0)
Monocytes Absolute: 0.6 10*3/uL (ref 0.1–1.0)
Monocytes Relative: 13.1 % — ABNORMAL HIGH (ref 3.0–12.0)
Neutro Abs: 2.6 10*3/uL (ref 1.4–7.7)
Neutrophils Relative %: 59.7 % (ref 43.0–77.0)
Platelets: 248 10*3/uL (ref 150.0–400.0)
RBC: 4.26 Mil/uL (ref 4.22–5.81)
RDW: 13.3 % (ref 11.5–15.5)
WBC: 4.3 10*3/uL (ref 4.0–10.5)

## 2018-12-05 LAB — LIPID PANEL
Cholesterol: 150 mg/dL (ref 0–200)
HDL: 40.2 mg/dL (ref >39.00)
LDL Cholesterol: 93 mg/dL (ref 0–99)
NonHDL: 110.05
Total CHOL/HDL Ratio: 4
Triglycerides: 85 mg/dL (ref 0.0–149.0)
VLDL: 17 mg/dL (ref 0.0–40.0)

## 2018-12-05 LAB — TSH: TSH: 1.55 u[IU]/mL (ref 0.35–4.50)

## 2018-12-05 LAB — HEMOGLOBIN A1C: Hgb A1c MFr Bld: 5.5 % (ref 4.6–6.5)

## 2018-12-05 NOTE — Assessment & Plan Note (Signed)
Management per Dr. Dwyane Dee We will add TSH to his blood work

## 2018-12-05 NOTE — Assessment & Plan Note (Signed)
Asymptomatic, no shortness of breath with activity

## 2018-12-05 NOTE — Assessment & Plan Note (Signed)
Following with Dr. Willia Craze an appointment next month

## 2018-12-05 NOTE — Assessment & Plan Note (Signed)
Check A1c. 

## 2018-12-05 NOTE — Assessment & Plan Note (Signed)
Following with urology

## 2018-12-05 NOTE — Assessment & Plan Note (Signed)
BP well controlled Current regimen effective and well tolerated Continue current medications at current doses cmp  

## 2018-12-09 ENCOUNTER — Encounter: Payer: PPO | Admitting: Internal Medicine

## 2018-12-16 ENCOUNTER — Other Ambulatory Visit: Payer: Self-pay

## 2018-12-22 ENCOUNTER — Other Ambulatory Visit: Payer: Self-pay | Admitting: Endocrinology

## 2018-12-22 DIAGNOSIS — E890 Postprocedural hypothyroidism: Secondary | ICD-10-CM

## 2018-12-26 ENCOUNTER — Other Ambulatory Visit: Payer: Self-pay

## 2018-12-26 ENCOUNTER — Other Ambulatory Visit (INDEPENDENT_AMBULATORY_CARE_PROVIDER_SITE_OTHER): Payer: PPO

## 2018-12-26 DIAGNOSIS — E890 Postprocedural hypothyroidism: Secondary | ICD-10-CM

## 2018-12-26 LAB — TSH: TSH: 0.87 u[IU]/mL (ref 0.35–4.50)

## 2018-12-26 LAB — T4, FREE: Free T4: 1.44 ng/dL (ref 0.60–1.60)

## 2018-12-26 NOTE — Patient Instructions (Signed)
[id number] 

## 2018-12-28 ENCOUNTER — Encounter: Payer: Self-pay | Admitting: Endocrinology

## 2018-12-28 ENCOUNTER — Ambulatory Visit: Payer: PPO | Admitting: Hematology & Oncology

## 2018-12-28 ENCOUNTER — Other Ambulatory Visit: Payer: Self-pay

## 2018-12-28 ENCOUNTER — Other Ambulatory Visit: Payer: PPO

## 2018-12-28 ENCOUNTER — Ambulatory Visit (INDEPENDENT_AMBULATORY_CARE_PROVIDER_SITE_OTHER): Payer: PPO | Admitting: Endocrinology

## 2018-12-28 DIAGNOSIS — E890 Postprocedural hypothyroidism: Secondary | ICD-10-CM

## 2018-12-28 NOTE — Progress Notes (Signed)
Reason for Appointment:  Hypothyroidism, followup visit   Today's office visit was provided via telemedicine using a telephone call because of failure of the video technique Explained to the patient and the the limitations of evaluation and management by telemedicine and the availability of in person appointments.  The patient understood the limitations and agreed to proceed. Patient also understood that the telehealth visit is billable. . Location of the patient: Home . Location of the provider: Office Only the patient and myself were participating in the encounter     History of Present Illness:   The hypothyroidism was first diagnosed in 2007 after radioactive iodine treatment for Graves' disease   The patient has been treated with  Levoxyl, generic levothyroxine and subsequently brand name Synthroid  His last dose change was in 11/15 and at that time his TSH was slightly increased at 4.7 and his dose was increased to 150 g He thinks he may have felt a little less fatigue with this change   He takes brand name Synthroid and gets the prescription from [facility]      Recently has felt well with no fatigue, sluggishness Also low shakiness or heat or cold intolerance His weight is down slightly as of about 3 to 4 weeks ago but he was trying to get it down  The patient is taking the thyroid supplement consistently in the morning before breakfast with water.  He is taking calcium at night and also takes vitamins separately   Although his TSH is 0.87 it was 1.55 last month done by PCP from the same lab   Lab Results  Component Value Date   TSH 0.87 12/26/2018   TSH 1.55 12/05/2018   TSH 3.45 06/22/2018   FREET4 1.44 12/26/2018   FREET4 1.28 06/22/2018   FREET4 1.11 12/03/2017    Wt Readings from Last 3 Encounters:  12/05/18 180 lb (81.6 kg)  10/11/18 184 lb (83.5 kg)  09/26/18 184 lb (83.5 kg)    Allergies as of 12/28/2018   No Known Allergies      Medication List       Accurate as of December 28, 2018 12:16 PM. If you have any questions, ask your nurse or doctor.        aspirin 81 MG tablet Take 81 mg by mouth daily.   CALCIUM + D PO Take 1 tablet by mouth daily.   COLACE PO Take by mouth.   fexofenadine-pseudoephedrine 180-240 MG 24 hr tablet Commonly known as: ALLEGRA-D 24 Take 1 tablet by mouth daily.   FIBER PO Take by mouth.   FISH OIL PO Take 900 mg by mouth daily. Taking 900 daily   losartan 50 MG tablet Commonly known as: COZAAR Take 1 tablet (50 mg total) by mouth 2 (two) times daily.   metoprolol succinate 50 MG 24 hr tablet Commonly known as: TOPROL-XL TAKE 1 TABLET BY MOUTH DAILY   MULTIVITAMIN PO Take 1 tablet by mouth daily.   Red Yeast Rice 600 MG Caps Take 1,200 mg by mouth daily.   SALINE NASAL SPRAY NA Place 1 spray into the nose daily as needed (allergies).   Synthroid 150 MCG tablet Generic drug: levothyroxine TAKE 1 TABLET BY MOUTH EVERY DAY   vitamin B-12 1000 MCG tablet Commonly known as: CYANOCOBALAMIN Take 1,000 mcg by mouth daily.   vitamin C 1000 MG tablet Take 1,000 mg by mouth daily.   Vitamin D 50 MCG (2000 UT) Caps Take 2,000 Units by mouth daily.  Allergies: No Known Allergies  Past Medical History:  Diagnosis Date  . Allergy    SEASONAL  . Asthma   . Cataract   . Colon polyps    Adenomatous  . COPD (chronic obstructive pulmonary disease) ([place])   . Diverticulitis of colon with perforation 2010   Colectomy/ostomy  . Diverticulosis    hx of  . Hemochromatosis    hx of  . History of BPH   . History of COPD   . Hyperlipidemia   . Hypothyroidism   . Incisional hernia   . MVP (mitral valve prolapse)   . Thyroid disease     Past Surgical History:  Procedure Laterality Date  . COLONOSCOPY  last 09/25/2013  . COLONOSCOPY W/ BIOPSIES  2011   Dr Henrene Pastor  . COLOSTOMY TAKEDOWN  2010   Dr Barry Dienes  . INGUINAL HERNIA REPAIR Left 1999   Dr. Rebekah Chesterfield  .  INSERTION OF MESH N/A 10/13/2013   Procedure: INSERTION OF MESH;  Surgeon: Michael Boston, MD;  Location: [place];  Service: General;  Laterality: N/A;  . LAPAROSCOPIC LYSIS OF ADHESIONS N/A 10/13/2013   Procedure: LAPAROSCOPIC LYSIS OF ADHESIONS;  Surgeon: Michael Boston, MD;  Location: [place];  Service: General;  Laterality: N/A;  . PARTIAL COLECTOMY  08/12/2008   [facility]; perforated colon from diverticulitis  . UPPER GI ENDOSCOPY    . VENTRAL HERNIA REPAIR N/A 10/13/2013   Procedure: REPAIR OF VENTRAL WALL HERNIA WITH UNILATERAL COMPARTMENT SEPARATION;  Surgeon: Michael Boston, MD;  Location: [place] [facility];  Service: General;  Laterality: N/A;    Family History  Problem Relation Age of Onset  . Breast cancer Mother   . Colon cancer Father        84's  . Heart disease Father   . Diabetes Father   . Heart attack Father   . Stroke Neg Hx   . Esophageal cancer Neg Hx   . Rectal cancer Neg Hx   . Stomach cancer Neg Hx   . Colon polyps Neg Hx     Social History:  reports that he quit smoking about 15 years ago. His smoking use included cigarettes. He has never used smokeless tobacco. He reports current alcohol use of about 7.0 standard drinks of alcohol per week. He reports that he does not use drugs.   ROS  Hypertension followed by other physicians  BP Readings from Last 3 Encounters:  12/05/18 140/78  10/11/18 117/68  01/27/18 120/70     Examination:   There were no vitals taken for this visit.      Assessment   Hypothyroidism, post ablative treated with brand name Synthroid 150 g daily  He has subjectively felt well His dose has not been changed for 5 years now He does appear to have slight fluctuation in his TSH from time to time  He is very consistent with taking his Synthroid before breakfast daily He is able to continue brand-name Synthroid without cost issues  Although TSH is slightly lower below 1.0 he is asymptomatic, TSH was 1.55 last month     Treatment: He will  now be seen once a year  He will continue to get his prescription from [facility]  To call if he has any unusual weight change or fatigue  Elayne Snare 12/28/2018, 12:16 PM    Telephone encounter time =5 minutes

## 2018-12-29 ENCOUNTER — Ambulatory Visit: Payer: PPO | Admitting: Endocrinology

## 2019-01-10 ENCOUNTER — Ambulatory Visit: Payer: PPO | Admitting: Endocrinology

## 2019-01-11 DIAGNOSIS — H40013 Open angle with borderline findings, low risk, bilateral: Secondary | ICD-10-CM | POA: Diagnosis not present

## 2019-01-23 DIAGNOSIS — R972 Elevated prostate specific antigen [PSA]: Secondary | ICD-10-CM | POA: Diagnosis not present

## 2019-01-26 DIAGNOSIS — R972 Elevated prostate specific antigen [PSA]: Secondary | ICD-10-CM | POA: Diagnosis not present

## 2019-02-18 ENCOUNTER — Other Ambulatory Visit: Payer: Self-pay | Admitting: Hematology & Oncology

## 2019-02-21 ENCOUNTER — Other Ambulatory Visit: Payer: Self-pay | Admitting: Cardiology

## 2019-02-21 DIAGNOSIS — I10 Essential (primary) hypertension: Secondary | ICD-10-CM

## 2019-02-23 ENCOUNTER — Other Ambulatory Visit: Payer: Self-pay | Admitting: Hematology & Oncology

## 2019-02-23 MED ORDER — METOPROLOL SUCCINATE ER 50 MG PO TB24: 50.0000 mg | ORAL_TABLET | Freq: Every day | ORAL | 0 refills | Status: DC

## 2019-03-26 ENCOUNTER — Other Ambulatory Visit: Payer: Self-pay | Admitting: Cardiology

## 2019-03-26 DIAGNOSIS — I10 Essential (primary) hypertension: Secondary | ICD-10-CM

## 2019-03-28 ENCOUNTER — Other Ambulatory Visit: Payer: Self-pay | Admitting: Cardiology

## 2019-03-28 DIAGNOSIS — I10 Essential (primary) hypertension: Secondary | ICD-10-CM

## 2019-03-29 ENCOUNTER — Other Ambulatory Visit: Payer: Self-pay

## 2019-03-29 DIAGNOSIS — I10 Essential (primary) hypertension: Secondary | ICD-10-CM

## 2019-03-29 MED ORDER — LOSARTAN POTASSIUM 50 MG PO TABS: 50.0000 mg | ORAL_TABLET | Freq: Two times a day (BID) | ORAL | 0 refills | Status: DC

## 2019-03-30 ENCOUNTER — Other Ambulatory Visit: Payer: Self-pay | Admitting: Cardiology

## 2019-03-30 DIAGNOSIS — I10 Essential (primary) hypertension: Secondary | ICD-10-CM

## 2019-03-30 NOTE — Telephone Encounter (Signed)
  *  STAT* If patient is at the pharmacy, call can be transferred to refill team.   1. Which medications need to be refilled? (please list name of each medication and dose if known) losartan (COZAAR) 50 MG tablet  2. Which pharmacy/location (including street and city if local pharmacy) is medication to be sent to? [id number] - [place], [place] - [address]  3. Do they need a 30 day or 90 day supply? 30 days  Pt made an appt on 04/25/19

## 2019-04-05 MED ORDER — LOSARTAN POTASSIUM 50 MG PO TABS: 50.0000 mg | ORAL_TABLET | Freq: Two times a day (BID) | ORAL | 0 refills | Status: DC

## 2019-04-18 NOTE — Progress Notes (Signed)
Cardiology Office Note    Date:  04/25/2019   ID:  Preston Reeves, DOB 1940/02/19, MRN [id number]  PCP:  Binnie Rail, MD  Cardiologist: Ena Dawley, MD EPS: None  Chief Complaint  Patient presents with   Follow-up    History of Present Illness:  Preston Reeves is a 79 y.o. male with history of HTN, MVP, HLD, thyrotoxicosis, hemochromatosis, hypothyroidism S/P RAI  Patient last seen by HTN clinic 01/27/18 and losartan increased to 50 mg bid.  Patient some in for yearly f/u. BP running 120's at home. Does eat some frozen dinners. Denies chest pain, palpitations, edema. Has some dyspnea on exertion if he is rushing- unchanged. Quit smoking 2005. Gets his [id number] vaccine tomorrow.     Past Medical History:  Diagnosis Date   Allergy    SEASONAL   Asthma    Cataract    Colon polyps    Adenomatous   COPD (chronic obstructive pulmonary disease) ([place])    Diverticulitis of colon with perforation 2010   Colectomy/ostomy   Diverticulosis    hx of   Hemochromatosis    hx of   History of BPH    History of COPD    Hyperlipidemia    Hypothyroidism    Incisional hernia    MVP (mitral valve prolapse)    Thyroid disease     Past Surgical History:  Procedure Laterality Date   COLONOSCOPY  last 09/25/2013   COLONOSCOPY W/ BIOPSIES  2011   Dr Henrene Pastor   COLOSTOMY TAKEDOWN  2010   Dr Barry Dienes   INGUINAL HERNIA REPAIR Left 1999   Dr. Rebekah Chesterfield   INSERTION OF MESH N/A 10/13/2013   Procedure: INSERTION OF MESH;  Surgeon: Michael Boston, MD;  Location: [facility] OR;  Service: General;  Laterality: N/A;   LAPAROSCOPIC LYSIS OF ADHESIONS N/A 10/13/2013   Procedure: LAPAROSCOPIC LYSIS OF ADHESIONS;  Surgeon: Michael Boston, MD;  Location: [facility] OR;  Service: General;  Laterality: N/A;   PARTIAL COLECTOMY  08/12/2008   [facility]; perforated colon from diverticulitis   UPPER GI ENDOSCOPY     VENTRAL HERNIA REPAIR N/A 10/13/2013   Procedure: REPAIR OF VENTRAL WALL HERNIA WITH  UNILATERAL COMPARTMENT SEPARATION;  Surgeon: Michael Boston, MD;  Location: [facility] OR;  Service: General;  Laterality: N/A;    Current Medications: Current Meds  Medication Sig   Ascorbic Acid (VITAMIN C) 1000 MG tablet Take 1,000 mg by mouth daily.     aspirin 81 MG tablet Take 81 mg by mouth daily.    Calcium Carbonate-Vitamin D (CALCIUM + D PO) Take 1 tablet by mouth daily.    Cholecalciferol (VITAMIN D) 2000 UNITS CAPS Take 2,000 Units by mouth daily.    Docusate Sodium (COLACE PO) Take by mouth daily.    fexofenadine-pseudoephedrine (ALLEGRA-D 24) 180-240 MG per 24 hr tablet Take 1 tablet by mouth as needed.    FIBER PO Take by mouth daily.    losartan (COZAAR) 50 MG tablet Take 1 tablet (50 mg total) by mouth 2 (two) times daily. Please keep upcoming appt for refills. Thank you   metoprolol succinate (TOPROL-XL) 50 MG 24 hr tablet Take 1 tablet (50 mg total) by mouth daily. Take with or immediately following a meal.   Multiple Vitamins-Minerals (MULTIVITAMIN PO) Take 1 tablet by mouth daily.    Omega-3 Fatty Acids (FISH OIL PO) Take 900 mg by mouth daily. Taking 900 daily   Red Yeast Rice 600 MG CAPS Take 1,200 mg by mouth  daily.    SALINE NASAL SPRAY NA Place 1 spray into the nose daily as needed (allergies).    SYNTHROID 150 MCG tablet TAKE 1 TABLET BY MOUTH EVERY DAY   vitamin B-12 (CYANOCOBALAMIN) 1000 MCG tablet Take 1,000 mcg by mouth daily.     Allergies:   Patient has no known allergies.   Social History   Socioeconomic History   Marital status: Married    Spouse name: Not on file   Number of children: Not on file   Years of education: Not on file   Highest education level: Not on file  Occupational History   Occupation: Retired    Fish farm manager: RETIRED  Tobacco Use   Smoking status: Former Smoker    Types: Cigarettes    Quit date: 2005    Years since quitting: 16.2   Smokeless tobacco: Never Used  Substance and Sexual Activity   Alcohol use: Yes      Alcohol/week: 7.0 standard drinks    Types: 7 Cans of beer per week    Comment: drinks beer weekly   Drug use: No   Sexual activity: Yes  Other Topics Concern   Not on file  Social History Narrative   Married. Education: [employer].   Social Determinants of Health   Financial Resource Strain:    Difficulty of Paying Living Expenses:   Food Insecurity:    Worried About Charity fundraiser in the Last Year:    Arboriculturist in the Last Year:   Transportation Needs:    Film/video editor (Medical):    Lack of Transportation (Non-Medical):   Physical Activity:    Days of Exercise per Week:    Minutes of Exercise per Session:   Stress:    Feeling of Stress :   Social Connections:    Frequency of Communication with Friends and Family:    Frequency of Social Gatherings with Friends and Family:    Attends Religious Services:    Active Member of Clubs or Organizations:    Attends Music therapist:    Marital Status:      Family History:  The patient's family history includes Breast cancer in his mother; Colon cancer in his father; Diabetes in his father; Heart attack in his father; Heart disease in his father.   ROS:   Please see the history of present illness.    ROS All other systems reviewed and are negative.   PHYSICAL EXAM:   VS:  BP (!) 144/88    Pulse 74    Ht 5\' 11"  (1.803 m)    Wt 179 lb 6.4 oz (81.4 kg)    SpO2 96%    BMI 25.02 kg/m   Physical Exam  GEN: Well nourished, well developed, in no acute distress  Neck: no JVD, carotid bruits, or masses Cardiac:RRR; no murmurs, rubs, or gallops  Respiratory:  clear to auscultation bilaterally, normal work of breathing GI: soft, nontender, nondistended, + BS Ext: without cyanosis, clubbing, or edema, Good distal pulses bilaterally Neuro:  Alert and Oriented x 3 Psych: euthymic mood, full affect  Wt Readings from Last 3 Encounters:  04/25/19 179 lb 6.4 oz (81.4 kg)  12/05/18 180 lb  (81.6 kg)  10/11/18 184 lb (83.5 kg)      Studies/Labs Reviewed:   EKG:  EKG is  ordered today.  The ekg ordered today demonstrates NSR with LBBB  Recent Labs: 12/05/2018: ALT 13; BUN 16; Creatinine, Ser 0.96; Hemoglobin 14.8; Platelets 248.0;  Potassium 4.0; Sodium 138 12/26/2018: TSH 0.87   Lipid Panel    Component Value Date/Time   CHOL 150 12/05/2018 0853   CHOL 155 09/18/2016 0826   TRIG 85.0 12/05/2018 0853   HDL 40.20 12/05/2018 0853   HDL 41 09/18/2016 0826   CHOLHDL 4 12/05/2018 0853   VLDL 17.0 12/05/2018 0853   LDLCALC 93 12/05/2018 0853   LDLCALC 100 (H) 09/18/2016 0826    Additional studies/ records that were reviewed today include:  TTE: 12/2015 - Left ventricle: The cavity size was normal. Systolic function was   vigorous. The estimated ejection fraction was in the range of 65%   to 70%. Wall motion was normal; there were no regional wall   motion abnormalities. Doppler parameters are consistent with   abnormal left ventricular relaxation (grade 1 diastolic   dysfunction). There was no evidence of elevated ventricular   filling pressure by Doppler parameters. - Aortic valve: Valve mobility was restricted. There was mild   stenosis. There was no regurgitation. - Aortic root: The aortic root was normal in size. - Ascending aorta: The ascending aorta was normal in size. - Mitral valve: There was trivial regurgitation. - Left atrium: The atrium was normal in size. - Right ventricle: Systolic function was normal. - Right atrium: The atrium was normal in size. - Tricuspid valve: There was trivial regurgitation. - Pulmonary arteries: Systolic pressure was at the upper limits of   normal. PA peak pressure: 31 mm Hg (S). - Inferior vena cava: The vessel was normal in size. - Pericardium, extracardiac: There was no pericardial effusion.       ASSESSMENT:    1. Essential hypertension   2. Hyperlipidemia, unspecified hyperlipidemia type   3. Hereditary  hemochromatosis ([place])   4. LBBB (left bundle branch block)      PLAN:  In order of problems listed above:  HTN BP has been controlled at home but up a little today. Getting extra salt in his diet. 2 gm sodium diet  HLD- LDL 93 12/05/18-no history of CAD  History of hemachromatosis, echo stable 2017  chroic LBBB    Medication Adjustments/Labs and Tests Ordered: Current medicines are reviewed at length with the patient today.  Concerns regarding medicines are outlined above.  Medication changes, Labs and Tests ordered today are listed in the Patient Instructions below. Patient Instructions   Medication Instructions:  Your physician recommends that you continue on your current medications as directed. Please refer to the Current Medication list given to you today.  *If you need a refill on your cardiac medications before your next appointment, please call your pharmacy*   Lab Work: None  If you have labs (blood work) drawn today and your tests are completely normal, you will receive your results only by:  [place] (if you have MyChart) OR  A paper copy in the mail If you have any lab test that is abnormal or we need to change your treatment, we will call you to review the results.   Testing/Procedures: None   Follow-Up: At [facility], you and your health needs are our priority.  As part of our continuing mission to provide you with exceptional heart care, we have created designated Provider Care Teams.  These Care Teams include your primary Cardiologist (physician) and Advanced Practice Providers (APPs -  Physician Assistants and Nurse Practitioners) who all work together to provide you with the care you need, when you need it.  We recommend signing up for the patient  portal called "MyChart".  Sign up information is provided on this After Visit Summary.  MyChart is used to connect with patients for Virtual Visits (Telemedicine).  Patients are able to view lab/test  results, encounter notes, upcoming appointments, etc.  Non-urgent messages can be sent to your provider as well.   To learn more about what you can do with MyChart, go to [email or web address].    Your next appointment:   12 month(s)  The format for your next appointment:   In Person  Provider:   You may see Ena Dawley, MD or one of the following Advanced Practice Providers on your designated Care Team:    Melina Copa, PA-C  Ermalinda Barrios, PA-C    Other Instructions Your provider recommends that you maintain 150 minutes per week of moderate aerobic activity.  Two Gram Sodium Diet 2000 mg  What is Sodium? Sodium is a mineral found naturally in many foods. The most significant source of sodium in the diet is table salt, which is about 40% sodium.  Processed, convenience, and preserved foods also contain a large amount of sodium.  The body needs only 500 mg of sodium daily to function,  A normal diet provides more than enough sodium even if you do not use salt.  Why Limit Sodium? A build up of sodium in the body can cause thirst, increased blood pressure, shortness of breath, and water retention.  Decreasing sodium in the diet can reduce edema and risk of heart attack or stroke associated with high blood pressure.  Keep in mind that there are many other factors involved in these health problems.  Heredity, obesity, lack of exercise, cigarette smoking, stress and what you eat all play a role.  General Guidelines:  Do not add salt at the table or in cooking.  One teaspoon of salt contains over 2 grams of sodium.  Read food labels  Avoid processed and convenience foods  Ask your dietitian before eating any foods not dicussed in the menu planning guidelines  Consult your physician if you wish to use a salt substitute or a sodium containing medication such as antacids.  Limit milk and milk products to 16 oz (2 cups) per day.  Shopping Hints:  READ LABELS!! "Dietetic" does not  necessarily mean low sodium.  Salt and other sodium ingredients are often added to foods during processing.   Menu Planning Guidelines Food Group Choose More Often Avoid  Beverages (see also the milk group All fruit juices, low-sodium, salt-free vegetables juices, low-sodium carbonated beverages Regular vegetable or tomato juices, commercially softened water used for drinking or cooking  Breads and Cereals Enriched white, wheat, rye and pumpernickel bread, hard rolls and dinner rolls; muffins, cornbread and waffles; most dry cereals, cooked cereal without added salt; unsalted crackers and breadsticks; low sodium or homemade bread crumbs Bread, rolls and crackers with salted tops; quick breads; instant hot cereals; pancakes; commercial bread stuffing; self-rising flower and biscuit mixes; regular bread crumbs or cracker crumbs  Desserts and Sweets Desserts and sweets mad with mild should be within allowance Instant pudding mixes and cake mixes  Fats Butter or margarine; vegetable oils; unsalted salad dressings, regular salad dressings limited to 1 Tbs; light, sour and heavy cream Regular salad dressings containing bacon fat, bacon bits, and salt pork; snack dips made with instant soup mixes or processed cheese; salted nuts  Fruits Most fresh, frozen and canned fruits Fruits processed with salt or sodium-containing ingredient (some dried fruits are processed with sodium sulfites  Vegetables Fresh, frozen vegetables and low- sodium canned vegetables Regular canned vegetables, sauerkraut, pickled vegetables, and others prepared in brine; frozen vegetables in sauces; vegetables seasoned with ham, bacon or salt pork  Condiments, Sauces, Miscellaneous  Salt substitute with physician's approval; pepper, herbs, spices; vinegar, lemon or lime juice; hot pepper sauce; garlic powder, onion powder, low sodium soy sauce (1 Tbs.); low sodium condiments (ketchup, chili sauce, mustard) in limited amounts (1  tsp.) fresh ground horseradish; unsalted tortilla chips, pretzels, potato chips, popcorn, salsa (1/4 cup) Any seasoning made with salt including garlic salt, celery salt, onion salt, and seasoned salt; sea salt, rock salt, kosher salt; meat tenderizers; monosodium glutamate; mustard, regular soy sauce, barbecue, sauce, chili sauce, teriyaki sauce, steak sauce, Worcestershire sauce, and most flavored vinegars; canned gravy and mixes; regular condiments; salted snack foods, olives, picles, relish, horseradish sauce, catsup   Food preparation: Try these seasonings Meats:    Pork Sage, onion Serve with applesauce  Chicken Poultry seasoning, thyme, parsley Serve with cranberry sauce  Lamb Curry powder, rosemary, garlic, thyme Serve with mint sauce or jelly  Veal Marjoram, basil Serve with current jelly, cranberry sauce  Beef Pepper, bay leaf Serve with dry mustard, unsalted chive butter  Fish [place] leaf, dill Serve with unsalted lemon butter, unsalted parsley butter  Vegetables:    Asparagus Lemon juice   Broccoli Lemon juice   Carrots Mustard dressing parsley, mint, nutmeg, glazed with unsalted butter and sugar   Green beans Marjoram, lemon juice, nutmeg,dill seed   Tomatoes Basil, marjoram, onion   Spice /blend for [employer]" 4 tsp ground thyme 1 tsp ground sage 3 tsp ground rosemary 4 tsp ground marjoram   Test your knowledge 1. A product that says "Salt Free" may still contain sodium. True or False 2. Garlic Powder and Hot Pepper Sauce an be used as alternative seasonings.True or False 3. Processed foods have more sodium than fresh foods.  True or False 4. Canned Vegetables have less sodium than froze True or False  WAYS TO DECREASE YOUR SODIUM INTAKE 1. Avoid the use of added salt in cooking and at the table.  Table salt (and other prepared seasonings which contain salt) is probably one of the greatest sources of sodium in the diet.  Unsalted foods can gain flavor from the sweet, sour,  and butter taste sensations of herbs and spices.  Instead of using salt for seasoning, try the following seasonings with the foods listed.  Remember: how you use them to enhance natural food flavors is limited only by your creativity... Allspice-Meat, fish, eggs, fruit, peas, red and yellow vegetables Almond Extract-Fruit baked goods Anise Seed-Sweet breads, fruit, carrots, beets, cottage cheese, cookies (tastes like licorice) Basil-Meat, fish, eggs, vegetables, rice, vegetables salads, soups, sauces Bay Leaf-Meat, fish, stews, poultry Burnet-Salad, vegetables (cucumber-like flavor) Caraway Seed-Bread, cookies, cottage cheese, meat, vegetables, cheese, rice Cardamon-Baked goods, fruit, soups Celery Powder or seed-Salads, salad dressings, sauces, meatloaf, soup, bread.Do not use  celery salt Chervil-Meats, salads, fish, eggs, vegetables, cottage cheese (parsley-like flavor) Chili Power-Meatloaf, chicken cheese, corn, eggplant, egg dishes Chives-Salads cottage cheese, egg dishes, soups, vegetables, sauces Cilantro-Salsa, casseroles Cinnamon-Baked goods, fruit, pork, lamb, chicken, carrots Cloves-Fruit, baked goods, fish, pot roast, green beans, beets, carrots Coriander-Pastry, cookies, meat, salads, cheese (lemon-orange flavor) Cumin-Meatloaf, fish,cheese, eggs, cabbage,fruit pie (caraway flavor) [employer], fruit, eggs, fish, poultry, cottage cheese, vegetables Dill Seed-Meat, cottage cheese, poultry, vegetables, fish, salads, bread Fennel Seed-Bread, cookies, apples, pork, eggs, fish, beets, cabbage, cheese, Licorice-like flavor Garlic-(buds or powder)  Salads, meat, poultry, fish, bread, butter, vegetables, potatoes.Do not  use garlic salt Ginger-Fruit, vegetables, baked goods, meat, fish, poultry Horseradish Root-Meet, vegetables, butter Lemon Juice or Extract-Vegetables, fruit, tea, baked goods, fish salads Mace-Baked goods fruit, vegetables, fish, poultry (taste like nutmeg) Maple  Extract-Syrups Marjoram-Meat, chicken, fish, vegetables, breads, green salads (taste like Sage) Mint-Tea, lamb, sherbet, vegetables, desserts, carrots, cabbage Mustard, Dry or Seed-Cheese, eggs, meats, vegetables, poultry Nutmeg-Baked goods, fruit, chicken, eggs, vegetables, desserts Onion Powder-Meat, fish, poultry, vegetables, cheese, eggs, bread, rice salads (Do not use   Onion salt) Orange Extract-Desserts, baked goods Oregano-Pasta, eggs, cheese, onions, pork, lamb, fish, chicken, vegetables, green salads Paprika-Meat, fish, poultry, eggs, cheese, vegetables Parsley Flakes-Butter, vegetables, meat fish, poultry, eggs, bread, salads (certain forms may   Contain sodium Pepper-Meat fish, poultry, vegetables, eggs Peppermint Extract-Desserts, baked goods Poppy Seed-Eggs, bread, cheese, fruit dressings, baked goods, noodles, vegetables, cottage  [employer], poultry, meat, fish, cauliflower, turnips,eggs bread Saffron-Rice, bread, veal, chicken, fish, eggs Sage-Meat, fish, poultry, onions, eggplant, tomateos, pork, stews Savory-Eggs, salads, poultry, meat, rice, vegetables, soups, pork Tarragon-Meat, poultry, fish, eggs, butter, vegetables (licorice-like flavor)  Thyme-Meat, poultry, fish, eggs, vegetables, (clover-like flavor), sauces, soups Tumeric-Salads, butter, eggs, fish, rice, vegetables (saffron-like flavor) Vanilla Extract-Baked goods, candy Vinegar-Salads, vegetables, meat marinades Walnut Extract-baked goods, candy  2. Choose your Foods Wisely   The following is a list of foods to avoid which are high in sodium:  Meats-Avoid all smoked, canned, salt cured, dried and kosher meat and fish as well as Anchovies   [employer] meats:Bologna, Liverwurst, Pastrami Canned meat or fish  Marinated herring Caviar    Pepperoni Corned Beef   Pizza Dried chipped beef  Salami Frozen breaded fish or meat Salt pork Frankfurters or hot  dogs  Sardines Gefilte fish   Sausage Ham (boiled ham, Proscuitto Smoked butt    spiced ham)   Spam      TV Dinners Vegetables Canned vegetables (Regular) Relish Canned mushrooms  Sauerkraut Olives    Tomato juice Pickles  Bakery and Dessert Products Canned puddings  Cream pies Cheesecake   Decorated cakes Cookies  Beverages/Juices Tomato juice, regular  Gatorade   V-8 vegetable juice, regular  Breads and Cereals Biscuit mixes   Salted potato chips, corn chips, pretzels Bread stuffing mixes  Salted crackers and rolls Pancake and waffle mixes Self-rising flour  Seasonings Accent    Meat sauces Barbecue sauce  Meat tenderizer Catsup    Monosodium glutamate (MSG) Celery salt   Onion salt Chili sauce   Prepared mustard Garlic salt   Salt, seasoned salt, sea salt Gravy mixes   Soy sauce Horseradish   Steak sauce Ketchup   Tartar sauce Lite salt    Teriyaki sauce Marinade mixes   Worcestershire sauce  Others Baking powder   Cocoa and cocoa mixes Baking soda   Commercial casserole mixes Candy-caramels, chocolate  Dehydrated soups    Bars, fudge,nougats  Instant rice and pasta mixes Canned broth or soup  Maraschino cherries Cheese, aged and processed cheese and cheese spreads  Learning Assessment Quiz  Indicated T (for True) or F (for False) for each of the following statements:  1. _____ Fresh fruits and vegetables and unprocessed grains are generally low in sodium 2. _____ Water may contain a considerable amount of sodium, depending on the source 3. _____ You can always tell if a food is high in sodium by tasting it 4. _____ Certain laxatives my be high in sodium and  should be avoided unless prescribed   by a physician or pharmacist 5. _____ Salt substitutes may be used freely by anyone on a sodium restricted diet 6. _____ Sodium is present in table salt, food additives and as a natural component of   most foods 7. _____ Table salt is approximately 90%  sodium 8. _____ Limiting sodium intake may help prevent excess fluid accumulation in the body 9. _____ On a sodium-restricted diet, seasonings such as bouillon soy sauce, and    cooking wine should be used in place of table salt 10. _____ On an ingredient list, a product which lists monosodium glutamate as the first   ingredient is an appropriate food to include on a low sodium diet  Circle the best answer(s) to the following statements (Hint: there may be more than one correct answer)  11. On a low-sodium diet, some acceptable snack items are:    A. Olives  F. Bean dip   K. Grapefruit juice    B. Salted Pretzels G. Commercial Popcorn   L. Canned peaches    C. Carrot Sticks  H. Bouillon   M. Unsalted nuts   D. [place] fries  I. Peanut butter crackers N. Salami   E. Sweet pickles J. Tomato Juice   O. Pizza  12.  Seasonings that may be used freely on a reduced - sodium diet include   A. Lemon wedges F.Monosodium glutamate K. Celery seed    B.Soysauce   G. Pepper   L. Mustard powder   C. Sea salt  H. Cooking wine  M. Onion flakes   D. Vinegar  E. Prepared horseradish N. Salsa   E. Sage   J. Worcestershire sauce  O. [address]      Sumner Boast, PA-C  04/25/2019 9:23 AM    [facility] [place], [place], [place]  [address] Phone: [phone]; Fax: [phone]

## 2019-04-25 ENCOUNTER — Encounter: Payer: Self-pay | Admitting: Physician Assistant

## 2019-04-25 ENCOUNTER — Other Ambulatory Visit: Payer: Self-pay

## 2019-04-25 ENCOUNTER — Other Ambulatory Visit: Payer: Self-pay | Admitting: Endocrinology

## 2019-04-25 ENCOUNTER — Ambulatory Visit: Payer: PPO | Admitting: Physician Assistant

## 2019-04-25 VITALS — BP 144/88 | HR 74 | Ht 71.0 in | Wt 179.4 lb

## 2019-04-25 DIAGNOSIS — I10 Essential (primary) hypertension: Secondary | ICD-10-CM

## 2019-04-25 DIAGNOSIS — E785 Hyperlipidemia, unspecified: Secondary | ICD-10-CM | POA: Diagnosis not present

## 2019-04-25 DIAGNOSIS — I447 Left bundle-branch block, unspecified: Secondary | ICD-10-CM

## 2019-04-25 MED ORDER — LOSARTAN POTASSIUM 50 MG PO TABS: 50.0000 mg | ORAL_TABLET | Freq: Two times a day (BID) | ORAL | 3 refills | Status: DC

## 2019-04-25 NOTE — Patient Instructions (Signed)
Medication Instructions:  Your physician recommends that you continue on your current medications as directed. Please refer to the Current Medication list given to you today.  *If you need a refill on your cardiac medications before your next appointment, please call your pharmacy*   Lab Work: None  If you have labs (blood work) drawn today and your tests are completely normal, you will receive your results only by: Marland Kitchen MyChart Message (if you have MyChart) OR . A paper copy in the mail If you have any lab test that is abnormal or we need to change your treatment, we will call you to review the results.   Testing/Procedures: None   Follow-Up: At [facility], you and your health needs are our priority.  As part of our continuing mission to provide you with exceptional heart care, we have created designated Provider Care Teams.  These Care Teams include your primary Cardiologist (physician) and Advanced Practice Providers (APPs -  Physician Assistants and Nurse Practitioners) who all work together to provide you with the care you need, when you need it.  We recommend signing up for the patient portal called "MyChart".  Sign up information is provided on this After Visit Summary.  MyChart is used to connect with patients for Virtual Visits (Telemedicine).  Patients are able to view lab/test results, encounter notes, upcoming appointments, etc.  Non-urgent messages can be sent to your provider as well.   To learn more about what you can do with MyChart, go to [email or web address].    Your next appointment:   12 month(s)  The format for your next appointment:   In Person  Provider:   You may see Ena Dawley, MD or one of the following Advanced Practice Providers on your designated Care Team:    Melina Copa, PA-C  Ermalinda Barrios, PA-C    Other Instructions Your provider recommends that you maintain 150 minutes per week of moderate aerobic activity.  Two Gram Sodium Diet  2000 mg  What is Sodium? Sodium is a mineral found naturally in many foods. The most significant source of sodium in the diet is table salt, which is about 40% sodium.  Processed, convenience, and preserved foods also contain a large amount of sodium.  The body needs only 500 mg of sodium daily to function,  A normal diet provides more than enough sodium even if you do not use salt.  Why Limit Sodium? A build up of sodium in the body can cause thirst, increased blood pressure, shortness of breath, and water retention.  Decreasing sodium in the diet can reduce edema and risk of heart attack or stroke associated with high blood pressure.  Keep in mind that there are many other factors involved in these health problems.  Heredity, obesity, lack of exercise, cigarette smoking, stress and what you eat all play a role.  General Guidelines:  Do not add salt at the table or in cooking.  One teaspoon of salt contains over 2 grams of sodium.  Read food labels  Avoid processed and convenience foods  Ask your dietitian before eating any foods not dicussed in the menu planning guidelines  Consult your physician if you wish to use a salt substitute or a sodium containing medication such as antacids.  Limit milk and milk products to 16 oz (2 cups) per day.  Shopping Hints:  READ LABELS!! "Dietetic" does not necessarily mean low sodium.  Salt and other sodium ingredients are often added to foods during processing.   Menu  Planning Guidelines Food Group Choose More Often Avoid  Beverages (see also the milk group All fruit juices, low-sodium, salt-free vegetables juices, low-sodium carbonated beverages Regular vegetable or tomato juices, commercially softened water used for drinking or cooking  Breads and Cereals Enriched white, wheat, rye and pumpernickel bread, hard rolls and dinner rolls; muffins, cornbread and waffles; most dry cereals, cooked cereal without added salt; unsalted crackers and  breadsticks; low sodium or homemade bread crumbs Bread, rolls and crackers with salted tops; quick breads; instant hot cereals; pancakes; commercial bread stuffing; self-rising flower and biscuit mixes; regular bread crumbs or cracker crumbs  Desserts and Sweets Desserts and sweets mad with mild should be within allowance Instant pudding mixes and cake mixes  Fats Butter or margarine; vegetable oils; unsalted salad dressings, regular salad dressings limited to 1 Tbs; light, sour and heavy cream Regular salad dressings containing bacon fat, bacon bits, and salt pork; snack dips made with instant soup mixes or processed cheese; salted nuts  Fruits Most fresh, frozen and canned fruits Fruits processed with salt or sodium-containing ingredient (some dried fruits are processed with sodium sulfites        Vegetables Fresh, frozen vegetables and low- sodium canned vegetables Regular canned vegetables, sauerkraut, pickled vegetables, and others prepared in brine; frozen vegetables in sauces; vegetables seasoned with ham, bacon or salt pork  Condiments, Sauces, Miscellaneous  Salt substitute with physician's approval; pepper, herbs, spices; vinegar, lemon or lime juice; hot pepper sauce; garlic powder, onion powder, low sodium soy sauce (1 Tbs.); low sodium condiments (ketchup, chili sauce, mustard) in limited amounts (1 tsp.) fresh ground horseradish; unsalted tortilla chips, pretzels, potato chips, popcorn, salsa (1/4 cup) Any seasoning made with salt including garlic salt, celery salt, onion salt, and seasoned salt; sea salt, rock salt, kosher salt; meat tenderizers; monosodium glutamate; mustard, regular soy sauce, barbecue, sauce, chili sauce, teriyaki sauce, steak sauce, Worcestershire sauce, and most flavored vinegars; canned gravy and mixes; regular condiments; salted snack foods, olives, picles, relish, horseradish sauce, catsup   Food preparation: Try these seasonings Meats:    Pork Sage, onion  Serve with applesauce  Chicken Poultry seasoning, thyme, parsley Serve with cranberry sauce  Lamb Curry powder, rosemary, garlic, thyme Serve with mint sauce or jelly  Veal Marjoram, basil Serve with current jelly, cranberry sauce  Beef Pepper, bay leaf Serve with dry mustard, unsalted chive butter  Fish [place] leaf, dill Serve with unsalted lemon butter, unsalted parsley butter  Vegetables:    Asparagus Lemon juice   Broccoli Lemon juice   Carrots Mustard dressing parsley, mint, nutmeg, glazed with unsalted butter and sugar   Green beans Marjoram, lemon juice, nutmeg,dill seed   Tomatoes Basil, marjoram, onion   Spice /blend for [employer]" 4 tsp ground thyme 1 tsp ground sage 3 tsp ground rosemary 4 tsp ground marjoram   Test your knowledge 1. A product that says "Salt Free" may still contain sodium. True or False 2. Garlic Powder and Hot Pepper Sauce an be used as alternative seasonings.True or False 3. Processed foods have more sodium than fresh foods.  True or False 4. Canned Vegetables have less sodium than froze True or False  WAYS TO DECREASE YOUR SODIUM INTAKE 1. Avoid the use of added salt in cooking and at the table.  Table salt (and other prepared seasonings which contain salt) is probably one of the greatest sources of sodium in the diet.  Unsalted foods can gain flavor from the sweet, sour, and butter taste sensations  of herbs and spices.  Instead of using salt for seasoning, try the following seasonings with the foods listed.  Remember: how you use them to enhance natural food flavors is limited only by your creativity... Allspice-Meat, fish, eggs, fruit, peas, red and yellow vegetables Almond Extract-Fruit baked goods Anise Seed-Sweet breads, fruit, carrots, beets, cottage cheese, cookies (tastes like licorice) Basil-Meat, fish, eggs, vegetables, rice, vegetables salads, soups, sauces Bay Leaf-Meat, fish, stews, poultry Burnet-Salad, vegetables (cucumber-like  flavor) Caraway Seed-Bread, cookies, cottage cheese, meat, vegetables, cheese, rice Cardamon-Baked goods, fruit, soups Celery Powder or seed-Salads, salad dressings, sauces, meatloaf, soup, bread.Do not use  celery salt Chervil-Meats, salads, fish, eggs, vegetables, cottage cheese (parsley-like flavor) Chili Power-Meatloaf, chicken cheese, corn, eggplant, egg dishes Chives-Salads cottage cheese, egg dishes, soups, vegetables, sauces Cilantro-Salsa, casseroles Cinnamon-Baked goods, fruit, pork, lamb, chicken, carrots Cloves-Fruit, baked goods, fish, pot roast, green beans, beets, carrots Coriander-Pastry, cookies, meat, salads, cheese (lemon-orange flavor) Cumin-Meatloaf, fish,cheese, eggs, cabbage,fruit pie (caraway flavor) [employer], fruit, eggs, fish, poultry, cottage cheese, vegetables Dill Seed-Meat, cottage cheese, poultry, vegetables, fish, salads, bread Fennel Seed-Bread, cookies, apples, pork, eggs, fish, beets, cabbage, cheese, Licorice-like flavor Garlic-(buds or powder) Salads, meat, poultry, fish, bread, butter, vegetables, potatoes.Do not  use garlic salt Ginger-Fruit, vegetables, baked goods, meat, fish, poultry Horseradish Root-Meet, vegetables, butter Lemon Juice or Extract-Vegetables, fruit, tea, baked goods, fish salads Mace-Baked goods fruit, vegetables, fish, poultry (taste like nutmeg) Maple Extract-Syrups Marjoram-Meat, chicken, fish, vegetables, breads, green salads (taste like Sage) Mint-Tea, lamb, sherbet, vegetables, desserts, carrots, cabbage Mustard, Dry or Seed-Cheese, eggs, meats, vegetables, poultry Nutmeg-Baked goods, fruit, chicken, eggs, vegetables, desserts Onion Powder-Meat, fish, poultry, vegetables, cheese, eggs, bread, rice salads (Do not use   Onion salt) Orange Extract-Desserts, baked goods Oregano-Pasta, eggs, cheese, onions, pork, lamb, fish, chicken, vegetables, green salads Paprika-Meat, fish, poultry, eggs, cheese, vegetables Parsley  Flakes-Butter, vegetables, meat fish, poultry, eggs, bread, salads (certain forms may   Contain sodium Pepper-Meat fish, poultry, vegetables, eggs Peppermint Extract-Desserts, baked goods Poppy Seed-Eggs, bread, cheese, fruit dressings, baked goods, noodles, vegetables, cottage  [employer], poultry, meat, fish, cauliflower, turnips,eggs bread Saffron-Rice, bread, veal, chicken, fish, eggs Sage-Meat, fish, poultry, onions, eggplant, tomateos, pork, stews Savory-Eggs, salads, poultry, meat, rice, vegetables, soups, pork Tarragon-Meat, poultry, fish, eggs, butter, vegetables (licorice-like flavor)  Thyme-Meat, poultry, fish, eggs, vegetables, (clover-like flavor), sauces, soups Tumeric-Salads, butter, eggs, fish, rice, vegetables (saffron-like flavor) Vanilla Extract-Baked goods, candy Vinegar-Salads, vegetables, meat marinades Walnut Extract-baked goods, candy  2. Choose your Foods Wisely   The following is a list of foods to avoid which are high in sodium:  Meats-Avoid all smoked, canned, salt cured, dried and kosher meat and fish as well as Anchovies   [employer] meats:Bologna, Liverwurst, Pastrami Canned meat or fish  Marinated herring Caviar    Pepperoni Corned Beef   Pizza Dried chipped beef  Salami Frozen breaded fish or meat Salt pork Frankfurters or hot dogs  Sardines Gefilte fish   Sausage Ham (boiled ham, Proscuitto Smoked butt    spiced ham)   Spam      TV Dinners Vegetables Canned vegetables (Regular) Relish Canned mushrooms  Sauerkraut Olives    Tomato juice Pickles  Bakery and Dessert Products Canned puddings  Cream pies Cheesecake   Decorated cakes Cookies  Beverages/Juices Tomato juice, regular  Gatorade   V-8 vegetable juice, regular  Breads and Cereals Biscuit mixes   Salted potato chips, corn chips, pretzels Bread stuffing mixes  Salted crackers and rolls  Pancake and waffle mixes Self-rising  flour  Seasonings Accent    Meat sauces Barbecue sauce  Meat tenderizer Catsup    Monosodium glutamate (MSG) Celery salt   Onion salt Chili sauce   Prepared mustard Garlic salt   Salt, seasoned salt, sea salt Gravy mixes   Soy sauce Horseradish   Steak sauce Ketchup   Tartar sauce Lite salt    Teriyaki sauce Marinade mixes   Worcestershire sauce  Others Baking powder   Cocoa and cocoa mixes Baking soda   Commercial casserole mixes Candy-caramels, chocolate  Dehydrated soups    Bars, fudge,nougats  Instant rice and pasta mixes Canned broth or soup  Maraschino cherries Cheese, aged and processed cheese and cheese spreads  Learning Assessment Quiz  Indicated T (for True) or F (for False) for each of the following statements:  1. _____ Fresh fruits and vegetables and unprocessed grains are generally low in sodium 2. _____ Water may contain a considerable amount of sodium, depending on the source 3. _____ You can always tell if a food is high in sodium by tasting it 4. _____ Certain laxatives my be high in sodium and should be avoided unless prescribed   by a physician or pharmacist 5. _____ Salt substitutes may be used freely by anyone on a sodium restricted diet 6. _____ Sodium is present in table salt, food additives and as a natural component of   most foods 7. _____ Table salt is approximately 90% sodium 8. _____ Limiting sodium intake may help prevent excess fluid accumulation in the body 9. _____ On a sodium-restricted diet, seasonings such as bouillon soy sauce, and    cooking wine should be used in place of table salt 10. _____ On an ingredient list, a product which lists monosodium glutamate as the first   ingredient is an appropriate food to include on a low sodium diet  Circle the best answer(s) to the following statements (Hint: there may be more than one correct answer)  11. On a low-sodium diet, some acceptable snack items are:    A. Olives  F. Bean dip   K.  Grapefruit juice    B. Salted Pretzels G. Commercial Popcorn   L. Canned peaches    C. Carrot Sticks  H. Bouillon   M. Unsalted nuts   D. [place] fries  I. Peanut butter crackers N. Salami   E. Sweet pickles J. Tomato Juice   O. Pizza  12.  Seasonings that may be used freely on a reduced - sodium diet include   A. Lemon wedges F.Monosodium glutamate K. Celery seed    B.Soysauce   G. Pepper   L. Mustard powder   C. Sea salt  H. Cooking wine  M. Onion flakes   D. Vinegar  E. Prepared horseradish N. Salsa   E. Sage   J. Worcestershire sauce  O. Chutney

## 2019-05-01 DIAGNOSIS — R972 Elevated prostate specific antigen [PSA]: Secondary | ICD-10-CM | POA: Diagnosis not present

## 2019-05-05 DIAGNOSIS — R972 Elevated prostate specific antigen [PSA]: Secondary | ICD-10-CM | POA: Diagnosis not present

## 2019-05-09 ENCOUNTER — Other Ambulatory Visit: Payer: Self-pay | Admitting: *Deleted

## 2019-05-10 ENCOUNTER — Other Ambulatory Visit: Payer: Self-pay

## 2019-05-10 ENCOUNTER — Encounter: Payer: Self-pay | Admitting: Hematology & Oncology

## 2019-05-10 ENCOUNTER — Inpatient Hospital Stay: Payer: PPO | Attending: Hematology & Oncology

## 2019-05-10 ENCOUNTER — Inpatient Hospital Stay (HOSPITAL_BASED_OUTPATIENT_CLINIC_OR_DEPARTMENT_OTHER): Payer: PPO | Admitting: Hematology & Oncology

## 2019-05-10 LAB — COMPREHENSIVE METABOLIC PANEL
ALT: 13 U/L (ref 0–44)
AST: 21 U/L (ref 15–41)
Albumin: 4.4 g/dL (ref 3.5–5.0)
Alkaline Phosphatase: 65 U/L (ref 38–126)
Anion gap: 5 (ref 5–15)
BUN: 15 mg/dL (ref 8–23)
CO2: 30 mmol/L (ref 22–32)
Calcium: 9.9 mg/dL (ref 8.9–10.3)
Chloride: 103 mmol/L (ref 98–111)
Creatinine, Ser: 0.92 mg/dL (ref 0.61–1.24)
GFR calc Af Amer: >60 mL/min (ref >60)
GFR calc non Af Amer: >60 mL/min (ref >60)
Glucose, Bld: 158 mg/dL — ABNORMAL HIGH (ref 70–99)
Potassium: 4.3 mmol/L (ref 3.5–5.1)
Sodium: 138 mmol/L (ref 135–145)
Total Bilirubin: 1 mg/dL (ref 0.3–1.2)
Total Protein: 7.3 g/dL (ref 6.5–8.1)

## 2019-05-10 LAB — CBC WITH DIFFERENTIAL (CANCER CENTER ONLY)
Abs Immature Granulocytes: 0 10*3/uL (ref 0.00–0.07)
Basophils Absolute: 0 10*3/uL (ref 0.0–0.1)
Basophils Relative: 0 %
Eosinophils Absolute: 0.2 10*3/uL (ref 0.0–0.5)
Eosinophils Relative: 5 %
HCT: 42.8 % (ref 39.0–52.0)
Hemoglobin: 14.8 g/dL (ref 13.0–17.0)
Immature Granulocytes: 0 %
Lymphocytes Relative: 22 %
Lymphs Abs: 1 10*3/uL (ref 0.7–4.0)
MCH: 34 pg (ref 26.0–34.0)
MCHC: 34.6 g/dL (ref 30.0–36.0)
MCV: 98.4 fL (ref 80.0–100.0)
Monocytes Absolute: 0.5 10*3/uL (ref 0.1–1.0)
Monocytes Relative: 12 %
Neutro Abs: 2.8 10*3/uL (ref 1.7–7.7)
Neutrophils Relative %: 61 %
Platelet Count: 232 10*3/uL (ref 150–400)
RBC: 4.35 MIL/uL (ref 4.22–5.81)
RDW: 12.4 % (ref 11.5–15.5)
WBC Count: 4.5 10*3/uL (ref 4.0–10.5)
nRBC: 0 % (ref 0.0–0.2)

## 2019-05-10 NOTE — Progress Notes (Signed)
Hematology and Oncology Follow Up Visit  Preston Reeves [id number] 1940-06-12 79 y.o. 05/10/2019   Principle Diagnosis:   Hemochromatosis  Current Therapy:    Phlebotomy to maintain ferritin less than 100     Interim History:  Preston Reeves is back for follow-up.  Is now been over a year since we last saw him.  He has done incredibly well with the coronavirus.  He has had his vaccines.  Last year, he traveled.  He really did not let the coronavirus slow him down.  Recently they just got back from [place].  When we last saw him back in December 2019, his ferritin was 21 with iron saturation of 72%.  He has had no problems with bleeding.  I told him that he could certainly donate blood.  He has had no headache.  He has had no cough or shortness of breath.  He has had no rashes.  There is been no leg swelling.  He has had no change in bowel or bladder habits.    Overall, his performance status is ECOG 0   Medications:  Current Outpatient Medications:  .  Ascorbic Acid (VITAMIN C) 1000 MG tablet, Take 1,000 mg by mouth daily.  , Disp: , Rfl:  .  aspirin 81 MG tablet, Take 81 mg by mouth daily. , Disp: , Rfl:  .  Calcium Carbonate-Vitamin D (CALCIUM + D PO), Take 1 tablet by mouth daily. , Disp: , Rfl:  .  Cholecalciferol (VITAMIN D) 2000 UNITS CAPS, Take 2,000 Units by mouth daily. , Disp: , Rfl:  .  Docusate Sodium (COLACE PO), Take by mouth daily. , Disp: , Rfl:  .  fexofenadine-pseudoephedrine (ALLEGRA-D 24) 180-240 MG per 24 hr tablet, Take 1 tablet by mouth as needed. , Disp: , Rfl:  .  FIBER PO, Take by mouth daily. , Disp: , Rfl:  .  losartan (COZAAR) 50 MG tablet, Take 1 tablet (50 mg total) by mouth 2 (two) times daily., Disp: 180 tablet, Rfl: 3 .  metoprolol succinate (TOPROL-XL) 50 MG 24 hr tablet, Take 1 tablet (50 mg total) by mouth daily. Take with or immediately following a meal., Disp: 90 tablet, Rfl: 0 .  Multiple Vitamins-Minerals (MULTIVITAMIN PO), Take 1 tablet by  mouth daily. , Disp: , Rfl:  .  Omega-3 Fatty Acids (FISH OIL PO), Take 900 mg by mouth daily. Taking 900 daily, Disp: , Rfl:  .  Red Yeast Rice 600 MG CAPS, Take 1,200 mg by mouth daily. , Disp: , Rfl:  .  SALINE NASAL SPRAY NA, Place 1 spray into the nose daily as needed (allergies). , Disp: , Rfl:  .  SYNTHROID 150 MCG tablet, TAKE 1 TABLET BY MOUTH EVERY DAY, Disp: 90 tablet, Rfl: 1 .  vitamin B-12 (CYANOCOBALAMIN) 1000 MCG tablet, Take 1,000 mcg by mouth daily., Disp: , Rfl:   Allergies: No Known Allergies  Past Medical History, Surgical history, Social history, and Family History were reviewed and updated.  Review of Systems: Review of Systems  Constitutional: Negative.   HENT: Negative.   Eyes: Negative.   Respiratory: Negative.   Cardiovascular: Negative.   Gastrointestinal: Negative.   Genitourinary: Negative.   Musculoskeletal: Negative.   Skin: Negative.   Neurological: Negative.   Endo/Heme/Allergies: Negative.   Psychiatric/Behavioral: Negative.      Physical Exam:  height is 5\' 11"  (1.803 m) and weight is 180 lb 0.6 oz (81.7 kg). His temporal temperature is 97.1 F (36.2 C) (abnormal). His blood  pressure is 130/79 and his pulse is 68. His respiration is 18 and oxygen saturation is 99%.   Wt Readings from Last 3 Encounters:  05/10/19 180 lb 0.6 oz (81.7 kg)  04/25/19 179 lb 6.4 oz (81.4 kg)  12/05/18 180 lb (81.6 kg)     Physical Exam Vitals reviewed.  HENT:     Head: Normocephalic and atraumatic.  Eyes:     Pupils: Pupils are equal, round, and reactive to light.  Cardiovascular:     Rate and Rhythm: Normal rate and regular rhythm.     Heart sounds: Normal heart sounds.  Pulmonary:     Effort: Pulmonary effort is normal.     Breath sounds: Normal breath sounds.  Abdominal:     General: Bowel sounds are normal.     Palpations: Abdomen is soft.  Musculoskeletal:        General: No tenderness or deformity. Normal range of motion.     Cervical back:  Normal range of motion.  Lymphadenopathy:     Cervical: No cervical adenopathy.  Skin:    General: Skin is warm and dry.     Findings: No erythema or rash.  Neurological:     Mental Status: He is alert and oriented to person, place, and time.  Psychiatric:        Behavior: Behavior normal.        Thought Content: Thought content normal.        Judgment: Judgment normal.      Lab Results  Component Value Date   WBC 4.5 05/10/2019   HGB 14.8 05/10/2019   HCT 42.8 05/10/2019   MCV 98.4 05/10/2019   PLT 232 05/10/2019     Chemistry      Component Value Date/Time   NA 138 05/10/2019 1015   NA 141 03/11/2018 0842   NA 138 12/30/2015 1307   K 4.3 05/10/2019 1015   K 4.1 12/30/2015 1307   CL 103 05/10/2019 1015   CO2 30 05/10/2019 1015   CO2 24 12/30/2015 1307   BUN 15 05/10/2019 1015   BUN 15 03/11/2018 0842   BUN 17.4 12/30/2015 1307   CREATININE 0.92 05/10/2019 1015   CREATININE 0.98 12/27/2017 1005   CREATININE 0.9 12/30/2015 1307      Component Value Date/Time   CALCIUM 9.9 05/10/2019 1015   CALCIUM 9.6 12/30/2015 1307   ALKPHOS 65 05/10/2019 1015   ALKPHOS 69 12/30/2015 1307   AST 21 05/10/2019 1015   AST 18 12/27/2017 1005   AST 23 12/30/2015 1307   ALT 13 05/10/2019 1015   ALT 12 12/27/2017 1005   ALT 15 12/30/2015 1307   BILITOT 1.0 05/10/2019 1015   BILITOT 0.9 12/27/2017 1005   BILITOT 0.75 12/30/2015 1307         Impression and Plan: Preston Reeves is a 79 year old gentleman with hemochromatosis.  Everything is going quite well for him.  I am just very happy that he is doing well.  I will have to see what his iron saturation is.  This might be helpful for [place].  We will see him back in another year.  I think yearly follow-up is reasonable.  [name], MD 4/14/202111:08 AM

## 2019-05-19 ENCOUNTER — Other Ambulatory Visit: Payer: Self-pay | Admitting: Hematology & Oncology

## 2019-07-12 DIAGNOSIS — H40021 Open angle with borderline findings, high risk, right eye: Secondary | ICD-10-CM | POA: Diagnosis not present

## 2019-07-12 DIAGNOSIS — H47021 Hemorrhage in optic nerve sheath, right eye: Secondary | ICD-10-CM | POA: Diagnosis not present

## 2019-07-12 DIAGNOSIS — H40012 Open angle with borderline findings, low risk, left eye: Secondary | ICD-10-CM | POA: Diagnosis not present

## 2019-08-19 ENCOUNTER — Other Ambulatory Visit: Payer: Self-pay | Admitting: Hematology & Oncology

## 2019-08-23 DIAGNOSIS — H47021 Hemorrhage in optic nerve sheath, right eye: Secondary | ICD-10-CM | POA: Diagnosis not present

## 2019-09-29 DIAGNOSIS — H47021 Hemorrhage in optic nerve sheath, right eye: Secondary | ICD-10-CM | POA: Diagnosis not present

## 2019-10-21 ENCOUNTER — Other Ambulatory Visit: Payer: Self-pay | Admitting: Endocrinology

## 2019-11-03 DIAGNOSIS — H25043 Posterior subcapsular polar age-related cataract, bilateral: Secondary | ICD-10-CM | POA: Diagnosis not present

## 2019-11-03 DIAGNOSIS — H2512 Age-related nuclear cataract, left eye: Secondary | ICD-10-CM | POA: Diagnosis not present

## 2019-11-03 DIAGNOSIS — H18593 Other hereditary corneal dystrophies, bilateral: Secondary | ICD-10-CM | POA: Diagnosis not present

## 2019-11-03 DIAGNOSIS — H25013 Cortical age-related cataract, bilateral: Secondary | ICD-10-CM | POA: Diagnosis not present

## 2019-11-03 DIAGNOSIS — H18413 Arcus senilis, bilateral: Secondary | ICD-10-CM | POA: Diagnosis not present

## 2019-11-03 DIAGNOSIS — H2513 Age-related nuclear cataract, bilateral: Secondary | ICD-10-CM | POA: Diagnosis not present

## 2019-11-08 DIAGNOSIS — H2512 Age-related nuclear cataract, left eye: Secondary | ICD-10-CM | POA: Diagnosis not present

## 2019-11-09 DIAGNOSIS — H2511 Age-related nuclear cataract, right eye: Secondary | ICD-10-CM | POA: Diagnosis not present

## 2019-11-15 DIAGNOSIS — R972 Elevated prostate specific antigen [PSA]: Secondary | ICD-10-CM | POA: Diagnosis not present

## 2019-11-17 ENCOUNTER — Other Ambulatory Visit: Payer: Self-pay | Admitting: Hematology & Oncology

## 2019-11-20 DIAGNOSIS — N400 Enlarged prostate without lower urinary tract symptoms: Secondary | ICD-10-CM | POA: Diagnosis not present

## 2019-11-20 DIAGNOSIS — R972 Elevated prostate specific antigen [PSA]: Secondary | ICD-10-CM | POA: Diagnosis not present

## 2019-11-22 DIAGNOSIS — H2511 Age-related nuclear cataract, right eye: Secondary | ICD-10-CM | POA: Diagnosis not present

## 2019-12-12 ENCOUNTER — Telehealth: Payer: Self-pay | Admitting: Internal Medicine

## 2019-12-12 NOTE — Patient Instructions (Addendum)
Blood work was ordered.    All other Health Maintenance issues reviewed.   All recommended immunizations and age-appropriate screenings are up-to-date or discussed.  FLu immunization administered today.   Medications reviewed and updated.  Changes include :   none    Please followup in 1 year    Health Maintenance, Male Adopting a healthy lifestyle and getting preventive care are important in promoting health and wellness. Ask your health care provider about:  The right schedule for you to have regular tests and exams.  Things you can do on your own to prevent diseases and keep yourself healthy. What should I know about diet, weight, and exercise? Eat a healthy diet   Eat a diet that includes plenty of vegetables, fruits, low-fat dairy products, and lean protein.  Do not eat a lot of foods that are high in solid fats, added sugars, or sodium. Maintain a healthy weight Body mass index (BMI) is a measurement that can be used to identify possible weight problems. It estimates body fat based on height and weight. Your health care provider can help determine your BMI and help you achieve or maintain a healthy weight. Get regular exercise Get regular exercise. This is one of the most important things you can do for your health. Most adults should:  Exercise for at least 150 minutes each week. The exercise should increase your heart rate and make you sweat (moderate-intensity exercise).  Do strengthening exercises at least twice a week. This is in addition to the moderate-intensity exercise.  Spend less time sitting. Even light physical activity can be beneficial. Watch cholesterol and blood lipids Have your blood tested for lipids and cholesterol at 79 years of age, then have this test every 5 years. You may need to have your cholesterol levels checked more often if:  Your lipid or cholesterol levels are high.  You are older than 79 years of age.  You are at high risk for  heart disease. What should I know about cancer screening? Many types of cancers can be detected early and may often be prevented. Depending on your health history and family history, you may need to have cancer screening at various ages. This may include screening for:  Colorectal cancer.  Prostate cancer.  Skin cancer.  Lung cancer. What should I know about heart disease, diabetes, and high blood pressure? Blood pressure and heart disease  High blood pressure causes heart disease and increases the risk of stroke. This is more likely to develop in people who have high blood pressure readings, are of African descent, or are overweight.  Talk with your health care provider about your target blood pressure readings.  Have your blood pressure checked: ? Every 3-5 years if you are 57-36 years of age. ? Every year if you are 67 years old or older.  If you are between the ages of 7 and 28 and are a current or former smoker, ask your health care provider if you should have a one-time screening for abdominal aortic aneurysm (AAA). Diabetes Have regular diabetes screenings. This checks your fasting blood sugar level. Have the screening done:  Once every three years after age 31 if you are at a normal weight and have a low risk for diabetes.  More often and at a younger age if you are overweight or have a high risk for diabetes. What should I know about preventing infection? Hepatitis B If you have a higher risk for hepatitis B, you should be screened for  this virus. Talk with your health care provider to find out if you are at risk for hepatitis B infection. Hepatitis C Blood testing is recommended for:  Everyone born from 1945 through 1965.  Anyone with known risk factors for hepatitis C. Sexually transmitted infections (STIs)  You should be screened each year for STIs, including gonorrhea and chlamydia, if: ? You are sexually active and are younger than 79 years of age. ? You are  older than 79 years of age and your health care provider tells you that you are at risk for this type of infection. ? Your sexual activity has changed since you were last screened, and you are at increased risk for chlamydia or gonorrhea. Ask your health care provider if you are at risk.  Ask your health care provider about whether you are at high risk for HIV. Your health care provider may recommend a prescription medicine to help prevent HIV infection. If you choose to take medicine to prevent HIV, you should first get tested for HIV. You should then be tested every 3 months for as long as you are taking the medicine. Follow these instructions at home: Lifestyle  Do not use any products that contain nicotine or tobacco, such as cigarettes, e-cigarettes, and chewing tobacco. If you need help quitting, ask your health care provider.  Do not use street drugs.  Do not share needles.  Ask your health care provider for help if you need support or information about quitting drugs. Alcohol use  Do not drink alcohol if your health care provider tells you not to drink.  If you drink alcohol: ? Limit how much you have to 0-2 drinks a day. ? Be aware of how much alcohol is in your drink. In the [place] one drink equals one 12 oz bottle of beer (355 mL), one 5 oz glass of wine (148 mL), or one 1 oz glass of hard liquor (44 mL). General instructions  Schedule regular health, dental, and eye exams.  Stay current with your vaccines.  Tell your health care provider if: ? You often feel depressed. ? You have ever been abused or do not feel safe at home. Summary  Adopting a healthy lifestyle and getting preventive care are important in promoting health and wellness.  Follow your health care provider's instructions about healthy diet, exercising, and getting tested or screened for diseases.  Follow your health care provider's instructions on monitoring your cholesterol and blood pressure. This  information is not intended to replace advice given to you by your health care provider. Make sure you discuss any questions you have with your health care provider. Document Revised: 01/05/2018 Document Reviewed: 01/05/2018 Elsevier Patient Education  2020 [employer]vier Inc.  

## 2019-12-12 NOTE — Telephone Encounter (Signed)
LVM for pt to rtn my call to schedule AWV with [facility]. Please schedule this appt if pt calls the office    Thanks, [phone] 

## 2019-12-12 NOTE — Progress Notes (Signed)
Subjective:    Patient ID: Preston Reeves, male    DOB: December 27, 1940, 79 y.o.   MRN: [id number]  HPI He is here for a physical exam.   When he sleeps at night on his right side his right fingers get painful  - shaking them out relieves this.  He denies these symptoms during the day.  Medications and allergies reviewed with patient and updated if appropriate.  Patient Active Problem List   Diagnosis Date Noted  . Hyperglycemia 12/04/2018  . Diverticulitis of colon 10/09/2015  . Elevated PSA 10/09/2015  . PVC's (premature ventricular contractions) 07/25/2014  . Essential hypertension 07/25/2014  . Incisional hernias s/p lap/open VWH repair w mesh 06/20/2013  . Hypothyroidism 12/28/2012  . MVP (mitral valve prolapse)   . Diverticulosis   . History of BPH   . Hemochromatosis   . COPD (chronic obstructive pulmonary disease) ([place])     Current Outpatient Medications on File Prior to Visit  Medication Sig Dispense Refill  . Ascorbic Acid (VITAMIN C) 1000 MG tablet Take 1,000 mg by mouth daily.      Marland Kitchen aspirin 81 MG tablet Take 81 mg by mouth daily.     Marland Kitchen BESIVANCE 0.6 % SUSP Place 1 drop into the left eye 3 (three) times daily.    . Calcium Carbonate-Vitamin D (CALCIUM + D PO) Take 1 tablet by mouth daily.     . Cholecalciferol (VITAMIN D) 2000 UNITS CAPS Take 2,000 Units by mouth daily.     Mariane Baumgarten Sodium (COLACE PO) Take by mouth daily.     . DUREZOL 0.05 % EMUL Place 1 drop into the left eye 3 (three) times daily.    . fexofenadine-pseudoephedrine (ALLEGRA-D 24) 180-240 MG per 24 hr tablet Take 1 tablet by mouth as needed.     Marland Kitchen FIBER PO Take by mouth daily.     Marland Kitchen losartan (COZAAR) 50 MG tablet Take 1 tablet (50 mg total) by mouth 2 (two) times daily. 180 tablet 3  . metoprolol succinate (TOPROL-XL) 50 MG 24 hr tablet TAKE 1 TABLET(50 MG) BY MOUTH DAILY WITH OR IMMEDIATELY FOLLOWING A MEAL 90 tablet 0  . Multiple Vitamins-Minerals (MULTIVITAMIN PO) Take 1 tablet by mouth daily.      . Omega-3 Fatty Acids (FISH OIL PO) Take 900 mg by mouth daily. Taking 900 daily    . PROLENSA 0.07 % SOLN Place 1 drop into the left eye at bedtime.    . Red Yeast Rice 600 MG CAPS Take 1,200 mg by mouth daily.     Marland Kitchen SALINE NASAL SPRAY NA Place 1 spray into the nose daily as needed (allergies).     . SYNTHROID 150 MCG tablet TAKE 1 TABLET BY MOUTH EVERY DAY 90 tablet 1  . vitamin B-12 (CYANOCOBALAMIN) 1000 MCG tablet Take 1,000 mcg by mouth daily.     No current facility-administered medications on file prior to visit.    Past Medical History:  Diagnosis Date  . Allergy    SEASONAL  . Asthma   . Cataract   . Colon polyps    Adenomatous  . COPD (chronic obstructive pulmonary disease) ([place])   . Diverticulitis of colon with perforation 2010   Colectomy/ostomy  . Diverticulosis    hx of  . Hemochromatosis    hx of  . History of BPH   . History of COPD   . Hyperlipidemia   . Hypothyroidism   . Incisional hernia   . MVP (mitral  valve prolapse)   . Thyroid disease     Past Surgical History:  Procedure Laterality Date  . COLONOSCOPY  last 09/25/2013  . COLONOSCOPY W/ BIOPSIES  2011   Dr Henrene Pastor  . COLOSTOMY TAKEDOWN  2010   Dr Barry Dienes  . INGUINAL HERNIA REPAIR Left 1999   Dr. Rebekah Chesterfield  . INSERTION OF MESH N/A 10/13/2013   Procedure: INSERTION OF MESH;  Surgeon: Michael Boston, MD;  Location: [place];  Service: General;  Laterality: N/A;  . LAPAROSCOPIC LYSIS OF ADHESIONS N/A 10/13/2013   Procedure: LAPAROSCOPIC LYSIS OF ADHESIONS;  Surgeon: Michael Boston, MD;  Location: [place];  Service: General;  Laterality: N/A;  . PARTIAL COLECTOMY  08/12/2008   [facility]mann; perforated colon from diverticulitis  . UPPER GI ENDOSCOPY    . VENTRAL HERNIA REPAIR N/A 10/13/2013   Procedure: REPAIR OF VENTRAL WALL HERNIA WITH UNILATERAL COMPARTMENT SEPARATION;  Surgeon: Michael Boston, MD;  Location: [place] [facility];  Service: General;  Laterality: N/A;    Social History   Socioeconomic History  . Marital  status: Married    Spouse name: Not on file  . Number of children: Not on file  . Years of education: Not on file  . Highest education level: Not on file  Occupational History  . Occupation: Retired    Fish farm manager: RETIRED  Tobacco Use  . Smoking status: Former Smoker    Types: Cigarettes    Quit date: 2005    Years since quitting: 16.8  . Smokeless tobacco: Never Used  Vaping Use  . Vaping Use: Never used  Substance and Sexual Activity  . Alcohol use: Yes    Alcohol/week: 7.0 standard drinks    Types: 7 Cans of beer per week    Comment: drinks beer weekly  . Drug use: No  . Sexual activity: Yes  Other Topics Concern  . Not on file  Social History Narrative   Married. Education: [employer].   Social Determinants of Health   Financial Resource Strain:   . Difficulty of Paying Living Expenses: Not on file  Food Insecurity:   . Worried About Charity fundraiser in the Last Year: Not on file  . Ran Out of Food in the Last Year: Not on file  Transportation Needs:   . Lack of Transportation (Medical): Not on file  . Lack of Transportation (Non-Medical): Not on file  Physical Activity:   . Days of Exercise per Week: Not on file  . Minutes of Exercise per Session: Not on file  Stress:   . Feeling of Stress : Not on file  Social Connections:   . Frequency of Communication with Friends and Family: Not on file  . Frequency of Social Gatherings with Friends and Family: Not on file  . Attends Religious Services: Not on file  . Active Member of Clubs or Organizations: Not on file  . Attends Archivist Meetings: Not on file  . Marital Status: Not on file    Family History  Problem Relation Age of Onset  . Breast cancer Mother   . Colon cancer Father        62's  . Heart disease Father   . Diabetes Father   . Heart attack Father   . Stroke Neg Hx   . Esophageal cancer Neg Hx   . Rectal cancer Neg Hx   . Stomach cancer Neg Hx   . Colon polyps Neg Hx     Review of  Systems  Constitutional: Negative for  chills and fever.  Eyes: Negative for visual disturbance.  Respiratory: Positive for shortness of breath. Negative for cough and wheezing.   Cardiovascular: Negative for chest pain, palpitations and leg swelling.  Gastrointestinal: Negative for abdominal pain, blood in stool, constipation, diarrhea and nausea.       No gerd  Genitourinary: Negative for difficulty urinating, dysuria and hematuria.  Musculoskeletal: Negative for arthralgias and back pain.  Skin: Negative for color change and rash.  Neurological: Negative for dizziness, light-headedness and headaches.  Psychiatric/Behavioral: Negative for dysphoric mood. The patient is not nervous/anxious.        Objective:   Vitals:   12/13/19 1028  BP: (!) 142/82  Pulse: 66  Temp: 98.1 F (36.7 C)  SpO2: 93%   Filed Weights   12/13/19 1028  Weight: 183 lb 6.4 oz (83.2 kg)   Body mass index is 25.58 kg/m.  BP Readings from Last 3 Encounters:  12/13/19 (!) 142/82  05/10/19 130/79  04/25/19 (!) 144/88    Wt Readings from Last 3 Encounters:  12/13/19 183 lb 6.4 oz (83.2 kg)  05/10/19 180 lb 0.6 oz (81.7 kg)  04/25/19 179 lb 6.4 oz (81.4 kg)     Physical Exam Constitutional: He appears well-developed and well-nourished. No distress.  HENT:  Head: Normocephalic and atraumatic.  Right Ear: External ear normal.  Left Ear: External ear normal.  Mouth/Throat: Oropharynx is clear and moist.  Normal ear canals and TM b/l  Eyes: Conjunctivae and EOM are normal.  Neck: Neck supple. No tracheal deviation present. No thyromegaly present.  No carotid bruit  Cardiovascular: Normal rate, regular rhythm, normal heart sounds and intact distal pulses.   No murmur heard. Pulmonary/Chest: Effort normal and breath sounds normal. No respiratory distress. He has no wheezes. He has no rales.  Abdominal: Soft. He exhibits no distension. There is no tenderness.  Genitourinary: deferred    Musculoskeletal: He exhibits no edema.  Lymphadenopathy:   He has no cervical adenopathy.  Skin: Skin is warm and dry. He is not diaphoretic.  Psychiatric: He has a normal mood and affect. His behavior is normal.         Assessment & Plan:   Physical exam: Screening blood work  ordered Immunizations  Flu vaccine today, discussed shingrix, had covid vaccines Colonoscopy   Up to date  Eye exams   Up to date  Exercise   Golf once per week, encouraged regular walking Weight  normal Substance abuse   none  See Problem List for Assessment and Plan of chronic medical problems.   This visit occurred during the SARS-CoV-2 public health emergency.  Safety protocols were in place, including screening questions prior to the visit, additional usage of staff PPE, and extensive cleaning of exam room while observing appropriate contact time as indicated for disinfecting solutions.

## 2019-12-13 ENCOUNTER — Other Ambulatory Visit: Payer: Self-pay

## 2019-12-13 ENCOUNTER — Encounter: Payer: Self-pay | Admitting: Internal Medicine

## 2019-12-13 ENCOUNTER — Ambulatory Visit (INDEPENDENT_AMBULATORY_CARE_PROVIDER_SITE_OTHER): Payer: PPO | Admitting: Internal Medicine

## 2019-12-13 VITALS — BP 142/82 | HR 66 | Temp 98.1°F | Ht 71.0 in | Wt 183.4 lb

## 2019-12-13 DIAGNOSIS — I10 Essential (primary) hypertension: Secondary | ICD-10-CM | POA: Diagnosis not present

## 2019-12-13 DIAGNOSIS — Z0000 Encounter for general adult medical examination without abnormal findings: Secondary | ICD-10-CM

## 2019-12-13 DIAGNOSIS — E039 Hypothyroidism, unspecified: Secondary | ICD-10-CM

## 2019-12-13 DIAGNOSIS — Z23 Encounter for immunization: Secondary | ICD-10-CM | POA: Diagnosis not present

## 2019-12-13 DIAGNOSIS — J449 Chronic obstructive pulmonary disease, unspecified: Secondary | ICD-10-CM

## 2019-12-13 DIAGNOSIS — R739 Hyperglycemia, unspecified: Secondary | ICD-10-CM

## 2019-12-13 LAB — CBC WITH DIFFERENTIAL/PLATELET
Basophils Absolute: 0 10*3/uL (ref 0.0–0.1)
Basophils Relative: 0.7 % (ref 0.0–3.0)
Eosinophils Absolute: 0.2 10*3/uL (ref 0.0–0.7)
Eosinophils Relative: 4.6 % (ref 0.0–5.0)
HCT: 40.2 % (ref 39.0–52.0)
Hemoglobin: 14 g/dL (ref 13.0–17.0)
Lymphocytes Relative: 21.4 % (ref 12.0–46.0)
Lymphs Abs: 0.8 10*3/uL (ref 0.7–4.0)
MCHC: 34.9 g/dL (ref 30.0–36.0)
MCV: 100.6 fl — ABNORMAL HIGH (ref 78.0–100.0)
Monocytes Absolute: 0.5 10*3/uL (ref 0.1–1.0)
Monocytes Relative: 12.9 % — ABNORMAL HIGH (ref 3.0–12.0)
Neutro Abs: 2.2 10*3/uL (ref 1.4–7.7)
Neutrophils Relative %: 60.4 % (ref 43.0–77.0)
Platelets: 218 10*3/uL (ref 150.0–400.0)
RBC: 4 Mil/uL — ABNORMAL LOW (ref 4.22–5.81)
RDW: 13.5 % (ref 11.5–15.5)
WBC: 3.7 10*3/uL — ABNORMAL LOW (ref 4.0–10.5)

## 2019-12-13 LAB — COMPREHENSIVE METABOLIC PANEL
ALT: 18 U/L (ref 0–53)
AST: 25 U/L (ref 0–37)
Albumin: 4 g/dL (ref 3.5–5.2)
Alkaline Phosphatase: 61 U/L (ref 39–117)
BUN: 13 mg/dL (ref 6–23)
CO2: 32 mEq/L (ref 19–32)
Calcium: 9.5 mg/dL (ref 8.4–10.5)
Chloride: 103 mEq/L (ref 96–112)
Creatinine, Ser: 0.85 mg/dL (ref 0.40–1.50)
GFR: 82.48 mL/min (ref >60.00)
Glucose, Bld: 106 mg/dL — ABNORMAL HIGH (ref 70–99)
Potassium: 4.1 mEq/L (ref 3.5–5.1)
Sodium: 139 mEq/L (ref 135–145)
Total Bilirubin: 0.8 mg/dL (ref 0.2–1.2)
Total Protein: 7.4 g/dL (ref 6.0–8.3)

## 2019-12-13 LAB — LIPID PANEL
Cholesterol: 125 mg/dL (ref 0–200)
HDL: 45.6 mg/dL (ref >39.00)
LDL Cholesterol: 68 mg/dL (ref 0–99)
NonHDL: 79.48
Total CHOL/HDL Ratio: 3
Triglycerides: 59 mg/dL (ref 0.0–149.0)
VLDL: 11.8 mg/dL (ref 0.0–40.0)

## 2019-12-13 LAB — HEMOGLOBIN A1C: Hgb A1c MFr Bld: 5.5 % (ref 4.6–6.5)

## 2019-12-13 NOTE — Addendum Note (Signed)
Addended by: Marcina Millard on: 12/13/2019 01:39 PM   Modules accepted: Orders

## 2019-12-13 NOTE — Assessment & Plan Note (Signed)
Chronic Does have some shortness of breath with exertion.  He plays golf once a week-encouraged regular walking-some of the shortness of breath could be related to deconditioning. Shortness of breath is mild and no inhaler is necessary at this time

## 2019-12-13 NOTE — Assessment & Plan Note (Signed)
Chronic Check a1c Low sugar / carb diet Stressed regular exercise  

## 2019-12-13 NOTE — Assessment & Plan Note (Signed)
Chronic Following with Dr. Clydene Laming per him Clinically euthyroid

## 2019-12-13 NOTE — Assessment & Plan Note (Signed)
Chronic BP well controlled Continue losartan 50 mg twice daily, metoprolol 50 mg daily cmp

## 2019-12-13 NOTE — Assessment & Plan Note (Signed)
Chronic Monitored by Dr. Marin Olp

## 2019-12-28 ENCOUNTER — Other Ambulatory Visit: Payer: Self-pay

## 2019-12-28 ENCOUNTER — Other Ambulatory Visit (INDEPENDENT_AMBULATORY_CARE_PROVIDER_SITE_OTHER): Payer: PPO

## 2019-12-28 ENCOUNTER — Ambulatory Visit: Payer: PPO | Admitting: Endocrinology

## 2019-12-28 DIAGNOSIS — E890 Postprocedural hypothyroidism: Secondary | ICD-10-CM | POA: Diagnosis not present

## 2019-12-28 LAB — T4, FREE: Free T4: 1.27 ng/dL (ref 0.60–1.60)

## 2019-12-28 LAB — TSH: TSH: 1.61 u[IU]/mL (ref 0.35–4.50)

## 2020-01-01 ENCOUNTER — Ambulatory Visit: Payer: PPO | Admitting: Endocrinology

## 2020-01-03 DIAGNOSIS — H40022 Open angle with borderline findings, high risk, left eye: Secondary | ICD-10-CM | POA: Diagnosis not present

## 2020-01-03 DIAGNOSIS — H401111 Primary open-angle glaucoma, right eye, mild stage: Secondary | ICD-10-CM | POA: Diagnosis not present

## 2020-01-04 ENCOUNTER — Ambulatory Visit: Payer: PPO | Admitting: Endocrinology

## 2020-01-04 ENCOUNTER — Other Ambulatory Visit: Payer: Self-pay

## 2020-01-04 VITALS — BP 156/90 | HR 82 | Ht 69.75 in | Wt 181.0 lb

## 2020-01-04 DIAGNOSIS — E890 Postprocedural hypothyroidism: Secondary | ICD-10-CM | POA: Diagnosis not present

## 2020-01-04 NOTE — Progress Notes (Signed)
Reason for Appointment:  Hypothyroidism, followup visit      History of Present Illness:   The hypothyroidism was first diagnosed in 2007 after radioactive iodine treatment for Graves' disease   The patient has been treated with  Levoxyl, generic levothyroxine and subsequently brand name Synthroid  His last dose change was in 11/15 and at that time his TSH was slightly increased at 4.7 and his dose was increased to 150 g He thinks he may have felt a little less fatigue with this change   He takes brand name Synthroid and gets the prescription from [facility] as before      Recently has felt well with no fatigue, sluggishness Also low shakiness or heat or cold intolerance His weight is down slightly as of about 3 to 4 weeks ago but he was trying to get it down  He is taking the thyroid supplement consistently in the morning before breakfast at least 30 minutes before eating He is taking multivitamins after breakfast  TSH has been consistently normal now   Lab Results  Component Value Date   TSH 1.61 12/28/2019   TSH 0.87 12/26/2018   TSH 1.55 12/05/2018   FREET4 1.27 12/28/2019   FREET4 1.44 12/26/2018   FREET4 1.28 06/22/2018    Wt Readings from Last 3 Encounters:  01/04/20 181 lb (82.1 kg)  12/13/19 183 lb 6.4 oz (83.2 kg)  05/10/19 180 lb 0.6 oz (81.7 kg)    Allergies as of 01/04/2020   No Known Allergies     Medication List       Accurate as of January 04, 2020  9:00 AM. If you have any questions, ask your nurse or doctor.        aspirin 81 MG tablet Take 81 mg by mouth daily.   Besivance 0.6 % Susp Generic drug: Besifloxacin HCl Place 1 drop into the left eye 3 (three) times daily.   CALCIUM + D PO Take 1 tablet by mouth daily.   COLACE PO Take by mouth daily.   Durezol 0.05 % Emul Generic drug: Difluprednate Place 1 drop into the left eye 3 (three) times daily.   fexofenadine-pseudoephedrine 180-240 MG 24 hr tablet Commonly known  as: ALLEGRA-D 24 Take 1 tablet by mouth as needed.   FIBER PO Take by mouth daily.   FISH OIL PO Take 900 mg by mouth daily. Taking 900 daily   losartan 50 MG tablet Commonly known as: COZAAR Take 1 tablet (50 mg total) by mouth 2 (two) times daily.   metoprolol succinate 50 MG 24 hr tablet Commonly known as: TOPROL-XL TAKE 1 TABLET(50 MG) BY MOUTH DAILY WITH OR IMMEDIATELY FOLLOWING A MEAL   MULTIVITAMIN PO Take 1 tablet by mouth daily.   Prolensa 0.07 % Soln Generic drug: Bromfenac Sodium Place 1 drop into the left eye at bedtime.   Red Yeast Rice 600 MG Caps Take 1,200 mg by mouth daily.   SALINE NASAL SPRAY NA Place 1 spray into the nose daily as needed (allergies).   Synthroid 150 MCG tablet Generic drug: levothyroxine TAKE 1 TABLET BY MOUTH EVERY DAY   vitamin B-12 1000 MCG tablet Commonly known as: CYANOCOBALAMIN Take 1,000 mcg by mouth daily.   vitamin C 1000 MG tablet Take 1,000 mg by mouth daily.   Vitamin D 50 MCG (2000 UT) Caps Take 2,000 Units by mouth daily.       Allergies: No Known Allergies  Past Medical History:  Diagnosis Date  .  Allergy    SEASONAL  . Asthma   . Cataract   . Colon polyps    Adenomatous  . COPD (chronic obstructive pulmonary disease) ([place])   . Diverticulitis of colon with perforation 2010   Colectomy/ostomy  . Diverticulosis    hx of  . Hemochromatosis    hx of  . History of BPH   . History of COPD   . Hyperlipidemia   . Hypothyroidism   . Incisional hernia   . MVP (mitral valve prolapse)   . Thyroid disease     Past Surgical History:  Procedure Laterality Date  . COLONOSCOPY  last 09/25/2013  . COLONOSCOPY W/ BIOPSIES  2011   Dr Henrene Pastor  . COLOSTOMY TAKEDOWN  2010   Dr Barry Dienes  . INGUINAL HERNIA REPAIR Left 1999   Dr. Rebekah Chesterfield  . INSERTION OF MESH N/A 10/13/2013   Procedure: INSERTION OF MESH;  Surgeon: Michael Boston, MD;  Location: [place];  Service: General;  Laterality: N/A;  . LAPAROSCOPIC LYSIS OF  ADHESIONS N/A 10/13/2013   Procedure: LAPAROSCOPIC LYSIS OF ADHESIONS;  Surgeon: Michael Boston, MD;  Location: [place];  Service: General;  Laterality: N/A;  . PARTIAL COLECTOMY  08/12/2008   [facility]mann; perforated colon from diverticulitis  . UPPER GI ENDOSCOPY    . VENTRAL HERNIA REPAIR N/A 10/13/2013   Procedure: REPAIR OF VENTRAL WALL HERNIA WITH UNILATERAL COMPARTMENT SEPARATION;  Surgeon: Michael Boston, MD;  Location: [place] [facility];  Service: General;  Laterality: N/A;    Family History  Problem Relation Age of Onset  . Breast cancer Mother   . Colon cancer Father        59's  . Heart disease Father   . Diabetes Father   . Heart attack Father   . Stroke Neg Hx   . Esophageal cancer Neg Hx   . Rectal cancer Neg Hx   . Stomach cancer Neg Hx   . Colon polyps Neg Hx     Social History:  reports that he quit smoking about 16 years ago. His smoking use included cigarettes. He has never used smokeless tobacco. He reports current alcohol use of about 7.0 standard drinks of alcohol per week. He reports that he does not use drugs.   ROS  Hypertension followed by other physicians  Home BP 130 usually, he thinks he has whitecoat syndrome  BP Readings from Last 3 Encounters:  01/04/20 (!) 156/90  12/13/19 (!) 142/82  05/10/19 130/79     Examination:   BP (!) 156/90   Pulse 82   Ht 5' 9.75" (1.772 m)   Wt 181 lb (82.1 kg)   SpO2 94%   BMI 26.16 kg/m   Thyroid nonpalpable Positive reflexes are normal    Assessment   Hypothyroidism, post ablative treated with brand name Synthroid 150 g daily  No symptoms of fatigue  His dose has not been changed and is stable with using brand-name Synthroid  He is very consistent with taking his Synthroid before breakfast daily  Since TSH is stable in the normal range no change will be made    Plan: We will discuss with PCP if she will continue to follow his hypothyroidism otherwise he will be seen in a year  Reminded him to check his  blood pressure regularly at home and call his cardiologist if starting to go up  Elayne Snare 01/04/2020, 9:00 AM

## 2020-01-27 ENCOUNTER — Telehealth: Payer: PPO | Admitting: Nurse Practitioner

## 2020-01-27 DIAGNOSIS — J00 Acute nasopharyngitis [common cold]: Secondary | ICD-10-CM | POA: Diagnosis not present

## 2020-01-27 MED ORDER — BENZONATATE 100 MG PO CAPS: 100.0000 mg | ORAL_CAPSULE | Freq: Three times a day (TID) | ORAL | 0 refills | Status: DC | PRN

## 2020-01-27 MED ORDER — FLUTICASONE PROPIONATE 50 MCG/ACT NA SUSP: 2.0000 | Freq: Every day | NASAL | 6 refills | Status: AC

## 2020-01-27 NOTE — Progress Notes (Signed)

## 2020-01-28 ENCOUNTER — Ambulatory Visit (HOSPITAL_COMMUNITY)
Admission: EM | Admit: 2020-01-28 | Discharge: 2020-01-28 | Disposition: A | Discharge status: Home / Self Care | Payer: PPO | Attending: Student | Admitting: Student

## 2020-01-28 ENCOUNTER — Other Ambulatory Visit: Payer: Self-pay

## 2020-01-28 ENCOUNTER — Encounter (HOSPITAL_COMMUNITY): Payer: Self-pay | Admitting: *Deleted

## 2020-01-28 DIAGNOSIS — Z7982 Long term (current) use of aspirin: Secondary | ICD-10-CM | POA: Insufficient documentation

## 2020-01-28 DIAGNOSIS — Z79899 Other long term (current) drug therapy: Secondary | ICD-10-CM | POA: Diagnosis not present

## 2020-01-28 DIAGNOSIS — Z79890 Hormone replacement therapy (postmenopausal): Secondary | ICD-10-CM | POA: Diagnosis not present

## 2020-01-28 DIAGNOSIS — Z20822 Contact with and (suspected) exposure to covid-19: Secondary | ICD-10-CM | POA: Insufficient documentation

## 2020-01-28 DIAGNOSIS — J069 Acute upper respiratory infection, unspecified: Secondary | ICD-10-CM | POA: Insufficient documentation

## 2020-01-28 DIAGNOSIS — J441 Chronic obstructive pulmonary disease with (acute) exacerbation: Secondary | ICD-10-CM | POA: Diagnosis not present

## 2020-01-28 DIAGNOSIS — R051 Acute cough: Secondary | ICD-10-CM | POA: Diagnosis not present

## 2020-01-28 DIAGNOSIS — Z87891 Personal history of nicotine dependence: Secondary | ICD-10-CM | POA: Diagnosis not present

## 2020-01-28 DIAGNOSIS — Z8719 Personal history of other diseases of the digestive system: Secondary | ICD-10-CM | POA: Diagnosis not present

## 2020-01-28 DIAGNOSIS — Z9049 Acquired absence of other specified parts of digestive tract: Secondary | ICD-10-CM | POA: Insufficient documentation

## 2020-01-28 LAB — RESP PANEL BY RT-PCR (FLU A&B, COVID) ARPGX2
Influenza A by PCR: NEGATIVE
Influenza B by PCR: NEGATIVE
SARS Coronavirus 2 by RT PCR: NEGATIVE

## 2020-01-28 MED ORDER — PREDNISONE 10 MG (21) PO TBPK: ORAL_TABLET | Freq: Every day | ORAL | 0 refills | Status: DC

## 2020-01-28 MED ORDER — BENZONATATE 200 MG PO CAPS: 200.0000 mg | ORAL_CAPSULE | Freq: Three times a day (TID) | ORAL | 0 refills | Status: AC | PRN

## 2020-01-28 NOTE — Discharge Instructions (Addendum)
--  Tessalon as needed for cough. Take one pill up to 3x daily (every 8 hours) -Prednisone taper for cough/bronchitis. I recommend taking this in the morning as it could give you energy.  -Continue using your inhalers at home -Please purchase pulse-ox monitor. These can be purchased for about $15 at the pharmacy. Monitor your oxygen several times daily. If it drops below 92-93%, seek additional medical attention.  -Seek additional medical attention if your symptoms worsen despite treatment; if you have chest pain; if you have new/worsening shortness of breath  We are currently awaiting result of your PCR covid-19 test. Please isolate at home while awaiting these results. If your test is positive for Covid-19, continue to isolate at home for a total of 10 days from symptom onset. Treat your symptoms at home with OTC remedies like tylenol/ibuprofen, mucinex, nyquil, etc. Seek medical attention if you develop high fevers, chest pain, shortness of breath, ear pain, facial pain, etc. Make sure to get up and move around every 2-3 hours while convalescing to help prevent blood clots. Drink plenty of fluids, and rest as much as possible.

## 2020-01-28 NOTE — ED Provider Notes (Signed)
[facility]    CSN: [id number] Arrival date & time: 01/28/20  1010      History   Chief Complaint Chief Complaint  Patient presents with  . Cough  . Nasal Congestion  . Headache    HPI Preston Reeves is a 80 y.o. male Presenting for URI symptoms for 7 days. History of asthma, COPD. COPD is well controlled on albuterol prn and "another inhaler." Today presenting with productive cough. Has been using his inhalers with some relief. Endorses shortness of breath with exertion but not at rest. Productive cough. Denies fevers/chills, n/v/d, chest pain, , congestion, facial pain, teeth pain, headaches, sore throat, loss of taste/smell, swollen lymph nodes, ear pain.  Denies chest pain, confusion, high fevers.  Fully vaccinated for covid-19.   HPI  Past Medical History:  Diagnosis Date  . Allergy    SEASONAL  . Asthma   . Cataract   . Colon polyps    Adenomatous  . COPD (chronic obstructive pulmonary disease) (HCC)   . Diverticulitis of colon with perforation 2010   Colectomy/ostomy  . Diverticulosis    hx of  . Hemochromatosis    hx of  . History of BPH   . History of COPD   . Hyperlipidemia   . Hypothyroidism   . Incisional hernia   . MVP (mitral valve prolapse)   . Thyroid disease     Patient Active Problem List   Diagnosis Date Noted  . Hyperglycemia 12/04/2018  . Diverticulitis of colon 10/09/2015  . Elevated PSA 10/09/2015  . PVC's (premature ventricular contractions) 07/25/2014  . Essential hypertension 07/25/2014  . Incisional hernias s/p lap/open VWH repair w mesh 06/20/2013  . Hypothyroidism 12/28/2012  . MVP (mitral valve prolapse)   . Diverticulosis   . History of BPH   . Hemochromatosis   . COPD (chronic obstructive pulmonary disease) (HCC)     Past Surgical History:  Procedure Laterality Date  . COLONOSCOPY  last 09/25/2013  . COLONOSCOPY W/ BIOPSIES  2011   Dr Marina Goodell  . COLOSTOMY TAKEDOWN  2010   Dr Donell Beers  . INGUINAL HERNIA  REPAIR Left 1999   Dr. Maryagnes Amos  . INSERTION OF MESH N/A 10/13/2013   Procedure: INSERTION OF MESH;  Surgeon: Karie Soda, MD;  Location: [facility] OR;  Service: General;  Laterality: N/A;  . LAPAROSCOPIC LYSIS OF ADHESIONS N/A 10/13/2013   Procedure: LAPAROSCOPIC LYSIS OF ADHESIONS;  Surgeon: Karie Soda, MD;  Location: [facility] OR;  Service: General;  Laterality: N/A;  . PARTIAL COLECTOMY  08/12/2008   [facility]mann; perforated colon from diverticulitis  . UPPER GI ENDOSCOPY    . VENTRAL HERNIA REPAIR N/A 10/13/2013   Procedure: REPAIR OF VENTRAL WALL HERNIA WITH UNILATERAL COMPARTMENT SEPARATION;  Surgeon: Karie Soda, MD;  Location: [facility] OR;  Service: General;  Laterality: N/A;       Home Medications    Prior to Admission medications   Medication Sig Start Date End Date Taking? Authorizing Provider  benzonatate (TESSALON) 200 MG capsule Take 1 capsule (200 mg total) by mouth 3 (three) times daily as needed for up to 7 days for cough. 01/28/20 02/04/20 Yes Rhys Martini, PA-C  predniSONE (STERAPRED UNI-PAK 21 TAB) 10 MG (21) TBPK tablet Take by mouth daily. Take 6 tabs by mouth daily  for 2 days, then 5 tabs for 2 days, then 4 tabs for 2 days, then 3 tabs for 2 days, 2 tabs for 2 days, then 1 tab by mouth daily for 2  days 01/28/20  Yes Hazel Sams, PA-C  Ascorbic Acid (VITAMIN C) 1000 MG tablet Take 1,000 mg by mouth daily.    [provider]  aspirin 81 MG tablet Take 81 mg by mouth daily.    [provider]  Calcium Carbonate-Vitamin D (CALCIUM + D PO) Take 1 tablet by mouth daily.     [provider]  Cholecalciferol (VITAMIN D) 2000 UNITS CAPS Take 2,000 Units by mouth daily.    [provider]  Docusate Sodium (COLACE PO) Take by mouth daily.     [provider]  fexofenadine-pseudoephedrine (ALLEGRA-D 24) 180-240 MG per 24 hr tablet Take 1 tablet by mouth as needed.     [provider]  FIBER PO Take by mouth daily.     [provider]   fluticasone (FLONASE) 50 MCG/ACT nasal spray Place 2 sprays into both nostrils daily. 01/27/20   Hassell Done, Mary-Margaret, FNP  losartan (COZAAR) 50 MG tablet Take 1 tablet (50 mg total) by mouth 2 (two) times daily. 04/25/19   Dorothy Spark, MD  metoprolol succinate (TOPROL-XL) 50 MG 24 hr tablet TAKE 1 TABLET(50 MG) BY MOUTH DAILY WITH OR IMMEDIATELY FOLLOWING A MEAL 11/19/19   [name], MD  Multiple Vitamins-Minerals (MULTIVITAMIN PO) Take 1 tablet by mouth daily.     [provider]  Omega-3 Fatty Acids (FISH OIL PO) Take 900 mg by mouth daily. Taking 900 daily    [provider]  PROLENSA 0.07 % SOLN Place 1 drop into the left eye at bedtime. 12/11/19   [provider]  Red Yeast Rice 600 MG CAPS Take 1,200 mg by mouth daily.     [provider]  SALINE NASAL SPRAY NA Place 1 spray into the nose daily as needed (allergies).     [provider]  SYNTHROID 150 MCG tablet TAKE 1 TABLET BY MOUTH EVERY DAY 10/22/19   Elayne Snare, MD  vitamin B-12 (CYANOCOBALAMIN) 1000 MCG tablet Take 1,000 mcg by mouth daily.    [provider]    Family History Family History  Problem Relation Age of Onset  . Breast cancer Mother   . Colon cancer Father        16's  . Heart disease Father   . Diabetes Father   . Heart attack Father   . Stroke Neg Hx   . Esophageal cancer Neg Hx   . Rectal cancer Neg Hx   . Stomach cancer Neg Hx   . Colon polyps Neg Hx     Social History Social History   Tobacco Use  . Smoking status: Former Smoker    Types: Cigarettes    Quit date: 2005    Years since quitting: 17.0  . Smokeless tobacco: Never Used  Vaping Use  . Vaping Use: Never used  Substance Use Topics  . Alcohol use: Yes    Alcohol/week: 7.0 standard drinks    Types: 7 Cans of beer per week    Comment: drinks beer weekly  . Drug use: No     Allergies   Patient has no known allergies.   Review of Systems Review of Systems   Constitutional: Negative for appetite change, chills and fever.  HENT: Negative for congestion, ear pain, rhinorrhea, sinus pressure, sinus pain and sore throat.   Eyes: Negative for redness and visual disturbance.  Respiratory: Positive for cough and shortness of breath. Negative for chest tightness and wheezing.   Cardiovascular: Negative for chest pain  and palpitations.  Gastrointestinal: Negative for abdominal pain, constipation, diarrhea, nausea and vomiting.  Genitourinary: Negative for dysuria, frequency and urgency.  Musculoskeletal: Negative for myalgias.  Neurological: Negative for dizziness, weakness and headaches.  Psychiatric/Behavioral: Negative for confusion.  All other systems reviewed and are negative.    Physical Exam Triage Vital Signs ED Triage Vitals  Enc Vitals Group     BP 01/28/20 1042 (!) 144/68     Pulse Rate 01/28/20 1042 84     Resp 01/28/20 1042 18     Temp 01/28/20 1042 98.3 F (36.8 C)     Temp Source 01/28/20 1042 Oral     SpO2 01/28/20 1042 94 %     Weight --      Height --      Head Circumference --      Peak Flow --      Pain Score 01/28/20 1047 0     Pain Loc --      Pain Edu? --      Excl. in [place]? --    No data found.  Updated Vital Signs BP (!) 144/68 (BP Location: Left Arm)   Pulse 84   Temp 98.3 F (36.8 C) (Oral)   Resp 18   SpO2 95%   Visual Acuity Right Eye Distance:   Left Eye Distance:   Bilateral Distance:    Right Eye Near:   Left Eye Near:    Bilateral Near:     Physical Exam Vitals reviewed.  Constitutional:      General: He is not in acute distress.    Appearance: He is well-developed. He is not ill-appearing.  HENT:     Head: Normocephalic and atraumatic.     Right Ear: Hearing, tympanic membrane, ear canal and external ear normal. No drainage or tenderness. No middle ear effusion. There is no impacted cerumen. Tympanic membrane is not perforated, erythematous, retracted or bulging.     Left Ear: Hearing,  tympanic membrane, ear canal and external ear normal. No drainage or tenderness.  No middle ear effusion. There is no impacted cerumen. Tympanic membrane is not perforated, erythematous, retracted or bulging.     Nose: Congestion present.     Right Sinus: No maxillary sinus tenderness or frontal sinus tenderness.     Left Sinus: No maxillary sinus tenderness or frontal sinus tenderness.     Mouth/Throat:     Pharynx: Uvula midline. No oropharyngeal exudate or posterior oropharyngeal erythema.     Tonsils: No tonsillar exudate or tonsillar abscesses.  Cardiovascular:     Rate and Rhythm: Normal rate and regular rhythm.     Heart sounds: Normal heart sounds.  Pulmonary:     Effort: No accessory muscle usage, respiratory distress or retractions.     Breath sounds: Normal air entry. Decreased breath sounds present. No wheezing, rhonchi or rales.  Abdominal:     General: Abdomen is flat. Bowel sounds are normal.     Palpations: Abdomen is soft.     Tenderness: There is no abdominal tenderness. There is no guarding or rebound. Negative signs include Murphy's sign, Rovsing's sign and McBurney's sign.  Lymphadenopathy:     Cervical: No cervical adenopathy.  Neurological:     General: No focal deficit present.     Mental Status: He is alert.  Psychiatric:        Attention and Perception: Attention and perception normal.        Mood and Affect: Mood and affect normal.  Behavior: Behavior is cooperative.      UC Treatments / Results  Labs (all labs ordered are listed, but only abnormal results are displayed) Labs Reviewed - No data to display  EKG   Radiology No results found.  Procedures Procedures (including critical care time)  Medications Ordered in UC Medications - No data to display  Initial Impression / Assessment and Plan / UC Course  I have reviewed the triage vital signs and the nursing notes.  Pertinent labs & imaging results that were available during my care  of the patient were reviewed by me and considered in my medical decision making (see chart for details).      afebrile nontachycardic nontachypneic, oxygenating well on room air. Oxygenating at 95%.decreased breath sounds throughout but no wheezes rhonchi rales or focal consolidation. Pt is clinically nontoxic. Monitor pulse-ox as below.   Prednisone taper as below. He does not have diabetes. Last A1c 5.5 11/2019.  Covid and influenza tests sent today. Patient is fully vaccinated for covid-19. Isolation precautions per CDC guidelines until negative result. Symptomatic relief with OTC Mucinex, Nyquil, etc. Return precautions- new/worsening fevers/chills, shortness of breath, chest pain, abd pain, etc.   Final Clinical Impressions(s) / UC Diagnoses   Final diagnoses:  COPD exacerbation (HCC)  Acute upper respiratory infection     Discharge Instructions     --Tessalon as needed for cough. Take one pill up to 3x daily (every 8 hours) -Prednisone taper for cough/bronchitis. I recommend taking this in the morning as it could give you energy.  -Continue using your inhalers at home -Please purchase pulse-ox monitor. These can be purchased for about $15 at the pharmacy. Monitor your oxygen several times daily. If it drops below 92-93%, seek additional medical attention.  -Seek additional medical attention if your symptoms worsen despite treatment; if you have chest pain; if you have new/worsening shortness of breath  We are currently awaiting result of your PCR covid-19 test. Please isolate at home while awaiting these results. If your test is positive for Covid-19, continue to isolate at home for a total of 10 days from symptom onset. Treat your symptoms at home with OTC remedies like tylenol/ibuprofen, mucinex, nyquil, etc. Seek medical attention if you develop high fevers, chest pain, shortness of breath, ear pain, facial pain, etc. Make sure to get up and move around every 2-3 hours while  convalescing to help prevent blood clots. Drink plenty of fluids, and rest as much as possible.     ED Prescriptions    Medication Sig Dispense Auth. Provider   benzonatate (TESSALON) 200 MG capsule Take 1 capsule (200 mg total) by mouth 3 (three) times daily as needed for up to 7 days for cough. 20 capsule Hazel Sams, PA-C   predniSONE (STERAPRED UNI-PAK 21 TAB) 10 MG (21) TBPK tablet Take by mouth daily. Take 6 tabs by mouth daily  for 2 days, then 5 tabs for 2 days, then 4 tabs for 2 days, then 3 tabs for 2 days, 2 tabs for 2 days, then 1 tab by mouth daily for 2 days 42 tablet Hazel Sams, PA-C     PDMP not reviewed this encounter.   Hazel Sams, PA-C 01/28/20 1134

## 2020-01-28 NOTE — ED Triage Notes (Signed)
Pt reports URI Sx's for one week . Pt now has Productive cough with green mucous. Pt has used a albuterl inhaler and OTC with some improvement. Pt also had a virtual visit [place] but no new meds offered.

## 2020-02-15 ENCOUNTER — Other Ambulatory Visit: Payer: Self-pay | Admitting: Hematology & Oncology

## 2020-02-28 ENCOUNTER — Telehealth: Payer: Self-pay | Admitting: Internal Medicine

## 2020-02-28 NOTE — Progress Notes (Signed)
  Chronic Care Management   Note  02/28/2020 Name: Preston Reeves MRN: [id number] DOB: [birth year]  Preston Reeves is a 80 y.o. year old male who is a primary care patient of Burns, Claudina Lick, MD. I reached out to Preston Reeves by phone today in response to a referral sent by Mr. Preston Reeves's PCP, Binnie Rail, MD.   Preston Reeves was given information about Chronic Care Management services today including:  1. CCM service includes personalized support from designated clinical staff supervised by his physician, including individualized plan of care and coordination with other care providers 2. 24/7 contact phone numbers for assistance for urgent and routine care needs. 3. Service will only be billed when office clinical staff spend 20 minutes or more in a month to coordinate care. 4. Only one practitioner may furnish and bill the service in a calendar month. 5. The patient may stop CCM services at any time (effective at the end of the month) by phone call to the office staff.   Patient agreed to services and verbal consent obtained.   Follow up plan:   Carley Perdue [employer] Scheduler

## 2020-03-05 ENCOUNTER — Telehealth: Payer: Self-pay

## 2020-03-05 NOTE — Telephone Encounter (Signed)
Called and left a message, dr pe out of the office 4/15 and I r/s pt to 4/13, advised pt to call and r/s if needed     Preston Reeves

## 2020-03-31 ENCOUNTER — Other Ambulatory Visit: Payer: Self-pay | Admitting: Cardiology

## 2020-03-31 DIAGNOSIS — I10 Essential (primary) hypertension: Secondary | ICD-10-CM

## 2020-04-12 ENCOUNTER — Telehealth: Payer: Self-pay | Admitting: Pharmacist

## 2020-04-12 NOTE — Progress Notes (Signed)
Chronic Care Management Pharmacy Assistant   Name: Preston Reeves  MRN: [id number] DOB: [birth year]  Reason for Encounter: Initial Questions    Recent office visits:  12/13/19 Dr. Quay Burow, Internal Medicine 01/04/20 Dr. Dwyane Dee Internal Medicine 01/28/20 ED Urgent Care  Recent consult visits:  [facility] visits:  None in previous 6 months  Medications: Outpatient Encounter Medications as of 04/12/2020  Medication Sig  . losartan (COZAAR) 50 MG tablet TAKE 1 TABLET(50 MG) BY MOUTH TWICE DAILY  . Ascorbic Acid (VITAMIN C) 1000 MG tablet Take 1,000 mg by mouth daily.  Marland Kitchen aspirin 81 MG tablet Take 81 mg by mouth daily.  . Calcium Carbonate-Vitamin D (CALCIUM + D PO) Take 1 tablet by mouth daily.   . Cholecalciferol (VITAMIN D) 2000 UNITS CAPS Take 2,000 Units by mouth daily.  Mariane Baumgarten Sodium (COLACE PO) Take by mouth daily.   . fexofenadine-pseudoephedrine (ALLEGRA-D 24) 180-240 MG per 24 hr tablet Take 1 tablet by mouth as needed.   Marland Kitchen FIBER PO Take by mouth daily.   . fluticasone (FLONASE) 50 MCG/ACT nasal spray Place 2 sprays into both nostrils daily.  . metoprolol succinate (TOPROL-XL) 50 MG 24 hr tablet TAKE 1 TABLET(50 MG) BY MOUTH DAILY WITH OR IMMEDIATELY FOLLOWING A MEAL  . Multiple Vitamins-Minerals (MULTIVITAMIN PO) Take 1 tablet by mouth daily.   . Omega-3 Fatty Acids (FISH OIL PO) Take 900 mg by mouth daily. Taking 900 daily  . predniSONE (STERAPRED UNI-PAK 21 TAB) 10 MG (21) TBPK tablet Take by mouth daily. Take 6 tabs by mouth daily  for 2 days, then 5 tabs for 2 days, then 4 tabs for 2 days, then 3 tabs for 2 days, 2 tabs for 2 days, then 1 tab by mouth daily for 2 days  . PROLENSA 0.07 % SOLN Place 1 drop into the left eye at bedtime.  . Red Yeast Rice 600 MG CAPS Take 1,200 mg by mouth daily.   Marland Kitchen SALINE NASAL SPRAY NA Place 1 spray into the nose daily as needed (allergies).   . SYNTHROID 150 MCG tablet TAKE 1 TABLET BY MOUTH EVERY DAY  . vitamin B-12  (CYANOCOBALAMIN) 1000 MCG tablet Take 1,000 mcg by mouth daily.   No facility-administered encounter medications on file as of 04/12/2020.    Care Gaps: Overdue for Covid-19 Boosters  Star Rating Drugs: Losartan  Have you seen any other providers since your last visit? The patient states that he last seen Dr. Dwyane Dee on 01/04/20 and the ED for sob 01/28/20  Any changes in your medications or health? The patient states that he has not had any changes in his medications or health  Any side effects from any medications? The patient states that he has not had any side effects from medications  Do you have an symptoms or problems not managed by your medications? The patient states that he does not have any symptoms or problems not managed by medications  Any concerns about your health right now? The patient states that he does not have any concerns about his health at this time  Has your provider asked that you check blood pressure, blood sugar, or follow special diet at home? The patient states that he does not check his blood pressure daily and does not follow a special diet  Do you get any type of exercise on a regular basis? The patient states that he does get out and play golf  Can you think of a goal you  would like to reach for your health? The patient states that he would like to maintain his health and live to 80 yrs old  Do you have any problems getting your medications? The patient states that he does not have any problems getting medications or the cost of medications from the pharmacy  Is there anything that you would like to discuss during the appointment? The patient states that he does not have anything specific to discuss at this time  Patient to receive a phone call asked to have medications and supplements to appointment    Wendy Poet, [place] [phone]   Time spent:20 minutes on telephone, reviewing chart notes and documentation

## 2020-04-15 ENCOUNTER — Other Ambulatory Visit: Payer: Self-pay

## 2020-04-15 ENCOUNTER — Ambulatory Visit (INDEPENDENT_AMBULATORY_CARE_PROVIDER_SITE_OTHER): Payer: PPO | Admitting: Pharmacist

## 2020-04-15 ENCOUNTER — Other Ambulatory Visit: Payer: Self-pay | Admitting: Endocrinology

## 2020-04-15 DIAGNOSIS — I10 Essential (primary) hypertension: Secondary | ICD-10-CM | POA: Diagnosis not present

## 2020-04-15 DIAGNOSIS — E890 Postprocedural hypothyroidism: Secondary | ICD-10-CM

## 2020-04-15 DIAGNOSIS — K5790 Diverticulosis of intestine, part unspecified, without perforation or abscess without bleeding: Secondary | ICD-10-CM

## 2020-04-15 DIAGNOSIS — J449 Chronic obstructive pulmonary disease, unspecified: Secondary | ICD-10-CM

## 2020-04-15 MED ORDER — SYNTHROID 150 MCG PO TABS: 150.0000 ug | ORAL_TABLET | Freq: Every day | ORAL | 1 refills | Status: DC

## 2020-04-15 NOTE — Patient Instructions (Addendum)
Visit Information  Phone number for Pharmacist: [phone]  Thank you for meeting with me to discuss your medications! I look forward to working with you to achieve your health care goals. Below is a summary of what we talked about during the visit:  Goals Addressed            This Visit's Progress   . Manage My Medicine       Timeframe:  Long-Range Goal Priority:  Medium Start Date:               04/15/20              Expected End Date:           04/15/21            Follow Up Date 10/25/20   - call for medicine refill 2 or 3 days before it runs out - call if I am sick and can't take my medicine - keep a list of all the medicines I take; vitamins and herbals too - use a pillbox to sort medicine    Why is this important?   . These steps will help you keep on track with your medicines.   Notes:       Patient Care Plan: CCM Pharmacy Care Plan    Problem Identified: Hypertension, COPD, Hypothyroidism and Allergic Rhinitis   Priority: High    Long-Range Goal: Disease Management   Start Date: 04/15/2020  Expected End Date: 04/15/2021  This Visit's Progress: On track  Priority: High  Note:   Current Barriers:  . Unable to independently monitor therapeutic efficacy  Pharmacist Clinical Goal(s):  Marland Kitchen Patient will achieve adherence to monitoring guidelines and medication adherence to achieve therapeutic efficacy through collaboration with PharmD and provider.   Interventions: . 1:1 collaboration with Binnie Rail, MD regarding development and update of comprehensive plan of care as evidenced by provider attestation and co-signature . Inter-disciplinary care team collaboration (see longitudinal plan of care) . Comprehensive medication review performed; medication list updated in electronic medical record  Hypertension (BP goal <140/90) -Controlled - office BP and home BP at goal; pt denies side effects -Current treatment: . Losartan 50 mg BID . Metoprolol succinate 50 mg  daily -Medications previously tried:   -Current home readings: 128/64, HR 68 -Current exercise habits: golfing -Denies hypotensive/hypertensive symptoms -Educated on BP goals and benefits of medications for prevention of heart attack, stroke and kidney damage; -Counseled to monitor BP at home as needed, document, and provide log at future appointments -Recommended to continue current medication  COPD (Goal: control symptoms and prevent exacerbations) -Controlled - pt reports smoking until he retired; he reports he has albuterol inhaler at home, prescribed by doctor in [place]. He only needs to use it during allergy season very rarely -Current treatment  . Albuterol HFA prn -MMRC/CAT score: not on file -Pulmonary function testing: not on file -Exacerbations requiring treatment in last 6 months: 1 -Patient denies consistent use of maintenance inhaler -Frequency of rescue inhaler use: hardly ever -Counseled on When to use rescue inhaler -Recommended to continue current medication  Hypothyroidism (Goal: maintain TSH in goal range) -Controlled - TSH is at goal; pt currently sees Dr Dwyane Dee for this but has been told his PCP can take over if he would like -Current treatment  . Synthroid 150 mcg daily AM - 30 AM -Recommended to continue current medication  Diverticulosis (Goal: manage symptoms) -Controlled -Current treatment  . Docusate 200 mg BID . Fiber BID -  Recommended to continue current medication  Allergic rhinitis (Goal: manage symptoms) -Controlled - pt reports seasonal allergies -Current treatment  . Allegra 180 mg (rotates Zyrtec, Claritin throughout the year) . Fluticasone nasal spray . Saline nasal spray -Recommended to continue current medication  Health Maintenance -Vaccine gaps: covid booster -Current therapy:  Marland Kitchen Vitamin B12 1000 mg  . Red Yeast Rice 60 mg 2 daily . Vitamin C 1000 mg daily . Calcium-MG-ZN AM . Calcium-Vitamin D . Vitamin D 2000 IU   . Multivitamin . OTC Omega-3 Fish Oil 1000 mg BID . Aspirin 81 mg daily . Glucosamine 2000 mg + Vit D3 BID -Patient is satisfied with current therapy and denies issues -Recommended to continue current medication  Patient Goals/Self-Care Activities . Patient will:  - take medications as prescribed focus on medication adherence by pill box  Follow Up Plan: Telephone follow up appointment with care management team member scheduled for: 1 year      Preston Reeves was given information about Chronic Care Management services today including:  1. CCM service includes personalized support from designated clinical staff supervised by his physician, including individualized plan of care and coordination with other care providers 2. 24/7 contact phone numbers for assistance for urgent and routine care needs. 3. Standard insurance, coinsurance, copays and deductibles apply for chronic care management only during months in which we provide at least 20 minutes of these services. Most insurances cover these services at 100%, however patients may be responsible for any copay, coinsurance and/or deductible if applicable. This service may help you avoid the need for more expensive face-to-face services. 4. Only one practitioner may furnish and bill the service in a calendar month. 5. The patient may stop CCM services at any time (effective at the end of the month) by phone call to the office staff.  Patient agreed to services and verbal consent obtained.   Patient verbalizes understanding of instructions provided today and agrees to view in [place].  Telephone follow up appointment with pharmacy team member scheduled for: 1 year  Charlene Brooke, PharmD, BCACP Clinical Pharmacist [facility] After Age 90 After age 58, you are at a higher risk for certain long-term diseases and infections as well as injuries from falls. Falls are a major cause  of broken bones and head injuries in people who are older than age 69. Getting regular preventive care can help to keep you healthy and well. Preventive care includes getting regular testing and making lifestyle changes as recommended by your health care provider. Talk with your health care provider about:  Which screenings and tests you should have. A screening is a test that checks for a disease when you have no symptoms.  A diet and exercise plan that is right for you. What should I know about screenings and tests to prevent falls? Screening and testing are the best ways to find a health problem early. Early diagnosis and treatment give you the best chance of managing medical conditions that are common after age 2. Certain conditions and lifestyle choices may make you more likely to have a fall. Your health care provider may recommend:  Regular vision checks. Poor vision and conditions such as cataracts can make you more likely to have a fall. If you wear glasses, make sure to get your prescription updated if your vision changes.  Medicine review. Work with your health care provider to regularly review all of the medicines you are taking, including over-the-counter  medicines. Ask your health care provider about any side effects that may make you more likely to have a fall. Tell your health care provider if any medicines that you take make you feel dizzy or sleepy.  Osteoporosis screening. Osteoporosis is a condition that causes the bones to get weaker. This can make the bones weak and cause them to break more easily.  Blood pressure screening. Blood pressure changes and medicines to control blood pressure can make you feel dizzy.  Strength and balance checks. Your health care provider may recommend certain tests to check your strength and balance while standing, walking, or changing positions.  Foot health exam. Foot pain and numbness, as well as not wearing proper footwear, can make you more  likely to have a fall.  Depression screening. You may be more likely to have a fall if you have a fear of falling, feel emotionally low, or feel unable to do activities that you used to do.  Alcohol use screening. Using too much alcohol can affect your balance and may make you more likely to have a fall. What actions can I take to lower my risk of falls? General instructions  Talk with your health care provider about your risks for falling. Tell your health care provider if: ? You fall. Be sure to tell your health care provider about all falls, even ones that seem minor. ? You feel dizzy, sleepy, or off-balance.  Take over-the-counter and prescription medicines only as told by your health care provider. These include any supplements.  Eat a healthy diet and maintain a healthy weight. A healthy diet includes low-fat dairy products, low-fat (lean) meats, and fiber from whole grains, beans, and lots of fruits and vegetables. Home safety  Remove any tripping hazards, such as rugs, cords, and clutter.  Install safety equipment such as grab bars in bathrooms and safety rails on stairs.  Keep rooms and walkways well-lit. Activity  Follow a regular exercise program to stay fit. This will help you maintain your balance. Ask your health care provider what types of exercise are appropriate for you.  If you need a cane or walker, use it as recommended by your health care provider.  Wear supportive shoes that have nonskid soles.   Lifestyle  Do not drink alcohol if your health care provider tells you not to drink.  If you drink alcohol, limit how much you have: ? 0-1 drink a day for women. ? 0-2 drinks a day for men.  Be aware of how much alcohol is in your drink. In the [place] one drink equals one typical bottle of beer (12 oz), one-half glass of wine (5 oz), or one shot of hard liquor (1 oz).  Do not use any products that contain nicotine or tobacco, such as cigarettes and e-cigarettes.  If you need help quitting, ask your health care provider. Summary  Having a healthy lifestyle and getting preventive care can help to protect your health and wellness after age 44.  Screening and testing are the best way to find a health problem early and help you avoid having a fall. Early diagnosis and treatment give you the best chance for managing medical conditions that are more common for people who are older than age 27.  Falls are a major cause of broken bones and head injuries in people who are older than age 28. Take precautions to prevent a fall at home.  Work with your health care provider to learn what changes you can make to  improve your health and wellness and to prevent falls. This information is not intended to replace advice given to you by your health care provider. Make sure you discuss any questions you have with your health care provider. Document Revised: 05/05/2018 Document Reviewed: 11/25/2016 Elsevier Patient Education  2021 [employer].

## 2020-04-15 NOTE — Progress Notes (Signed)
Chronic Care Management Pharmacy Note  04/15/2020 Name:  Preston Reeves MRN:  [id number] DOB:  Feb 24, 1940  Subjective: Preston Reeves is an 80 y.o. year old male who is a primary patient of Burns, Claudina Lick, MD.  The CCM team was consulted for assistance with disease management and care coordination needs.    Engaged with patient by telephone for initial visit in response to provider referral for pharmacy case management and/or care coordination services.   Consent to Services:  The patient was given the following information about Chronic Care Management services today, agreed to services, and gave verbal consent: 1. CCM service includes personalized support from designated clinical staff supervised by the primary care provider, including individualized plan of care and coordination with other care providers 2. 24/7 contact phone numbers for assistance for urgent and routine care needs. 3. Service will only be billed when office clinical staff spend 20 minutes or more in a month to coordinate care. 4. Only one practitioner may furnish and bill the service in a calendar month. 5.The patient may stop CCM services at any time (effective at the end of the month) by phone call to the office staff. 6. The patient will be responsible for cost sharing (co-pay) of up to 20% of the service fee (after annual deductible is met). Patient agreed to services and consent obtained.  Patient Care Team: Binnie Rail, MD as PCP - General (Internal Medicine) Dorothy Spark, MD as PCP - Cardiology (Cardiology) Raynelle Bring, MD as Consulting Physician (Urology) Irene Shipper, MD as Consulting Physician (Gastroenterology) Darlin Coco, MD as Consulting Physician (Cardiology) Charlton Haws, [facility] as Pharmacist (Pharmacist)  Recent office visits: 12/13/19 Dr Quay Burow OV: CPE. SOB w/ exertion, not requiring inhaler. Labs stable. F/U 1 year.   Recent consult visits: 01/28/20 Urgent Care: COPD exacerbation.  Pt reports COPD controlled with albuterol and "another inhaler". Rx'd tessalon prn, prednisone taper, continue inhalers. Advised to get pulse-ox, O2 should be > 92%  01/04/20 Dr Dwyane Dee (endocrine): f/u hypothyroidism. S/p radioactive iodine tx 2007. PCP can take over thyroid monitoring.  05/10/19 Dr Marin Olp (heme/onc): f/u hemochromatosis. Doing well, no changes.  03/29/19 PA Lenze (cardiology): f/u HTN, HLD. No changes. Low Na diet < 2 gm.  Hospital visits: None in previous 6 months  Objective:  Lab Results  Component Value Date   CREATININE 0.85 12/13/2019   BUN 13 12/13/2019   GFR 82.48 12/13/2019   GFRNONAA >60 05/10/2019   GFRAA >60 05/10/2019   NA 139 12/13/2019   K 4.1 12/13/2019   CALCIUM 9.5 12/13/2019   CO2 32 12/13/2019   GLUCOSE 106 (H) 12/13/2019    Lab Results  Component Value Date/Time   HGBA1C 5.5 12/13/2019 11:00 AM   HGBA1C 5.5 12/05/2018 08:53 AM   GFR 82.48 12/13/2019 11:00 AM   GFR 75.60 12/05/2018 08:53 AM    Last diabetic Eye exam: No results found for: HMDIABEYEEXA  Last diabetic Foot exam: No results found for: HMDIABFOOTEX   Lab Results  Component Value Date   CHOL 125 12/13/2019   HDL 45.60 12/13/2019   LDLCALC 68 12/13/2019   TRIG 59.0 12/13/2019   CHOLHDL 3 12/13/2019    Hepatic Function Latest Ref Rng & Units 12/13/2019 05/10/2019 12/05/2018  Total Protein 6.0 - 8.3 g/dL 7.4 7.3 7.8  Albumin 3.5 - 5.2 g/dL 4.0 4.4 4.2  AST 0 - 37 U/L _0 ALT 0 - 53 U/L _1 Alk Phosphatase  39 - 117 U/L 61 65 67  Total Bilirubin 0.2 - 1.2 mg/dL 0.8 1.0 1.0  Bilirubin, Direct <=0.2 mg/dL - - -    Lab Results  Component Value Date/Time   TSH 1.61 12/28/2019 10:30 AM   TSH 0.87 12/26/2018 09:12 AM   FREET4 1.27 12/28/2019 10:30 AM   FREET4 1.44 12/26/2018 09:12 AM    CBC Latest Ref Rng & Units 12/13/2019 05/10/2019 12/05/2018  WBC 4.0 - 10.5 K/uL 3.7(L) 4.5 4.3  Hemoglobin 13.0 - 17.0 g/dL 14.0 14.8 14.8  Hematocrit 39.0 - 52.0 % 40.2 42.8  42.6  Platelets 150.0 - 400.0 K/uL 218.0 232 248.0   No results found for: VD25OH  Clinical ASCVD: No  The ASCVD Risk score Mikey Bussing DC Jr., et al., 2013) failed to calculate for the following reasons:   The 2013 ASCVD risk score is only valid for ages 81 to 8    Depression screen PHQ 2/9 12/13/2019 12/05/2018 12/03/2017  Decreased Interest 0 0 0  Down, Depressed, Hopeless 0 0 0  PHQ - 2 Score 0 0 0  Altered sleeping - - 0  Tired, decreased energy - - 0  Change in appetite - - 0  Feeling bad or failure about yourself  - - 0  Trouble concentrating - - 0  Moving slowly or fidgety/restless - - 0  Suicidal thoughts - - 0  PHQ-9 Score - - 0      Social History   Tobacco Use  Smoking Status Former Smoker  . Types: Cigarettes  . Quit date: 2005  . Years since quitting: 17.2  Smokeless Tobacco Never Used   BP Readings from Last 3 Encounters:  01/28/20 (!) 144/68  01/04/20 (!) 156/90  12/13/19 (!) 142/82   Pulse Readings from Last 3 Encounters:  01/28/20 84  01/04/20 82  12/13/19 66   Wt Readings from Last 3 Encounters:  01/04/20 181 lb (82.1 kg)  12/13/19 183 lb 6.4 oz (83.2 kg)  05/10/19 180 lb 0.6 oz (81.7 kg)   BMI Readings from Last 3 Encounters:  01/04/20 26.16 kg/m  12/13/19 25.58 kg/m  05/10/19 25.11 kg/m    Assessment/Interventions: Review of patient past medical history, allergies, medications, health status, including review of consultants reports, laboratory and other test data, was performed as part of comprehensive evaluation and provision of chronic care management services.   SDOH:  (Social Determinants of Health) assessments and interventions performed: Yes   CCM Care Plan  No Known Allergies  Medications Reviewed Today    Reviewed by Charlton Haws, [facility] (Pharmacist) on 04/15/20 at 1158  Med List Status: <None>  Medication Order Taking? Sig Documenting Provider Last Dose Status Informant  albuterol (VENTOLIN HFA) 108 (90 Base) MCG/ACT  inhaler [id number] Yes Inhale 1-2 puffs into the lungs every 6 (six) hours as needed for wheezing or shortness of breath. [provider] Taking Active   Ascorbic Acid (VITAMIN C) 1000 MG tablet [id number] Yes Take 1,000 mg by mouth daily. [provider] Taking Active Spouse/Significant Other  aspirin 81 MG tablet [id number] Yes Take 81 mg by mouth daily. [provider] Taking Active Spouse/Significant Other  Calcium Carbonate-Vitamin D (CALCIUM + D PO) [id number] Yes Take 1 tablet by mouth daily.  [provider] Taking Active Spouse/Significant Other  Cholecalciferol (VITAMIN D) 2000 UNITS CAPS [id number] Yes Take 2,000 Units by mouth daily. [provider] Taking Active Spouse/Significant Other  Docusate Sodium (COLACE PO) [id number] Yes Take 200 mg by mouth in the morning  and at bedtime. [provider] Taking Active   fexofenadine (ALLEGRA) 180 MG tablet [id number] Yes Take 180 mg by mouth daily. [provider] Taking Active   FIBER PO [id number] Yes Take by mouth in the morning and at bedtime. [provider] Taking Active   fluticasone (FLONASE) 50 MCG/ACT nasal spray [id number] Yes Place 2 sprays into both nostrils daily. Hassell Done Mary-Margaret, FNP Taking Active   Glucosamine-Chondroit-Vit C-Mn (GLUCOSAMINE 1500 COMPLEX PO) [id number] Yes Take by mouth in the morning and at bedtime. [provider] Taking Active   losartan (COZAAR) 50 MG tablet [id number] Yes TAKE 1 TABLET(50 MG) BY MOUTH TWICE DAILY Dorothy Spark, MD Taking Active   metoprolol succinate (TOPROL-XL) 50 MG 24 hr tablet [id number] Yes TAKE 1 TABLET(50 MG) BY MOUTH DAILY WITH OR IMMEDIATELY FOLLOWING A MEAL Ennever, Rudell Cobb, MD Taking Active   Multiple Vitamins-Minerals (MULTIVITAMIN PO) [id number] Yes Take 1 tablet by mouth daily.  [provider] Taking Active Spouse/Significant Other  Omega-3 Fatty Acids (FISH OIL PO) [id number] Yes Take 1,000 mg by  mouth in the morning and at bedtime. [provider] Taking Active Spouse/Significant Other  Red Yeast Rice 600 MG CAPS [id number] Yes Take 1,200 mg by mouth daily.  [provider] Taking Active Spouse/Significant Other  SALINE NASAL SPRAY NA [id number] Yes Place 1 spray into the nose daily as needed (allergies).  [provider] Taking Active Spouse/Significant Other  SYNTHROID 150 MCG tablet [id number] Yes Take 1 tablet (150 mcg total) by mouth daily. Elayne Snare, MD Taking Active   vitamin B-12 (CYANOCOBALAMIN) 1000 MCG tablet [id number] Yes Take 1,000 mcg by mouth daily. [provider] Taking Active Spouse/Significant Other          Patient Active Problem List   Diagnosis Date Noted  . Hyperglycemia 12/04/2018  . Diverticulitis of colon 10/09/2015  . Elevated PSA 10/09/2015  . PVC's (premature ventricular contractions) 07/25/2014  . Essential hypertension 07/25/2014  . Incisional hernias s/p lap/open VWH repair w mesh 06/20/2013  . Hypothyroidism 12/28/2012  . MVP (mitral valve prolapse)   . Diverticulosis   . History of BPH   . Hemochromatosis   . COPD (chronic obstructive pulmonary disease) ([place])     Immunization History  Administered Date(s) Administered  . Fluad Quad(high Dose 65+) 12/05/2018, 12/13/2019  . Influenza, High Dose Seasonal PF 09/15/2016, 12/03/2017  . Influenza,inj,Quad PF,6+ Mos 10/14/2013  . Influenza-Unspecified 12/26/2012, 10/28/2014, 10/06/2015, 09/30/2016  . PFIZER(Purple Top)SARS-COV-2 Vaccination 04/15/2019, 05/17/2019  . Pneumococcal Conjugate-13 06/07/2014  . Pneumococcal Polysaccharide-23 11/27/2011  . Td 06/20/2013    Conditions to be addressed/monitored:  Hypertension, COPD, Hypothyroidism and Allergic Rhinitis  Care Plan : [place]  Updates made by Charlton Haws, RPH since 04/15/2020 12:00 AM    Problem: Hypertension, COPD, Hypothyroidism and Allergic Rhinitis   Priority: High     Long-Range Goal: Disease Management   Start Date: 04/15/2020  Expected End Date: 04/15/2021  This Visit's Progress: On track  Priority: High  Note:   Current Barriers:  . Unable to independently monitor therapeutic efficacy  Pharmacist Clinical Goal(s):  Marland Kitchen Patient will achieve adherence to monitoring guidelines and medication adherence to achieve therapeutic efficacy through collaboration with PharmD and provider.   Interventions: . 1:1 collaboration with Binnie Rail, MD regarding development and update of comprehensive plan of care as evidenced by provider attestation and co-signature . Inter-disciplinary care team collaboration (see longitudinal plan of care) . Comprehensive medication review performed; medication list updated  in electronic medical record  Hypertension (BP goal <140/90) -Controlled - office BP and home BP at goal; pt denies side effects -Current treatment: . Losartan 50 mg BID . Metoprolol succinate 50 mg daily -Medications previously tried:   -Current home readings: 128/64, HR 68 -Current exercise habits: golfing -Denies hypotensive/hypertensive symptoms -Educated on BP goals and benefits of medications for prevention of heart attack, stroke and kidney damage; -Counseled to monitor BP at home as needed, document, and provide log at future appointments -Recommended to continue current medication  COPD (Goal: control symptoms and prevent exacerbations) -Controlled - pt reports smoking until he retired; he reports he has albuterol inhaler at home, prescribed by doctor in [place]. He only needs to use it during allergy season very rarely -Current treatment  . Albuterol HFA prn -MMRC/CAT score: not on file -Pulmonary function testing: not on file -Exacerbations requiring treatment in last 6 months: 1 -Patient denies consistent use of maintenance inhaler -Frequency of rescue inhaler use: hardly ever -Counseled on When to use rescue inhaler -Recommended to  continue current medication  Hypothyroidism (Goal: maintain TSH in goal range) -Controlled - TSH is at goal; pt currently sees Dr Dwyane Dee for this but has been told his PCP can take over if he would like -Current treatment  . Synthroid 150 mcg daily AM - 30 AM -Recommended to continue current medication  Diverticulosis (Goal: manage symptoms) -Controlled -Current treatment  . Docusate 200 mg BID . Fiber BID -Recommended to continue current medication  Allergic rhinitis (Goal: manage symptoms) -Controlled - pt reports seasonal allergies -Current treatment  . Allegra 180 mg (rotates Zyrtec, Claritin throughout the year) . Fluticasone nasal spray . Saline nasal spray -Recommended to continue current medication  Health Maintenance -Vaccine gaps: covid booster -Current therapy:  Marland Kitchen Vitamin B12 1000 mg  . Red Yeast Rice 60 mg 2 daily . Vitamin C 1000 mg daily . Calcium-MG-ZN AM . Calcium-Vitamin D . Vitamin D 2000 IU  . Multivitamin . OTC Omega-3 Fish Oil 1000 mg BID . Aspirin 81 mg daily . Glucosamine 2000 mg + Vit D3 BID -Patient is satisfied with current therapy and denies issues -Recommended to continue current medication  Patient Goals/Self-Care Activities . Patient will:  - take medications as prescribed focus on medication adherence by pill box  Follow Up Plan: Telephone follow up appointment with care management team member scheduled for: 1 year      Medication Assistance: None required.  Patient affirms current coverage meets needs.  Patient's preferred pharmacy is:  [id number] [address], [place] AT [address] [place] [address] Phone: [phone] Fax: [place], [place] - [address] [place] [address] [place] [address] Phone: [phone] Fax: [phone]  Uses pill box? Yes Pt endorses  100% compliance  We discussed: Current pharmacy is preferred with insurance plan and patient is satisfied with pharmacy services Patient decided to: Continue current medication management strategy  Care Plan and Follow Up Patient Decision:  Patient agrees to Care Plan and Follow-up.  Plan: Telephone follow up appointment with care management team member scheduled for:  1 year  Charlene Brooke, PharmD, [facility] Clinical Pharmacist [facility] [phone]

## 2020-04-17 NOTE — Progress Notes (Unsigned)
Cardiology Office Note:    Date:  04/19/2020   ID:  Preston Reeves, DOB 1940-08-18, MRN [id number]  PCP:  Binnie Rail, MD   [place]  Cardiologist:  Ena Dawley, MD  Advanced Practice Provider:  No care team member to display Electrophysiologist:  None    Referring MD: Binnie Rail, MD    History of Present Illness:    Preston Reeves is a 80 y.o. male with a hx of HTN, MVP, HLD, thyrotoxicosis, hemochromatosis, hypothyroidism S/P RAI who was previously followed by Dr. Meda Coffee now returning to clinic for follow-up.  Last visit with Ermalinda Barrios, PA-C in 04/25/19 where his blood pressure is better controlled. No changes in medications made at that time.  Patient states that he overall feels well. Blood pressure well controlled at home running 120s/80s. No chest pain, shortness of breath, lightheadedness, LE edema. Patient is active and able to play golf and work in the yard without issues. Followed by Hematologist for hemochromatosis with last phlebotomy about 3 years ago.   Past Medical History:  Diagnosis Date  . Allergy    SEASONAL  . Asthma   . Cataract   . Colon polyps    Adenomatous  . COPD (chronic obstructive pulmonary disease) ([place])   . Diverticulitis of colon with perforation 2010   Colectomy/ostomy  . Diverticulosis    hx of  . Hemochromatosis    hx of  . History of BPH   . History of COPD   . Hyperlipidemia   . Hypothyroidism   . Incisional hernia   . MVP (mitral valve prolapse)   . Thyroid disease     Past Surgical History:  Procedure Laterality Date  . COLONOSCOPY  last 09/25/2013  . COLONOSCOPY W/ BIOPSIES  2011   Dr Henrene Pastor  . COLOSTOMY TAKEDOWN  2010   Dr Barry Dienes  . INGUINAL HERNIA REPAIR Left 1999   Dr. Rebekah Chesterfield  . INSERTION OF MESH N/A 10/13/2013   Procedure: INSERTION OF MESH;  Surgeon: Michael Boston, MD;  Location: [facility];  Service: General;  Laterality: N/A;  . LAPAROSCOPIC LYSIS OF ADHESIONS N/A 10/13/2013    Procedure: LAPAROSCOPIC LYSIS OF ADHESIONS;  Surgeon: Michael Boston, MD;  Location: [facility];  Service: General;  Laterality: N/A;  . PARTIAL COLECTOMY  08/12/2008   [facility]; perforated colon from diverticulitis  . UPPER GI ENDOSCOPY    . VENTRAL HERNIA REPAIR N/A 10/13/2013   Procedure: REPAIR OF VENTRAL WALL HERNIA WITH UNILATERAL COMPARTMENT SEPARATION;  Surgeon: Michael Boston, MD;  Location: [facility];  Service: General;  Laterality: N/A;    Current Medications: Current Meds  Medication Sig  . albuterol (VENTOLIN HFA) 108 (90 Base) MCG/ACT inhaler Inhale 1-2 puffs into the lungs every 6 (six) hours as needed for wheezing or shortness of breath.  . Ascorbic Acid (VITAMIN C) 1000 MG tablet Take 1,000 mg by mouth daily.  Marland Kitchen aspirin 81 MG tablet Take 81 mg by mouth daily.  . Calcium Carbonate-Vitamin D (CALCIUM + D PO) Take 1 tablet by mouth daily.   . Cholecalciferol (VITAMIN D) 2000 UNITS CAPS Take 2,000 Units by mouth daily.  Mariane Baumgarten Sodium (COLACE PO) Take 200 mg by mouth in the morning and at bedtime.  . fexofenadine (ALLEGRA) 180 MG tablet Take 180 mg by mouth daily.  Marland Kitchen FIBER PO Take by mouth in the morning and at bedtime.  . fluticasone (FLONASE) 50 MCG/ACT nasal spray Place 2 sprays into both nostrils daily.  Marland Kitchen  Glucosamine-Chondroit-Vit C-Mn (GLUCOSAMINE 1500 COMPLEX PO) Take by mouth in the morning and at bedtime.  Marland Kitchen losartan (COZAAR) 50 MG tablet TAKE 1 TABLET(50 MG) BY MOUTH TWICE DAILY  . metoprolol succinate (TOPROL-XL) 50 MG 24 hr tablet TAKE 1 TABLET(50 MG) BY MOUTH DAILY WITH OR IMMEDIATELY FOLLOWING A MEAL  . Multiple Vitamins-Minerals (MULTIVITAMIN PO) Take 1 tablet by mouth daily.   . Omega-3 Fatty Acids (FISH OIL PO) Take 1,000 mg by mouth in the morning and at bedtime.  . Red Yeast Rice 600 MG CAPS Take 1,200 mg by mouth daily.   Marland Kitchen SALINE NASAL SPRAY NA Place 1 spray into the nose daily as needed (allergies).   . SYNTHROID 150 MCG tablet Take 1 tablet (150 mcg total) by mouth  daily.  . vitamin B-12 (CYANOCOBALAMIN) 1000 MCG tablet Take 1,000 mcg by mouth daily.     Allergies:   Patient has no known allergies.   Social History   Socioeconomic History  . Marital status: Married    Spouse name: Not on file  . Number of children: Not on file  . Years of education: Not on file  . Highest education level: Not on file  Occupational History  . Occupation: Retired    Fish farm manager: RETIRED  Tobacco Use  . Smoking status: Former Smoker    Types: Cigarettes    Quit date: 2005    Years since quitting: 17.2  . Smokeless tobacco: Never Used  Vaping Use  . Vaping Use: Never used  Substance and Sexual Activity  . Alcohol use: Yes    Alcohol/week: 7.0 standard drinks    Types: 7 Cans of beer per week    Comment: drinks beer weekly  . Drug use: No  . Sexual activity: Yes  Other Topics Concern  . Not on file  Social History Narrative   Married. Education: [employer].   Social Determinants of Health   Financial Resource Strain: Low Risk   . Difficulty of Paying Living Expenses: Not hard at all  Food Insecurity: Not on file  Transportation Needs: Not on file  Physical Activity: Not on file  Stress: Not on file  Social Connections: Not on file     Family History: The patient's family history includes Breast cancer in his mother; Colon cancer in his father; Diabetes in his father; Heart attack in his father; Heart disease in his father. There is no history of Stroke, Esophageal cancer, Rectal cancer, Stomach cancer, or Colon polyps.  ROS:   Please see the history of present illness.    Review of Systems  Constitutional: Negative for chills and fever.  HENT: Negative for hearing loss.   Eyes: Negative for blurred vision and redness.  Respiratory: Negative for shortness of breath.   Cardiovascular: Negative for chest pain, palpitations, orthopnea, claudication, leg swelling and PND.  Gastrointestinal: Negative for heartburn, melena, nausea and vomiting.   Genitourinary: Negative for dysuria and flank pain.  Musculoskeletal: Negative for falls.  Neurological: Negative for dizziness and loss of consciousness.  Endo/Heme/Allergies: Negative for polydipsia.  Psychiatric/Behavioral: Negative for substance abuse.    EKGs/Labs/Other Studies Reviewed:    The following studies were reviewed today: TTE 01/30/2016: Study Conclusions  - Left ventricle: The cavity size was normal. Systolic function was  vigorous. The estimated ejection fraction was in the range of 65%  to 70%. Wall motion was normal; there were no regional wall  motion abnormalities. Doppler parameters are consistent with  abnormal left ventricular relaxation (grade 1 diastolic  dysfunction).  There was no evidence of elevated ventricular  filling pressure by Doppler parameters.  - Aortic valve: Valve mobility was restricted. There was mild  stenosis. There was no regurgitation.  - Aortic root: The aortic root was normal in size.  - Ascending aorta: The ascending aorta was normal in size.  - Mitral valve: There was trivial regurgitation.  - Left atrium: The atrium was normal in size.  - Right ventricle: Systolic function was normal.  - Right atrium: The atrium was normal in size.  - Tricuspid valve: There was trivial regurgitation.  - Pulmonary arteries: Systolic pressure was at the upper limits of  normal. PA peak pressure: 31 mm Hg (S).  - Inferior vena cava: The vessel was normal in size.  - Pericardium, extracardiac: There was no pericardial effusion.   EKG:  EKG is  ordered today.  The ekg ordered today demonstrates NSR with LBBB (chronic) with HR 70  Recent Labs: 12/13/2019: ALT 18; BUN 13; Creatinine, Ser 0.85; Hemoglobin 14.0; Platelets 218.0; Potassium 4.1; Sodium 139 12/28/2019: TSH 1.61  Recent Lipid Panel    Component Value Date/Time   CHOL 125 12/13/2019 1100   CHOL 155 09/18/2016 0826   TRIG 59.0 12/13/2019 1100   HDL 45.60 12/13/2019 1100    HDL 41 09/18/2016 0826   CHOLHDL 3 12/13/2019 1100   VLDL 11.8 12/13/2019 1100   LDLCALC 68 12/13/2019 1100   LDLCALC 100 (H) 09/18/2016 0826     Physical Exam:    VS:  BP (!) 144/82 (BP Location: Right Arm, Patient Position: Sitting, Cuff Size: Normal)   Wt 179 lb 12.8 oz (81.6 kg)   BMI 25.98 kg/m     Wt Readings from Last 3 Encounters:  04/19/20 179 lb 12.8 oz (81.6 kg)  01/04/20 181 lb (82.1 kg)  12/13/19 183 lb 6.4 oz (83.2 kg)     GEN:  Well nourished, well developed in no acute distress HEENT: Normal NECK: No JVD; No carotid bruits CARDIAC: RRR, no murmurs, rubs, gallops RESPIRATORY:  Clear to auscultation without rales, wheezing or rhonchi  ABDOMEN: Soft, non-tender, non-distended MUSCULOSKELETAL:  No edema; No deformity  SKIN: Warm and dry NEUROLOGIC:  Alert and oriented x 3 PSYCHIATRIC:  Normal affect   ASSESSMENT:    1. Essential hypertension   2. Hereditary hemochromatosis ([place])   3. Hyperlipidemia, unspecified hyperlipidemia type   4. LBBB (left bundle branch block)    PLAN:    In order of problems listed above:  #HTN: Elevated here but well controlled at home in 120/80s.  -Continue Losartan 50mg  daily -Continue Metop 50mg  XL daily  #?MVP: Not present on TTE 2017. -Trivial MR on TTE in 2017 -Follow-up repeat TTE  #HLD: Controlled with LDL 68 in 11/2019. -Continue red yeast rice -Continue lifestyle modifications  #History of hemochromatosis: LVEF 65-70% on TTE in 2017. No signs of cardiac involvement. -Repeat TTE  #Chronic LBBB: Stable. TTE in 2017 with preserved LVEF. -Repeat TTE for serial monitoring     Medication Adjustments/Labs and Tests Ordered: Current medicines are reviewed at length with the patient today.  Concerns regarding medicines are outlined above.  No orders of the defined types were placed in this encounter.  No orders of the defined types were placed in this encounter.   Patient Instructions  Medication  Instructions:  Your physician recommends that you continue on your current medications as directed. Please refer to the Current Medication list given to you today.  *If you need a refill on your cardiac  medications before your next appointment, please call your pharmacy*   Lab Work: none If you have labs (blood work) drawn today and your tests are completely normal, you will receive your results only by: Marland Kitchen MyChart Message (if you have MyChart) OR . A paper copy in the mail If you have any lab test that is abnormal or we need to change your treatment, we will call you to review the results.   Testing/Procedures: Your physician has requested that you have an echocardiogram. Echocardiography is a painless test that uses sound waves to create images of your heart. It provides your doctor with information about the size and shape of your heart and how well your heart's chambers and valves are working. This procedure takes approximately one hour. There are no restrictions for this procedure.     Follow-Up: At [facility], you and your health needs are our priority.  As part of our continuing mission to provide you with exceptional heart care, we have created designated Provider Care Teams.  These Care Teams include your primary Cardiologist (physician) and Advanced Practice Providers (APPs -  Physician Assistants and Nurse Practitioners) who all work together to provide you with the care you need, when you need it.  We recommend signing up for the patient portal called "MyChart".  Sign up information is provided on this After Visit Summary.  MyChart is used to connect with patients for Virtual Visits (Telemedicine).  Patients are able to view lab/test results, encounter notes, upcoming appointments, etc.  Non-urgent messages can be sent to your provider as well.   To learn more about what you can do with MyChart, go to [email or web address].    Your next appointment:   1 year(s)  The  format for your next appointment:   In Person  Provider:   Gwyndolyn Kaufman, MD   Other Instructions      Signed, Freada Bergeron, MD  04/19/2020 8:31 AM    [place]

## 2020-04-19 ENCOUNTER — Other Ambulatory Visit: Payer: Self-pay

## 2020-04-19 ENCOUNTER — Ambulatory Visit: Payer: PPO | Admitting: Cardiology

## 2020-04-19 ENCOUNTER — Encounter: Payer: Self-pay | Admitting: Cardiology

## 2020-04-19 VITALS — BP 144/82 | Wt 179.8 lb

## 2020-04-19 DIAGNOSIS — I447 Left bundle-branch block, unspecified: Secondary | ICD-10-CM | POA: Diagnosis not present

## 2020-04-19 DIAGNOSIS — I10 Essential (primary) hypertension: Secondary | ICD-10-CM

## 2020-04-19 DIAGNOSIS — E785 Hyperlipidemia, unspecified: Secondary | ICD-10-CM | POA: Diagnosis not present

## 2020-04-19 NOTE — Patient Instructions (Signed)
Medication Instructions:  Your physician recommends that you continue on your current medications as directed. Please refer to the Current Medication list given to you today.  *If you need a refill on your cardiac medications before your next appointment, please call your pharmacy*   Lab Work: none If you have labs (blood work) drawn today and your tests are completely normal, you will receive your results only by: Marland Kitchen MyChart Message (if you have MyChart) OR . A paper copy in the mail If you have any lab test that is abnormal or we need to change your treatment, we will call you to review the results.   Testing/Procedures: Your physician has requested that you have an echocardiogram. Echocardiography is a painless test that uses sound waves to create images of your heart. It provides your doctor with information about the size and shape of your heart and how well your heart's chambers and valves are working. This procedure takes approximately one hour. There are no restrictions for this procedure.     Follow-Up: At [facility], you and your health needs are our priority.  As part of our continuing mission to provide you with exceptional heart care, we have created designated Provider Care Teams.  These Care Teams include your primary Cardiologist (physician) and Advanced Practice Providers (APPs -  Physician Assistants and Nurse Practitioners) who all work together to provide you with the care you need, when you need it.  We recommend signing up for the patient portal called "MyChart".  Sign up information is provided on this After Visit Summary.  MyChart is used to connect with patients for Virtual Visits (Telemedicine).  Patients are able to view lab/test results, encounter notes, upcoming appointments, etc.  Non-urgent messages can be sent to your provider as well.   To learn more about what you can do with MyChart, go to [email or web address].    Your next appointment:   1  year(s)  The format for your next appointment:   In Person  Provider:   Gwyndolyn Kaufman, MD   Other Instructions

## 2020-04-19 NOTE — Addendum Note (Signed)
Addended by: Stephani Police on: 04/19/2020 08:37 AM   Modules accepted: Orders

## 2020-04-23 DIAGNOSIS — R972 Elevated prostate specific antigen [PSA]: Secondary | ICD-10-CM | POA: Diagnosis not present

## 2020-04-26 NOTE — Addendum Note (Signed)
Addended by: Maren Beach, Takeyla Million A on: 04/26/2020 10:05 AM   Modules accepted: Orders

## 2020-05-03 DIAGNOSIS — R972 Elevated prostate specific antigen [PSA]: Secondary | ICD-10-CM | POA: Diagnosis not present

## 2020-05-08 ENCOUNTER — Inpatient Hospital Stay: Payer: PPO | Attending: Hematology & Oncology

## 2020-05-08 ENCOUNTER — Other Ambulatory Visit: Payer: Self-pay

## 2020-05-08 ENCOUNTER — Telehealth: Payer: Self-pay

## 2020-05-08 ENCOUNTER — Inpatient Hospital Stay (HOSPITAL_BASED_OUTPATIENT_CLINIC_OR_DEPARTMENT_OTHER): Payer: PPO | Admitting: Hematology & Oncology

## 2020-05-08 ENCOUNTER — Telehealth: Payer: Self-pay | Admitting: *Deleted

## 2020-05-08 LAB — CBC WITH DIFFERENTIAL (CANCER CENTER ONLY)
Abs Immature Granulocytes: 0 10*3/uL (ref 0.00–0.07)
Basophils Absolute: 0 10*3/uL (ref 0.0–0.1)
Basophils Relative: 1 %
Eosinophils Absolute: 0.2 10*3/uL (ref 0.0–0.5)
Eosinophils Relative: 5 %
HCT: 41.5 % (ref 39.0–52.0)
Hemoglobin: 14.6 g/dL (ref 13.0–17.0)
Immature Granulocytes: 0 %
Lymphocytes Relative: 28 %
Lymphs Abs: 1.2 10*3/uL (ref 0.7–4.0)
MCH: 34.8 pg — ABNORMAL HIGH (ref 26.0–34.0)
MCHC: 35.2 g/dL (ref 30.0–36.0)
MCV: 98.8 fL (ref 80.0–100.0)
Monocytes Absolute: 0.5 10*3/uL (ref 0.1–1.0)
Monocytes Relative: 12 %
Neutro Abs: 2.2 10*3/uL (ref 1.7–7.7)
Neutrophils Relative %: 54 %
Platelet Count: 230 10*3/uL (ref 150–400)
RBC: 4.2 MIL/uL — ABNORMAL LOW (ref 4.22–5.81)
RDW: 12.1 % (ref 11.5–15.5)
WBC Count: 4.2 10*3/uL (ref 4.0–10.5)
nRBC: 0 % (ref 0.0–0.2)

## 2020-05-08 LAB — CMP (CANCER CENTER ONLY)
ALT: 11 U/L (ref 0–44)
AST: 21 U/L (ref 15–41)
Albumin: 4.3 g/dL (ref 3.5–5.0)
Alkaline Phosphatase: 63 U/L (ref 38–126)
Anion gap: 7 (ref 5–15)
BUN: 16 mg/dL (ref 8–23)
CO2: 29 mmol/L (ref 22–32)
Calcium: 9.9 mg/dL (ref 8.9–10.3)
Chloride: 103 mmol/L (ref 98–111)
Creatinine: 0.84 mg/dL (ref 0.61–1.24)
GFR, Estimated: >60 mL/min (ref >60)
Glucose, Bld: 132 mg/dL — ABNORMAL HIGH (ref 70–99)
Potassium: 4.5 mmol/L (ref 3.5–5.1)
Sodium: 139 mmol/L (ref 135–145)
Total Bilirubin: 0.9 mg/dL (ref 0.3–1.2)
Total Protein: 7.4 g/dL (ref 6.5–8.1)

## 2020-05-08 LAB — IRON AND TIBC
Iron: 230 ug/dL — ABNORMAL HIGH (ref 42–163)
Saturation Ratios: 103 % — ABNORMAL HIGH (ref 20–55)
TIBC: 223 ug/dL (ref 202–409)
UIBC: UNDETERMINED ug/dL (ref 117–376)

## 2020-05-08 LAB — FERRITIN: Ferritin: 56 ng/mL (ref 24–336)

## 2020-05-08 NOTE — Telephone Encounter (Signed)
-----   Message from [name], MD sent at 05/08/2020  1:40 PM EDT ----- Call - unfortunately we are going to have to phlebotomize him!!!  The iron levels are way too high!!  He needs 2 phlebotomies!!  Please set up!!!  We need to actually check his iron studies in 3 months!!  Laurey Arrow

## 2020-05-08 NOTE — Progress Notes (Signed)
Hematology and Oncology Follow Up Visit  ACHERON SUGG [id number] 1940/02/10 80 y.o. 05/08/2020   Principle Diagnosis:   Hemochromatosis  Current Therapy:    Phlebotomy to maintain ferritin less than 100     Interim History:  Mr. Pruss is back for follow-up.  As always, he is doing quite well.  He had a good year.  He has been quite busy.  He and his family go on vacation in June.  He has been playing golf.  He has had no problems with pain.  There is been no issues with cardiac disease.  His last iron studies showed a ferritin of 21 with iron saturation of 36%.  He has had no change in bowel or bladder habits.  There has been no issues with cough or shortness of breath.  He has had no problems with the COVID.  Overall, his performance status is ECOG 0.    Medications:  Current Outpatient Medications:  .  albuterol (VENTOLIN HFA) 108 (90 Base) MCG/ACT inhaler, Inhale 1-2 puffs into the lungs every 6 (six) hours as needed for wheezing or shortness of breath., Disp: , Rfl:  .  Ascorbic Acid (VITAMIN C) 1000 MG tablet, Take 1,000 mg by mouth daily., Disp: , Rfl:  .  aspirin 81 MG tablet, Take 81 mg by mouth daily., Disp: , Rfl:  .  Calcium Carbonate-Vitamin D (CALCIUM + D PO), Take 1 tablet by mouth daily. , Disp: , Rfl:  .  Cholecalciferol (VITAMIN D) 2000 UNITS CAPS, Take 2,000 Units by mouth daily., Disp: , Rfl:  .  Docusate Sodium (COLACE PO), Take 200 mg by mouth in the morning and at bedtime., Disp: , Rfl:  .  fexofenadine (ALLEGRA) 180 MG tablet, Take 180 mg by mouth daily., Disp: , Rfl:  .  FIBER PO, Take by mouth in the morning and at bedtime., Disp: , Rfl:  .  fluticasone (FLONASE) 50 MCG/ACT nasal spray, Place 2 sprays into both nostrils daily., Disp: 16 g, Rfl: 6 .  Glucosamine-Chondroit-Vit C-Mn (GLUCOSAMINE 1500 COMPLEX PO), Take by mouth in the morning and at bedtime., Disp: , Rfl:  .  losartan (COZAAR) 50 MG tablet, TAKE 1 TABLET(50 MG) BY MOUTH TWICE DAILY, Disp:  180 tablet, Rfl: 1 .  metoprolol succinate (TOPROL-XL) 50 MG 24 hr tablet, TAKE 1 TABLET(50 MG) BY MOUTH DAILY WITH OR IMMEDIATELY FOLLOWING A MEAL, Disp: 90 tablet, Rfl: 0 .  Multiple Vitamins-Minerals (MULTIVITAMIN PO), Take 1 tablet by mouth daily. , Disp: , Rfl:  .  Omega-3 Fatty Acids (FISH OIL PO), Take 1,000 mg by mouth in the morning and at bedtime., Disp: , Rfl:  .  Red Yeast Rice 600 MG CAPS, Take 1,200 mg by mouth daily. , Disp: , Rfl:  .  SALINE NASAL SPRAY NA, Place 1 spray into the nose daily as needed (allergies). , Disp: , Rfl:  .  SYNTHROID 150 MCG tablet, Take 1 tablet (150 mcg total) by mouth daily., Disp: 90 tablet, Rfl: 1 .  vitamin B-12 (CYANOCOBALAMIN) 1000 MCG tablet, Take 1,000 mcg by mouth daily., Disp: , Rfl:   Allergies: No Known Allergies  Past Medical History, Surgical history, Social history, and Family History were reviewed and updated.  Review of Systems: Review of Systems  Constitutional: Negative.   HENT: Negative.   Eyes: Negative.   Respiratory: Negative.   Cardiovascular: Negative.   Gastrointestinal: Negative.   Genitourinary: Negative.   Musculoskeletal: Negative.   Skin: Negative.   Neurological: Negative.  Endo/Heme/Allergies: Negative.   Psychiatric/Behavioral: Negative.      Physical Exam:  weight is 179 lb (81.2 kg). His oral temperature is 98.6 F (37 C). His blood pressure is 158/54 (abnormal) and his pulse is 63. His respiration is 18 and oxygen saturation is 97%.   Wt Readings from Last 3 Encounters:  05/08/20 179 lb (81.2 kg)  04/19/20 179 lb 12.8 oz (81.6 kg)  01/04/20 181 lb (82.1 kg)     Physical Exam Vitals reviewed.  HENT:     Head: Normocephalic and atraumatic.  Eyes:     Pupils: Pupils are equal, round, and reactive to light.  Cardiovascular:     Rate and Rhythm: Normal rate and regular rhythm.     Heart sounds: Normal heart sounds.  Pulmonary:     Effort: Pulmonary effort is normal.     Breath sounds:  Normal breath sounds.  Abdominal:     General: Bowel sounds are normal.     Palpations: Abdomen is soft.  Musculoskeletal:        General: No tenderness or deformity. Normal range of motion.     Cervical back: Normal range of motion.  Lymphadenopathy:     Cervical: No cervical adenopathy.  Skin:    General: Skin is warm and dry.     Findings: No erythema or rash.  Neurological:     Mental Status: He is alert and oriented to person, place, and time.  Psychiatric:        Behavior: Behavior normal.        Thought Content: Thought content normal.        Judgment: Judgment normal.      Lab Results  Component Value Date   WBC 4.2 05/08/2020   HGB 14.6 05/08/2020   HCT 41.5 05/08/2020   MCV 98.8 05/08/2020   PLT 230 05/08/2020     Chemistry      Component Value Date/Time   NA 139 05/08/2020 1008   NA 141 03/11/2018 0842   NA 138 12/30/2015 1307   K 4.5 05/08/2020 1008   K 4.1 12/30/2015 1307   CL 103 05/08/2020 1008   CO2 29 05/08/2020 1008   CO2 24 12/30/2015 1307   BUN 16 05/08/2020 1008   BUN 15 03/11/2018 0842   BUN 17.4 12/30/2015 1307   CREATININE 0.84 05/08/2020 1008   CREATININE 0.9 12/30/2015 1307      Component Value Date/Time   CALCIUM 9.9 05/08/2020 1008   CALCIUM 9.6 12/30/2015 1307   ALKPHOS 63 05/08/2020 1008   ALKPHOS 69 12/30/2015 1307   AST 21 05/08/2020 1008   AST 23 12/30/2015 1307   ALT 11 05/08/2020 1008   ALT 15 12/30/2015 1307   BILITOT 0.9 05/08/2020 1008   BILITOT 0.75 12/30/2015 1307      Impression and Plan: Mr. Paige is a 80 year old gentleman with hemochromatosis.  Everything is going quite well for him.  I am just very happy that he is doing well.  I will have to see what his iron saturation is.  This might be helpful for [place].  We will see him back in another year.  I think yearly follow-up is reasonable.  [name], MD 4/13/202211:20 AM

## 2020-05-08 NOTE — Telephone Encounter (Signed)
As noted below by Dr. Marin Olp, I informed the patient that he will need to have two phlebotomies due to his high iron levels. LOS sent to scheduling. He verbalized understanding.

## 2020-05-08 NOTE — Telephone Encounter (Signed)
appts made and printed for pt per 05/08/20 los   [employer]

## 2020-05-08 NOTE — Telephone Encounter (Signed)
Called pt per sch message for phlebot x2.  Pt questioned if we donate the blood and or if he could go to the [facility] to donate.  Per Erline Levine we, along  With the red cross would throw the blood away due to too much iron in his blood but he can go to one blood and they will keep it.  I left a vm for him with this information and per Erline Levine he will need a prescription for this and we can fax it to them  [facility]

## 2020-05-10 ENCOUNTER — Other Ambulatory Visit: Payer: PPO

## 2020-05-10 ENCOUNTER — Ambulatory Visit: Payer: PPO | Admitting: Hematology & Oncology

## 2020-05-17 ENCOUNTER — Inpatient Hospital Stay: Payer: PPO

## 2020-05-17 ENCOUNTER — Other Ambulatory Visit: Payer: Self-pay | Admitting: Family

## 2020-05-17 ENCOUNTER — Ambulatory Visit (HOSPITAL_COMMUNITY): Payer: PPO | Attending: Internal Medicine

## 2020-05-17 ENCOUNTER — Other Ambulatory Visit: Payer: Self-pay

## 2020-05-17 DIAGNOSIS — I10 Essential (primary) hypertension: Secondary | ICD-10-CM | POA: Diagnosis not present

## 2020-05-17 DIAGNOSIS — E785 Hyperlipidemia, unspecified: Secondary | ICD-10-CM

## 2020-05-17 DIAGNOSIS — I447 Left bundle-branch block, unspecified: Secondary | ICD-10-CM

## 2020-05-17 LAB — ECHOCARDIOGRAM COMPLETE
Area-P 1/2: 3.08 cm2
S' Lateral: 2.4 cm

## 2020-05-17 NOTE — Progress Notes (Signed)
[name] presents today for phlebotomy per MD orders. Phlebotomy procedure started at 1228 and ended at 1245. 510 grams removed from lt [facility] using 18g IV cath by Lyndal Pulley, RN.  Patient observed for 30 minutes after procedure without any incident. Patient tolerated procedure well. IV needle removed intact.

## 2020-05-17 NOTE — Patient Instructions (Signed)

## 2020-05-18 ENCOUNTER — Other Ambulatory Visit: Payer: Self-pay | Admitting: Hematology & Oncology

## 2020-05-20 ENCOUNTER — Telehealth: Payer: Self-pay | Admitting: *Deleted

## 2020-05-20 ENCOUNTER — Ambulatory Visit: Payer: PPO | Admitting: Cardiology

## 2020-05-20 DIAGNOSIS — I447 Left bundle-branch block, unspecified: Secondary | ICD-10-CM

## 2020-05-20 NOTE — Telephone Encounter (Signed)
Pt made aware of echo results and plan per Dr. Johney Frame. Pt aware that he will need another echo done in 2 years for follow-up of LBBB.  Order placed for echo to be done in 2 years, and pt is aware that our Echo [employer], will call him and arrange for this to be done.  Pt verbalized understanding and agrees with this plan.

## 2020-05-20 NOTE — Telephone Encounter (Signed)
-----   Message from Freada Bergeron, MD sent at 05/17/2020  8:55 PM EDT ----- His echo looks great with normal pumping function and no significant valve abnormalities. His heart relaxes a little abnormally which is very common. We just need to ensure his blood pressure is well controlled. We will continue to watch his pumping function with serial echoes given his LBBB every 2 years or so to ensure it remains stable.  Just in case he reads his report, the thickened moderator band is a completely incidental finding and does not affect him clinically. His heart is working great.

## 2020-05-24 ENCOUNTER — Other Ambulatory Visit: Payer: Self-pay

## 2020-05-24 ENCOUNTER — Inpatient Hospital Stay: Payer: PPO

## 2020-05-24 NOTE — Progress Notes (Signed)
Preston Reeves presents today for phlebotomy per MD orders. Phlebotomy procedure started at 1225 and ended at 1237.  520 cc removed via 16G needle at left antecubital site. Patient tolerated procedure well.

## 2020-05-24 NOTE — Patient Instructions (Signed)

## 2020-08-13 ENCOUNTER — Other Ambulatory Visit: Payer: Self-pay | Admitting: Hematology & Oncology

## 2020-10-12 ENCOUNTER — Other Ambulatory Visit: Payer: Self-pay

## 2020-10-12 ENCOUNTER — Other Ambulatory Visit: Payer: Self-pay | Admitting: Endocrinology

## 2020-10-12 DIAGNOSIS — E890 Postprocedural hypothyroidism: Secondary | ICD-10-CM

## 2020-10-21 ENCOUNTER — Telehealth: Payer: Self-pay | Admitting: Pharmacist

## 2020-10-21 ENCOUNTER — Other Ambulatory Visit: Payer: Self-pay

## 2020-10-21 DIAGNOSIS — I10 Essential (primary) hypertension: Secondary | ICD-10-CM

## 2020-10-21 MED ORDER — LOSARTAN POTASSIUM 50 MG PO TABS: 50.0000 mg | ORAL_TABLET | Freq: Two times a day (BID) | ORAL | 1 refills | Status: DC

## 2020-10-21 NOTE — Progress Notes (Signed)
    Chronic Care Management Pharmacy Assistant   Name: Preston Reeves  MRN: [id number] DOB: 01/21/1941  Reason for Encounter: Disease State - General Adherence  Recent office visits:  None noted  Recent consult visits:  05/08/20 [facility] (Oncology) - Hereditary hemochromatosis. No med changes.  05/03/20 [place] (Urology) - Elevated prostate specific antigen.  04/19/20 [place] (Cardiology) - F/u Essential hypertension. EKG done. No med changes.   Hospital visits:  None in previous 6 months  Medications: Outpatient Encounter Medications as of 10/21/2020  Medication Sig   albuterol (VENTOLIN HFA) 108 (90 Base) MCG/ACT inhaler Inhale 1-2 puffs into the lungs every 6 (six) hours as needed for wheezing or shortness of breath.   Ascorbic Acid (VITAMIN C) 1000 MG tablet Take 1,000 mg by mouth daily.   aspirin 81 MG tablet Take 81 mg by mouth daily.   Calcium Carbonate-Vitamin D (CALCIUM + D PO) Take 1 tablet by mouth daily.    Cholecalciferol (VITAMIN D) 2000 UNITS CAPS Take 2,000 Units by mouth daily.   Docusate Sodium (COLACE PO) Take 200 mg by mouth in the morning and at bedtime.   fexofenadine (ALLEGRA) 180 MG tablet Take 180 mg by mouth daily.   FIBER PO Take by mouth in the morning and at bedtime.   fluticasone (FLONASE) 50 MCG/ACT nasal spray Place 2 sprays into both nostrils daily.   Glucosamine-Chondroit-Vit C-Mn (GLUCOSAMINE 1500 COMPLEX PO) Take by mouth in the morning and at bedtime.   losartan (COZAAR) 50 MG tablet Take 1 tablet (50 mg total) by mouth 2 (two) times daily.   metoprolol succinate (TOPROL-XL) 50 MG 24 hr tablet TAKE 1 TABLET(50 MG) BY MOUTH DAILY WITH OR IMMEDIATELY FOLLOWING A MEAL   Multiple Vitamins-Minerals (MULTIVITAMIN PO) Take 1 tablet by mouth daily.    Omega-3 Fatty Acids (FISH OIL PO) Take 1,000 mg by mouth in the morning and at bedtime.   Red Yeast Rice 600 MG CAPS Take 1,200 mg by mouth daily.    SALINE NASAL SPRAY NA Place 1 spray into the nose daily as  needed (allergies).    SYNTHROID 150 MCG tablet TAKE 1 TABLET(150 MCG) BY MOUTH DAILY   vitamin B-12 (CYANOCOBALAMIN) 1000 MCG tablet Take 1,000 mcg by mouth daily.   No facility-administered encounter medications on file as of 10/21/2020.    Have you had any problems recently with your health? Patient states no problems with health.  Have you had any problems with your pharmacy? Patient states no problems with pharmacy.   What issues or side effects are you having with your medications? Patient states no issues or side effects.   What would you like me to pass along to [facility] for them to help you with?  Patient states no concerns at this time.   What can we do to take care of you better? Patient states nothing at this time.   Star Rating Drugs: Losartan 50 mg - filled 06/28/20 Berger, RMA Clinical Pharmacists Assistant [phone]  Time Spent: 15

## 2020-11-11 ENCOUNTER — Other Ambulatory Visit: Payer: Self-pay | Admitting: Hematology & Oncology

## 2020-12-12 NOTE — Patient Instructions (Addendum)
Blood work was ordered.     Medications changes include :   Anoro inhaler once a day.  Albuterol as needed  Your prescription(s) have been submitted to your pharmacy. Please take as directed and contact our office if you believe you are having problem(s) with the medication(s).    Please followup in 1 year   Health Maintenance, Male Adopting a healthy lifestyle and getting preventive care are important in promoting health and wellness. Ask your health care provider about: The right schedule for you to have regular tests and exams. Things you can do on your own to prevent diseases and keep yourself healthy. What should I know about diet, weight, and exercise? Eat a healthy diet  Eat a diet that includes plenty of vegetables, fruits, low-fat dairy products, and lean protein. Do not eat a lot of foods that are high in solid fats, added sugars, or sodium. Maintain a healthy weight Body mass index (BMI) is a measurement that can be used to identify possible weight problems. It estimates body fat based on height and weight. Your health care provider can help determine your BMI and help you achieve or maintain a healthy weight. Get regular exercise Get regular exercise. This is one of the most important things you can do for your health. Most adults should: Exercise for at least 150 minutes each week. The exercise should increase your heart rate and make you sweat (moderate-intensity exercise). Do strengthening exercises at least twice a week. This is in addition to the moderate-intensity exercise. Spend less time sitting. Even light physical activity can be beneficial. Watch cholesterol and blood lipids Have your blood tested for lipids and cholesterol at 80 years of age, then have this test every 5 years. You may need to have your cholesterol levels checked more often if: Your lipid or cholesterol levels are high. You are older than 80 years of age. You are at high risk for heart  disease. What should I know about cancer screening? Many types of cancers can be detected early and may often be prevented. Depending on your health history and family history, you may need to have cancer screening at various ages. This may include screening for: Colorectal cancer. Prostate cancer. Skin cancer. Lung cancer. What should I know about heart disease, diabetes, and high blood pressure? Blood pressure and heart disease High blood pressure causes heart disease and increases the risk of stroke. This is more likely to develop in people who have high blood pressure readings or are overweight. Talk with your health care provider about your target blood pressure readings. Have your blood pressure checked: Every 3-5 years if you are 36-74 years of age. Every year if you are 38 years old or older. If you are between the ages of 68 and 64 and are a current or former smoker, ask your health care provider if you should have a one-time screening for abdominal aortic aneurysm (AAA). Diabetes Have regular diabetes screenings. This checks your fasting blood sugar level. Have the screening done: Once every three years after age 32 if you are at a normal weight and have a low risk for diabetes. More often and at a younger age if you are overweight or have a high risk for diabetes. What should I know about preventing infection? Hepatitis B If you have a higher risk for hepatitis B, you should be screened for this virus. Talk with your health care provider to find out if you are at risk for hepatitis B infection.  Hepatitis C Blood testing is recommended for: Everyone born from [birth year] through 1965. Anyone with known risk factors for hepatitis C. Sexually transmitted infections (STIs) You should be screened each year for STIs, including gonorrhea and chlamydia, if: You are sexually active and are younger than 80 years of age. You are older than 80 years of age and your health care provider tells you  that you are at risk for this type of infection. Your sexual activity has changed since you were last screened, and you are at increased risk for chlamydia or gonorrhea. Ask your health care provider if you are at risk. Ask your health care provider about whether you are at high risk for HIV. Your health care provider may recommend a prescription medicine to help prevent HIV infection. If you choose to take medicine to prevent HIV, you should first get tested for HIV. You should then be tested every 3 months for as long as you are taking the medicine. Follow these instructions at home: Alcohol use Do not drink alcohol if your health care provider tells you not to drink. If you drink alcohol: Limit how much you have to 0-2 drinks a day. Know how much alcohol is in your drink. In the [place] one drink equals one 12 oz bottle of beer (355 mL), one 5 oz glass of wine (148 mL), or one 1 oz glass of hard liquor (44 mL). Lifestyle Do not use any products that contain nicotine or tobacco. These products include cigarettes, chewing tobacco, and vaping devices, such as e-cigarettes. If you need help quitting, ask your health care provider. Do not use street drugs. Do not share needles. Ask your health care provider for help if you need support or information about quitting drugs. General instructions Schedule regular health, dental, and eye exams. Stay current with your vaccines. Tell your health care provider if: You often feel depressed. You have ever been abused or do not feel safe at home. Summary Adopting a healthy lifestyle and getting preventive care are important in promoting health and wellness. Follow your health care provider's instructions about healthy diet, exercising, and getting tested or screened for diseases. Follow your health care provider's instructions on monitoring your cholesterol and blood pressure. This information is not intended to replace advice given to you by your health  care provider. Make sure you discuss any questions you have with your health care provider. Document Revised: 06/03/2020 Document Reviewed: 06/03/2020 Elsevier Patient Education  [place].

## 2020-12-12 NOTE — Progress Notes (Signed)
Subjective:    Patient ID: Preston Reeves, male    DOB: 06/14/1940, 80 y.o.   MRN: [id number]   This visit occurred during the SARS-CoV-2 public health emergency.  Safety protocols were in place, including screening questions prior to the visit, additional usage of staff PPE, and extensive cleaning of exam room while observing appropriate contact time as indicated for disinfecting solutions.   HPI He is here for a physical exam.   He is using his albuterol inhaler constantly - wheezing and SOB.  His symptoms may be worse in the spring or fall at times.  He is taking his allergy medication daily.  He denies any other concerns.  Medications and allergies reviewed with patient and updated if appropriate.  Patient Active Problem List   Diagnosis Date Noted   Hyperglycemia 12/04/2018   Diverticulitis of colon 10/09/2015   Elevated PSA 10/09/2015   PVC's (premature ventricular contractions) 07/25/2014   Essential hypertension 07/25/2014   Incisional hernias s/p lap/open VWH repair w mesh 06/20/2013   Hypothyroidism 12/28/2012   MVP (mitral valve prolapse)    Diverticulosis    History of BPH    Hemochromatosis    COPD (chronic obstructive pulmonary disease) ([place])     Current Outpatient Medications on File Prior to Visit  Medication Sig Dispense Refill   Ascorbic Acid (VITAMIN C) 1000 MG tablet Take 1,000 mg by mouth daily.     aspirin 81 MG tablet Take 81 mg by mouth daily.     Calcium Carbonate-Vitamin D (CALCIUM + D PO) Take 1 tablet by mouth daily.      Cholecalciferol (VITAMIN D) 2000 UNITS CAPS Take 2,000 Units by mouth daily.     Docusate Sodium (COLACE PO) Take 200 mg by mouth in the morning and at bedtime.     fexofenadine (ALLEGRA) 180 MG tablet Take 180 mg by mouth daily.     FIBER PO Take by mouth in the morning and at bedtime.     fluticasone (FLONASE) 50 MCG/ACT nasal spray Place 2 sprays into both nostrils daily. 16 g 6   Glucosamine-Chondroit-Vit C-Mn  (GLUCOSAMINE 1500 COMPLEX PO) Take by mouth in the morning and at bedtime.     losartan (COZAAR) 50 MG tablet Take 1 tablet (50 mg total) by mouth 2 (two) times daily. 180 tablet 1   metoprolol succinate (TOPROL-XL) 50 MG 24 hr tablet TAKE 1 TABLET(50 MG) BY MOUTH DAILY WITH OR IMMEDIATELY FOLLOWING A MEAL 90 tablet 0   Multiple Vitamins-Minerals (MULTIVITAMIN PO) Take 1 tablet by mouth daily.      Omega-3 Fatty Acids (FISH OIL PO) Take 1,000 mg by mouth in the morning and at bedtime.     Red Yeast Rice 600 MG CAPS Take 1,200 mg by mouth daily.      SALINE NASAL SPRAY NA Place 1 spray into the nose daily as needed (allergies).      SYNTHROID 150 MCG tablet TAKE 1 TABLET(150 MCG) BY MOUTH DAILY 90 tablet 0   vitamin B-12 (CYANOCOBALAMIN) 1000 MCG tablet Take 1,000 mcg by mouth daily.     No current facility-administered medications on file prior to visit.    Past Medical History:  Diagnosis Date   Allergy    SEASONAL   Asthma    Cataract    Colon polyps    Adenomatous   COPD (chronic obstructive pulmonary disease) ([place])    Diverticulitis of colon with perforation 2010   Colectomy/ostomy   Diverticulosis  hx of   Hemochromatosis    hx of   History of BPH    History of COPD    Hyperlipidemia    Hypothyroidism    Incisional hernia    MVP (mitral valve prolapse)    Thyroid disease     Past Surgical History:  Procedure Laterality Date   COLONOSCOPY  last 09/25/2013   COLONOSCOPY W/ BIOPSIES  2011   Dr Henrene Pastor   COLOSTOMY TAKEDOWN  2010   Dr Barry Dienes   INGUINAL HERNIA REPAIR Left 1999   Dr. Rebekah Chesterfield   INSERTION OF MESH N/A 10/13/2013   Procedure: INSERTION OF MESH;  Surgeon: Michael Boston, MD;  Location: [facility] OR;  Service: General;  Laterality: N/A;   LAPAROSCOPIC LYSIS OF ADHESIONS N/A 10/13/2013   Procedure: LAPAROSCOPIC LYSIS OF ADHESIONS;  Surgeon: Michael Boston, MD;  Location: [facility] OR;  Service: General;  Laterality: N/A;   PARTIAL COLECTOMY  08/12/2008   Hartmann; perforated colon  from diverticulitis   UPPER GI ENDOSCOPY     VENTRAL HERNIA REPAIR N/A 10/13/2013   Procedure: REPAIR OF VENTRAL WALL HERNIA WITH UNILATERAL COMPARTMENT SEPARATION;  Surgeon: Michael Boston, MD;  Location: [facility] OR;  Service: General;  Laterality: N/A;    Social History   Socioeconomic History   Marital status: Married    Spouse name: Not on file   Number of children: Not on file   Years of education: Not on file   Highest education level: Not on file  Occupational History   Occupation: Retired    Fish farm manager: RETIRED  Tobacco Use   Smoking status: Former    Types: Cigarettes    Quit date: 2005    Years since quitting: 17.8   Smokeless tobacco: Never  Vaping Use   Vaping Use: Never used  Substance and Sexual Activity   Alcohol use: Yes    Alcohol/week: 7.0 standard drinks    Types: 7 Cans of beer per week    Comment: drinks beer weekly   Drug use: No   Sexual activity: Yes  Other Topics Concern   Not on file  Social History Narrative   Married. Education: [employer].   Social Determinants of Health   Financial Resource Strain: Low Risk    Difficulty of Paying Living Expenses: Not hard at all  Food Insecurity: No Food Insecurity   Worried About Charity fundraiser in the Last Year: Never true   [place] in the Last Year: Never true  Transportation Needs: No Transportation Needs   Lack of Transportation (Medical): No   Lack of Transportation (Non-Medical): No  Physical Activity: Sufficiently Active   Days of Exercise per Week: 5 days   Minutes of Exercise per Session: 30 min  Stress: No Stress Concern Present   Feeling of Stress : Not at all  Social Connections: Socially Integrated   Frequency of Communication with Friends and Family: More than three times a week   Frequency of Social Gatherings with Friends and Family: More than three times a week   Attends Religious Services: More than 4 times per year   Active Member of [employer] or Organizations: Yes   Attends Arts development officer: More than 4 times per year   Marital Status: Married    Family History  Problem Relation Age of Onset   Breast cancer Mother    Colon cancer Father        53's   Heart disease Father    Diabetes Father  Heart attack Father    Stroke Neg Hx    Esophageal cancer Neg Hx    Rectal cancer Neg Hx    Stomach cancer Neg Hx    Colon polyps Neg Hx     Review of Systems  Constitutional:  Negative for chills and fever.  HENT:  Negative for postnasal drip.   Eyes:  Negative for visual disturbance.  Respiratory:  Positive for shortness of breath and wheezing. Negative for cough and chest tightness.   Cardiovascular:  Negative for chest pain and palpitations.  Gastrointestinal:  Negative for abdominal pain, blood in stool, constipation, diarrhea and nausea.       No gerd  Genitourinary:  Negative for difficulty urinating, dysuria and hematuria.  Musculoskeletal:  Negative for arthralgias and back pain.  Skin:  Negative for color change and rash.  Neurological:  Negative for dizziness, light-headedness and headaches.  Psychiatric/Behavioral:  Negative for dysphoric mood. The patient is not nervous/anxious.       Objective:   Vitals:   12/13/20 1004  BP: 130/70  Pulse: 75  Temp: 98.3 F (36.8 C)  SpO2: 98%   Filed Weights   12/13/20 1004  Weight: 177 lb 9.6 oz (80.6 kg)   Body mass index is 25.12 kg/m.  BP Readings from Last 3 Encounters:  12/13/20 130/70  12/13/20 130/70  05/24/20 (!) 109/39    Wt Readings from Last 3 Encounters:  12/13/20 177 lb 9.6 oz (80.6 kg)  12/13/20 177 lb 9.6 oz (80.6 kg)  05/08/20 179 lb (81.2 kg)     Physical Exam Constitutional: He appears well-developed and well-nourished. No distress.  HENT:  Head: Normocephalic and atraumatic.  Right Ear: External ear normal.  Left Ear: External ear normal.  Mouth/Throat: Oropharynx is clear and moist.  Normal ear canals bilaterally normal.  Excessive cerumen in both ear  canals-TM bilaterally not visualized   Eyes: Conjunctivae and EOM are normal.  Neck: Neck supple. No tracheal deviation present. No thyromegaly present.  No carotid bruit  Cardiovascular: Normal rate, regular rhythm, normal heart sounds and intact distal pulses.  No murmur heard. Pulmonary/Chest: Effort normal and breath sounds normal. No respiratory distress.  Couple of mild expiratory wheezes heard.  He has no rales.  Abdominal: Soft. He exhibits no distension. There is no tenderness.  Genitourinary: deferred  Musculoskeletal: He exhibits no edema.  Lymphadenopathy:   He has no cervical adenopathy.  Skin: Skin is warm and dry. He is not diaphoretic.  Psychiatric: He has a normal mood and affect. His behavior is normal.         Assessment & Plan:   Physical exam: Screening blood work  ordered Exercise   active-no regimented exercise Weight normal Substance abuse   none   Reviewed recommended immunizations.   Health Maintenance  Topic Date Due   COVID-19 Vaccine (3 - Booster for Pfizer series) 12/29/2020 (Originally 07/12/2019)   Zoster Vaccines- Shingrix (1 of 2) 03/15/2021 (Originally 02/26/1990)   TETANUS/TDAP  06/21/2023   COLONOSCOPY (Pts 45-78yrs Insurance coverage will need to be confirmed)  10/11/2023   Pneumonia Vaccine 29+ Years old  Completed   INFLUENZA VACCINE  Completed   HPV VACCINES  Aged Out     See Problem List for Assessment and Plan of chronic medical problems.

## 2020-12-13 ENCOUNTER — Other Ambulatory Visit: Payer: Self-pay

## 2020-12-13 ENCOUNTER — Ambulatory Visit (INDEPENDENT_AMBULATORY_CARE_PROVIDER_SITE_OTHER): Payer: PPO | Admitting: Internal Medicine

## 2020-12-13 ENCOUNTER — Encounter: Payer: Self-pay | Admitting: Internal Medicine

## 2020-12-13 ENCOUNTER — Ambulatory Visit (INDEPENDENT_AMBULATORY_CARE_PROVIDER_SITE_OTHER): Payer: PPO

## 2020-12-13 VITALS — BP 130/70 | HR 75 | Temp 98.3°F | Ht 70.5 in | Wt 177.6 lb

## 2020-12-13 DIAGNOSIS — R739 Hyperglycemia, unspecified: Secondary | ICD-10-CM

## 2020-12-13 DIAGNOSIS — Z0000 Encounter for general adult medical examination without abnormal findings: Secondary | ICD-10-CM

## 2020-12-13 DIAGNOSIS — Z23 Encounter for immunization: Secondary | ICD-10-CM | POA: Diagnosis not present

## 2020-12-13 DIAGNOSIS — E039 Hypothyroidism, unspecified: Secondary | ICD-10-CM

## 2020-12-13 DIAGNOSIS — I10 Essential (primary) hypertension: Secondary | ICD-10-CM | POA: Diagnosis not present

## 2020-12-13 DIAGNOSIS — J449 Chronic obstructive pulmonary disease, unspecified: Secondary | ICD-10-CM | POA: Diagnosis not present

## 2020-12-13 DIAGNOSIS — H6123 Impacted cerumen, bilateral: Secondary | ICD-10-CM

## 2020-12-13 LAB — CBC WITH DIFFERENTIAL/PLATELET
Basophils Absolute: 0 10*3/uL (ref 0.0–0.1)
Basophils Relative: 0.9 % (ref 0.0–3.0)
Eosinophils Absolute: 0.2 10*3/uL (ref 0.0–0.7)
Eosinophils Relative: 6.2 % — ABNORMAL HIGH (ref 0.0–5.0)
HCT: 38.9 % — ABNORMAL LOW (ref 39.0–52.0)
Hemoglobin: 13.1 g/dL (ref 13.0–17.0)
Lymphocytes Relative: 19.8 % (ref 12.0–46.0)
Lymphs Abs: 0.6 10*3/uL — ABNORMAL LOW (ref 0.7–4.0)
MCHC: 33.6 g/dL (ref 30.0–36.0)
MCV: 98.1 fl (ref 78.0–100.0)
Monocytes Absolute: 0.5 10*3/uL (ref 0.1–1.0)
Monocytes Relative: 14.2 % — ABNORMAL HIGH (ref 3.0–12.0)
Neutro Abs: 1.9 10*3/uL (ref 1.4–7.7)
Neutrophils Relative %: 58.9 % (ref 43.0–77.0)
Platelets: 217 10*3/uL (ref 150.0–400.0)
RBC: 3.96 Mil/uL — ABNORMAL LOW (ref 4.22–5.81)
RDW: 14.2 % (ref 11.5–15.5)
WBC: 3.2 10*3/uL — ABNORMAL LOW (ref 4.0–10.5)

## 2020-12-13 LAB — COMPREHENSIVE METABOLIC PANEL
ALT: 13 U/L (ref 0–53)
AST: 22 U/L (ref 0–37)
Albumin: 4.1 g/dL (ref 3.5–5.2)
Alkaline Phosphatase: 64 U/L (ref 39–117)
BUN: 16 mg/dL (ref 6–23)
CO2: 29 mEq/L (ref 19–32)
Calcium: 9.5 mg/dL (ref 8.4–10.5)
Chloride: 105 mEq/L (ref 96–112)
Creatinine, Ser: 0.9 mg/dL (ref 0.40–1.50)
GFR: 80.5 mL/min (ref >60.00)
Glucose, Bld: 102 mg/dL — ABNORMAL HIGH (ref 70–99)
Potassium: 4 mEq/L (ref 3.5–5.1)
Sodium: 139 mEq/L (ref 135–145)
Total Bilirubin: 0.7 mg/dL (ref 0.2–1.2)
Total Protein: 7.4 g/dL (ref 6.0–8.3)

## 2020-12-13 LAB — LIPID PANEL
Cholesterol: 143 mg/dL (ref 0–200)
HDL: 43.6 mg/dL (ref >39.00)
LDL Cholesterol: 85 mg/dL (ref 0–99)
NonHDL: 99.72
Total CHOL/HDL Ratio: 3
Triglycerides: 72 mg/dL (ref 0.0–149.0)
VLDL: 14.4 mg/dL (ref 0.0–40.0)

## 2020-12-13 LAB — TSH: TSH: 0.82 u[IU]/mL (ref 0.35–5.50)

## 2020-12-13 LAB — HEMOGLOBIN A1C: Hgb A1c MFr Bld: 5.9 % (ref 4.6–6.5)

## 2020-12-13 MED ORDER — ALBUTEROL SULFATE HFA 108 (90 BASE) MCG/ACT IN AERS: 1.0000 | INHALATION_SPRAY | Freq: Four times a day (QID) | RESPIRATORY_TRACT | 8 refills | Status: DC | PRN

## 2020-12-13 MED ORDER — UMECLIDINIUM-VILANTEROL 62.5-25 MCG/ACT IN AEPB: 1.0000 | INHALATION_SPRAY | Freq: Every day | RESPIRATORY_TRACT | 11 refills | Status: DC

## 2020-12-13 NOTE — Progress Notes (Signed)
Subjective:   Preston Reeves is a 80 y.o. male who presents for Medicare Annual/Subsequent preventive examination.  Review of Systems     Cardiac Risk Factors include: advanced age (>20men, >40 women);family history of premature cardiovascular disease;hypertension;male gender     Objective:    Today's Vitals   12/13/20 0952  BP: 130/70  Pulse: 75  Temp: 98.3 F (36.8 C)  SpO2: 98%  Weight: 177 lb 9.6 oz (80.6 kg)  Height: 5' 10.5" (1.791 m)  PainSc: 0-No pain   Body mass index is 25.12 kg/m.  Advanced Directives 12/13/2020 05/10/2019 12/27/2017 12/28/2016 12/30/2015 12/31/2014 01/01/2014  Does Patient Have a Medical Advance Directive? Yes No No No Yes No Yes  Type of Advance Directive Living will;Healthcare Power of Attorney - - - Living will;Healthcare Power of [place];Living will  Does patient want to make changes to medical advance directive? No - Patient declined - - - - - No - Patient declined  Copy of [place] in Chart? No - copy requested - - - No - copy requested - No - copy requested  Would patient like information on creating a medical advance directive? - No - Patient declined - No - Patient declined - No - patient declined information -    Current Medications (verified) Outpatient Encounter Medications as of 12/13/2020  Medication Sig   albuterol (VENTOLIN HFA) 108 (90 Base) MCG/ACT inhaler Inhale 1-2 puffs into the lungs every 6 (six) hours as needed for wheezing or shortness of breath.   Ascorbic Acid (VITAMIN C) 1000 MG tablet Take 1,000 mg by mouth daily.   aspirin 81 MG tablet Take 81 mg by mouth daily.   Calcium Carbonate-Vitamin D (CALCIUM + D PO) Take 1 tablet by mouth daily.    Cholecalciferol (VITAMIN D) 2000 UNITS CAPS Take 2,000 Units by mouth daily.   Docusate Sodium (COLACE PO) Take 200 mg by mouth in the morning and at bedtime.   fexofenadine (ALLEGRA) 180 MG tablet Take 180 mg by mouth daily.    FIBER PO Take by mouth in the morning and at bedtime.   fluticasone (FLONASE) 50 MCG/ACT nasal spray Place 2 sprays into both nostrils daily.   Glucosamine-Chondroit-Vit C-Mn (GLUCOSAMINE 1500 COMPLEX PO) Take by mouth in the morning and at bedtime.   losartan (COZAAR) 50 MG tablet Take 1 tablet (50 mg total) by mouth 2 (two) times daily.   metoprolol succinate (TOPROL-XL) 50 MG 24 hr tablet TAKE 1 TABLET(50 MG) BY MOUTH DAILY WITH OR IMMEDIATELY FOLLOWING A MEAL   Multiple Vitamins-Minerals (MULTIVITAMIN PO) Take 1 tablet by mouth daily.    Omega-3 Fatty Acids (FISH OIL PO) Take 1,000 mg by mouth in the morning and at bedtime.   Red Yeast Rice 600 MG CAPS Take 1,200 mg by mouth daily.    SALINE NASAL SPRAY NA Place 1 spray into the nose daily as needed (allergies).    SYNTHROID 150 MCG tablet TAKE 1 TABLET(150 MCG) BY MOUTH DAILY   vitamin B-12 (CYANOCOBALAMIN) 1000 MCG tablet Take 1,000 mcg by mouth daily.   No facility-administered encounter medications on file as of 12/13/2020.    Allergies (verified) Patient has no known allergies.   History: Past Medical History:  Diagnosis Date   Allergy    SEASONAL   Asthma    Cataract    Colon polyps    Adenomatous   COPD (chronic obstructive pulmonary disease) (HCC)    Diverticulitis of colon with  perforation 2010   Colectomy/ostomy   Diverticulosis    hx of   Hemochromatosis    hx of   History of BPH    History of COPD    Hyperlipidemia    Hypothyroidism    Incisional hernia    MVP (mitral valve prolapse)    Thyroid disease    Past Surgical History:  Procedure Laterality Date   COLONOSCOPY  last 09/25/2013   COLONOSCOPY W/ BIOPSIES  2011   Dr Henrene Pastor   COLOSTOMY TAKEDOWN  2010   Dr Barry Dienes   INGUINAL HERNIA REPAIR Left 1999   Dr. Rebekah Chesterfield   INSERTION OF MESH N/A 10/13/2013   Procedure: INSERTION OF MESH;  Surgeon: Michael Boston, MD;  Location: [facility] OR;  Service: General;  Laterality: N/A;   LAPAROSCOPIC LYSIS OF ADHESIONS N/A  10/13/2013   Procedure: LAPAROSCOPIC LYSIS OF ADHESIONS;  Surgeon: Michael Boston, MD;  Location[facility] OR;  Service: General;  Laterality: N/A;   PARTIAL COLECTOMY  08/12/2008   [facility]mann; perforated colon from diverticulitis   UPPER GI ENDOSCOPY     VENTRAL HERNIA REPAIR N/A 10/13/2013   Procedure: REPAIR OF VENTRAL WALL HERNIA WITH UNILATERAL COMPARTMENT SEPARATION;  Surgeon: Michael Boston, MD;  Location: [place];  Service: General;  Laterality: N/A;   Family History  Problem Relation Age of Onset   Breast cancer Mother    Colon cancer Father        31's   Heart disease Father    Diabetes Father    Heart attack Father    Stroke Neg Hx    Esophageal cancer Neg Hx    Rectal cancer Neg Hx    Stomach cancer Neg Hx    Colon polyps Neg Hx    Social History   Socioeconomic History   Marital status: Married    Spouse name: Not on file   Number of children: Not on file   Years of education: Not on file   Highest education level: Not on file  Occupational History   Occupation: Retired    Fish farm manager: RETIRED  Tobacco Use   Smoking status: Former    Types: Cigarettes    Quit date: 2005    Years since quitting: 17.8   Smokeless tobacco: Never  Vaping Use   Vaping Use: Never used  Substance and Sexual Activity   Alcohol use: Yes    Alcohol/week: 7.0 standard drinks    Types: 7 Cans of beer per week    Comment: drinks beer weekly   Drug use: No   Sexual activity: Yes  Other Topics Concern   Not on file  Social History Narrative   Married. Education: [employer].   Social Determinants of Health   Financial Resource Strain: Low Risk    Difficulty of Paying Living Expenses: Not hard at all  Food Insecurity: No Food Insecurity   Worried About Charity fundraiser in the Last Year: Never true   [place] in the Last Year: Never true  Transportation Needs: No Transportation Needs   Lack of Transportation (Medical): No   Lack of Transportation (Non-Medical): No  Physical Activity:  Sufficiently Active   Days of Exercise per Week: 5 days   Minutes of Exercise per Session: 30 min  Stress: No Stress Concern Present   Feeling of Stress : Not at all  Social Connections: Socially Integrated   Frequency of Communication with Friends and Family: More than three times a week   Frequency of Social Gatherings with  Friends and Family: More than three times a week   Attends Religious Services: More than 4 times per year   Active Member of Clubs or Organizations: Yes   Attends Music therapist: More than 4 times per year   Marital Status: Married    Tobacco Counseling Counseling given: Not Answered   Clinical Intake:  Pre-visit preparation completed: Yes  Pain : No/denies pain Pain Score: 0-No pain     BMI - recorded: 25.12 Nutritional Status: BMI 25 -29 Overweight Nutritional Risks: None Diabetes: No  How often do you need to have someone help you when you read instructions, pamphlets, or other written materials from your doctor or pharmacy?: 1 - Never What is the last grade level you completed in school?: Bachelor's Degree in Engineering  Diabetic? no  Interpreter Needed?: No  Information entered by :: Lisette Abu, LPN   Activities of Daily Living In your present state of health, do you have any difficulty performing the following activities: 12/13/2020  Hearing? N  Vision? N  Difficulty concentrating or making decisions? N  Walking or climbing stairs? N  Dressing or bathing? N  Doing errands, shopping? N  Preparing Food and eating ? N  Using the Toilet? N  In the past six months, have you accidently leaked urine? N  Do you have problems with loss of bowel control? N  Managing your Medications? N  Managing your Finances? N  Housekeeping or managing your Housekeeping? N  Some recent data might be hidden    Patient Care Team: Binnie Rail, MD as PCP - General (Internal Medicine) Dorothy Spark, MD (Inactive) as PCP -  Cardiology (Cardiology) Raynelle Bring, MD as Consulting Physician (Urology) Irene Shipper, MD as Consulting Physician (Gastroenterology) Darlin Coco, MD as Consulting Physician (Cardiology) Charlton Haws, [facility] as Pharmacist (Pharmacist)  Indicate any recent Medical Services you may have received from other than [facility] providers in the past year (date may be approximate).     Assessment:   This is a routine wellness examination for Jamesyn.  Hearing/Vision screen Hearing Screening - Comments:: Patient has decreased hearing in right ear. No hearing aids. Vision Screening - Comments:: Patient wears corrective lenses. Annual eye exam done by Dr. Alois Cliche.  Dietary issues and exercise activities discussed: Current Exercise Habits: Home exercise routine, Type of exercise: walking;Other - see comments (plays golf), Time (Minutes): 30, Frequency (Times/Week): 5, Weekly Exercise (Minutes/Week): 150, Intensity: Moderate   Goals Addressed   None   Depression Screen PHQ 2/9 Scores 12/13/2020 12/13/2019 12/05/2018 12/03/2017 11/23/2016 06/06/2015 06/07/2014  PHQ - 2 Score 0 0 0 0 0 0 0  PHQ- 9 Score - - - 0 - - -    Fall Risk Fall Risk  12/13/2020 12/16/2018 12/05/2018 12/03/2017 11/23/2016  Falls in the past year? 0 0 0 0 No  Comment - Emmi Telephone Survey: data to providers prior to load - - -  Number falls in past yr: 0 - 0 0 -  Injury with Fall? 0 - - - -    FALL RISK PREVENTION PERTAINING TO THE HOME:  Any stairs in or around the home? Yes  If so, are there any without handrails? No  Home free of loose throw rugs in walkways, pet beds, electrical cords, etc? Yes  Adequate lighting in your home to reduce risk of falls? Yes   ASSISTIVE DEVICES UTILIZED TO PREVENT FALLS:  Life alert? No  Use of a cane, walker or w/c? No  Grab bars in the bathroom? Yes  Shower chair or bench in shower? Yes  Elevated toilet seat or a handicapped toilet? Yes   TIMED UP AND GO:  Was the  test performed? Yes .  Length of time to ambulate 10 feet: 6 sec.   Gait steady and fast without use of assistive device  Cognitive Function: Normal cognitive status assessed by direct observation by this Nurse Health Advisor. No abnormalities found.          Immunizations Immunization History  Administered Date(s) Administered   Fluad Quad(high Dose 65+) 12/05/2018, 12/13/2019   Influenza, High Dose Seasonal PF 09/15/2016, 12/03/2017   Influenza,inj,Quad PF,6+ Mos 10/14/2013   Influenza-Unspecified 12/26/2012, 10/28/2014, 10/06/2015, 09/30/2016   PFIZER(Purple Top)SARS-COV-2 Vaccination 04/15/2019, 05/17/2019   Pneumococcal Conjugate-13 06/07/2014   Pneumococcal Polysaccharide-23 11/27/2011   Td 06/20/2013    TDAP status: Up to date  Flu Vaccine status: Completed at today's visit  Pneumococcal vaccine status: Up to date  Covid-19 vaccine status: Completed vaccines  Qualifies for Shingles Vaccine? Yes   Zostavax completed No   Shingrix Completed?: No.    Education has been provided regarding the importance of this vaccine. Patient has been advised to call insurance company to determine out of pocket expense if they have not yet received this vaccine. Advised may also receive vaccine at local pharmacy or Health Dept. Verbalized acceptance and understanding.  Screening Tests Health Maintenance  Topic Date Due   Zoster Vaccines- Shingrix (1 of 2) Never done   COVID-19 Vaccine (3 - Booster for Pfizer series) 07/12/2019   INFLUENZA VACCINE  08/26/2020   TETANUS/TDAP  06/21/2023   COLONOSCOPY (Pts 45-60yrs Insurance coverage will need to be confirmed)  10/11/2023   Pneumonia Vaccine 12+ Years old  Completed   HPV VACCINES  Aged Out    Health Maintenance  Health Maintenance Due  Topic Date Due   Zoster Vaccines- Shingrix (1 of 2) Never done   COVID-19 Vaccine (3 - Booster for Pfizer series) 07/12/2019   INFLUENZA VACCINE  08/26/2020    Colorectal cancer screening: No  longer required.   Lung Cancer Screening: (Low Dose CT Chest recommended if Age 16-80 years, 30 pack-year currently smoking OR have quit w/in 15years.) does not qualify.   Lung Cancer Screening Referral: no  Additional Screening:  Hepatitis C Screening: does not qualify; Completed: no  Vision Screening: Recommended annual ophthalmology exams for early detection of glaucoma and other disorders of the eye. Is the patient up to date with their annual eye exam?  Yes  Who is the provider or what is the name of the office in which the patient attends annual eye exams? Alois Cliche, OD. If pt is not established with a provider, would they like to be referred to a provider to establish care? No .   Dental Screening: Recommended annual dental exams for proper oral hygiene  Community Resource Referral / Chronic Care Management: CRR required this visit?  No   CCM required this visit?  No      Plan:     I have personally reviewed and noted the following in the patient's chart:   Medical and social history Use of alcohol, tobacco or illicit drugs  Current medications and supplements including opioid prescriptions. Patient is not currently taking opioid prescriptions. Functional ability and status Nutritional status Physical activity Advanced directives List of other physicians Hospitalizations, surgeries, and ER visits in previous 12 months Vitals Screenings to include cognitive, depression, and falls Referrals and appointments  In addition, I have reviewed and discussed with patient certain preventive protocols, quality metrics, and best practice recommendations. A written personalized care plan for preventive services as well as general preventive health recommendations were provided to patient.     Sheral Flow, LPN   [id number]   Nurse Notes:  Hearing Screening - Comments:: Patient has decreased hearing in right ear. No hearing aids. Vision Screening - Comments:: Patient  wears corrective lenses. Annual eye exam done by Dr. Alois Cliche.

## 2020-12-13 NOTE — Assessment & Plan Note (Signed)
Chronic Blood pressure well controlled CMP Continue losartan 50 mg twice daily, metoprolol XL 50 mg daily

## 2020-12-13 NOTE — Assessment & Plan Note (Signed)
Chronic  Clinically euthyroid Currently taking Synthroid 150 mcg daily Has not seen endocrine in a while Check tsh  Titrate med dose if needed

## 2020-12-13 NOTE — Assessment & Plan Note (Signed)
Chronic Check a1c Low sugar / carb diet 

## 2020-12-13 NOTE — Assessment & Plan Note (Addendum)
Chronic Not controlled He does have shortness of breath and wheezing on a fairly daily basis and has been using his albuterol inhaler several times a day He needs to be on a maintenance inhaler-trial of Anoro once daily, can continue albuterol as needed, but should not be using this more than twice a week Advised him and his wife that if his symptoms are not much improved may need to let me know and we may need to try different inhaler Continue allergy medications He denies any GERD

## 2020-12-13 NOTE — Patient Instructions (Signed)
Preston Reeves , Thank you for taking time to come for your Medicare Wellness Visit. I appreciate your ongoing commitment to your health goals. Please review the following plan we discussed and let me know if I can assist you in the future.   Screening recommendations/referrals: Colonoscopy: Not a candidate for screening due to age Recommended yearly ophthalmology/optometry visit for glaucoma screening and checkup Recommended yearly dental visit for hygiene and checkup  Vaccinations: Influenza vaccine: 12/13/2020 Pneumococcal vaccine: 11/27/2011, 06/07/2014 Tdap vaccine: 06/20/2013; due every 10 years Shingles vaccine: never done   Covid-19: 04/15/2019, 05/17/2019, 01/02/2020  Advanced directives: Please bring a copy of your health care power of attorney and living will to the office at your convenience.  Conditions/risks identified: Yes; Client understands the importance of follow-up with providers by attending scheduled visits and discussed goals to eat healthier, increase physical activity, exercise the brain, socialize more, get enough sleep and make time for laughter.  Next appointment: Please schedule your next Medicare Wellness Visit with your Nurse Health Advisor in 1 year by calling [phone].  Preventive Care 2 Years and Older, Male Preventive care refers to lifestyle choices and visits with your health care provider that can promote health and wellness. What does preventive care include? A yearly physical exam. This is also called an annual well check. Dental exams once or twice a year. Routine eye exams. Ask your health care provider how often you should have your eyes checked. Personal lifestyle choices, including: Daily care of your teeth and gums. Regular physical activity. Eating a healthy diet. Avoiding tobacco and drug use. Limiting alcohol use. Practicing safe sex. Taking low doses of aspirin every day. Taking vitamin and mineral supplements as recommended by your  health care provider. What happens during an annual well check? The services and screenings done by your health care provider during your annual well check will depend on your age, overall health, lifestyle risk factors, and family history of disease. Counseling  Your health care provider may ask you questions about your: Alcohol use. Tobacco use. Drug use. Emotional well-being. Home and relationship well-being. Sexual activity. Eating habits. History of falls. Memory and ability to understand (cognition). Work and work Statistician. Screening  You may have the following tests or measurements: Height, weight, and BMI. Blood pressure. Lipid and cholesterol levels. These may be checked every 5 years, or more frequently if you are over 35 years old. Skin check. Lung cancer screening. You may have this screening every year starting at age 77 if you have a 30-pack-year history of smoking and currently smoke or have quit within the past 15 years. Fecal occult blood test (FOBT) of the stool. You may have this test every year starting at age 32. Flexible sigmoidoscopy or colonoscopy. You may have a sigmoidoscopy every 5 years or a colonoscopy every 10 years starting at age 91. Prostate cancer screening. Recommendations will vary depending on your family history and other risks. Hepatitis C blood test. Hepatitis B blood test. Sexually transmitted disease (STD) testing. Diabetes screening. This is done by checking your blood sugar (glucose) after you have not eaten for a while (fasting). You may have this done every 1-3 years. Abdominal aortic aneurysm (AAA) screening. You may need this if you are a current or former smoker. Osteoporosis. You may be screened starting at age 45 if you are at high risk. Talk with your health care provider about your test results, treatment options, and if necessary, the need for more tests. Vaccines  Your health care  provider may recommend certain vaccines, such  as: Influenza vaccine. This is recommended every year. Tetanus, diphtheria, and acellular pertussis (Tdap, Td) vaccine. You may need a Td booster every 10 years. Zoster vaccine. You may need this after age 69. Pneumococcal 13-valent conjugate (PCV13) vaccine. One dose is recommended after age 89. Pneumococcal polysaccharide (PPSV23) vaccine. One dose is recommended after age 12. Talk to your health care provider about which screenings and vaccines you need and how often you need them. This information is not intended to replace advice given to you by your health care provider. Make sure you discuss any questions you have with your health care provider. Document Released: 02/08/2015 Document Revised: 10/02/2015 Document Reviewed: 11/13/2014 Elsevier Interactive Patient Education  2017 [place] Prevention in the Home Falls can cause injuries. They can happen to people of all ages. There are many things you can do to make your home safe and to help prevent falls. What can I do on the outside of my home? Regularly fix the edges of walkways and driveways and fix any cracks. Remove anything that might make you trip as you walk through a door, such as a raised step or threshold. Trim any bushes or trees on the path to your home. Use bright outdoor lighting. Clear any walking paths of anything that might make someone trip, such as rocks or tools. Regularly check to see if handrails are loose or broken. Make sure that both sides of any steps have handrails. Any raised decks and porches should have guardrails on the edges. Have any leaves, snow, or ice cleared regularly. Use sand or salt on walking paths during winter. Clean up any spills in your garage right away. This includes oil or grease spills. What can I do in the bathroom? Use night lights. Install grab bars by the toilet and in the tub and shower. Do not use towel bars as grab bars. Use non-skid mats or decals in the tub or  shower. If you need to sit down in the shower, use a plastic, non-slip stool. Keep the floor dry. Clean up any water that spills on the floor as soon as it happens. Remove soap buildup in the tub or shower regularly. Attach bath mats securely with double-sided non-slip rug tape. Do not have throw rugs and other things on the floor that can make you trip. What can I do in the bedroom? Use night lights. Make sure that you have a light by your bed that is easy to reach. Do not use any sheets or blankets that are too big for your bed. They should not hang down onto the floor. Have a firm chair that has side arms. You can use this for support while you get dressed. Do not have throw rugs and other things on the floor that can make you trip. What can I do in the kitchen? Clean up any spills right away. Avoid walking on wet floors. Keep items that you use a lot in easy-to-reach places. If you need to reach something above you, use a strong step stool that has a grab bar. Keep electrical cords out of the way. Do not use floor polish or wax that makes floors slippery. If you must use wax, use non-skid floor wax. Do not have throw rugs and other things on the floor that can make you trip. What can I do with my stairs? Do not leave any items on the stairs. Make sure that there are handrails on both  sides of the stairs and use them. Fix handrails that are broken or loose. Make sure that handrails are as long as the stairways. Check any carpeting to make sure that it is firmly attached to the stairs. Fix any carpet that is loose or worn. Avoid having throw rugs at the top or bottom of the stairs. If you do have throw rugs, attach them to the floor with carpet tape. Make sure that you have a light switch at the top of the stairs and the bottom of the stairs. If you do not have them, ask someone to add them for you. What else can I do to help prevent falls? Wear shoes that: Do not have high heels. Have  rubber bottoms. Are comfortable and fit you well. Are closed at the toe. Do not wear sandals. If you use a stepladder: Make sure that it is fully opened. Do not climb a closed stepladder. Make sure that both sides of the stepladder are locked into place. Ask someone to hold it for you, if possible. Clearly mark and make sure that you can see: Any grab bars or handrails. First and last steps. Where the edge of each step is. Use tools that help you move around (mobility aids) if they are needed. These include: Canes. Walkers. Scooters. Crutches. Turn on the lights when you go into a dark area. Replace any light bulbs as soon as they burn out. Set up your furniture so you have a clear path. Avoid moving your furniture around. If any of your floors are uneven, fix them. If there are any pets around you, be aware of where they are. Review your medicines with your doctor. Some medicines can make you feel dizzy. This can increase your chance of falling. Ask your doctor what other things that you can do to help prevent falls. This information is not intended to replace advice given to you by your health care provider. Make sure you discuss any questions you have with your health care provider. Document Released: 11/08/2008 Document Revised: 06/20/2015 Document Reviewed: 02/16/2014 Elsevier Interactive Patient Education  2017 [employer].

## 2021-01-10 ENCOUNTER — Other Ambulatory Visit: Payer: Self-pay | Admitting: Endocrinology

## 2021-01-10 DIAGNOSIS — E890 Postprocedural hypothyroidism: Secondary | ICD-10-CM

## 2021-01-16 ENCOUNTER — Telehealth: Payer: Self-pay | Admitting: Internal Medicine

## 2021-01-16 ENCOUNTER — Telehealth: Payer: Self-pay

## 2021-01-16 NOTE — Telephone Encounter (Signed)
1.Medication Requested: SYNTHROID 150 MCG tablet  2. Pharmacy (Name, [place]): [facility] [place], [place] [facility] [place]  Phone:  [phone] Fax:  [phone]   3. On Med List: yes  4. Last Visit with PCP: 11.18.22  5. Next visit date with PCP: n/a   Agent: Please be advised that RX refills may take up to 3 business days. We ask that you follow-up with your pharmacy.

## 2021-01-16 NOTE — Telephone Encounter (Signed)
Patient sent message stating his synthroid medication was denied for refills. Per Dr Dwyane Dee patient stated he was going to see his PCP for thyroid and if that is the case PCP would need to send mediction in. If not he can schedule visit with Dr Dwyane Dee and we can renew Rx.   I called patient to give him this information. He stated he will reach out to PCP and see if they will send Rx in.

## 2021-01-17 NOTE — Telephone Encounter (Signed)
Spoke with the patient and says that he was able to get in contact with his PCP to receive refills of his Synthroid.

## 2021-01-28 ENCOUNTER — Other Ambulatory Visit: Payer: Self-pay | Admitting: Endocrinology

## 2021-01-28 ENCOUNTER — Telehealth: Payer: Self-pay

## 2021-01-28 DIAGNOSIS — E890 Postprocedural hypothyroidism: Secondary | ICD-10-CM

## 2021-01-28 MED ORDER — SYNTHROID 150 MCG PO TABS: ORAL_TABLET | ORAL | 1 refills | Status: DC

## 2021-01-28 NOTE — Telephone Encounter (Signed)
Pt is calling in requesting a refill for SYNTHROID 150 MCG tablet. Pt states that Dr. Dwyane Dee will not refill Rx bc he hasnt been seen and advised him to ask PCP to refill since he was seem in 11/22.   Please advise pt if this request will be complete as the pt has 1 week worth of medication.

## 2021-01-28 NOTE — Telephone Encounter (Signed)
Rx sent to pof

## 2021-02-05 ENCOUNTER — Other Ambulatory Visit: Payer: Self-pay | Admitting: Hematology & Oncology

## 2021-04-09 ENCOUNTER — Telehealth: Payer: PPO

## 2021-04-14 ENCOUNTER — Other Ambulatory Visit: Payer: Self-pay

## 2021-04-14 DIAGNOSIS — I10 Essential (primary) hypertension: Secondary | ICD-10-CM

## 2021-04-14 MED ORDER — LOSARTAN POTASSIUM 50 MG PO TABS: 50.0000 mg | ORAL_TABLET | Freq: Two times a day (BID) | ORAL | 0 refills | Status: DC

## 2021-05-02 ENCOUNTER — Other Ambulatory Visit: Payer: Self-pay | Admitting: Hematology & Oncology

## 2021-05-03 ENCOUNTER — Encounter: Payer: Self-pay | Admitting: Family

## 2021-05-07 ENCOUNTER — Inpatient Hospital Stay: Payer: PPO | Admitting: Hematology & Oncology

## 2021-05-07 ENCOUNTER — Encounter: Payer: Self-pay | Admitting: Hematology & Oncology

## 2021-05-07 ENCOUNTER — Inpatient Hospital Stay: Payer: PPO | Attending: Hematology & Oncology

## 2021-05-07 ENCOUNTER — Other Ambulatory Visit: Payer: Self-pay

## 2021-05-07 DIAGNOSIS — H6123 Impacted cerumen, bilateral: Secondary | ICD-10-CM | POA: Diagnosis not present

## 2021-05-07 DIAGNOSIS — H9011 Conductive hearing loss, unilateral, right ear, with unrestricted hearing on the contralateral side: Secondary | ICD-10-CM | POA: Diagnosis not present

## 2021-05-07 LAB — CBC WITH DIFFERENTIAL (CANCER CENTER ONLY)
Abs Immature Granulocytes: 0.01 10*3/uL (ref 0.00–0.07)
Basophils Absolute: 0 10*3/uL (ref 0.0–0.1)
Basophils Relative: 0 %
Eosinophils Absolute: 0.2 10*3/uL (ref 0.0–0.5)
Eosinophils Relative: 4 %
HCT: 37.7 % — ABNORMAL LOW (ref 39.0–52.0)
Hemoglobin: 12.8 g/dL — ABNORMAL LOW (ref 13.0–17.0)
Immature Granulocytes: 0 %
Lymphocytes Relative: 21 %
Lymphs Abs: 1 10*3/uL (ref 0.7–4.0)
MCH: 33.1 pg (ref 26.0–34.0)
MCHC: 34 g/dL (ref 30.0–36.0)
MCV: 97.4 fL (ref 80.0–100.0)
Monocytes Absolute: 0.4 10*3/uL (ref 0.1–1.0)
Monocytes Relative: 9 %
Neutro Abs: 3 10*3/uL (ref 1.7–7.7)
Neutrophils Relative %: 66 %
Platelet Count: 207 10*3/uL (ref 150–400)
RBC: 3.87 MIL/uL — ABNORMAL LOW (ref 4.22–5.81)
RDW: 12.6 % (ref 11.5–15.5)
WBC Count: 4.5 10*3/uL (ref 4.0–10.5)
nRBC: 0 % (ref 0.0–0.2)

## 2021-05-07 LAB — CMP (CANCER CENTER ONLY)
ALT: 13 U/L (ref 0–44)
AST: 19 U/L (ref 15–41)
Albumin: 4.1 g/dL (ref 3.5–5.0)
Alkaline Phosphatase: 62 U/L (ref 38–126)
Anion gap: 7 (ref 5–15)
BUN: 16 mg/dL (ref 8–23)
CO2: 28 mmol/L (ref 22–32)
Calcium: 10.2 mg/dL (ref 8.9–10.3)
Chloride: 104 mmol/L (ref 98–111)
Creatinine: 0.89 mg/dL (ref 0.61–1.24)
GFR, Estimated: >60 mL/min (ref >60)
Glucose, Bld: 129 mg/dL — ABNORMAL HIGH (ref 70–99)
Potassium: 4.3 mmol/L (ref 3.5–5.1)
Sodium: 139 mmol/L (ref 135–145)
Total Bilirubin: 0.6 mg/dL (ref 0.3–1.2)
Total Protein: 7.4 g/dL (ref 6.5–8.1)

## 2021-05-07 LAB — IRON AND IRON BINDING CAPACITY (CC-WL,HP ONLY)
Iron: 155 ug/dL (ref 45–182)
Saturation Ratios: 60 % — ABNORMAL HIGH (ref 17.9–39.5)
TIBC: 260 ug/dL (ref 250–450)
UIBC: 105 ug/dL — ABNORMAL LOW (ref 117–376)

## 2021-05-07 LAB — FERRITIN: Ferritin: 27 ng/mL (ref 24–336)

## 2021-05-07 NOTE — Progress Notes (Signed)
?Hematology and Oncology Follow Up Visit ? ?Preston Reeves ?[id number] ?1940-10-03 81 y.o. ?05/07/2021 ? ? ?Principle Diagnosis:  ?Hemochromatosis ? ?Current Therapy:   ?Phlebotomy to maintain ferritin less than 100 ?    ?Interim History:  Preston Reeves is back for follow-up.  We see him back yearly.  He has been quite busy the past year.  Has a sister who has dementia.  He has been trying to help her out. ? ?He and his wife had their 60th wedding anniversary in February.  The whole family was down in [place].  They had a wonderful time. ? ?He is doing okay healthwise.  We saw him a year ago, his ferritin was 56 with an iron saturation of 103%.  I think a lot of the iron saturation was up because of iron avidity. ? ?I told him that he can still donate blood if he would like. ? ?He has had no problems with his heart.  Is been no problems with his lungs.  He has had no problems with his liver or kidneys. ? ?There is no change in bowel or bladder habits. ? ?Overall, his performance status is ECOG 1.  ? ?Medications:  ?Current Outpatient Medications:  ?  albuterol (VENTOLIN HFA) 108 (90 Base) MCG/ACT inhaler, Inhale 1-2 puffs into the lungs every 6 (six) hours as needed for wheezing or shortness of breath., Disp: 18 g, Rfl: 8 ?  Ascorbic Acid (VITAMIN C) 1000 MG tablet, Take 1,000 mg by mouth daily., Disp: , Rfl:  ?  aspirin 81 MG tablet, Take 81 mg by mouth daily., Disp: , Rfl:  ?  Calcium Carbonate-Vitamin D (CALCIUM + D PO), Take 1 tablet by mouth daily. , Disp: , Rfl:  ?  Cholecalciferol (VITAMIN D) 2000 UNITS CAPS, Take 2,000 Units by mouth daily., Disp: , Rfl:  ?  Docusate Sodium (COLACE PO), Take 200 mg by mouth in the morning and at bedtime., Disp: , Rfl:  ?  fexofenadine (ALLEGRA) 180 MG tablet, Take 180 mg by mouth daily., Disp: , Rfl:  ?  FIBER PO, Take by mouth in the morning and at bedtime., Disp: , Rfl:  ?  fluticasone (FLONASE) 50 MCG/ACT nasal spray, Place 2 sprays into both nostrils daily., Disp: 16 g, Rfl:  6 ?  Glucosamine-Chondroit-Vit C-Mn (GLUCOSAMINE 1500 COMPLEX PO), Take by mouth in the morning and at bedtime., Disp: , Rfl:  ?  losartan (COZAAR) 50 MG tablet, Take 1 tablet (50 mg total) by mouth 2 (two) times daily., Disp: 180 tablet, Rfl: 0 ?  metoprolol succinate (TOPROL-XL) 50 MG 24 hr tablet, TAKE 1 TABLET(50 MG) BY MOUTH DAILY WITH OR IMMEDIATELY FOLLOWING A MEAL, Disp: 90 tablet, Rfl: 0 ?  Multiple Vitamins-Minerals (MULTIVITAMIN PO), Take 1 tablet by mouth daily. , Disp: , Rfl:  ?  Omega-3 Fatty Acids (FISH OIL PO), Take 1,000 mg by mouth in the morning and at bedtime., Disp: , Rfl:  ?  Red Yeast Rice 600 MG CAPS, Take 1,200 mg by mouth daily. , Disp: , Rfl:  ?  SALINE NASAL SPRAY NA, Place 1 spray into the nose daily as needed (allergies). , Disp: , Rfl:  ?  SYNTHROID 150 MCG tablet, TAKE 1 TABLET(150 MCG) BY MOUTH DAILY, Disp: 90 tablet, Rfl: 1 ?  umeclidinium-vilanterol (ANORO ELLIPTA) 62.5-25 MCG/ACT AEPB, Inhale 1 puff into the lungs daily at 6 (six) AM., Disp: 1 each, Rfl: 11 ?  vitamin B-12 (CYANOCOBALAMIN) 1000 MCG tablet, Take 1,000 mcg by mouth daily.,  Disp: , Rfl:  ? ?Allergies: No Known Allergies ? ?Past Medical History, Surgical history, Social history, and Family History were reviewed and updated. ? ?Review of Systems: ?Review of Systems  ?Constitutional: Negative.   ?HENT: Negative.    ?Eyes: Negative.   ?Respiratory: Negative.    ?Cardiovascular: Negative.   ?Gastrointestinal: Negative.   ?Genitourinary: Negative.   ?Musculoskeletal: Negative.   ?Skin: Negative.   ?Neurological: Negative.   ?Endo/Heme/Allergies: Negative.   ?Psychiatric/Behavioral: Negative.    ? ? ?Physical Exam: ? height is '5\' 10"'$  (1.778 m) and weight is 178 lb 12.8 oz (81.1 kg). His oral temperature is 98 ?F (36.7 ?C). His blood pressure is 123/78 and his pulse is 82. His respiration is 18 and oxygen saturation is 100%.  ? ?Wt Readings from Last 3 Encounters:  ?05/07/21 178 lb 12.8 oz (81.1 kg)  ?12/13/20 177 lb 9.6 oz  (80.6 kg)  ?12/13/20 177 lb 9.6 oz (80.6 kg)  ? ? ? ?Physical Exam ?Vitals reviewed.  ?HENT:  ?   Head: Normocephalic and atraumatic.  ?Eyes:  ?   Pupils: Pupils are equal, round, and reactive to light.  ?Cardiovascular:  ?   Rate and Rhythm: Normal rate and regular rhythm.  ?   Heart sounds: Normal heart sounds.  ?Pulmonary:  ?   Effort: Pulmonary effort is normal.  ?   Breath sounds: Normal breath sounds.  ?Abdominal:  ?   General: Bowel sounds are normal.  ?   Palpations: Abdomen is soft.  ?Musculoskeletal:     ?   General: No tenderness or deformity. Normal range of motion.  ?   Cervical back: Normal range of motion.  ?Lymphadenopathy:  ?   Cervical: No cervical adenopathy.  ?Skin: ?   General: Skin is warm and dry.  ?   Findings: No erythema or rash.  ?Neurological:  ?   Mental Status: He is alert and oriented to person, place, and time.  ?Psychiatric:     ?   Behavior: Behavior normal.     ?   Thought Content: Thought content normal.     ?   Judgment: Judgment normal.  ? ? ? ?Lab Results  ?Component Value Date  ? WBC 4.5 05/07/2021  ? HGB 12.8 (L) 05/07/2021  ? HCT 37.7 (L) 05/07/2021  ? MCV 97.4 05/07/2021  ? PLT 207 05/07/2021  ? ?  Chemistry   ?   ?Component Value Date/Time  ? NA 139 05/07/2021 1022  ? NA 141 03/11/2018 0842  ? NA 138 12/30/2015 1307  ? K 4.3 05/07/2021 1022  ? K 4.1 12/30/2015 1307  ? CL 104 05/07/2021 1022  ? CO2 28 05/07/2021 1022  ? CO2 24 12/30/2015 1307  ? BUN 16 05/07/2021 1022  ? BUN 15 03/11/2018 0842  ? BUN 17.4 12/30/2015 1307  ? CREATININE 0.89 05/07/2021 1022  ? CREATININE 0.9 12/30/2015 1307  ?    ?Component Value Date/Time  ? CALCIUM 10.2 05/07/2021 1022  ? CALCIUM 9.6 12/30/2015 1307  ? ALKPHOS 62 05/07/2021 1022  ? ALKPHOS 69 12/30/2015 1307  ? AST 19 05/07/2021 1022  ? AST 23 12/30/2015 1307  ? ALT 13 05/07/2021 1022  ? ALT 15 12/30/2015 1307  ? BILITOT 0.6 05/07/2021 1022  ? BILITOT 0.75 12/30/2015 1307  ?  ? ? ?Impression and Plan: ?Preston Reeves is a 81 year old gentleman  with hemochromatosis.  We will see what his iron studies look like.  Again, told that he could certainly donate blood.  I do not think this would be a problem from my point of view. ? ?We will still plan to get him back yearly.  I am just glad that he is doing so well that he is still so functional. ?I am also glad that he can help out his sister. ? ? ?[name], MD ?4/12/202311:38 AM ?

## 2021-05-09 DIAGNOSIS — R972 Elevated prostate specific antigen [PSA]: Secondary | ICD-10-CM | POA: Diagnosis not present

## 2021-05-13 ENCOUNTER — Telehealth: Payer: PPO

## 2021-05-16 DIAGNOSIS — R972 Elevated prostate specific antigen [PSA]: Secondary | ICD-10-CM | POA: Diagnosis not present

## 2021-05-16 NOTE — Progress Notes (Deleted)
?Cardiology Office Note:   ? ?Date:  05/16/2021  ? ?ID:  Preston Reeves, DOB 28-Jul-1940, MRN [id number] ? ?PCP:  Binnie Rail, MD ?  ?[place]  ?Cardiologist:  Ena Dawley, MD  ?Advanced Practice Provider:  No care team member to display ?Electrophysiologist:  None  ? ? ?Referring MD: Binnie Rail, MD  ? ? ?History of Present Illness:   ? ?Preston Reeves is a 81 y.o. male with a hx of HTN, MVP, HLD, thyrotoxicosis, hemochromatosis, hypothyroidism S/P RAI who was previously followed by Dr. Meda Coffee now returning to clinic for follow-up. ? ?Last seen in clinic on 04/19/20 where he was doing well from a CV standpoint. TTE 05/17/20 with LVEF 60-65%, G1DD, mild LVH, trivial MR ? ?Today, ***  ? ?Past Medical History:  ?Diagnosis Date  ? Allergy   ? SEASONAL  ? Asthma   ? Cataract   ? Colon polyps   ? Adenomatous  ? COPD (chronic obstructive pulmonary disease) ([place])   ? Diverticulitis of colon with perforation 2010  ? Colectomy/ostomy  ? Diverticulosis   ? hx of  ? Hemochromatosis   ? hx of  ? History of BPH   ? History of COPD   ? Hyperlipidemia   ? Hypothyroidism   ? Incisional hernia   ? MVP (mitral valve prolapse)   ? Thyroid disease   ? ? ?Past Surgical History:  ?Procedure Laterality Date  ? COLONOSCOPY  last 09/25/2013  ? COLONOSCOPY W/ BIOPSIES  2011  ? Dr Henrene Pastor  ? COLOSTOMY TAKEDOWN  2010  ? Dr Barry Dienes  ? INGUINAL HERNIA REPAIR Left 1999  ? Dr. Rebekah Chesterfield  ? INSERTION OF MESH N/A 10/13/2013  ? Procedure: INSERTION OF MESH;  Surgeon: Michael Boston, MD;  Location: [facility];  Service: General;  Laterality: N/A;  ? LAPAROSCOPIC LYSIS OF ADHESIONS N/A 10/13/2013  ? Procedure: LAPAROSCOPIC LYSIS OF ADHESIONS;  Surgeon: Michael Boston, MD;  Location: [facility];  Service: General;  Laterality: N/A;  ? PARTIAL COLECTOMY  08/12/2008  ? Hartmann; perforated colon from diverticulitis  ? UPPER GI ENDOSCOPY    ? VENTRAL HERNIA REPAIR N/A 10/13/2013  ? Procedure: REPAIR OF VENTRAL WALL HERNIA WITH UNILATERAL  COMPARTMENT SEPARATION;  Surgeon: Michael Boston, MD;  Location: [facility];  Service: General;  Laterality: N/A;  ? ? ?Current Medications: ?No outpatient medications have been marked as taking for the 05/20/21 encounter (Appointment) with Freada Bergeron, MD.  ?  ? ?Allergies:   Patient has no known allergies.  ? ?Social History  ? ?Socioeconomic History  ? Marital status: Married  ?  Spouse name: Not on file  ? Number of children: Not on file  ? Years of education: Not on file  ? Highest education level: Not on file  ?Occupational History  ? Occupation: Retired  ?  Employer: RETIRED  ?Tobacco Use  ? Smoking status: Former  ?  Types: Cigarettes  ?  Quit date: 2005  ?  Years since quitting: 18.3  ? Smokeless tobacco: Never  ?Vaping Use  ? Vaping Use: Never used  ?Substance and Sexual Activity  ? Alcohol use: Yes  ?  Alcohol/week: 7.0 standard drinks  ?  Types: 7 Cans of beer per week  ?  Comment: drinks beer weekly  ? Drug use: No  ? Sexual activity: Yes  ?Other Topics Concern  ? Not on file  ?Social History Narrative  ? Married. Education: [employer].  ? ?Social Determinants of Health  ? ?  Financial Resource Strain: Low Risk   ? Difficulty of Paying Living Expenses: Not hard at all  ?Food Insecurity: No Food Insecurity  ? Worried About Charity fundraiser in the Last Year: Never true  ? Ran Out of Food in the Last Year: Never true  ?Transportation Needs: No Transportation Needs  ? Lack of Transportation (Medical): No  ? Lack of Transportation (Non-Medical): No  ?Physical Activity: Sufficiently Active  ? Days of Exercise per Week: 5 days  ? Minutes of Exercise per Session: 30 min  ?Stress: No Stress Concern Present  ? Feeling of Stress : Not at all  ?Social Connections: Socially Integrated  ? Frequency of Communication with Friends and Family: More than three times a week  ? Frequency of Social Gatherings with Friends and Family: More than three times a week  ? Attends Religious Services: More than 4 times per year  ?  Active Member of Clubs or Organizations: Yes  ? Attends Archivist Meetings: More than 4 times per year  ? Marital Status: Married  ?  ? ?Family History: ?The patient's family history includes Breast cancer in his mother; Colon cancer in his father; Diabetes in his father; Heart attack in his father; Heart disease in his father. There is no history of Stroke, Esophageal cancer, Rectal cancer, Stomach cancer, or Colon polyps. ? ?ROS:   ?Please see the history of present illness.    ?Review of Systems  ?Constitutional:  Negative for chills and fever.  ?HENT:  Negative for hearing loss.   ?Eyes:  Negative for blurred vision and redness.  ?Respiratory:  Negative for shortness of breath.   ?Cardiovascular:  Negative for chest pain, palpitations, orthopnea, claudication, leg swelling and PND.  ?Gastrointestinal:  Negative for heartburn, melena, nausea and vomiting.  ?Genitourinary:  Negative for dysuria and flank pain.  ?Musculoskeletal:  Negative for falls.  ?Neurological:  Negative for dizziness and loss of consciousness.  ?Endo/Heme/Allergies:  Negative for polydipsia.  ?Psychiatric/Behavioral:  Negative for substance abuse.   ? ?EKGs/Labs/Other Studies Reviewed:   ? ?The following studies were reviewed today: ?TTE 05/17/2020: ?IMPRESSIONS  ? 1. Left ventricular ejection fraction, by estimation, is 60 to 65%. The  ?left ventricle has normal function. The left ventricle has no regional  ?wall motion abnormalities. There is mild concentric left ventricular  ?hypertrophy. Left ventricular diastolic  ?parameters are consistent with Grade I diastolic dysfunction (impaired  ?relaxation).  ? 2. The aortic valve is calcified. Aortic valve regurgitation is not  ?visualized. Aortic valve sclerosis/calcification is present, without any  ?Doppler evidence of aortic stenosis.  ? 3. Right ventricular systolic function is normal. The right ventricular  ?size is normal.  ? 4. The mitral valve is normal in structure. Trivial  mitral valve  ?regurgitation. No evidence of mitral stenosis.  ? 5. The inferior vena cava is normal in size with greater than 50%  ?respiratory variability, suggesting right atrial pressure of 3 mmHg.  ? ?Comparison(s): A prior study was performed on 01/09/2016. Calcifed  ?moderator band appears further thickened from prior study.  ?TTE 01/09/16: ?Study Conclusions  ?- Left ventricle: The cavity size was normal. Systolic function was  ?  vigorous. The estimated ejection fraction was in the range of 65%  ?  to 70%. Wall motion was normal; there were no regional wall  ?  motion abnormalities. Doppler parameters are consistent with  ?  abnormal left ventricular relaxation (grade 1 diastolic  ?  dysfunction). There was no evidence of  elevated ventricular  ?  filling pressure by Doppler parameters.  ?- Aortic valve: Valve mobility was restricted. There was mild  ?  stenosis. There was no regurgitation.  ?- Aortic root: The aortic root was normal in size.  ?- Ascending aorta: The ascending aorta was normal in size.  ?- Mitral valve: There was trivial regurgitation.  ?- Left atrium: The atrium was normal in size.  ?- Right ventricle: Systolic function was normal.  ?- Right atrium: The atrium was normal in size.  ?- Tricuspid valve: There was trivial regurgitation.  ?- Pulmonary arteries: Systolic pressure was at the upper limits of  ?  normal. PA peak pressure: 31 mm Hg (S).  ?- Inferior vena cava: The vessel was normal in size.  ?- Pericardium, extracardiac: There was no pericardial effusion.  ? ?EKG:  EKG is  ordered today.  The ekg ordered today demonstrates NSR with LBBB (chronic) with HR 70 ? ?Recent Labs: ?12/13/2020: TSH 0.82 ?05/07/2021: ALT 13; BUN 16; Creatinine 0.89; Hemoglobin 12.8; Platelet Count 207; Potassium 4.3; Sodium 139  ?Recent Lipid Panel ?   ?Component Value Date/Time  ? CHOL 143 12/13/2020 1040  ? CHOL 155 09/18/2016 0826  ? TRIG 72.0 12/13/2020 1040  ? HDL 43.60 12/13/2020 1040  ? HDL 41 09/18/2016  0826  ? CHOLHDL 3 12/13/2020 1040  ? VLDL 14.4 12/13/2020 1040  ? [place] 85 12/13/2020 1040  ? [place] 100 (H) 09/18/2016 1157  ? ? ? ?Physical Exam:   ? ?VS:  There were no vitals taken for this visit.   ? ?Wt

## 2021-05-20 ENCOUNTER — Encounter: Payer: Self-pay | Admitting: Cardiology

## 2021-05-20 ENCOUNTER — Ambulatory Visit: Payer: PPO | Admitting: Cardiology

## 2021-05-20 VITALS — BP 136/72 | HR 68 | Ht 70.0 in | Wt 180.6 lb

## 2021-05-20 DIAGNOSIS — I10 Essential (primary) hypertension: Secondary | ICD-10-CM | POA: Diagnosis not present

## 2021-05-20 DIAGNOSIS — E785 Hyperlipidemia, unspecified: Secondary | ICD-10-CM

## 2021-05-20 DIAGNOSIS — I447 Left bundle-branch block, unspecified: Secondary | ICD-10-CM | POA: Diagnosis not present

## 2021-05-20 NOTE — Progress Notes (Signed)
?Cardiology Office Note:   ? ?Date:  05/20/2021  ? ?ID:  Preston Reeves, DOB 11-26-1940, MRN [id number] ? ?PCP:  Binnie Rail, MD ?  ?[place]  ?Cardiologist:  Ena Dawley, MD  ?Advanced Practice Provider:  No care team member to display ?Electrophysiologist:  None  ? ? ?Referring MD: Binnie Rail, MD  ? ? ?History of Present Illness:   ? ?Preston Reeves is a 81 y.o. male with a hx of HTN, MVP, HLD, thyrotoxicosis, hemochromatosis, hypothyroidism S/P RAI who was previously followed by Dr. Meda Coffee now returning to clinic for follow-up. ? ?Last seen in clinic on 04/19/20 where he was doing well from a CV standpoint. TTE 05/17/20 with LVEF 60-65%, G1DD, mild LVH, trivial MR ? ?Today, he is doing well. The last time he had a phlebotomy was about 3 weeks ago. He denies chest pain, chest pressure, dyspnea at rest or with exertion, palpitations, PND, orthopnea, or leg swelling. ? ?His blood pressure at home typically ranges within 409W systolic. He is tolerating red yeast rice. Recent echo did not show mitral valve prolapse. ? ?Past Medical History:  ?Diagnosis Date  ? Allergy   ? SEASONAL  ? Asthma   ? Cataract   ? Colon polyps   ? Adenomatous  ? COPD (chronic obstructive pulmonary disease) ([place])   ? Diverticulitis of colon with perforation 2010  ? Colectomy/ostomy  ? Diverticulosis   ? hx of  ? Hemochromatosis   ? hx of  ? History of BPH   ? History of COPD   ? Hyperlipidemia   ? Hypothyroidism   ? Incisional hernia   ? MVP (mitral valve prolapse)   ? Thyroid disease   ? ? ?Past Surgical History:  ?Procedure Laterality Date  ? COLONOSCOPY  last 09/25/2013  ? COLONOSCOPY W/ BIOPSIES  2011  ? Dr Henrene Pastor  ? COLOSTOMY TAKEDOWN  2010  ? Dr Barry Dienes  ? INGUINAL HERNIA REPAIR Left 1999  ? Dr. Rebekah Chesterfield  ? INSERTION OF MESH N/A 10/13/2013  ? Procedure: INSERTION OF MESH;  Surgeon: Michael Boston, MD;  Location: [facility];  Service: General;  Laterality: N/A;  ? LAPAROSCOPIC LYSIS OF ADHESIONS N/A 10/13/2013  ?  Procedure: LAPAROSCOPIC LYSIS OF ADHESIONS;  Surgeon: Michael Boston, MD;  Location: [facility];  Service: General;  Laterality: N/A;  ? PARTIAL COLECTOMY  08/12/2008  ? [facility]mann; perforated colon from diverticulitis  ? UPPER GI ENDOSCOPY    ? VENTRAL HERNIA REPAIR N/A 10/13/2013  ? Procedure: REPAIR OF VENTRAL WALL HERNIA WITH UNILATERAL COMPARTMENT SEPARATION;  Surgeon: Michael Boston, MD;  Location: [facility];  Service: General;  Laterality: N/A;  ? ? ?Current Medications: ?No outpatient medications have been marked as taking for the 05/20/21 encounter (Office Visit) with Freada Bergeron, MD.  ?  ? ?Allergies:   Patient has no known allergies.  ? ?Social History  ? ?Socioeconomic History  ? Marital status: Married  ?  Spouse name: Not on file  ? Number of children: Not on file  ? Years of education: Not on file  ? Highest education level: Not on file  ?Occupational History  ? Occupation: Retired  ?  Employer: RETIRED  ?Tobacco Use  ? Smoking status: Former  ?  Types: Cigarettes  ?  Quit date: 2005  ?  Years since quitting: 18.3  ? Smokeless tobacco: Never  ?Vaping Use  ? Vaping Use: Never used  ?Substance and Sexual Activity  ? Alcohol use: Yes  ?  Alcohol/week: 7.0 standard drinks  ?  Types: 7 Cans of beer per week  ?  Comment: drinks beer weekly  ? Drug use: No  ? Sexual activity: Yes  ?Other Topics Concern  ? Not on file  ?Social History Narrative  ? Married. Education: [employer].  ? ?Social Determinants of Health  ? ?Financial Resource Strain: Low Risk   ? Difficulty of Paying Living Expenses: Not hard at all  ?Food Insecurity: No Food Insecurity  ? Worried About Charity fundraiser in the Last Year: Never true  ? Ran Out of Food in the Last Year: Never true  ?Transportation Needs: No Transportation Needs  ? Lack of Transportation (Medical): No  ? Lack of Transportation (Non-Medical): No  ?Physical Activity: Sufficiently Active  ? Days of Exercise per Week: 5 days  ? Minutes of Exercise per Session: 30 min  ?Stress: No  Stress Concern Present  ? Feeling of Stress : Not at all  ?Social Connections: Socially Integrated  ? Frequency of Communication with Friends and Family: More than three times a week  ? Frequency of Social Gatherings with Friends and Family: More than three times a week  ? Attends Religious Services: More than 4 times per year  ? Active Member of Clubs or Organizations: Yes  ? Attends Archivist Meetings: More than 4 times per year  ? Marital Status: Married  ?  ? ?Family History: ?The patient's family history includes Breast cancer in his mother; Colon cancer in his father; Diabetes in his father; Heart attack in his father; Heart disease in his father. There is no history of Stroke, Esophageal cancer, Rectal cancer, Stomach cancer, or Colon polyps. ? ?ROS:   ?Please see the history of present illness.    ?Review of Systems  ?Constitutional:  Negative for chills and fever.  ?HENT:  Negative for hearing loss.   ?Eyes:  Negative for blurred vision and redness.  ?Respiratory:  Negative for shortness of breath.   ?Cardiovascular:  Negative for chest pain, palpitations, orthopnea, claudication, leg swelling and PND.  ?Gastrointestinal:  Negative for heartburn, melena, nausea and vomiting.  ?Genitourinary:  Negative for dysuria and flank pain.  ?Musculoskeletal:  Negative for falls.  ?Neurological:  Negative for dizziness and loss of consciousness.  ?Endo/Heme/Allergies:  Negative for polydipsia.  ?Psychiatric/Behavioral:  Negative for substance abuse.   ? ?EKGs/Labs/Other Studies Reviewed:   ? ?The following studies were reviewed today: ?TTE 05/17/2020: ?IMPRESSIONS  ? 1. Left ventricular ejection fraction, by estimation, is 60 to 65%. The  ?left ventricle has normal function. The left ventricle has no regional  ?wall motion abnormalities. There is mild concentric left ventricular  ?hypertrophy. Left ventricular diastolic  ?parameters are consistent with Grade I diastolic dysfunction (impaired  ?relaxation).   ? 2. The aortic valve is calcified. Aortic valve regurgitation is not  ?visualized. Aortic valve sclerosis/calcification is present, without any  ?Doppler evidence of aortic stenosis.  ? 3. Right ventricular systolic function is normal. The right ventricular  ?size is normal.  ? 4. The mitral valve is normal in structure. Trivial mitral valve  ?regurgitation. No evidence of mitral stenosis.  ? 5. The inferior vena cava is normal in size with greater than 50%  ?respiratory variability, suggesting right atrial pressure of 3 mmHg.  ? ?Comparison(s): A prior study was performed on 01/09/2016. Calcifed  ?moderator band appears further thickened from prior study.  ? ?TTE 01/09/16: ?Study Conclusions  ?- Left ventricle: The cavity size was normal. Systolic function  was  ?  vigorous. The estimated ejection fraction was in the range of 65%  ?  to 70%. Wall motion was normal; there were no regional wall  ?  motion abnormalities. Doppler parameters are consistent with  ?  abnormal left ventricular relaxation (grade 1 diastolic  ?  dysfunction). There was no evidence of elevated ventricular  ?  filling pressure by Doppler parameters.  ?- Aortic valve: Valve mobility was restricted. There was mild  ?  stenosis. There was no regurgitation.  ?- Aortic root: The aortic root was normal in size.  ?- Ascending aorta: The ascending aorta was normal in size.  ?- Mitral valve: There was trivial regurgitation.  ?- Left atrium: The atrium was normal in size.  ?- Right ventricle: Systolic function was normal.  ?- Right atrium: The atrium was normal in size.  ?- Tricuspid valve: There was trivial regurgitation.  ?- Pulmonary arteries: Systolic pressure was at the upper limits of  ?  normal. PA peak pressure: 31 mm Hg (S).  ?- Inferior vena cava: The vessel was normal in size.  ?- Pericardium, extracardiac: There was no pericardial effusion.  ? ?EKG:   ?05/20/21: Sinus rhythm, rate 68 bpm; LBBB ?04/19/20: NSR with LBBB (chronic) with HR  70 ? ?Recent Labs: ?12/13/2020: TSH 0.82 ?05/07/2021: ALT 13; BUN 16; Creatinine 0.89; Hemoglobin 12.8; Platelet Count 207; Potassium 4.3; Sodium 139  ?Recent Lipid Panel ?   ?Component Value Date/Time  ? CHOL 143

## 2021-05-20 NOTE — Patient Instructions (Signed)
Medication Instructions:  ? ?Your physician recommends that you continue on your current medications as directed. Please refer to the Current Medication list given to you today. ? ?*If you need a refill on your cardiac medications before your next appointment, please call your pharmacy* ? ? ?Testing/Procedures: ? ?Your physician has requested that you have an echocardiogram. Echocardiography is a painless test that uses sound waves to create images of your heart. It provides your doctor with information about the size and shape of your heart and how well your heart?s chambers and valves are working. This procedure takes approximately one hour. There are no restrictions for this procedure.  SCHEDULE TO BE DONE IN ONE YEAR PER DR. Johney Frame ? ? ?Follow-Up: ?At [facility], you and your health needs are our priority.  As part of our continuing mission to provide you with exceptional heart care, we have created designated Provider Care Teams.  These Care Teams include your primary Cardiologist (physician) and Advanced Practice Providers (APPs -  Physician Assistants and Nurse Practitioners) who all work together to provide you with the care you need, when you need it. ? ?We recommend signing up for the patient portal called "MyChart".  Sign up information is provided on this After Visit Summary.  MyChart is used to connect with patients for Virtual Visits (Telemedicine).  Patients are able to view lab/test results, encounter notes, upcoming appointments, etc.  Non-urgent messages can be sent to your provider as well.   ?To learn more about what you can do with MyChart, go to [email or web address].   ? ?Your next appointment:   ?1 year(s) ? ?The format for your next appointment:   ?In Person ? ?Provider:   ?DR. PEMBERTON ? ?Important Information About [place] ? ? ? ? ? ? ?

## 2021-05-21 ENCOUNTER — Telehealth: Payer: PPO

## 2021-05-28 ENCOUNTER — Ambulatory Visit (INDEPENDENT_AMBULATORY_CARE_PROVIDER_SITE_OTHER): Payer: PPO

## 2021-05-28 DIAGNOSIS — I10 Essential (primary) hypertension: Secondary | ICD-10-CM

## 2021-05-28 DIAGNOSIS — J449 Chronic obstructive pulmonary disease, unspecified: Secondary | ICD-10-CM

## 2021-05-28 NOTE — Patient Instructions (Signed)
Visit Information ? ?Following are the goals we discussed today:  ? ?Manage My Medicine  ? ?Timeframe:  Long-Range Goal ?Priority:  Medium ?Start Date:               04/15/20              ?Expected End Date:     05/29/2022          ? ?Follow Up Date 05/2022 ?  ?- call for medicine refill 2 or 3 days before it runs out ?- call if I am sick and can't take my medicine ?- keep a list of all the medicines I take; vitamins and herbals too ?- use a pillbox to sort medicine  ?  ?Why is this important?   ?These steps will help you keep on track with your medicines. ? ?Plan: Telephone follow up appointment with care management team member scheduled for:  12 months ?The patient has been provided with contact information for the care management team and has been advised to call with any health related questions or concerns.  ? ?Tomasa Blase, PharmD ?Clinical Pharmacist, [place]  ? ?Please call the care guide team at [phone] if you need to cancel or reschedule your appointment.  ? ?Patient verbalizes understanding of instructions and care plan provided today and agrees to view in [place]. Active MyChart status confirmed with patient.   ? ?

## 2021-05-28 NOTE — Progress Notes (Signed)
? ?Chronic Care Management ?Pharmacy Note ? ?05/28/2021 ?Name:  Preston Reeves MRN:  [id number] DOB:  [birth year] ? ?Summary: ?-Patient reports that since he has started anoro breathing has been controlled - now only using albuterol inhaler maybe once weekly  ?-Reports BP has been well controlled averaging 126-128/60's ?-endorses compliance with current medications - denies any issues with obtaining his medications ? ?Recommendations/Changes made from today's visit: ?-Recommending no changes to medications at this time, patient to continue to monitor BP at home - to reach out should BP average >140/90 / pt to reach out should he find any issues with breathing / increase in albuterol use  ?-counseled to receive shingrix vaccine from pharmacy  ? ?Plan: ?-f/u in 1 year  ? ? ?Subjective: ?Preston Reeves is an 81 y.o. year old male who is a primary patient of Burns, Claudina Lick, MD.  The CCM team was consulted for assistance with disease management and care coordination needs.   ? ?Engaged with patient by telephone for follow up visit in response to provider referral for pharmacy case management and/or care coordination services.  ? ?Consent to Services:  ?The patient was given the following information about Chronic Care Management services today, agreed to services, and gave verbal consent: 1. CCM service includes personalized support from designated clinical staff supervised by the primary care provider, including individualized plan of care and coordination with other care providers 2. 24/7 contact phone numbers for assistance for urgent and routine care needs. 3. Service will only be billed when office clinical staff spend 20 minutes or more in a month to coordinate care. 4. Only one practitioner may furnish and bill the service in a calendar month. 5.The patient may stop CCM services at any time (effective at the end of the month) by phone call to the office staff. 6. The patient will be responsible for cost sharing  (co-pay) of up to 20% of the service fee (after annual deductible is met). Patient agreed to services and consent obtained. ? ?Patient Care Team: ?Binnie Rail, MD as PCP - General (Internal Medicine) ?Dorothy Spark, MD as PCP - Cardiology (Cardiology) ?Raynelle Bring, MD as Consulting Physician (Urology) ?Irene Shipper, MD as Consulting Physician (Gastroenterology) ?Darlin Coco, MD as Consulting Physician (Cardiology) ?Tomasa Blase, [facility] (Pharmacist) ? ?Recent office visits: ?12/13/2020 - Dr. Quay Burow - anoro inhaler rx'd - f/u in 1 year   ? ?Recent consult visits: ?05/20/2021 - Dr. Johney Frame - Cardiology - no changes to medications - f/u in 1 year  ?05/07/2021 - Dr. Marin Olp - Oncology - no changes to medications - f/u in 1 year  ? ?Hospital visits: ?None in previous 6 months ? ?Objective: ? ?Lab Results  ?Component Value Date  ? CREATININE 0.89 05/07/2021  ? BUN 16 05/07/2021  ? GFR 80.50 12/13/2020  ? GFRNONAA >60 05/07/2021  ? GFRAA >60 05/10/2019  ? NA 139 05/07/2021  ? K 4.3 05/07/2021  ? CALCIUM 10.2 05/07/2021  ? CO2 28 05/07/2021  ? GLUCOSE 129 (H) 05/07/2021  ? ? ?Lab Results  ?Component Value Date/Time  ? HGBA1C 5.9 12/13/2020 10:40 AM  ? HGBA1C 5.5 12/13/2019 11:00 AM  ? GFR 80.50 12/13/2020 10:40 AM  ? GFR 82.48 12/13/2019 11:00 AM  ?  ?Last diabetic Eye exam: No results found for: HMDIABEYEEXA  ?Last diabetic Foot exam: No results found for: HMDIABFOOTEX  ? ?Lab Results  ?Component Value Date  ? CHOL 143 12/13/2020  ? HDL 43.60 12/13/2020  ? [place]  85 12/13/2020  ? TRIG 72.0 12/13/2020  ? CHOLHDL 3 12/13/2020  ? ? ? ?  Latest Ref Rng & Units 05/07/2021  ? 10:22 AM 12/13/2020  ? 10:40 AM 05/08/2020  ? 10:08 AM  ?Hepatic Function  ?Total Protein 6.5 - 8.1 g/dL 7.4   7.4   7.4    ?Albumin 3.5 - 5.0 g/dL 4.1   4.1   4.3    ?AST 15 - 41 U/L _0 ?ALT 0 - 44 U/L _1 ?Alk Phosphatase 38 - 126 U/L 62   64   63    ?Total Bilirubin 0.3 - 1.2 mg/dL 0.6   0.7   0.9    ? ? ?Lab  Results  ?Component Value Date/Time  ? TSH 0.82 12/13/2020 10:40 AM  ? TSH 1.61 12/28/2019 10:30 AM  ? FREET4 1.27 12/28/2019 10:30 AM  ? FREET4 1.44 12/26/2018 09:12 AM  ? ? ? ?  Latest Ref Rng & Units 05/07/2021  ? 10:22 AM 12/13/2020  ? 10:40 AM 05/08/2020  ? 10:08 AM  ?CBC  ?WBC 4.0 - 10.5 K/uL 4.5   3.2   4.2    ?Hemoglobin 13.0 - 17.0 g/dL 12.8   13.1   14.6    ?Hematocrit 39.0 - 52.0 % 37.7   38.9   41.5    ?Platelets 150 - 400 K/uL 207   217.0   230    ? ?No results found for: VD25OH ? ?Clinical ASCVD: No  ?The ASCVD Risk score (Arnett DK, et al., 2019) failed to calculate for the following reasons: ?  The 2019 ASCVD risk score is only valid for ages 56 to 73   ? ? ?  12/13/2020  ? 10:17 AM 12/13/2019  ? 10:26 AM 12/05/2018  ?  7:59 AM  ?Depression screen PHQ 2/9  ?Decreased Interest 0 0 0  ?Down, Depressed, Hopeless 0 0 0  ?PHQ - 2 Score 0 0 0  ?  ? ? ?Social History  ? ?Tobacco Use  ?Smoking Status Former  ? Types: Cigarettes  ? Quit date: 2005  ? Years since quitting: 18.3  ?Smokeless Tobacco Never  ? ?BP Readings from Last 3 Encounters:  ?05/20/21 136/72  ?05/07/21 123/78  ?12/13/20 130/70  ? ?Pulse Readings from Last 3 Encounters:  ?05/20/21 68  ?05/07/21 82  ?12/13/20 75  ? ?Wt Readings from Last 3 Encounters:  ?05/20/21 180 lb 9.6 oz (81.9 kg)  ?05/07/21 178 lb 12.8 oz (81.1 kg)  ?12/13/20 177 lb 9.6 oz (80.6 kg)  ? ?BMI Readings from Last 3 Encounters:  ?05/20/21 25.91 kg/m?  ?05/07/21 25.66 kg/m?  ?12/13/20 25.12 kg/m?  ? ? ?Assessment/Interventions: Review of patient past medical history, allergies, medications, health status, including review of consultants reports, laboratory and other test data, was performed as part of comprehensive evaluation and provision of chronic care management services.  ? ?SDOH:  (Social Determinants of Health) assessments and interventions performed: Yes ? ? ?[place] ? ?No Known Allergies ? ?Medications Reviewed Today   ? ? Reviewed by Tomasa Blase, [facility]  (Pharmacist) on 05/28/21 at [place]  Med List Status: <None>  ? ?Medication Order Taking? Sig Documenting Provider Last Dose Status Informant  ?albuterol (VENTOLIN HFA) 108 (90 Base) MCG/ACT inhaler [id number] Yes Inhale 1-2 puffs into the lungs every 6 (six) hours as needed for wheezing or shortness of breath. Binnie Rail,  MD Taking Active   ?Ascorbic Acid (VITAMIN C) 1000 MG tablet [id number] Yes Take 1,000 mg by mouth daily. [provider] Taking Active Spouse/Significant Other  ?aspirin 81 MG tablet [id number] Yes Take 81 mg by mouth daily. [provider] Taking Active Spouse/Significant Other  ?Calcium Carbonate-Vitamin D (CALCIUM + D PO) [id number] Yes Take 1 tablet by mouth daily.  [provider] Taking Active Spouse/Significant Other  ?Cholecalciferol (VITAMIN D) 2000 UNITS CAPS [id number] Yes Take 2,000 Units by mouth daily. [provider] Taking Active Spouse/Significant Other  ?Docusate Sodium (COLACE PO) [id number] Yes Take 200 mg by mouth in the morning and at bedtime. [provider] Taking Active   ?fexofenadine (ALLEGRA) 180 MG tablet [id number] Yes Take 180 mg by mouth daily. [provider] Taking Active   ?FIBER PO [id number] Yes Take by mouth in the morning and at bedtime. [provider] Taking Active   ?fluticasone (FLONASE) 50 MCG/ACT nasal spray [id number] Yes Place 2 sprays into both nostrils daily. Chevis Pretty, FNP Taking Active   ?Glucosamine-Chondroit-Vit C-Mn (GLUCOSAMINE 1500 COMPLEX PO) [id number] Yes Take by mouth in the morning and at bedtime. [provider] Taking Active   ?losartan (COZAAR) 50 MG tablet [id number] Yes Take 1 tablet (50 mg total) by mouth 2 (two) times daily. Freada Bergeron, MD Taking Active   ?metoprolol succinate (TOPROL-XL) 50 MG 24 hr tablet [id number] Yes TAKE 1 TABLET(50 MG) BY MOUTH DAILY WITH OR IMMEDIATELY FOLLOWING A MEAL Ennever, Rudell Cobb, MD Taking Active   ?Multiple  Vitamins-Minerals (MULTIVITAMIN PO) [id number] Yes Take 1 tablet by mouth daily.  [provider] Taking Active Spouse/Significant Other  ?Omega-3 Fatty Acids (FISH OIL PO) [id number] Yes Take 1,000 mg by mouth in the morning

## 2021-06-25 DIAGNOSIS — I10 Essential (primary) hypertension: Secondary | ICD-10-CM

## 2021-06-25 DIAGNOSIS — E039 Hypothyroidism, unspecified: Secondary | ICD-10-CM | POA: Diagnosis not present

## 2021-06-25 DIAGNOSIS — Z87891 Personal history of nicotine dependence: Secondary | ICD-10-CM | POA: Diagnosis not present

## 2021-06-25 DIAGNOSIS — J449 Chronic obstructive pulmonary disease, unspecified: Secondary | ICD-10-CM

## 2021-07-21 ENCOUNTER — Other Ambulatory Visit: Payer: Self-pay | Admitting: *Deleted

## 2021-07-21 DIAGNOSIS — I10 Essential (primary) hypertension: Secondary | ICD-10-CM

## 2021-07-21 MED ORDER — LOSARTAN POTASSIUM 50 MG PO TABS: 50.0000 mg | ORAL_TABLET | Freq: Two times a day (BID) | ORAL | 3 refills | Status: DC

## 2021-07-27 ENCOUNTER — Other Ambulatory Visit: Payer: Self-pay | Admitting: Hematology & Oncology

## 2021-07-28 ENCOUNTER — Encounter: Payer: Self-pay | Admitting: Family

## 2021-08-06 ENCOUNTER — Other Ambulatory Visit: Payer: Self-pay | Admitting: Internal Medicine

## 2021-08-06 DIAGNOSIS — E890 Postprocedural hypothyroidism: Secondary | ICD-10-CM

## 2021-08-15 DIAGNOSIS — R972 Elevated prostate specific antigen [PSA]: Secondary | ICD-10-CM | POA: Diagnosis not present

## 2021-08-26 ENCOUNTER — Other Ambulatory Visit: Payer: Self-pay | Admitting: Urology

## 2021-08-26 DIAGNOSIS — R972 Elevated prostate specific antigen [PSA]: Secondary | ICD-10-CM

## 2021-08-27 ENCOUNTER — Other Ambulatory Visit: Payer: Self-pay | Admitting: Urology

## 2021-08-27 DIAGNOSIS — R972 Elevated prostate specific antigen [PSA]: Secondary | ICD-10-CM

## 2021-09-06 ENCOUNTER — Ambulatory Visit
Admission: RE | Admit: 2021-09-06 | Discharge: 2021-09-06 | Disposition: A | Discharge status: Home / Self Care | Payer: PPO | Source: Ambulatory Visit | Attending: Urology | Admitting: Urology

## 2021-09-06 DIAGNOSIS — N4289 Other specified disorders of prostate: Secondary | ICD-10-CM | POA: Diagnosis not present

## 2021-09-06 DIAGNOSIS — R972 Elevated prostate specific antigen [PSA]: Secondary | ICD-10-CM | POA: Diagnosis not present

## 2021-09-06 DIAGNOSIS — R590 Localized enlarged lymph nodes: Secondary | ICD-10-CM | POA: Diagnosis not present

## 2021-09-06 DIAGNOSIS — N419 Inflammatory disease of prostate, unspecified: Secondary | ICD-10-CM | POA: Diagnosis not present

## 2021-09-06 MED ORDER — GADOBENATE DIMEGLUMINE 529 MG/ML IV SOLN
17.0000 mL | Freq: Once | INTRAVENOUS | Status: AC | PRN
  Administered 2021-09-06: 17 mL via INTRAVENOUS

## 2021-10-03 DIAGNOSIS — R972 Elevated prostate specific antigen [PSA]: Secondary | ICD-10-CM | POA: Diagnosis not present

## 2021-10-03 DIAGNOSIS — C61 Malignant neoplasm of prostate: Secondary | ICD-10-CM | POA: Diagnosis not present

## 2021-10-08 ENCOUNTER — Other Ambulatory Visit (HOSPITAL_COMMUNITY): Payer: Self-pay | Admitting: Urology

## 2021-10-08 DIAGNOSIS — C61 Malignant neoplasm of prostate: Secondary | ICD-10-CM

## 2021-10-10 NOTE — Progress Notes (Signed)
I called pt to introduce myself as the Prostate Nurse Navigator and the Coordinator of the Prostate [place].   1. I confirmed with the patient/wife he is aware of his referral to the clinic on 9/26, arriving @ 12:30 pm.    2. I discussed the format of the clinic and the physicians he will be seeing that day.   3. I discussed where the clinic is located and how to contact me.   4. I confirmed his address and informed him I would be mailing a packet of information and forms to be completed. I asked him to bring them with him the day of his appointment.    He voiced understanding of the above. I asked him to call me if he has any questions or concerns regarding his appointments or the forms he needs to complete.

## 2021-10-16 ENCOUNTER — Encounter (HOSPITAL_COMMUNITY)
Admission: RE | Admit: 2021-10-16 | Discharge: 2021-10-16 | Disposition: A | Discharge status: Home / Self Care | Payer: PPO | Source: Ambulatory Visit | Attending: Urology | Admitting: Urology

## 2021-10-16 DIAGNOSIS — C61 Malignant neoplasm of prostate: Secondary | ICD-10-CM | POA: Insufficient documentation

## 2021-10-16 MED ORDER — PIFLIFOLASTAT F 18 (PYLARIFY) INJECTION
9.0000 | Freq: Once | INTRAVENOUS | Status: AC
  Administered 2021-10-16: 9.21 via INTRAVENOUS

## 2021-10-17 NOTE — Progress Notes (Signed)
                               Care Plan Summary  Name: Deja Kaigler DOB: 04-17-1940   Your Medical Team:   Urologist -  Dr. Raynelle Bring, [facility]  Radiation Oncologist - Dr. Tyler Pita, [facility]   Medical Oncologist - Dr. Zola Button, [place]  Recommendations: 1) Radiation       * These recommendations are based on information available as of today's consult.      Recommendations may change depending on the results of further tests or exams.    Next Steps: 1) Consider options for external beam radiation or seed implant (brachytherapy) and contact Kathlee Nations, your nurse navigator, with any questions or treatment decisions.       When appointments need to be scheduled, you will be contacted by [facility] and/or [facility].  Questions?  Please do not hesitate to call Katheren Puller, BSN, RN at [phone] with any questions or concerns.  Kathlee Nations is your Oncology Nurse Navigator and is available to assist you while you're receiving your medical care at [facility].

## 2021-10-21 ENCOUNTER — Other Ambulatory Visit: Payer: Self-pay

## 2021-10-21 ENCOUNTER — Ambulatory Visit: Payer: PPO | Admitting: Orthopaedic Surgery

## 2021-10-21 ENCOUNTER — Inpatient Hospital Stay: Payer: PPO | Attending: Hematology & Oncology | Admitting: Oncology

## 2021-10-21 ENCOUNTER — Ambulatory Visit
Admission: RE | Admit: 2021-10-21 | Discharge: 2021-10-21 | Disposition: A | Discharge status: Home / Self Care | Payer: PPO | Source: Ambulatory Visit | Attending: Radiation Oncology | Admitting: Radiation Oncology

## 2021-10-21 ENCOUNTER — Encounter: Payer: Self-pay | Admitting: Urology

## 2021-10-21 ENCOUNTER — Encounter: Payer: Self-pay | Admitting: Orthopaedic Surgery

## 2021-10-21 VITALS — BP 140/63 | HR 73 | Temp 98.4°F | Resp 18 | Wt 181.2 lb

## 2021-10-21 DIAGNOSIS — Z87891 Personal history of nicotine dependence: Secondary | ICD-10-CM

## 2021-10-21 DIAGNOSIS — Z803 Family history of malignant neoplasm of breast: Secondary | ICD-10-CM

## 2021-10-21 DIAGNOSIS — R200 Anesthesia of skin: Secondary | ICD-10-CM

## 2021-10-21 DIAGNOSIS — Z8546 Personal history of malignant neoplasm of prostate: Secondary | ICD-10-CM | POA: Insufficient documentation

## 2021-10-21 DIAGNOSIS — C61 Malignant neoplasm of prostate: Secondary | ICD-10-CM | POA: Diagnosis not present

## 2021-10-21 DIAGNOSIS — Z79899 Other long term (current) drug therapy: Secondary | ICD-10-CM | POA: Insufficient documentation

## 2021-10-21 DIAGNOSIS — Z800 Family history of malignant neoplasm of digestive organs: Secondary | ICD-10-CM | POA: Diagnosis not present

## 2021-10-21 DIAGNOSIS — G5601 Carpal tunnel syndrome, right upper limb: Secondary | ICD-10-CM | POA: Diagnosis not present

## 2021-10-21 DIAGNOSIS — R202 Paresthesia of skin: Secondary | ICD-10-CM | POA: Diagnosis not present

## 2021-10-21 DIAGNOSIS — Z191 Hormone sensitive malignancy status: Secondary | ICD-10-CM | POA: Diagnosis not present

## 2021-10-21 DIAGNOSIS — R351 Nocturia: Secondary | ICD-10-CM

## 2021-10-21 MED ORDER — LIDOCAINE HCL 1 % IJ SOLN
1.0000 mL | INTRAMUSCULAR | Status: AC | PRN
  Administered 2021-10-21: 1 mL

## 2021-10-21 MED ORDER — METHYLPREDNISOLONE ACETATE 40 MG/ML IJ SUSP
40.0000 mg | INTRAMUSCULAR | Status: AC | PRN
  Administered 2021-10-21: 40 mg

## 2021-10-21 NOTE — Progress Notes (Signed)
Radiation Oncology         [phone] ________________________________  Multidisciplinary Prostate Cancer Clinic  Initial Radiation Oncology Consultation  Name: Preston Reeves MRN: [id number]  Date: 10/21/2021  DOB: 1941/01/13  ZY:SAYTK, Claudina Lick, MD  Raynelle Bring, MD   REFERRING PHYSICIAN: Raynelle Bring, MD  DIAGNOSIS: 81 y.o. gentleman with stage T1c adenocarcinoma of the prostate with a Gleason's score of 4+3 and a PSA of 9.83  No diagnosis found.  HISTORY OF PRESENT ILLNESS::Preston Reeves is a 81 y.o. gentleman with a history of hereditary hemachromatosis. He had been followed by Dr. Alinda Money for a fluctuating, elevated PSA in the 4-6 range. More recently, it showed a jump to 9.26 in 04/2021, up from 4.84 in 03/2020. Digital rectal examination was performed at follow up in 04/2021 showing no nodules. His PSA was repeated in 07/2021, which remained persistently elevated at 9.83. This prompted prostate MRI on 09/06/21 a questionable area in the left posterior mid to apical gland. With additional images obtained on 09/15/21, this was felt to be a PI-RADS category 4 lesion.  The patient proceeded to MRI fusion biopsy of the prostate on 10/03/21.  The prostate volume measured 27.3 cc.  Out of 16 core biopsies, 11 were positive.  The maximum Gleason score was 4+3, and this was seen in all four ROI MRI lesion samples, left base lateral, left mid lateral (small focus), left base (with perineural invasion), and right mid lateral. Additionally, Gleason 3+4 was seen in right mid (two small foci), and Gleason 3+3 in right base lateral and right apex lateral.   He und He underwent staging PSMA PET scan on 10/16/21 showing: focal activity in prostate gland; no evidence of metastatic adenopathy, visceral metastasis, or skeletal metastasis.  The patient reviewed the biopsy results with his urologist and he has kindly been referred today to the multidisciplinary prostate cancer clinic for presentation of  pathology and radiology studies in our conference for discussion of potential radiation treatment options and clinical evaluation.   PREVIOUS RADIATION THERAPY: No  PAST MEDICAL HISTORY:  has a past medical history of Allergy, Asthma, Cataract, Colon polyps, COPD (chronic obstructive pulmonary disease) ([place]), Diverticulitis of colon with perforation (2010), Diverticulosis, Hemochromatosis, History of BPH, History of COPD, Hyperlipidemia, Hypothyroidism, Incisional hernia, MVP (mitral valve prolapse), and Thyroid disease.    PAST SURGICAL HISTORY: Past Surgical History:  Procedure Laterality Date   COLONOSCOPY  last 09/25/2013   COLONOSCOPY W/ BIOPSIES  2011   Dr Henrene Pastor   COLOSTOMY TAKEDOWN  2010   Dr Barry Dienes   INGUINAL HERNIA REPAIR Left 1999   Dr. Rebekah Chesterfield   INSERTION OF MESH N/A 10/13/2013   Procedure: INSERTION OF MESH;  Surgeon: Michael Boston, MD;  Location: [place];  Service: General;  Laterality: N/A;   LAPAROSCOPIC LYSIS OF ADHESIONS N/A 10/13/2013   Procedure: LAPAROSCOPIC LYSIS OF ADHESIONS;  Surgeon: Michael Boston, MD;  Location: [place];  Service: General;  Laterality: N/A;   PARTIAL COLECTOMY  08/12/2008   [facility]; perforated colon from diverticulitis   UPPER GI ENDOSCOPY     VENTRAL HERNIA REPAIR N/A 10/13/2013   Procedure: REPAIR OF VENTRAL WALL HERNIA WITH UNILATERAL COMPARTMENT SEPARATION;  Surgeon: Michael Boston, MD;  Location: [facility];  Service: General;  Laterality: N/A;    FAMILY HISTORY: family history includes Breast cancer in his mother; Colon cancer in his father; Diabetes in his father; Heart attack in his father; Heart disease in his father.  SOCIAL HISTORY:  reports that he quit  smoking about 18 years ago. His smoking use included cigarettes. He has never used smokeless tobacco. He reports current alcohol use of about 7.0 standard drinks of alcohol per week. He reports that he does not use drugs.  ALLERGIES: Patient has no known allergies.  MEDICATIONS:  Current  Outpatient Medications  Medication Sig Dispense Refill   albuterol (VENTOLIN HFA) 108 (90 Base) MCG/ACT inhaler Inhale 1-2 puffs into the lungs every 6 (six) hours as needed for wheezing or shortness of breath. 18 g 8   Ascorbic Acid (VITAMIN C) 1000 MG tablet Take 1,000 mg by mouth daily.     aspirin 81 MG tablet Take 81 mg by mouth daily.     Calcium Carbonate-Vitamin D (CALCIUM + D PO) Take 1 tablet by mouth daily.      Cholecalciferol (VITAMIN D) 2000 UNITS CAPS Take 2,000 Units by mouth daily.     Docusate Sodium (COLACE PO) Take 200 mg by mouth in the morning and at bedtime.     fexofenadine (ALLEGRA) 180 MG tablet Take 180 mg by mouth daily.     FIBER PO Take by mouth in the morning and at bedtime.     fluticasone (FLONASE) 50 MCG/ACT nasal spray Place 2 sprays into both nostrils daily. 16 g 6   Glucosamine-Chondroit-Vit C-Mn (GLUCOSAMINE 1500 COMPLEX PO) Take by mouth in the morning and at bedtime.     losartan (COZAAR) 50 MG tablet Take 1 tablet (50 mg total) by mouth 2 (two) times daily. 180 tablet 3   metoprolol succinate (TOPROL-XL) 50 MG 24 hr tablet TAKE 1 TABLET(50 MG) BY MOUTH DAILY WITH OR IMMEDIATELY FOLLOWING A MEAL 90 tablet 0   Multiple Vitamins-Minerals (MULTIVITAMIN PO) Take 1 tablet by mouth daily.      Omega-3 Fatty Acids (FISH OIL PO) Take 1,000 mg by mouth in the morning and at bedtime.     Red Yeast Rice 600 MG CAPS Take 1,200 mg by mouth daily.      SALINE NASAL SPRAY NA Place 1 spray into the nose daily as needed (allergies).      SYNTHROID 150 MCG tablet TAKE 1 TABLET(150 MCG) BY MOUTH DAILY 90 tablet 1   umeclidinium-vilanterol (ANORO ELLIPTA) 62.5-25 MCG/ACT AEPB Inhale 1 puff into the lungs daily at 6 (six) AM. 1 each 11   vitamin B-12 (CYANOCOBALAMIN) 1000 MCG tablet Take 1,000 mcg by mouth daily.     vitamin E 1000 UNIT capsule Take 2,000 Units by mouth daily.     No current facility-administered medications for this encounter.    REVIEW OF SYSTEMS:  On  review of systems, the patient reports that he is doing well overall. He denies any chest pain, shortness of breath, cough, fevers, chills, night sweats, unintended weight changes. He denies any bowel disturbances, and denies abdominal pain, nausea or vomiting. He denies any new musculoskeletal or joint aches or pains. His IPSS was 1, indicating minimal urinary symptoms. His SHIM was 2, indicating he has severe erectile dysfunction. A complete review of systems is obtained and is otherwise negative.   PHYSICAL EXAM:  Wt Readings from Last 3 Encounters:  10/21/21 181 lb 3.2 oz (82.2 kg)  05/20/21 180 lb 9.6 oz (81.9 kg)  05/07/21 178 lb 12.8 oz (81.1 kg)   Temp Readings from Last 3 Encounters:  10/21/21 98.4 F (36.9 C) (Temporal)  05/07/21 98 F (36.7 C) (Oral)  12/13/20 98.3 F (36.8 C) (Oral)   BP Readings from Last 3 Encounters:  10/21/21 Marland Kitchen)  140/63  05/20/21 136/72  05/07/21 123/78   Pulse Readings from Last 3 Encounters:  10/21/21 73  05/20/21 68  05/07/21 82    /10  In general this is a well appearing *** man in no acute distress. He's alert and oriented x4 and appropriate throughout the examination. Cardiopulmonary assessment is negative for acute distress and he exhibits normal effort.    KPS = ***  100 - Normal; no complaints; no evidence of disease. 90   - Able to carry on normal activity; minor signs or symptoms of disease. 80   - Normal activity with effort; some signs or symptoms of disease. 17   - Cares for self; unable to carry on normal activity or to do active work. 60   - Requires occasional assistance, but is able to care for most of his personal needs. 50   - Requires considerable assistance and frequent medical care. 55   - Disabled; requires special care and assistance. 6   - Severely disabled; hospital admission is indicated although death not imminent. 67   - Very sick; hospital admission necessary; active supportive treatment necessary. 10   -  Moribund; fatal processes progressing rapidly. 0     - Dead  Karnofsky DA, [place], Craver LS and Burchenal JH [phone]) The use of the nitrogen mustards in the palliative treatment of carcinoma: with particular reference to bronchogenic carcinoma Cancer 1 [id number]   LABORATORY DATA:  Lab Results  Component Value Date   WBC 4.5 05/07/2021   HGB 12.8 (L) 05/07/2021   HCT 37.7 (L) 05/07/2021   MCV 97.4 05/07/2021   PLT 207 05/07/2021   Lab Results  Component Value Date   NA 139 05/07/2021   K 4.3 05/07/2021   CL 104 05/07/2021   CO2 28 05/07/2021   Lab Results  Component Value Date   ALT 13 05/07/2021   AST 19 05/07/2021   ALKPHOS 62 05/07/2021   BILITOT 0.6 05/07/2021     RADIOGRAPHY: NM PET (PSMA) SKULL TO MID THIGH  Result Date: 10/19/2021 CLINICAL DATA:  Prostate carcinoma. Biopsy-proven prostate carcinoma collected on 09/06/2021. PSA equal 58.43. 81 year old male. EXAM: NUCLEAR MEDICINE PET SKULL BASE TO THIGH TECHNIQUE: 9.2 mCi F18 Piflufolastat (Pylarify) was injected intravenously. Full-ring PET imaging was performed from the skull base to thigh after the radiotracer. CT data was obtained and used for attenuation correction and anatomic localization. COMPARISON:  None Available. FINDINGS: NECK No radiotracer activity in neck lymph nodes. Incidental CT finding: None. CHEST No radiotracer accumulation within mediastinal or hilar lymph nodes. No suspicious pulmonary nodules on the CT scan. Incidental CT finding: Extensive centrilobular emphysema the upper lobes. There is bilateral calcified pleural plaques. ABDOMEN/PELVIS Prostate: Intense focal activity posterior midline in the prostate gland with SUV max equal 13.6 on image 201) Lymph nodes: No abnormal radiotracer accumulation within pelvic or abdominal nodes. Liver: No evidence of liver metastasis. Incidental CT finding: Several gallstones. No gallbladder inflammation. Simple fluid attenuation cyst exophytic from the LEFT kidney.  Atherosclerotic calcification of the aorta. SKELETON No focal activity to suggest skeletal metastasis. No suspicious sclerotic lesion on CT portion of IMPRESSION: 1. Focal activity in the prostate gland consistent with primary prostate adenocarcinoma. 2. No evidence metastatic adenopathy in the pelvis or periaortic retroperitoneum. 3. No evidence of visceral metastasis or skeletal metastasis. 4. Incidental findings as above including severe emphysema and calcified pleural plaques. Electronically Signed   By: Suzy Bouchard M.D.   On: 10/19/2021 11:07  IMPRESSION/PLAN: 81 y.o. gentleman with Stage T1c adenocarcinoma of the prostate with a Gleason score of 4+3 and a PSA of 9.83.    We discussed the patient's workup and outlined the nature of prostate cancer in this setting. The patient's T stage, Gleason's score, and PSA put him into the intermediate risk group. Accordingly, he is eligible for a variety of potential treatment options including brachytherapy, 5.5 weeks of external radiation, or prostatectomy. We discussed the available radiation techniques, and focused on the details and logistics of delivery. We discussed and outlined the risks, benefits, short and long-term effects associated with radiotherapy and compared and contrasted these with prostatectomy. We discussed the role of SpaceOAR gel in reducing the rectal toxicity associated with radiotherapy. He appears to have a good understanding of his disease and our treatment recommendations which are of curative intent.  He was encouraged to ask questions that were answered to his/their stated satisfaction.  At the end of the conversation the patient remains undecided between brachytherapy and IMRT. He will reach out to our office or our nurse navigator Kathlee Nations with his final decision. We enjoyed meeting him today and will look forward to participating in his care in the near future.   We personally spent *** minutes in this encounter including  chart review, reviewing radiological studies, meeting face-to-face with the patient, entering orders and completing documentation.    Nicholos Johns, PA-C    Tyler Pita, MD  [facility] Direct Dial: [phone]  Fax: [phone] [email or web address]  Skype  [email or web address]   This document serves as a record of services personally performed by Tyler Pita, MD and Freeman Caldron, PA-C. It was created on their behalf by Wilburn Mylar, a trained medical scribe. The creation of this record is based on the scribe's personal observations and the provider's statements to them. This document has been checked and approved by the attending provider.

## 2021-10-21 NOTE — Consult Note (Signed)
Multi-Disciplinary Clinic     10/21/2021   --------------------------------------------------------------------------------   Jeneen Rinks A. Friesen  MRN: [id number]  DOB: [birth year], 81 year old Male  SSN: [id number]   PRIMARY CARE:  Billey Gosling, MD  REFERRING:  Raynelle Bring, Eduardo Osier  PROVIDER:  Raynelle Bring, M.D.  LOCATION:  [facility] [phone]     --------------------------------------------------------------------------------   CC/HPI: CC: Prostate Cancer   PCP:  Location of consult:  [facility] - [facility]state Cancer [facility]idisciplinary [facility]   Mr. Quincy is an 81 year old healthy gentleman who was noted to have a history of an elevated PSA that was initially monitored due to his advanced age. However, his PSA increased to 9.83 from a baseline between 4-6. An MRI of the prostate was performed that confirmed a PI-RADS 4 lesion of the left apex/mid. An MR/US fusion biopsy was performed on 10/03/21 and this confirmed Gleason 4+3=7 adenocarcinoma in 4 out of 4 targeted biopsies and malignancy in 7 out of 12 systematic biopsy cores.   Family history: None.   Imaging studies: MRI (09/15/21): No EPE, SVI, LAD, or bone lesions. PSMA PET scan (10/16/21): Negative for metastatic disease.   PMH: He has a history of hypothyroidism, hypertension, diverticulitis, hemochromotosis, and mitral valve prolapse.  PSH: Inguinal hernia repair, colostomy   TNM stage: cT1c N0 M0  PSA: 9.83  Gleason score: 4+3=7 (GG 3)  Biopsy (10/03/21): 11/16 cores positive  Left: L lateral mid (5%, 4+3=7), L mid (50%, 3+4=7), L lateral base (40%, 4+3=7), L base (40%, 4+3=7, PNI)  Right: R lateral apex (30%, 3+3=6), R lateral mid (50%, 4+3=7), R lateral base (20%, 3+3=6)  MR target (left mid/apex): 4/4 cores (4+3=7, 70%, 70%, 50%, 50%)  Prostate volume: 27.3 cc   Urinary function: IPSS is 1.  Erectile function: SHIM score is 2.     ALLERGIES: Amoxicillin TABS    MEDICATIONS: Diazepam 10  mg tablet Take 10 mg 30-60 minutes prior to your procedure  Levofloxacin 750 mg tablet Please take one tablet the morning of your biopsy.  Metoprolol Succinate 25 mg tablet, extended release 24 hr 1 tablet PO Daily  Allegra Allergy 1 PO Daily  Aspir 81 1 PO Daily  Biotin 10 mg tablet 1 tablet PO Daily  Calcium + D TABS Oral  Colace 1 PO Daily  Fish Oil CAPS Oral  Losartan Potassium  Multi-Day Vitamins Oral Tablet Oral  Red Yeast Rice 1 PO Daily  Synthroid 1 PO Daily  Vitamin B-12 1,000 mcg tablet 1 tablet PO Daily  Vitamin C TABS Oral  Vitamin D CAPS Oral     GU PSH: Prostate Needle Biopsy - 10/03/2021       PSH Notes: Inguinal Hernia Repair, Colostomy   NON-GU PSH: Colostomy - 2010 Surgical Pathology, Gross And Microscopic Examination For Prostate Needle - 10/03/2021     GU PMH: Elevated PSA - 10/03/2021, - 05/16/2021, - 05/03/2020, - 11/20/2019, - 2017 BPH w/o LUTS - 11/20/2019, Benign localized prostatic hyperplasia without lower urinary tract symptoms (LUTS), - 2014 Prostate nodule w/ LUTS - 2017 Urinary Retention, Unspec, Urinary retention - 2014      PMH Notes:   1) BPH / urinary retention: He was initially seen as a hospital consultation in July 2010 for postoperative urinary retention. He was placed on tamsulosin and subsequently passed a voiding trial. His baseline symptoms included only nocturia one time per night.   Current treatment: Observation   2) Elevated PSA: His PSA was noted  to be 4.4 in November 2019 by his PCP at age 58. This was repeated and was 4.82 in December 2019. Considering his age and life expectancy, we discussed options and he elected to monitor his PSA rather than proceed with a biopsy.     NON-GU PMH: Diverticulitis Hemochromatosis, unspecified Hypothyroidism Nonrheumatic mitral (valve) prolapse    FAMILY HISTORY: 12 daughters - Runs in Family 4 sons - Runs in Family Alzheimer's Disease - Mother Breast Cancer - Mother Colon Cancer -  Father Diabetes - Father father deceased at age 75 from cancer - Father mother deceased at age 2 from old age - Mother Prostate Cancer - Father   SOCIAL HISTORY: Marital Status: Married Preferred Language: English; Ethnicity: Not Hispanic Or Latino; Race: White Current Smoking Status: Patient does not smoke anymore.   Tobacco Use Assessment Completed: Used Tobacco in last 30 days? Historical Smoking Status: Patient was a heavy tobacco smoker until 09/27/2003. Drinks 1 drink per day.  Drinks 1 caffeinated drink per day. Patient's occupation is/was retired.     Notes: Previous History Of Smoking, Alcohol Use, Marital History - Currently Married   REVIEW OF SYSTEMS:    GU Review Male:   Patient denies frequent urination, hard to postpone urination, burning/ pain with urination, get up at night to urinate, leakage of urine, stream starts and stops, trouble starting your streams, and have to strain to urinate .  Gastrointestinal (Upper):   Patient denies nausea and vomiting.  Gastrointestinal (Lower):   Patient denies diarrhea and constipation.  Constitutional:   Patient denies fever, night sweats, weight loss, and fatigue.  Skin:   Patient denies skin rash/ lesion and itching.  Eyes:   Patient denies blurred vision and double vision.  Ears/ Nose/ Throat:   Patient denies sore throat and sinus problems.  Hematologic/Lymphatic:   Patient denies swollen glands and easy bruising.  Cardiovascular:   Patient denies leg swelling and chest pains.  Respiratory:   Patient denies cough and shortness of breath.  Endocrine:   Patient denies excessive thirst.  Musculoskeletal:   Patient denies back pain and joint pain.  Neurological:   Patient denies headaches and dizziness.  Psychologic:   Patient denies depression and anxiety.   VITAL SIGNS: None   MULTI-SYSTEM PHYSICAL EXAMINATION:    Constitutional: Well-nourished. No physical deformities. Normally developed. Good grooming.     Complexity of  Data:  Lab Test Review:   PSA  Records Review:   Pathology Reports, Previous Patient Records  X-Ray Review: PET- PSMA Scan: Reviewed Films.     08/15/21 05/09/21 04/23/20 11/15/19 05/01/19 01/24/19 07/16/18 01/07/18  PSA  Total PSA 9.83 ng/mL 9.26 ng/mL 4.84 ng/mL 5.43 ng/mL 5.55 ng/mL 6.17 ng/mL 4.65 ng/mL 4.82 ng/mL  Free PSA 0.48 ng/mL 0.45 ng/mL 0.38 ng/mL  0.34 ng/mL 0.43 ng/mL 0.38 ng/mL 0.34 ng/mL  % Free PSA 5 % PSA 5 % PSA 8 % PSA  6 % PSA 7 % PSA 8 % PSA 7 % PSA   Notes:                     CLINICAL DATA: Prostate carcinoma. Biopsy-proven prostate carcinoma  collected on 09/06/2021. PSA equal 29.45. 81 year old male.   EXAM:  NUCLEAR MEDICINE PET SKULL BASE TO THIGH   TECHNIQUE:  9.2 mCi F18 Piflufolastat (Pylarify) was injected intravenously.  Full-ring PET imaging was performed from the skull base to thigh  after the radiotracer. CT data was obtained and used for attenuation  correction  and anatomic localization.   COMPARISON: None Available.   FINDINGS:  NECK   No radiotracer activity in neck lymph nodes.   Incidental CT finding: None.   CHEST   No radiotracer accumulation within mediastinal or hilar lymph nodes.  No suspicious pulmonary nodules on the CT scan.   Incidental CT finding: Extensive centrilobular emphysema the upper  lobes. There is bilateral calcified pleural plaques.   ABDOMEN/PELVIS   Prostate: Intense focal activity posterior midline in the prostate  gland with SUV max equal 13.6 on image 201)   Lymph nodes: No abnormal radiotracer accumulation within pelvic or  abdominal nodes.   Liver: No evidence of liver metastasis.   Incidental CT finding: Several gallstones. No gallbladder  inflammation. Simple fluid attenuation cyst exophytic from the LEFT  kidney. Atherosclerotic calcification of the aorta.   SKELETON   No focal activity to suggest skeletal metastasis. No suspicious  sclerotic lesion on CT portion of   IMPRESSION:  1.  Focal activity in the prostate gland consistent with primary  prostate adenocarcinoma.  2. No evidence metastatic adenopathy in the pelvis or periaortic  retroperitoneum.  3. No evidence of visceral metastasis or skeletal metastasis.  4. Incidental findings as above including severe emphysema and  calcified pleural plaques.    Electronically Signed  By: Suzy Bouchard M.D.  On: 10/19/2021 11:07   PROCEDURES: None   ASSESSMENT:      ICD-10 Details  1 GU:   Prostate Cancer - C61    PLAN:           Document Letter(s):  Created for Patient: Clinical Summary         Notes:   1. Unfavorable intermediate risk prostate cancer: I had a detailed discussion with Mr. and Mrs. Wenner today regarding his prostate cancer diagnosis. We reviewed his PSMA PET scan which fortunately does not demonstrate evidence of metastatic disease. He has met with both Dr. Alen Blew and Dr. Tammi Klippel earlier this afternoon.   The patient was counseled about the natural history of prostate cancer and the standard treatment options that are available for prostate cancer. It was explained to him how his age and life expectancy, clinical stage, Gleason score/prognostic grade group, and PSA (and PSA density) affect his prognosis, the decision to proceed with additional staging studies, as well as how that information influences recommended treatment strategies. We discussed the roles for active surveillance, radiation therapy, surgical therapy, androgen deprivation, as well as ablative therapy and other investigational options for the treatment of prostate cancer as appropriate to his individual cancer situation. We discussed the risks and benefits of these options with regard to their impact on cancer control and also in terms of potential adverse events, complications, and impact on quality of life particularly related to urinary and sexual function. The patient was encouraged to ask questions throughout the discussion today and  all questions were answered to his stated satisfaction. In addition, the patient was provided with and/or directed to appropriate resources and literature for further education about prostate cancer and treatment options.   At this time, he is amenable to proceeding with primary radiation therapy. We discussed the potential risks and benefits of short-term androgen deprivation therapy. Considering his advanced age and his desire to avoid side effects related to androgen deprivation, he wishes to forego this. Dr. Tammi Klippel has offered him the options of both primary radiation seed implantation or external beam radiation therapy. He is going to finalize his decision and proceed accordingly this fall. We will  make appropriate plans for him once we know his final decision.   CC: Dr. Tyler Pita  Dr. Zola Button  Dr. Billey Gosling    E & M CODES: We spent 42 minutes dedicated to evaluation and management time, including face to face interaction, discussions on coordination of care, documentation, result review, and discussion with others as applicable.

## 2021-10-21 NOTE — Progress Notes (Signed)
Office Visit Note   Patient: Preston Reeves           Date of Birth: [birth year]           MRN: [id number] Visit Date: 10/21/2021              Requested by: Binnie Rail, MD [place],  [place] [id number] PCP: Binnie Rail, MD   Assessment & Plan: Visit Diagnoses:  1. Numbness and tingling in right hand   2. Numbness and tingling in left hand     Plan: We did talk about treatment options.  I would like to have him set up for bilateral upper extremity nerve conduction studies to determine if he does have carpal tunnel syndrome and the severity.  He did wish to have an injection in his right hand and so I placed a steroid injection in the transverse carpal ligament on the right side which I think is reasonable.  I did offer hand therapy but he says that right now with a lot that he has going on he would like to hold off on therapy until we see what the nerve studies show.  We will work on getting this set up with Dr. Ernestina Patches and then an appointment can be made with me soon after the studies are performed.  All questions and concerns were answered and addressed.  He did tolerate the steroid injection well in his right hand.  Follow-Up Instructions: No follow-ups on file.   Orders:  Orders Placed This Encounter  Procedures   Hand/UE Inj   No orders of the defined types were placed in this encounter.     Procedures: Hand/UE Inj: R carpal tunnel for carpal tunnel syndrome on 10/21/2021 10:36 AM Medications: 1 mL lidocaine 1 %; 40 mg methylPREDNISolone acetate 40 MG/ML      Clinical Data: No additional findings.   Subjective: Chief Complaint  Patient presents with   Left Hand - Pain, Numbness   Right Hand - Pain, Numbness  The patient is someone I am seeing for the first time.  He is a 81 year old right-hand-dominant gentleman who comes in with bilateral hand numbness and tingling as well as significant weakness.  With again the right hand worse than the left.   This been going on for a long period of time.  He started to have trouble with his activities daily living including buttoning his shirt.  He has had no treatments yet and no therapy.  He actually does point to the median nerve distribution of both hands as a source of the numbness and tingling.  He does report that he is currently dealing with prostate cancer.  HPI  Review of Systems There is currently listed no fever, chills, nausea, vomiting.  Objective: Vital Signs: There were no vitals taken for this visit.  Physical Exam He is alert and orient x3 and in no acute distress.  His wife is with him. Ortho Exam On examination he has significantly decreased grip strength on his dominant right side compared to the left side.  The left side is much stronger with grip and pinch strength.  There is only some slight muscle atrophy on the right side.  His numbness and tingling is in the median nerve distribution on both sides.  Both wrist have palpable pulses and his hands are well-perfused.  He has good range of motion of his wrist on both sides. Specialty Comments:  No specialty comments available.  Imaging: No  results found.   PMFS History: Patient Active Problem List   Diagnosis Date Noted   Hyperglycemia 12/04/2018   Diverticulitis of colon 10/09/2015   Elevated PSA 10/09/2015   PVC's (premature ventricular contractions) 07/25/2014   Essential hypertension 07/25/2014   Incisional hernias s/p lap/open VWH repair w mesh 06/20/2013   Hypothyroidism 12/28/2012   MVP (mitral valve prolapse)    Diverticulosis    History of BPH    Hemochromatosis    COPD (chronic obstructive pulmonary disease) (HCC)    Past Medical History:  Diagnosis Date   Allergy    SEASONAL   Asthma    Cataract    Colon polyps    Adenomatous   COPD (chronic obstructive pulmonary disease) (HCC)    Diverticulitis of colon with perforation 2010   Colectomy/ostomy   Diverticulosis    hx of   Hemochromatosis     hx of   History of BPH    History of COPD    Hyperlipidemia    Hypothyroidism    Incisional hernia    MVP (mitral valve prolapse)    Thyroid disease     Family History  Problem Relation Age of Onset   Breast cancer Mother    Colon cancer Father        75's   Heart disease Father    Diabetes Father    Heart attack Father    Stroke Neg Hx    Esophageal cancer Neg Hx    Rectal cancer Neg Hx    Stomach cancer Neg Hx    Colon polyps Neg Hx     Past Surgical History:  Procedure Laterality Date   COLONOSCOPY  last 09/25/2013   COLONOSCOPY W/ BIOPSIES  2011   Dr Henrene Pastor   COLOSTOMY TAKEDOWN  2010   Dr Barry Dienes   INGUINAL HERNIA REPAIR Left 1999   Dr. Rebekah Chesterfield   INSERTION OF MESH N/A 10/13/2013   Procedure: INSERTION OF MESH;  Surgeon: Michael Boston, MD;  Location: [facility];  Service: General;  Laterality: N/A;   LAPAROSCOPIC LYSIS OF ADHESIONS N/A 10/13/2013   Procedure: LAPAROSCOPIC LYSIS OF ADHESIONS;  Surgeon: Michael Boston, MD;  Location: [facility];  Service: General;  Laterality: N/A;   PARTIAL COLECTOMY  08/12/2008   Hartmann; perforated colon from diverticulitis   UPPER GI ENDOSCOPY     VENTRAL HERNIA REPAIR N/A 10/13/2013   Procedure: REPAIR OF VENTRAL WALL HERNIA WITH UNILATERAL COMPARTMENT SEPARATION;  Surgeon: Michael Boston, MD;  Location: [facility];  Service: General;  Laterality: N/A;   Social History   Occupational History   Occupation: Retired    Fish farm manager: RETIRED  Tobacco Use   Smoking status: Former    Types: Cigarettes    Quit date: 2005    Years since quitting: 18.7   Smokeless tobacco: Never  Vaping Use   Vaping Use: Never used  Substance and Sexual Activity   Alcohol use: Yes    Alcohol/week: 7.0 standard drinks of alcohol    Types: 7 Cans of beer per week    Comment: drinks beer weekly   Drug use: No   Sexual activity: Yes

## 2021-10-21 NOTE — Progress Notes (Signed)
Reason for the request:    Prostate cancer  HPI: I was asked by Dr. Alinda Money to evaluate Preston Reeves for the evaluation of prostate cancer.  He is an 81 year old man with history of COPD BPH was found to have an elevated PSA and has been followed by Dr. Alinda Money.  His PSA in 2019 was 4.08 and continues to rise up to 5.43 2021 and 9.26 in April 2023.  In July 2023 was up to 9.3.  MRI of the prostate obtained on September 06, 2021 showed an area of the left paramidline mid to apical prostate with features that are suspicious for PI-RADS 3 prostate cancer.  PSMA PET scan showed no evidence of metastatic disease with confirmed area of uptake within the prostate.  He underwent biopsy on on October 03, 2021 which was an ultrasound fusion biopsy which showed prostate adenocarcinoma Gleason score 4+3 = 7 including 2 cores of the targeted biopsy with multiple Gleason score 4+3 = 7 in the systematic biopsies as well.  Medically, he reports no complaints at this time.  He does have history of urinary retention dating back to 2010 related to BPH.  He does report some nocturia but no other voiding complaints.   He does not report any headaches, blurry vision, syncope or seizures. Does not report any fevers, chills or sweats.  Does not report any cough, wheezing or hemoptysis.  Does not report any chest pain, palpitation, orthopnea or leg edema.  Does not report any nausea, vomiting or abdominal pain.  Does not report any constipation or diarrhea.  Does not report any skeletal complaints.    Does not report frequency, urgency or hematuria.  Does not report any skin rashes or lesions. Does not report any heat or cold intolerance.  Does not report any lymphadenopathy or petechiae.  Does not report any anxiety or depression.  Remaining review of systems is negative.     Past Medical History:  Diagnosis Date   Allergy    SEASONAL   Asthma    Cataract    Colon polyps    Adenomatous   COPD (chronic obstructive pulmonary  disease) ([facility])    Diverticulitis of colon with perforation 2010   Colectomy/ostomy   Diverticulosis    hx of   Hemochromatosis    hx of   History of BPH    History of COPD    Hyperlipidemia    Hypothyroidism    Incisional hernia    MVP (mitral valve prolapse)    Thyroid disease   :   Past Surgical History:  Procedure Laterality Date   COLONOSCOPY  last 09/25/2013   COLONOSCOPY W/ BIOPSIES  2011   Dr Henrene Pastor   COLOSTOMY TAKEDOWN  2010   Dr Barry Dienes   INGUINAL HERNIA REPAIR Left 1999   Dr. Rebekah Chesterfield   INSERTION OF MESH N/A 10/13/2013   Procedure: INSERTION OF MESH;  Surgeon: Michael Boston, MD;  [facility];  Service: General;  Laterality: N/A;   LAPAROSCOPIC LYSIS OF ADHESIONS N/A 10/13/2013   Procedure: LAPAROSCOPIC LYSIS OF ADHESIONS;  Surgeon: Michael Boston, MD;  Location: [facility];  Service: General;  Laterality: N/A;   PARTIAL COLECTOMY  08/12/2008   [facility]mann; perforated colon from diverticulitis   UPPER GI ENDOSCOPY     VENTRAL HERNIA REPAIR N/A 10/13/2013   Procedure: REPAIR OF VENTRAL WALL HERNIA WITH UNILATERAL COMPARTMENT SEPARATION;  Surgeon: Michael Boston, MD;  Location: [place] [facility] OR;  Service: General;  Laterality: N/A;  :   Current Outpatient Medications:  albuterol (VENTOLIN HFA) 108 (90 Base) MCG/ACT inhaler, Inhale 1-2 puffs into the lungs every 6 (six) hours as needed for wheezing or shortness of breath., Disp: 18 g, Rfl: 8   Ascorbic Acid (VITAMIN C) 1000 MG tablet, Take 1,000 mg by mouth daily., Disp: , Rfl:    aspirin 81 MG tablet, Take 81 mg by mouth daily., Disp: , Rfl:    Calcium Carbonate-Vitamin D (CALCIUM + D PO), Take 1 tablet by mouth daily. , Disp: , Rfl:    Cholecalciferol (VITAMIN D) 2000 UNITS CAPS, Take 2,000 Units by mouth daily., Disp: , Rfl:    Docusate Sodium (COLACE PO), Take 200 mg by mouth in the morning and at bedtime., Disp: , Rfl:    fexofenadine (ALLEGRA) 180 MG tablet, Take 180 mg by mouth daily., Disp: , Rfl:    FIBER PO, Take by mouth in the  morning and at bedtime., Disp: , Rfl:    fluticasone (FLONASE) 50 MCG/ACT nasal spray, Place 2 sprays into both nostrils daily., Disp: 16 g, Rfl: 6   Glucosamine-Chondroit-Vit C-Mn (GLUCOSAMINE 1500 COMPLEX PO), Take by mouth in the morning and at bedtime., Disp: , Rfl:    losartan (COZAAR) 50 MG tablet, Take 1 tablet (50 mg total) by mouth 2 (two) times daily., Disp: 180 tablet, Rfl: 3   metoprolol succinate (TOPROL-XL) 50 MG 24 hr tablet, TAKE 1 TABLET(50 MG) BY MOUTH DAILY WITH OR IMMEDIATELY FOLLOWING A MEAL, Disp: 90 tablet, Rfl: 0   Multiple Vitamins-Minerals (MULTIVITAMIN PO), Take 1 tablet by mouth daily. , Disp: , Rfl:    Omega-3 Fatty Acids (FISH OIL PO), Take 1,000 mg by mouth in the morning and at bedtime., Disp: , Rfl:    Red Yeast Rice 600 MG CAPS, Take 1,200 mg by mouth daily. , Disp: , Rfl:    SALINE NASAL SPRAY NA, Place 1 spray into the nose daily as needed (allergies). , Disp: , Rfl:    SYNTHROID 150 MCG tablet, TAKE 1 TABLET(150 MCG) BY MOUTH DAILY, Disp: 90 tablet, Rfl: 1   umeclidinium-vilanterol (ANORO ELLIPTA) 62.5-25 MCG/ACT AEPB, Inhale 1 puff into the lungs daily at 6 (six) AM., Disp: 1 each, Rfl: 11   vitamin B-12 (CYANOCOBALAMIN) 1000 MCG tablet, Take 1,000 mcg by mouth daily., Disp: , Rfl:    vitamin E 1000 UNIT capsule, Take 2,000 Units by mouth daily., Disp: , Rfl: :  No Known Allergies:   Family History  Problem Relation Age of Onset   Breast cancer Mother    Colon cancer Father        12's   Heart disease Father    Diabetes Father    Heart attack Father    Stroke Neg Hx    Esophageal cancer Neg Hx    Rectal cancer Neg Hx    Stomach cancer Neg Hx    Colon polyps Neg Hx   :   Social History   Socioeconomic History   Marital status: Married    Spouse name: Not on file   Number of children: Not on file   Years of education: Not on file   Highest education level: Not on file  Occupational History   Occupation: Retired    Fish farm manager: RETIRED   Tobacco Use   Smoking status: Former    Types: Cigarettes    Quit date: 2005    Years since quitting: 18.7   Smokeless tobacco: Never  Vaping Use   Vaping Use: Never used  Substance and Sexual Activity  Alcohol use: Yes    Alcohol/week: 7.0 standard drinks of alcohol    Types: 7 Cans of beer per week    Comment: drinks beer weekly   Drug use: No   Sexual activity: Yes  Other Topics Concern   Not on file  Social History Narrative   Married. Education: [employer].   Social Determinants of Health   Financial Resource Strain: Low Risk  (12/13/2020)   Overall Financial Resource Strain (CARDIA)    Difficulty of Paying Living Expenses: Not hard at all  Food Insecurity: No Food Insecurity (12/13/2020)   Hunger Vital Sign    Worried About Running Out of Food in the Last Year: Never true    Ran Out of Food in the Last Year: Never true  Transportation Needs: No Transportation Needs (12/13/2020)   PRAPARE - Hydrologist (Medical): No    Lack of Transportation (Non-Medical): No  Physical Activity: Sufficiently Active (12/13/2020)   Exercise Vital Sign    Days of Exercise per Week: 5 days    Minutes of Exercise per Session: 30 min  Stress: No Stress Concern Present (12/13/2020)   [place]    Feeling of Stress : Not at all  Social Connections: Bells (12/13/2020)   Social Connection and Isolation Panel [NHANES]    Frequency of Communication with Friends and Family: More than three times a week    Frequency of Social Gatherings with Friends and Family: More than three times a week    Attends Religious Services: More than 4 times per year    Active Member of [employer] or Organizations: Yes    Attends Archivist Meetings: More than 4 times per year    Marital Status: Married  Human resources officer Violence: Not At Risk (12/13/2020)   Humiliation, Afraid, Rape, and Kick  questionnaire    Fear of Current or Ex-Partner: No    Emotionally Abused: No    Physically Abused: No    Sexually Abused: No  :  Pertinent items are noted in HPI.  Exam:  General appearance: alert and cooperative appeared without distress. Head: atraumatic without any abnormalities. Eyes: conjunctivae/corneas clear. PERRL.  Sclera anicteric. Throat: lips, mucosa, and tongue normal; without oral thrush or ulcers. Resp: clear to auscultation bilaterally without rhonchi, wheezes or dullness to percussion. Cardio: regular rate and rhythm, S1, S2 normal, no murmur, click, rub or gallop GI: soft, non-tender; bowel sounds normal; no masses,  no organomegaly Skin: Skin color, texture, turgor normal. No rashes or lesions Lymph nodes: Cervical, supraclavicular, and axillary nodes normal. Neurologic: Grossly normal without any motor, sensory or deep tendon reflexes. Musculoskeletal: No joint deformity or effusion.    NM PET (PSMA) SKULL TO MID THIGH  Result Date: 10/19/2021 CLINICAL DATA:  Prostate carcinoma. Biopsy-proven prostate carcinoma collected on 09/06/2021. PSA equal 23.59. 81 year old male. EXAM: NUCLEAR MEDICINE PET SKULL BASE TO THIGH TECHNIQUE: 9.2 mCi F18 Piflufolastat (Pylarify) was injected intravenously. Full-ring PET imaging was performed from the skull base to thigh after the radiotracer. CT data was obtained and used for attenuation correction and anatomic localization. COMPARISON:  None Available. FINDINGS: NECK No radiotracer activity in neck lymph nodes. Incidental CT finding: None. CHEST No radiotracer accumulation within mediastinal or hilar lymph nodes. No suspicious pulmonary nodules on the CT scan. Incidental CT finding: Extensive centrilobular emphysema the upper lobes. There is bilateral calcified pleural plaques. ABDOMEN/PELVIS Prostate: Intense focal activity posterior midline in the prostate gland  with SUV max equal 13.6 on image 201) Lymph nodes: No abnormal  radiotracer accumulation within pelvic or abdominal nodes. Liver: No evidence of liver metastasis. Incidental CT finding: Several gallstones. No gallbladder inflammation. Simple fluid attenuation cyst exophytic from the LEFT kidney. Atherosclerotic calcification of the aorta. SKELETON No focal activity to suggest skeletal metastasis. No suspicious sclerotic lesion on CT portion of IMPRESSION: 1. Focal activity in the prostate gland consistent with primary prostate adenocarcinoma. 2. No evidence metastatic adenopathy in the pelvis or periaortic retroperitoneum. 3. No evidence of visceral metastasis or skeletal metastasis. 4. Incidental findings as above including severe emphysema and calcified pleural plaques. Electronically Signed   By: Suzy Bouchard M.D.   On: 10/19/2021 11:07    Assessment and Plan:    81 year old with prostate cancer diagnosed in September 2023.  He was found to have Gleason score of 7 and a PSA of 9.83.  He was found to have unfavorable intermediate risk prostate cancer without any evidence of metastatic disease.  His case was discussed today in the prostate cancer multidisciplinary clinic including review of his imaging studies and pathology results with radiology and pathology.  Treatment choices were also reviewed which include primary surgical therapy versus radiation therapy with androgen deprivation for his unfavorable intermediate risk prostate cancer.  Given his age, the risk of surgical complications will be increased and radiation therapy and for deprivation may be a better option.  Complication associated with androgen deprivation were discussed these include hot flashes, weight gain and sexual dysfunction.  The treatment for advanced prostate cancer was discussed in detail.  These include androgen receptor pathway inhibitors, systemic chemotherapy among others and will be deferred if he has advanced disease.  These options are not curative and will be deferred unless he  has advanced disease in the future.  He will consider these options and make a decision in the near future.  30  minutes were dedicated to this visit. The time was spent on reviewing , imaging studies, discussing treatment options, and answering questions regarding future plan.     A copy of this consult has been forwarded to the requesting physician.

## 2021-10-22 NOTE — Addendum Note (Signed)
Addended by: Robyne Peers on: 10/22/2021 01:42 PM   Modules accepted: Orders

## 2021-10-23 ENCOUNTER — Telehealth: Payer: Self-pay | Admitting: Genetic Counselor

## 2021-10-23 NOTE — Telephone Encounter (Signed)
Preston Reeves was seen by a genetic counselor during the prostate multidisciplinary clinic on 10/21/2021. In addition to his personal history of prostate cancer, he reported a family history of his mother diagnosed with breast cancer in her 51s and father diagnosed with colon cancer in his 32s. He does not meet NCCN criteria for genetic testing at this time. He was still offered genetic counseling and testing but declined. We encourage him to contact us if there are any changes to his personal or family history of cancer. If he meets NCCN criteria based on the updated personal/family history, he would be recommended to have genetic counseling and testing.

## 2021-10-27 ENCOUNTER — Encounter: Payer: Self-pay | Admitting: Family

## 2021-10-28 ENCOUNTER — Ambulatory Visit (INDEPENDENT_AMBULATORY_CARE_PROVIDER_SITE_OTHER): Payer: PPO | Admitting: Physical Medicine and Rehabilitation

## 2021-10-28 ENCOUNTER — Encounter: Payer: Self-pay | Admitting: Physical Medicine and Rehabilitation

## 2021-10-28 DIAGNOSIS — R202 Paresthesia of skin: Secondary | ICD-10-CM

## 2021-10-28 NOTE — Progress Notes (Signed)
Numbness and tingling and both hands. Right is the worse. Symptoms worse over the last month. No hx of previous nerve study.

## 2021-10-28 NOTE — Progress Notes (Signed)
RN spoke with patient to follow up after being seen @ [facility] on 9/26 for his stage T1c adenocarcinoma of the prostate with a Gleason's score of 4+3 and a PSA of 9.83.   Patient is still currently undecided regarding his treatment decision.  RN provided education on process for brachytherapy and daily radiation.  Patient did have some questions specifically related to insurance and cost that he would like answered, will inform financial advocates to see if they can assist him with answers.    Patient agreeable for RN to follow up to reassess any navigation needs and treatment decision.

## 2021-10-30 NOTE — Progress Notes (Signed)
Patient has contacted RN to update that he has decided to move forward with 5.5 weeks of daily radiation for his prostate cancer treatment.    RN provided education on next steps for fiducial's, spaceOAR, and CT Simulation.  Pt verbalized understanding.   MD's notified.    Plan of care in progress.

## 2021-10-30 NOTE — Procedures (Signed)
EMG & NCV Findings: Evaluation of the left median motor nerve showed prolonged distal onset latency (7.0 ms) and decreased conduction velocity (Elbow-Wrist, 45 m/s).  The right median motor nerve showed prolonged distal onset latency (11.5 ms), reduced amplitude (0.2 mV), and decreased conduction velocity (Elbow-Wrist, 39 m/s).  The left median (across palm) sensory nerve showed prolonged distal peak latency (Wrist, 5.5 ms), reduced amplitude (8.5 V), and prolonged distal peak latency (Palm, 2.9 ms).  The right median (across palm) sensory nerve showed no response (Wrist) and prolonged distal peak latency (Palm, 2.8 ms).  All remaining nerves (as indicated in the following tables) were within normal limits.  Left vs. Right side comparison data for the median motor nerve indicates abnormal L-R latency difference (4.5 ms) and abnormal L-R amplitude difference (96.2 %).  The ulnar motor nerve indicates abnormal L-R amplitude difference (47.1 %) and abnormal L-R velocity difference (A Elbow-B Elbow, 23 m/s).  All remaining left vs. right side differences were within normal limits.    Needle evaluation of the right abductor pollicis brevis muscle showed increased insertional activity, increased spontaneous activity, and diminished recruitment.  All remaining muscles (as indicated in the following table) showed no evidence of electrical instability.    Impression: The above electrodiagnostic study is ABNORMAL and reveals evidence of:  1. a severe right median nerve entrapment at the wrist (carpal tunnel syndrome) affecting sensory and motor components. The lesion is characterized by sensory and motor demyelination with evidence of significant axonal injury.   2. a moderate to severe left median nerve entrapment at the wrist (carpal tunnel syndrome) affecting sensory and motor components.  There is no significant electrodiagnostic evidence of any other focal nerve entrapment, brachial plexopathy or cervical  radiculopathy.     Recommendations: 1.  Follow-up with referring physician. 2.  Continue current management of symptoms. 3.  Suggest surgical evaluation.  ___________________________ Laurence Spates FAAPMR Board Certified, American Board of Physical Medicine and Rehabilitation    Nerve Conduction Studies Anti Sensory Summary Table   Stim Site NR Peak (ms) Norm Peak (ms) P-T Amp (V) Norm P-T Amp Site1 Site2 Delta-P (ms) Dist (cm) Vel (m/s) Norm Vel (m/s)  Left Median Acr Palm Anti Sensory (2nd Digit)  32.4C  Wrist    *5.5 <3.6 *8.5 >10 Wrist Palm 2.6 0.0    Palm    *2.9 <2.0 6.9         Right Median Acr Palm Anti Sensory (2nd Digit)  32.2C  Wrist *NR  <3.6  >10 Wrist Palm  0.0    Palm    *2.8 <2.0 11.1         Left Radial Anti Sensory (Base 1st Digit)  32.4C  Wrist    2.3 <3.1 24.7  Wrist Base 1st Digit 2.3 0.0    Right Radial Anti Sensory (Base 1st Digit)  32C  Wrist    2.3 <3.1 11.5  Wrist Base 1st Digit 2.3 0.0    Left Ulnar Anti Sensory (5th Digit)  32.6C  Wrist    3.4 <3.7 23.5 >15.0 Wrist 5th Digit 3.4 14.0 41 >38  Right Ulnar Anti Sensory (5th Digit)  32.3C  Wrist    3.4 <3.7 19.9 >15.0 Wrist 5th Digit 3.4 14.0 41 >38   Motor Summary Table   Stim Site NR Onset (ms) Norm Onset (ms) O-P Amp (mV) Norm O-P Amp Site1 Site2 Delta-0 (ms) Dist (cm) Vel (m/s) Norm Vel (m/s)  Left Median Motor (Abd Poll Brev)  32.4C  Wrist    *  7.0 <4.2 5.3 >5 Elbow Wrist 5.2 23.5 *45 >50  Elbow    12.2  5.4         Right Median Motor (Abd Poll Brev)  32C  Wrist    *11.5 <4.2 *0.2 >5 Elbow Wrist 6.0 23.5 *39 >50  Elbow    17.5  0.3         Left Ulnar Motor (Abd Dig Min)  32.4C  Wrist    3.2 <4.2 8.5 >3 B Elbow Wrist 4.1 23.0 56 >53  B Elbow    7.3  7.6  A Elbow B Elbow 1.8 10.0 56 >53  A Elbow    9.1  7.1         Right Ulnar Motor (Abd Dig Min)  32.1C  Wrist    3.0 <4.2 4.5 >3 B Elbow Wrist 4.4 23.5 53 >53  B Elbow    7.4  6.6  A Elbow B Elbow 1.4 11.0 79 >53  A Elbow    8.8  6.2           EMG   Side Muscle Nerve Root Ins Act Fibs Psw Amp Dur Poly Recrt Int Fraser Din Comment  Right Abd Poll Brev Median C8-T1 *Incr *3+ *2+ Nml Nml 0 *Reduced Nml   Right 1stDorInt Ulnar C8-T1 Nml Nml Nml Nml Nml 0 Nml Nml   Right PronatorTeres Median C6-7 Nml Nml Nml Nml Nml 0 Nml Nml   Right Biceps Musculocut C5-6 Nml Nml Nml Nml Nml 0 Nml Nml   Right Deltoid Axillary C5-6 Nml Nml Nml Nml Nml 0 Nml Nml     Nerve Conduction Studies Anti Sensory Left/Right Comparison   Stim Site L Lat (ms) R Lat (ms) L-R Lat (ms) L Amp (V) R Amp (V) L-R Amp (%) Site1 Site2 L Vel (m/s) R Vel (m/s) L-R Vel (m/s)  Median Acr Palm Anti Sensory (2nd Digit)  32.4C  Wrist *5.5   *8.5   Wrist Palm     Palm *2.9 *2.8 0.1 6.9 11.1 37.8       Radial Anti Sensory (Base 1st Digit)  32.4C  Wrist 2.3 2.3 0.0 24.7 11.5 53.4 Wrist Base 1st Digit     Ulnar Anti Sensory (5th Digit)  32.6C  Wrist 3.4 3.4 0.0 23.5 19.9 15.3 Wrist 5th Digit 41 41 0   Motor Left/Right Comparison   Stim Site L Lat (ms) R Lat (ms) L-R Lat (ms) L Amp (mV) R Amp (mV) L-R Amp (%) Site1 Site2 L Vel (m/s) R Vel (m/s) L-R Vel (m/s)  Median Motor (Abd Poll Brev)  32.4C  Wrist *7.0 *11.5 *4.5 5.3 *0.2 *96.2 Elbow Wrist *45 *39 6  Elbow 12.2 17.5 5.3 5.4 0.3 94.4       Ulnar Motor (Abd Dig Min)  32.4C  Wrist 3.2 3.0 0.2 8.5 4.5 *47.1 B Elbow Wrist 56 53 3  B Elbow 7.3 7.4 0.1 7.6 6.6 13.2 A Elbow B Elbow 56 79 *23  A Elbow 9.1 8.8 0.3 7.1 6.2 12.7          Waveforms:

## 2021-11-01 ENCOUNTER — Other Ambulatory Visit: Payer: Self-pay | Admitting: Hematology & Oncology

## 2021-11-03 ENCOUNTER — Encounter: Payer: Self-pay | Admitting: Family

## 2021-11-04 ENCOUNTER — Other Ambulatory Visit: Payer: Self-pay | Admitting: Urology

## 2021-11-04 NOTE — Progress Notes (Signed)
Preston Reeves - 81 y.o. male MRN [id number]  Date of birth: [birth year]  Office Visit Note: Visit Date: 10/28/2021 PCP: Binnie Rail, MD Referred by: Mcarthur Rossetti*  Subjective: Chief Complaint  Patient presents with   Left Hand - Numbness   Right Hand - Numbness   HPI:  Preston Reeves is a 81 y.o. male who comes in today at the request of Dr. Jean Rosenthal for electrodiagnostic study of the Bilateral upper extremities.  Patient is Right hand dominant.  He reports chronic right more than left bilateral hand pain numbness and tingling with weakness for close to 30 years.  He has been having ongoing symptoms recently with worsening ability to do daily living with buttoning and manipulating fine objects.  No prior electrodiagnostic studies.  No frank radicular symptoms.    ROS Otherwise per HPI.  Assessment & Plan: Visit Diagnoses:    ICD-10-CM   1. Paresthesia of skin  R20.2 NCV with EMG (electromyography)      Plan: Impression: The above electrodiagnostic study is ABNORMAL and reveals evidence of:  1. a severe right median nerve entrapment at the wrist (carpal tunnel syndrome) affecting sensory and motor components. The lesion is characterized by sensory and motor demyelination with evidence of significant axonal injury.   2. a moderate to severe left median nerve entrapment at the wrist (carpal tunnel syndrome) affecting sensory and motor components.  There is no significant electrodiagnostic evidence of any other focal nerve entrapment, brachial plexopathy or cervical radiculopathy.     Recommendations: 1.  Follow-up with referring physician. 2.  Continue current management of symptoms. 3.  Suggest surgical evaluation.  Meds & Orders: No orders of the defined types were placed in this encounter.   Orders Placed This Encounter  Procedures   NCV with EMG (electromyography)    Follow-up: Return in about 2 weeks (around 11/11/2021) for Jean Rosenthal, MD.   Procedures: No procedures performed  EMG & NCV Findings: Evaluation of the left median motor nerve showed prolonged distal onset latency (7.0 ms) and decreased conduction velocity (Elbow-Wrist, 45 m/s).  The right median motor nerve showed prolonged distal onset latency (11.5 ms), reduced amplitude (0.2 mV), and decreased conduction velocity (Elbow-Wrist, 39 m/s).  The left median (across palm) sensory nerve showed prolonged distal peak latency (Wrist, 5.5 ms), reduced amplitude (8.5 V), and prolonged distal peak latency (Palm, 2.9 ms).  The right median (across palm) sensory nerve showed no response (Wrist) and prolonged distal peak latency (Palm, 2.8 ms).  All remaining nerves (as indicated in the following tables) were within normal limits.  Left vs. Right side comparison data for the median motor nerve indicates abnormal L-R latency difference (4.5 ms) and abnormal L-R amplitude difference (96.2 %).  The ulnar motor nerve indicates abnormal L-R amplitude difference (47.1 %) and abnormal L-R velocity difference (A Elbow-B Elbow, 23 m/s).  All remaining left vs. right side differences were within normal limits.    Needle evaluation of the right abductor pollicis brevis muscle showed increased insertional activity, increased spontaneous activity, and diminished recruitment.  All remaining muscles (as indicated in the following table) showed no evidence of electrical instability.    Impression: The above electrodiagnostic study is ABNORMAL and reveals evidence of:  1. a severe right median nerve entrapment at the wrist (carpal tunnel syndrome) affecting sensory and motor components. The lesion is characterized by sensory and motor demyelination with evidence of significant axonal injury.   2. a moderate to severe  left median nerve entrapment at the wrist (carpal tunnel syndrome) affecting sensory and motor components.  There is no significant electrodiagnostic evidence of any other  focal nerve entrapment, brachial plexopathy or cervical radiculopathy.     Recommendations: 1.  Follow-up with referring physician. 2.  Continue current management of symptoms. 3.  Suggest surgical evaluation.  ___________________________ Laurence Spates FAAPMR Board Certified, American Board of Physical Medicine and Rehabilitation    Nerve Conduction Studies Anti Sensory Summary Table   Stim Site NR Peak (ms) Norm Peak (ms) P-T Amp (V) Norm P-T Amp Site1 Site2 Delta-P (ms) Dist (cm) Vel (m/s) Norm Vel (m/s)  Left Median Acr Palm Anti Sensory (2nd Digit)  32.4C  Wrist    *5.5 <3.6 *8.5 >10 Wrist Palm 2.6 0.0    Palm    *2.9 <2.0 6.9         Right Median Acr Palm Anti Sensory (2nd Digit)  32.2C  Wrist *NR  <3.6  >10 Wrist Palm  0.0    Palm    *2.8 <2.0 11.1         Left Radial Anti Sensory (Base 1st Digit)  32.4C  Wrist    2.3 <3.1 24.7  Wrist Base 1st Digit 2.3 0.0    Right Radial Anti Sensory (Base 1st Digit)  32C  Wrist    2.3 <3.1 11.5  Wrist Base 1st Digit 2.3 0.0    Left Ulnar Anti Sensory (5th Digit)  32.6C  Wrist    3.4 <3.7 23.5 >15.0 Wrist 5th Digit 3.4 14.0 41 >38  Right Ulnar Anti Sensory (5th Digit)  32.3C  Wrist    3.4 <3.7 19.9 >15.0 Wrist 5th Digit 3.4 14.0 41 >38   Motor Summary Table   Stim Site NR Onset (ms) Norm Onset (ms) O-P Amp (mV) Norm O-P Amp Site1 Site2 Delta-0 (ms) Dist (cm) Vel (m/s) Norm Vel (m/s)  Left Median Motor (Abd Poll Brev)  32.4C  Wrist    *7.0 <4.2 5.3 >5 Elbow Wrist 5.2 23.5 *45 >50  Elbow    12.2  5.4         Right Median Motor (Abd Poll Brev)  32C  Wrist    *11.5 <4.2 *0.2 >5 Elbow Wrist 6.0 23.5 *39 >50  Elbow    17.5  0.3         Left Ulnar Motor (Abd Dig Min)  32.4C  Wrist    3.2 <4.2 8.5 >3 B Elbow Wrist 4.1 23.0 56 >53  B Elbow    7.3  7.6  A Elbow B Elbow 1.8 10.0 56 >53  A Elbow    9.1  7.1         Right Ulnar Motor (Abd Dig Min)  32.1C  Wrist    3.0 <4.2 4.5 >3 B Elbow Wrist 4.4 23.5 53 >53  B Elbow    7.4  6.6  A  Elbow B Elbow 1.4 11.0 79 >53  A Elbow    8.8  6.2          EMG   Side Muscle Nerve Root Ins Act Fibs Psw Amp Dur Poly Recrt Int Fraser Din Comment  Right Abd Poll Brev Median C8-T1 *Incr *3+ *2+ Nml Nml 0 *Reduced Nml   Right 1stDorInt Ulnar C8-T1 Nml Nml Nml Nml Nml 0 Nml Nml   Right PronatorTeres Median C6-7 Nml Nml Nml Nml Nml 0 Nml Nml   Right Biceps Musculocut C5-6 Nml Nml Nml Nml Nml 0 Nml Nml  Right Deltoid Axillary C5-6 Nml Nml Nml Nml Nml 0 Nml Nml     Nerve Conduction Studies Anti Sensory Left/Right Comparison   Stim Site L Lat (ms) R Lat (ms) L-R Lat (ms) L Amp (V) R Amp (V) L-R Amp (%) Site1 Site2 L Vel (m/s) R Vel (m/s) L-R Vel (m/s)  Median Acr Palm Anti Sensory (2nd Digit)  32.4C  Wrist *5.5   *8.5   Wrist Palm     Palm *2.9 *2.8 0.1 6.9 11.1 37.8       Radial Anti Sensory (Base 1st Digit)  32.4C  Wrist 2.3 2.3 0.0 24.7 11.5 53.4 Wrist Base 1st Digit     Ulnar Anti Sensory (5th Digit)  32.6C  Wrist 3.4 3.4 0.0 23.5 19.9 15.3 Wrist 5th Digit 41 41 0   Motor Left/Right Comparison   Stim Site L Lat (ms) R Lat (ms) L-R Lat (ms) L Amp (mV) R Amp (mV) L-R Amp (%) Site1 Site2 L Vel (m/s) R Vel (m/s) L-R Vel (m/s)  Median Motor (Abd Poll Brev)  32.4C  Wrist *7.0 *11.5 *4.5 5.3 *0.2 *96.2 Elbow Wrist *45 *39 6  Elbow 12.2 17.5 5.3 5.4 0.3 94.4       Ulnar Motor (Abd Dig Min)  32.4C  Wrist 3.2 3.0 0.2 8.5 4.5 *47.1 B Elbow Wrist 56 53 3  B Elbow 7.3 7.4 0.1 7.6 6.6 13.2 A Elbow B Elbow 56 79 *23  A Elbow 9.1 8.8 0.3 7.1 6.2 12.7          Waveforms:                      Clinical History: No specialty comments available.     Objective:  VS:  HT:    WT:   BMI:     BP:   HR: bpm  TEMP: ( )  RESP:  Physical Exam Musculoskeletal:        General: No tenderness.     Comments: Inspection reveals flattening of the right APB but no atrophy of the left APB or bilateral FDI or hand intrinsics. There is no swelling, color changes, allodynia or dystrophic  changes. There is 5 out of 5 strength in the bilateral wrist extension, finger abduction and long finger flexion.  There is impaired sensation in the bilateral median nerve distributions.There is a negative Hoffmann's test bilaterally.  Skin:    General: Skin is warm and dry.     Findings: No erythema or rash.  Neurological:     General: No focal deficit present.     Mental Status: He is alert and oriented to person, place, and time.     Sensory: No sensory deficit.     Motor: No weakness or abnormal muscle tone.     Coordination: Coordination normal.     Gait: Gait normal.  Psychiatric:        Mood and Affect: Mood normal.        Behavior: Behavior normal.        Thought Content: Thought content normal.      Imaging: No results found.

## 2021-11-05 ENCOUNTER — Encounter (HOSPITAL_BASED_OUTPATIENT_CLINIC_OR_DEPARTMENT_OTHER): Payer: Self-pay | Admitting: Urology

## 2021-11-05 NOTE — Progress Notes (Addendum)
11/05/2021 11:19 AM Spoke w/ via phone for pre-op interview---Patient Lab needs dos----  Chem 8 Lab results------cbc/bmp 05/07/21 and ekg 05/20/21 COVID test -----patient states asymptomatic no test needed Arrive at -------1045 NPO after MN NO Solid Food.  Clear liquids from MN until---0845 Med rec completed- Advised to hold ASA x 5 days from Dr. Alinda Money Medications to take morning of surgery -----Synthroid and Metoprolol prior to 0845 Diabetic medication -----na Patient instructed no nail polish to be worn day of surgery Patient instructed to bring photo id and insurance card day of surgery Patient aware to have Driver (ride ) / caregiver    for 24 hours after surgery - Wife [facility] Patient Special Instructions -----na Pre-Op special Istructions -----na Patient verbalized understanding of instructions that were given at this phone interview. Patient denies shortness of breath, chest pain, fever, cough at this phone interview.  Pt. Has known LBBB, 2/6 heart murmer and reported MV prolapse. Seen in office 05/20/21 Dr. Gwyndolyn Kaufman with [facility] stated all findings stable and follow up in one year. Pt. Denies CP or SOB.  Quandarius Nill, Arville Lime

## 2021-11-12 ENCOUNTER — Ambulatory Visit: Payer: PPO | Admitting: Orthopaedic Surgery

## 2021-11-12 ENCOUNTER — Encounter: Payer: Self-pay | Admitting: Orthopaedic Surgery

## 2021-11-12 DIAGNOSIS — G5601 Carpal tunnel syndrome, right upper limb: Secondary | ICD-10-CM | POA: Diagnosis not present

## 2021-11-12 NOTE — Progress Notes (Signed)
HPI: Preston Reeves comes in today for follow-up status post bilateral upper extremity nerve conduction studies to rule out carpal tunnel syndrome.  He states that the cortisone injection for carpal tunnel on the right is helped he still has some numbness tingling fingertips several fingers in the median distribution.  He states his left hand only bothers him first thing in the morning where the hand is on fire but otherwise he has no pain in the hand throughout the day.  He is right-hand dominant.  He is about to start treatment for prostate cancer in the next week and will be seeing oncology next Monday and Thursday.  He is right-hand dominant. EMG nerve conduction studies showed severe carpal tunnel syndrome on the right and moderate to severe carpal tunnel syndrome on the left.  Physical exam: General well-developed well-nourished male no acute distress. Bilateral hands he has good range of motion of the fingers of both hands.  Subjective no decrease sensation bilateral hands throughout to light touch.  No thenar atrophy.  Positive Tinel's over the median nerve at the wrist on the right negative on the left.  Impression: Severe carpal tunnel syndrome right Moderate to severe carpal tunnel syndrome left  Plan: Given the EMG nerve conduction studies recommend surgical intervention.  This to be carpal tunnel release we will start with his severe hand.  He is agreeable.  Discussed procedure and postoperative protocol with him.  Questions were encouraged and answered by Dr. Ninfa Linden and myself.  We will work on scheduling for him this in the near future.  See him back 2 weeks postop.

## 2021-11-14 NOTE — H&P (Signed)
--------------------------------------------------------------------------------   Preston Reeves  MRN: [id number]  DOB: 1940-07-26, 81 year old Male  SSN: [id number]   PRIMARY CARE:  Billey Gosling, MD  REFERRING:  Raynelle Bring, Eduardo Osier  PROVIDER:  Raynelle Bring, M.D.  LOCATION:  [facility] [phone]     --------------------------------------------------------------------------------   CC/HPI: CC: Prostate Cancer   PCP:  Location of consult: [facility] - Prostate Cancer Multidisciplinary [facility]   Preston Reeves is an 81 year old healthy gentleman who was noted to have a history of an elevated PSA that was initially monitored due to his advanced age. However, his PSA increased to 9.83 from a baseline between 4-6. An MRI of the prostate was performed that confirmed a PI-RADS 4 lesion of the left apex/mid. An MR/US fusion biopsy was performed on 10/03/21 and this confirmed Gleason 4+3=7 adenocarcinoma in 4 out of 4 targeted biopsies and malignancy in 7 out of 12 systematic biopsy cores.   Family history: None.   Imaging studies: MRI (09/15/21): No EPE, SVI, LAD, or bone lesions. PSMA PET scan (10/16/21): Negative for metastatic disease.   PMH: He has a history of hypothyroidism, hypertension, diverticulitis, hemochromotosis, and mitral valve prolapse.  PSH: Inguinal hernia repair, colostomy   TNM stage: cT1c N0 M0  PSA: 9.83  Gleason score: 4+3=7 (GG 3)  Biopsy (10/03/21): 11/16 cores positive  Left: L lateral mid (5%, 4+3=7), L mid (50%, 3+4=7), L lateral base (40%, 4+3=7), L base (40%, 4+3=7, PNI)  Right: R lateral apex (30%, 3+3=6), R lateral mid (50%, 4+3=7), R lateral base (20%, 3+3=6)  MR target (left mid/apex): 4/4 cores (4+3=7, 70%, 70%, 50%, 50%)  Prostate volume: 27.3 cc   Urinary function: IPSS is 1.  Erectile function: SHIM score is 2.     ALLERGIES: Amoxicillin TABS    MEDICATIONS: Diazepam 10 mg tablet Take 10 mg 30-60 minutes prior to  your procedure  Levofloxacin 750 mg tablet Please take one tablet the morning of your biopsy.  Metoprolol Succinate 25 mg tablet, extended release 24 hr 1 tablet PO Daily  Allegra Allergy 1 PO Daily  Aspir 81 1 PO Daily  Biotin 10 mg tablet 1 tablet PO Daily  Calcium + D TABS Oral  Colace 1 PO Daily  Fish Oil CAPS Oral  Losartan Potassium  Multi-Day Vitamins Oral Tablet Oral  Red Yeast Rice 1 PO Daily  Synthroid 1 PO Daily  Vitamin B-12 1,000 mcg tablet 1 tablet PO Daily  Vitamin C TABS Oral  Vitamin D CAPS Oral     GU PSH: Prostate Needle Biopsy - 10/03/2021       PSH Notes: Inguinal Hernia Repair, Colostomy   NON-GU PSH: Colostomy - 2010 Surgical Pathology, Gross And Microscopic Examination For Prostate Needle - 10/03/2021     GU PMH: Elevated PSA - 10/03/2021, - 05/16/2021, - 05/03/2020, - 11/20/2019, - 2017 BPH w/o LUTS - 11/20/2019, Benign localized prostatic hyperplasia without lower urinary tract symptoms (LUTS), - 2014 Prostate nodule w/ LUTS - 2017 Urinary Retention, Unspec, Urinary retention - 2014      PMH Notes:   1) BPH / urinary retention: He was initially seen as a hospital consultation in July 2010 for postoperative urinary retention. He was placed on tamsulosin and subsequently passed a voiding trial. His baseline symptoms included only nocturia one time per night.   Current treatment: Observation   2) Elevated PSA: His PSA was noted to be 4.4 in November 2019 by his PCP at  age 81. This was repeated and was 4.82 in December 2019. Considering his age and life expectancy, we discussed options and he elected to monitor his PSA rather than proceed with a biopsy.     NON-GU PMH: Diverticulitis Hemochromatosis, unspecified Hypothyroidism Nonrheumatic mitral (valve) prolapse    FAMILY HISTORY: 12 daughters - Runs in Family 4 sons - Runs in Family Alzheimer's Disease - Mother Breast Cancer - Mother Colon Cancer - Father Diabetes - Father father deceased at age  81 from cancer - Father mother deceased at age 81 from old age - Mother Prostate Cancer - Father   SOCIAL HISTORY: Marital Status: Married Preferred Language: English; Ethnicity: Not Hispanic Or Latino; Race: White Current Smoking Status: Patient does not smoke anymore.   Tobacco Use Assessment Completed: Used Tobacco in last 30 days? Historical Smoking Status: Patient was a heavy tobacco smoker until 09/27/2003. Drinks 1 drink per day.  Drinks 1 caffeinated drink per day. Patient's occupation is/was retired.     Notes: Previous History Of Smoking, Alcohol Use, Marital History - Currently Married   REVIEW OF SYSTEMS:    GU Review Male:   Patient denies frequent urination, hard to postpone urination, burning/ pain with urination, get up at night to urinate, leakage of urine, stream starts and stops, trouble starting your streams, and have to strain to urinate .  Gastrointestinal (Upper):   Patient denies nausea and vomiting.  Gastrointestinal (Lower):   Patient denies diarrhea and constipation.  Constitutional:   Patient denies fever, night sweats, weight loss, and fatigue.  Skin:   Patient denies skin rash/ lesion and itching.  Eyes:   Patient denies blurred vision and double vision.  Ears/ Nose/ Throat:   Patient denies sore throat and sinus problems.  Hematologic/Lymphatic:   Patient denies swollen glands and easy bruising.  Cardiovascular:   Patient denies leg swelling and chest pains.  Respiratory:   Patient denies cough and shortness of breath.  Endocrine:   Patient denies excessive thirst.  Musculoskeletal:   Patient denies back pain and joint pain.  Neurological:   Patient denies headaches and dizziness.  Psychologic:   Patient denies depression and anxiety.   VITAL SIGNS: None   MULTI-SYSTEM PHYSICAL EXAMINATION:    Constitutional: Well-nourished. No physical deformities. Normally developed. Good grooming.     Complexity of Data:  Lab Test Review:   PSA  Records Review:    Pathology Reports, Previous Patient Records  X-Ray Review: PET- PSMA Scan: Reviewed Films.     08/15/21 05/09/21 04/23/20 11/15/19 05/01/19 01/24/19 07/16/18 01/07/18  PSA  Total PSA 9.83 ng/mL 9.26 ng/mL 4.84 ng/mL 5.43 ng/mL 5.55 ng/mL 6.17 ng/mL 4.65 ng/mL 4.82 ng/mL  Free PSA 0.48 ng/mL 0.45 ng/mL 0.38 ng/mL  0.34 ng/mL 0.43 ng/mL 0.38 ng/mL 0.34 ng/mL  % Free PSA 5 % PSA 5 % PSA 8 % PSA  6 % PSA 7 % PSA 8 % PSA 7 % PSA   Notes:                     CLINICAL DATA: Prostate carcinoma. Biopsy-proven prostate carcinoma  collected on 09/06/2021. PSA equal 62.94. 81 year old male.   EXAM:  NUCLEAR MEDICINE PET SKULL BASE TO THIGH   TECHNIQUE:  9.2 mCi F18 Piflufolastat (Pylarify) was injected intravenously.  Full-ring PET imaging was performed from the skull base to thigh  after the radiotracer. CT data was obtained and used for attenuation  correction and anatomic localization.   COMPARISON: None Available.  FINDINGS:  NECK   No radiotracer activity in neck lymph nodes.   Incidental CT finding: None.   CHEST   No radiotracer accumulation within mediastinal or hilar lymph nodes.  No suspicious pulmonary nodules on the CT scan.   Incidental CT finding: Extensive centrilobular emphysema the upper  lobes. There is bilateral calcified pleural plaques.   ABDOMEN/PELVIS   Prostate: Intense focal activity posterior midline in the prostate  gland with SUV max equal 13.6 on image 201)   Lymph nodes: No abnormal radiotracer accumulation within pelvic or  abdominal nodes.   Liver: No evidence of liver metastasis.   Incidental CT finding: Several gallstones. No gallbladder  inflammation. Simple fluid attenuation cyst exophytic from the LEFT  kidney. Atherosclerotic calcification of the aorta.   SKELETON   No focal activity to suggest skeletal metastasis. No suspicious  sclerotic lesion on CT portion of   IMPRESSION:  1. Focal activity in the prostate gland consistent with  primary  prostate adenocarcinoma.  2. No evidence metastatic adenopathy in the pelvis or periaortic  retroperitoneum.  3. No evidence of visceral metastasis or skeletal metastasis.  4. Incidental findings as above including severe emphysema and  calcified pleural plaques.    Electronically Signed  By: Suzy Bouchard M.D.  On: 10/19/2021 11:07   PROCEDURES: None   ASSESSMENT:      ICD-10 Details  1 GU:   Prostate Cancer - C61    PLAN:           Document Letter(s):  Created for Patient: Clinical Summary         Notes:   1. Unfavorable intermediate risk prostate cancer: I had a detailed discussion with Mr. and Mrs. Escoto today regarding his prostate cancer diagnosis. We reviewed his PSMA PET scan which fortunately does not demonstrate evidence of metastatic disease. He has met with both Dr. Alen Blew and Dr. Tammi Klippel earlier this afternoon.   The patient was counseled about the natural history of prostate cancer and the standard treatment options that are available for prostate cancer. It was explained to him how his age and life expectancy, clinical stage, Gleason score/prognostic grade group, and PSA (and PSA density) affect his prognosis, the decision to proceed with additional staging studies, as well as how that information influences recommended treatment strategies. We discussed the roles for active surveillance, radiation therapy, surgical therapy, androgen deprivation, as well as ablative therapy and other investigational options for the treatment of prostate cancer as appropriate to his individual cancer situation. We discussed the risks and benefits of these options with regard to their impact on cancer control and also in terms of potential adverse events, complications, and impact on quality of life particularly related to urinary and sexual function. The patient was encouraged to ask questions throughout the discussion today and all questions were answered to his stated  satisfaction. In addition, the patient was provided with and/or directed to appropriate resources and literature for further education about prostate cancer and treatment options.   At this time, he is amenable to proceeding with primary radiation therapy. We discussed the potential risks and benefits of short-term androgen deprivation therapy. Considering his advanced age and his desire to avoid side effects related to androgen deprivation, he wishes to forego this. Dr. Tammi Klippel has offered him the options of both primary radiation seed implantation or external beam radiation therapy. He is going to finalize his decision and proceed accordingly this fall. We will make appropriate plans for him once we know his final  decision.   CC: Dr. Tyler Pita  Dr. Zola Button  Dr. Billey Gosling        APPENDED NOTES:    He has elected to proceed with external beam radiation therapy with IMRT. He will proceed with fiducial marker placement and SpaceOAR hydrogel insertion and subsequent 5-1/2 weeks of IMRT. His follow-up will be scheduled with me for approximately 6 to 7 months from now.    * Signed by Raynelle Bring, M.D. on 10/31/21 at 8:16 AM (EDT)*

## 2021-11-17 ENCOUNTER — Other Ambulatory Visit: Payer: Self-pay

## 2021-11-17 ENCOUNTER — Encounter (HOSPITAL_BASED_OUTPATIENT_CLINIC_OR_DEPARTMENT_OTHER): Payer: Self-pay | Admitting: Urology

## 2021-11-17 ENCOUNTER — Ambulatory Visit (HOSPITAL_BASED_OUTPATIENT_CLINIC_OR_DEPARTMENT_OTHER): Payer: PPO | Admitting: Anesthesiology

## 2021-11-17 ENCOUNTER — Ambulatory Visit (HOSPITAL_BASED_OUTPATIENT_CLINIC_OR_DEPARTMENT_OTHER)
Admission: RE | Admit: 2021-11-17 | Discharge: 2021-11-17 | Disposition: A | Discharge status: Home / Self Care | Payer: PPO | Attending: Urology | Admitting: Urology

## 2021-11-17 ENCOUNTER — Encounter (HOSPITAL_BASED_OUTPATIENT_CLINIC_OR_DEPARTMENT_OTHER)
Admission: RE | Disposition: A | Discharge status: Home / Self Care | Payer: Self-pay | Source: Home / Self Care | Attending: Urology

## 2021-11-17 DIAGNOSIS — C61 Malignant neoplasm of prostate: Secondary | ICD-10-CM

## 2021-11-17 DIAGNOSIS — I10 Essential (primary) hypertension: Secondary | ICD-10-CM | POA: Insufficient documentation

## 2021-11-17 DIAGNOSIS — Z87891 Personal history of nicotine dependence: Secondary | ICD-10-CM

## 2021-11-17 DIAGNOSIS — Z79899 Other long term (current) drug therapy: Secondary | ICD-10-CM | POA: Diagnosis not present

## 2021-11-17 DIAGNOSIS — J449 Chronic obstructive pulmonary disease, unspecified: Secondary | ICD-10-CM | POA: Insufficient documentation

## 2021-11-17 DIAGNOSIS — E039 Hypothyroidism, unspecified: Secondary | ICD-10-CM

## 2021-11-17 DIAGNOSIS — G4730 Sleep apnea, unspecified: Secondary | ICD-10-CM | POA: Diagnosis not present

## 2021-11-17 HISTORY — DX: Presence of external hearing-aid: Z97.4

## 2021-11-17 HISTORY — DX: Essential (primary) hypertension: I10

## 2021-11-17 HISTORY — DX: Cardiac murmur, unspecified: R01.1

## 2021-11-17 HISTORY — DX: Cardiac arrhythmia, unspecified: I49.9

## 2021-11-17 HISTORY — PX: SPACE OAR INSTILLATION: SHX6769

## 2021-11-17 HISTORY — PX: GOLD SEED IMPLANT: SHX6343

## 2021-11-17 LAB — POCT I-STAT, CHEM 8
BUN: 15 mg/dL (ref 8–23)
Calcium, Ion: 1.27 mmol/L (ref 1.15–1.40)
Chloride: 101 mmol/L (ref 98–111)
Creatinine, Ser: 0.8 mg/dL (ref 0.61–1.24)
Glucose, Bld: 99 mg/dL (ref 70–99)
HCT: 41 % (ref 39.0–52.0)
Hemoglobin: 13.9 g/dL (ref 13.0–17.0)
Potassium: 3.8 mmol/L (ref 3.5–5.1)
Sodium: 140 mmol/L (ref 135–145)
TCO2: 28 mmol/L (ref 22–32)

## 2021-11-17 SURGERY — INSERTION, GOLD SEEDS
Anesthesia: Monitor Anesthesia Care

## 2021-11-17 MED ORDER — KETAMINE HCL 50 MG/5ML IJ SOSY
PREFILLED_SYRINGE | INTRAMUSCULAR | Status: AC
  Filled 2021-11-17: qty 5

## 2021-11-17 MED ORDER — CEFAZOLIN SODIUM-DEXTROSE 2-4 GM/100ML-% IV SOLN
INTRAVENOUS | Status: AC
  Filled 2021-11-17: qty 100

## 2021-11-17 MED ORDER — KETAMINE HCL 10 MG/ML IJ SOLN
INTRAMUSCULAR | Status: DC | PRN
  Administered 2021-11-17: 50 mg via INTRAVENOUS

## 2021-11-17 MED ORDER — PROPOFOL 500 MG/50ML IV EMUL
INTRAVENOUS | Status: DC | PRN
  Administered 2021-11-17: 200 ug/kg/min via INTRAVENOUS

## 2021-11-17 MED ORDER — LACTATED RINGERS IV SOLN: INTRAVENOUS | Status: DC

## 2021-11-17 MED ORDER — PROPOFOL 10 MG/ML IV BOLUS
INTRAVENOUS | Status: DC | PRN
  Administered 2021-11-17: 20 mg via INTRAVENOUS
  Administered 2021-11-17: 30 mg via INTRAVENOUS

## 2021-11-17 MED ORDER — ACETAMINOPHEN 10 MG/ML IV SOLN: 1000.0000 mg | Freq: Once | INTRAVENOUS | Status: DC | PRN

## 2021-11-17 MED ORDER — ONDANSETRON HCL 4 MG/2ML IJ SOLN
INTRAMUSCULAR | Status: DC | PRN
  Administered 2021-11-17: 4 mg via INTRAVENOUS

## 2021-11-17 MED ORDER — ACETAMINOPHEN 160 MG/5ML PO SOLN: 1000.0000 mg | Freq: Once | ORAL | Status: DC | PRN

## 2021-11-17 MED ORDER — ONDANSETRON HCL 4 MG/2ML IJ SOLN
INTRAMUSCULAR | Status: AC
  Filled 2021-11-17: qty 2

## 2021-11-17 MED ORDER — PROPOFOL 500 MG/50ML IV EMUL
INTRAVENOUS | Status: AC
  Filled 2021-11-17: qty 50

## 2021-11-17 MED ORDER — SODIUM CHLORIDE (PF) 0.9 % IJ SOLN
INTRAMUSCULAR | Status: DC | PRN
  Administered 2021-11-17: 10 mL via INTRAVENOUS

## 2021-11-17 MED ORDER — OXYCODONE HCL 5 MG/5ML PO SOLN: 5.0000 mg | Freq: Once | ORAL | Status: DC | PRN

## 2021-11-17 MED ORDER — OXYCODONE HCL 5 MG PO TABS: 5.0000 mg | ORAL_TABLET | Freq: Once | ORAL | Status: DC | PRN

## 2021-11-17 MED ORDER — CEFAZOLIN SODIUM-DEXTROSE 2-4 GM/100ML-% IV SOLN
2.0000 g | INTRAVENOUS | Status: AC
  Administered 2021-11-17: 2 g via INTRAVENOUS

## 2021-11-17 MED ORDER — ACETAMINOPHEN 500 MG PO TABS: 1000.0000 mg | ORAL_TABLET | Freq: Once | ORAL | Status: DC | PRN

## 2021-11-17 MED ORDER — FENTANYL CITRATE (PF) 100 MCG/2ML IJ SOLN: 25.0000 ug | INTRAMUSCULAR | Status: DC | PRN

## 2021-11-17 SURGICAL SUPPLY — 25 items
BLADE CLIPPER SENSICLIP SURGIC (BLADE) ×2 IMPLANT
CNTNR URN SCR LID CUP LEK RST (MISCELLANEOUS) ×2 IMPLANT
CONT SPEC 4OZ STRL OR WHT (MISCELLANEOUS) ×1
COVER BACK TABLE 60X90IN (DRAPES) ×2 IMPLANT
DRSG IV TEGADERM 3.5X4.5 STRL (GAUZE/BANDAGES/DRESSINGS) IMPLANT
DRSG TEGADERM 4X4.75 (GAUZE/BANDAGES/DRESSINGS) ×2 IMPLANT
DRSG TEGADERM 8X12 (GAUZE/BANDAGES/DRESSINGS) ×2 IMPLANT
GAUZE SPONGE 4X4 12PLY STRL (GAUZE/BANDAGES/DRESSINGS) ×2 IMPLANT
GLOVE BIO SURGEON STRL SZ7.5 (GLOVE) ×2 IMPLANT
GLOVE SURG ORTHO 8.5 STRL (GLOVE) ×2 IMPLANT
IMPL SPACEOAR VUE SYSTEM (Spacer) ×2 IMPLANT
IMPLANT SPACEOAR VUE SYSTEM (Spacer) ×1 IMPLANT
KIT TURNOVER CYSTO (KITS) ×2 IMPLANT
MARKER GOLD PRELOAD 1.2X3 (Urological Implant) ×2 IMPLANT
MARKER SKIN DUAL TIP RULER LAB (MISCELLANEOUS) ×2 IMPLANT
NDL SPNL 22GX3.5 QUINCKE BK (NEEDLE) ×2 IMPLANT
NEEDLE SPNL 22GX3.5 QUINCKE BK (NEEDLE) ×1 IMPLANT
SEED GOLD PRELOAD 1.2X3 (Urological Implant) ×1 IMPLANT
SHEATH ULTRASOUND LF (SHEATH) IMPLANT
SHEATH ULTRASOUND LTX NONSTRL (SHEATH) IMPLANT
SURGILUBE 2OZ TUBE FLIPTOP (MISCELLANEOUS) ×2 IMPLANT
SYR 10ML LL (SYRINGE) ×2 IMPLANT
SYR CONTROL 10ML LL (SYRINGE) ×2 IMPLANT
TOWEL OR 17X26 10 PK STRL BLUE (TOWEL DISPOSABLE) ×2 IMPLANT
UNDERPAD 30X36 HEAVY ABSORB (UNDERPADS AND DIAPERS) ×2 IMPLANT

## 2021-11-17 NOTE — Transfer of Care (Signed)
Immediate Anesthesia Transfer of Care Note  Patient: Preston Reeves  Procedure(s) Performed: Procedure(s) (LRB): GOLD SEED IMPLANT (N/A) SPACE OAR INSTILLATION (N/A)  Patient Location: PACU  Anesthesia Type: MAC  Level of Consciousness:drowsy  Airway & Oxygen Therapy: Patient Spontanous Breathing and Patient connected to face mask oxygen  Post-op Assessment: Report given to PACU RN and Post -op Vital signs reviewed and stable  Post vital signs: Reviewed and stable  Complications: No apparent anesthesia complications  Last Vitals:  Vitals Value Taken Time  BP 124/67 11/17/21 1245  Temp    Pulse 58 11/17/21 1250  Resp 16 11/17/21 1250  SpO2 99 % 11/17/21 1250  Vitals shown include unvalidated device data.  Last Pain:  Vitals:   11/17/21 1136  TempSrc: Oral  PainSc: 0-No pain      Patients Stated Pain Goal: 5 (41/32/44 0102)  Complications: No notable events documented.

## 2021-11-17 NOTE — Anesthesia Preprocedure Evaluation (Signed)
Anesthesia Evaluation  Patient identified by MRN, date of birth, ID band Patient awake    Reviewed: Allergy & Precautions, NPO status , Patient's Chart, lab work & pertinent test results  History of Anesthesia Complications Negative for: history of anesthetic complications  Airway Mallampati: II  TM Distance: >3 FB Neck ROM: Full    Dental  (+) Dental Advisory Given   Pulmonary neg shortness of breath, asthma , neg sleep apnea, COPD,  COPD inhaler, neg recent URI, former smoker,    breath sounds clear to auscultation       Cardiovascular hypertension, Pt. on medications and Pt. on home beta blockers (-) angina(-) Past MI and (-) CHF  Rhythm:Regular  1. Left ventricular ejection fraction, by estimation, is 60 to 65%. The  left ventricle has normal function. The left ventricle has no regional  wall motion abnormalities. There is mild concentric left ventricular  hypertrophy. Left ventricular diastolic  parameters are consistent with Grade I diastolic dysfunction (impaired  relaxation).  2. The aortic valve is calcified. Aortic valve regurgitation is not  visualized. Aortic valve sclerosis/calcification is present, without any  Doppler evidence of aortic stenosis.  3. Right ventricular systolic function is normal. The right ventricular  size is normal.  4. The mitral valve is normal in structure. Trivial mitral valve  regurgitation. No evidence of mitral stenosis.  5. The inferior vena cava is normal in size with greater than 50%  respiratory variability, suggesting right atrial pressure of 3 mmHg.    Neuro/Psych negative neurological ROS  negative psych ROS   GI/Hepatic negative GI ROS, Neg liver ROS,   Endo/Other  Hypothyroidism   Renal/GU negative Renal ROS     Musculoskeletal negative musculoskeletal ROS (+)   Abdominal   Peds  Hematology negative hematology ROS (+)   Anesthesia Other Findings    Reproductive/Obstetrics                             Anesthesia Physical Anesthesia Plan  ASA: 2  Anesthesia Plan: MAC   Post-op Pain Management: Toradol IV (intra-op)*   Induction: Intravenous  PONV Risk Score and Plan: 1 and Treatment may vary due to age or medical condition and Ondansetron  Airway Management Planned: Nasal Cannula and Natural Airway  Additional Equipment: None  Intra-op Plan:   Post-operative Plan:   Informed Consent: I have reviewed the patients History and Physical, chart, labs and discussed the procedure including the risks, benefits and alternatives for the proposed anesthesia with the patient or authorized representative who has indicated his/her understanding and acceptance.     Dental advisory given  Plan Discussed with: CRNA  Anesthesia Plan Comments:         Anesthesia Quick Evaluation

## 2021-11-17 NOTE — Op Note (Signed)
Preoperative diagnosis: Prostate cancer   Postoperative diagnosis: Prostate cancer   Procedure:  1) Fiducial marker placement into the prostate 2) Insertion of SpaceOAR hydrogel    Surgeon: Jun Rightmyer S. Dakwon Wenberg, Jr. M.D.   Anesthesia: IV sedation, Local anesthesia   EBL: Minimal   Complications: None   Indication: The potential risks, complications, alternative options, and expected recovery course associated with the above procedure(s) have been discussed in detail with the patient and he has provided informed consent to proceed.   Description of procedure: The patient was administered preoperative antibiotics, placed in the dorsal lithotomy position, and prepped and draped in the usual sterile fashion. A preoperative time out was performed.  Next, transrectal ultrasonography was utilized to visualize the prostate. 10 cc of 0.25% bupivacaine was then used to infiltrate the subcuateous tissue of the perineum and an additional 10 cc was injected into the lateral apical tissue surrounding the prostate for a periprostatic nerve block.  Three gold fiducial markers were then placed into the prostate via transperineal needles under ultrasound guidance at the right apex, right base, and left mid gland under direct ultrasound guidance.  A site in the midline was then selected on the perineum for placement of an 18 g needle with saline.  The needle was advanced above the rectum and below Denonvillier's fascia to the mid gland and confirmed to be in the midline on transverse imaging.  One cc of saline was injected confirming appropriate expansion of this space.  A total of 5-10 cc of saline was then injected to open the space further bilaterally.  The saline syringe was then removed and the SpaceOAR hydrogel was injected with good distribution bilaterally. He tolerated the procedure well and without complications. He was able to be awakened and transferred to the PACU in stable condition.  

## 2021-11-17 NOTE — Discharge Instructions (Addendum)
You should avoid strenuous activities today but may resume all normal activities tomorrow.  2.   You can take Tylenol as needed for any pain or discomfort.  3.    Follow up with your radiation oncologist for your simulation appointment as scheduled.  If this is not currently scheduled or you do not know the date/time for that appointment, please contact the radiation oncology office to confirm.    Post Anesthesia Home Care Instructions  Activity: Get plenty of rest for the remainder of the day. A responsible individual must stay with you for 24 hours following the procedure.  For the next 24 hours, DO NOT: -Drive a car -Operate machinery -Drink alcoholic beverages -Take any medication unless instructed by your physician -Make any legal decisions or sign important papers.  Meals: Start with liquid foods such as gelatin or soup. Progress to regular foods as tolerated. Avoid greasy, spicy, heavy foods. If nausea and/or vomiting occur, drink only clear liquids until the nausea and/or vomiting subsides. Call your physician if vomiting continues.  Special Instructions/Symptoms: Your throat may feel dry or sore from the anesthesia or the breathing tube placed in your throat during surgery. If this causes discomfort, gargle with warm salt water. The discomfort should disappear within 24 hours.  

## 2021-11-17 NOTE — Interval H&P Note (Signed)
History and Physical Interval Note:  11/17/2021 11:25 AM  Preston Reeves  has presented today for surgery, with the diagnosis of PROSTATE CANCER.  The various methods of treatment have been discussed with the patient and family. After consideration of risks, benefits and other options for treatment, the patient has consented to  Procedure(s) with comments: GOLD SEED IMPLANT (N/A) - 15 MINUTES NEEDED FOR CASE SPACE OAR INSTILLATION (N/A) as a surgical intervention.  The patient's history has been reviewed, patient examined, no change in status, stable for surgery.  I have reviewed the patient's chart and labs.  Questions were answered to the patient's satisfaction.     [employer]

## 2021-11-17 NOTE — Anesthesia Procedure Notes (Signed)
Procedure Name: MAC Date/Time: 11/17/2021 12:15 PM  Performed by: Mechele Claude, CRNAPre-anesthesia Checklist: Patient identified, Emergency Drugs available, Suction available and Patient being monitored Oxygen Delivery Method: Simple face mask Placement Confirmation: positive ETCO2 and breath sounds checked- equal and bilateral

## 2021-11-18 ENCOUNTER — Telehealth: Payer: Self-pay | Admitting: *Deleted

## 2021-11-18 ENCOUNTER — Encounter (HOSPITAL_BASED_OUTPATIENT_CLINIC_OR_DEPARTMENT_OTHER): Payer: Self-pay | Admitting: Urology

## 2021-11-18 DIAGNOSIS — C61 Malignant neoplasm of prostate: Secondary | ICD-10-CM | POA: Diagnosis not present

## 2021-11-18 DIAGNOSIS — Z191 Hormone sensitive malignancy status: Secondary | ICD-10-CM | POA: Diagnosis not present

## 2021-11-18 NOTE — Telephone Encounter (Signed)
CALLED PATIENT TO REMIND OF SIM APPT. FOR 11-20-21- ARRIVAL TIME- 8:45 AM @ [facility], INFORMED PATIENT TO ARRIVE WITH A FULL BLADDER, SPOKE WITH PATIENT AND HE IS AWARE OF THIS APPT. AND THE INSTRUCTIONS

## 2021-11-18 NOTE — Progress Notes (Signed)
  Radiation Oncology         [phone]) [phone] ________________________________  Name: Preston Reeves MRN: [id number]  Date: 11/20/2021  DOB: 1940-02-28  SIMULATION AND TREATMENT PLANNING NOTE    ICD-10-CM   1. Malignant neoplasm of prostate ([place])  C61       DIAGNOSIS:  81 y.o. gentleman with stage T1c adenocarcinoma of the prostate with a Gleason's score of 4+3 and a PSA of 9.83  NARRATIVE:  The patient was brought to the [facility].  Identity was confirmed.  All relevant records and images related to the planned course of therapy were reviewed.  The patient freely provided informed written consent to proceed with treatment after reviewing the details related to the planned course of therapy. The consent form was witnessed and verified by the simulation staff.  Then, the patient was set-up in a stable reproducible supine position for radiation therapy.  A vacuum lock pillow device was custom fabricated to position his legs in a reproducible immobilized position.  Then, supervised the performance of a urethrogram under sterile conditions to identify the prostatic apex.  CT images were obtained.  Surface markings were placed.  The CT images were loaded into the planning software.  Then the prostate target and avoidance structures including the rectum, bladder, bowel and hips were contoured.  Treatment planning then occurred.  The radiation prescription was entered and confirmed.  A total of one complex treatment devices was fabricated. I have requested : Intensity Modulated Radiotherapy (IMRT) is medically necessary for this case for the following reason:  Rectal sparing.  I have requested daily cone beam CT volumetric image gudiance to track gold fiducial posiitoning along with bladder and rectal filling, this is medically necessary to assure accurate positioning of high dose radiation.  PLAN:  The patient will receive 70 Gy in 28 fractions.  ________________________________  Sheral Apley Tammi Klippel, M.D.

## 2021-11-19 NOTE — Anesthesia Postprocedure Evaluation (Signed)
Anesthesia Post Note  Patient: Preston Reeves  Procedure(s) Performed: GOLD SEED IMPLANT SPACE OAR INSTILLATION     Patient location during evaluation: PACU Anesthesia Type: MAC Level of consciousness: awake and alert Pain management: pain level controlled Vital Signs Assessment: post-procedure vital signs reviewed and stable Respiratory status: spontaneous breathing, nonlabored ventilation and respiratory function stable Cardiovascular status: stable and blood pressure returned to baseline Postop Assessment: no apparent nausea or vomiting Anesthetic complications: no   No notable events documented.  Last Vitals:  Vitals:   11/17/21 1504 11/17/21 1540  BP: 139/67 134/68  Pulse: 61 68  Resp: 15 14  Temp: (!) 36.3 C 36.4 C  SpO2: 97% 98%    Last Pain:  Vitals:   11/18/21 1026  TempSrc:   PainSc: 0-No pain                 Rozalynn Buege

## 2021-11-20 ENCOUNTER — Ambulatory Visit
Admission: RE | Admit: 2021-11-20 | Discharge: 2021-11-20 | Disposition: A | Discharge status: Home / Self Care | Payer: PPO | Source: Ambulatory Visit | Attending: Radiation Oncology | Admitting: Radiation Oncology

## 2021-11-20 DIAGNOSIS — C61 Malignant neoplasm of prostate: Secondary | ICD-10-CM | POA: Insufficient documentation

## 2021-11-20 DIAGNOSIS — Z191 Hormone sensitive malignancy status: Secondary | ICD-10-CM | POA: Diagnosis not present

## 2021-11-27 ENCOUNTER — Other Ambulatory Visit: Payer: Self-pay | Admitting: Orthopaedic Surgery

## 2021-11-27 DIAGNOSIS — Z191 Hormone sensitive malignancy status: Secondary | ICD-10-CM | POA: Diagnosis not present

## 2021-11-27 DIAGNOSIS — C61 Malignant neoplasm of prostate: Secondary | ICD-10-CM | POA: Diagnosis not present

## 2021-11-27 DIAGNOSIS — G5601 Carpal tunnel syndrome, right upper limb: Secondary | ICD-10-CM | POA: Diagnosis not present

## 2021-11-27 MED ORDER — HYDROCODONE-ACETAMINOPHEN 5-325 MG PO TABS: 1.0000 | ORAL_TABLET | Freq: Four times a day (QID) | ORAL | 0 refills | Status: DC | PRN

## 2021-11-28 DIAGNOSIS — C61 Malignant neoplasm of prostate: Secondary | ICD-10-CM | POA: Insufficient documentation

## 2021-11-28 DIAGNOSIS — Z191 Hormone sensitive malignancy status: Secondary | ICD-10-CM | POA: Diagnosis not present

## 2021-12-01 ENCOUNTER — Ambulatory Visit
Admission: RE | Admit: 2021-12-01 | Discharge: 2021-12-01 | Disposition: A | Discharge status: Home / Self Care | Payer: PPO | Source: Ambulatory Visit | Attending: Radiation Oncology | Admitting: Radiation Oncology

## 2021-12-01 ENCOUNTER — Other Ambulatory Visit: Payer: Self-pay

## 2021-12-01 DIAGNOSIS — C61 Malignant neoplasm of prostate: Secondary | ICD-10-CM | POA: Diagnosis not present

## 2021-12-01 DIAGNOSIS — Z510 Encounter for antineoplastic radiation therapy: Secondary | ICD-10-CM | POA: Diagnosis not present

## 2021-12-01 DIAGNOSIS — Z191 Hormone sensitive malignancy status: Secondary | ICD-10-CM | POA: Diagnosis not present

## 2021-12-01 LAB — RAD ONC ARIA SESSION SUMMARY
Course Elapsed Days: 0
Plan Fractions Treated to Date: 1
Plan Prescribed Dose Per Fraction: 2.5 Gy
Plan Total Fractions Prescribed: 28
Plan Total Prescribed Dose: 70 Gy
Reference Point Dosage Given to Date: 2.5 Gy
Reference Point Session Dosage Given: 2.5 Gy
Session Number: 1

## 2021-12-02 ENCOUNTER — Other Ambulatory Visit: Payer: Self-pay

## 2021-12-02 ENCOUNTER — Ambulatory Visit
Admission: RE | Admit: 2021-12-02 | Discharge: 2021-12-02 | Disposition: A | Discharge status: Home / Self Care | Payer: PPO | Source: Ambulatory Visit | Attending: Radiation Oncology | Admitting: Radiation Oncology

## 2021-12-02 DIAGNOSIS — Z510 Encounter for antineoplastic radiation therapy: Secondary | ICD-10-CM | POA: Diagnosis not present

## 2021-12-02 DIAGNOSIS — C61 Malignant neoplasm of prostate: Secondary | ICD-10-CM | POA: Diagnosis not present

## 2021-12-02 DIAGNOSIS — Z191 Hormone sensitive malignancy status: Secondary | ICD-10-CM | POA: Diagnosis not present

## 2021-12-02 LAB — RAD ONC ARIA SESSION SUMMARY
Course Elapsed Days: 1
Plan Fractions Treated to Date: 2
Plan Prescribed Dose Per Fraction: 2.5 Gy
Plan Total Fractions Prescribed: 28
Plan Total Prescribed Dose: 70 Gy
Reference Point Dosage Given to Date: 5 Gy
Reference Point Session Dosage Given: 2.5 Gy
Session Number: 2

## 2021-12-03 ENCOUNTER — Other Ambulatory Visit: Payer: Self-pay

## 2021-12-03 ENCOUNTER — Ambulatory Visit
Admission: RE | Admit: 2021-12-03 | Discharge: 2021-12-03 | Disposition: A | Discharge status: Home / Self Care | Payer: PPO | Source: Ambulatory Visit | Attending: Radiation Oncology | Admitting: Radiation Oncology

## 2021-12-03 DIAGNOSIS — Z510 Encounter for antineoplastic radiation therapy: Secondary | ICD-10-CM | POA: Diagnosis not present

## 2021-12-03 DIAGNOSIS — C61 Malignant neoplasm of prostate: Secondary | ICD-10-CM | POA: Diagnosis not present

## 2021-12-03 DIAGNOSIS — Z191 Hormone sensitive malignancy status: Secondary | ICD-10-CM | POA: Diagnosis not present

## 2021-12-03 LAB — RAD ONC ARIA SESSION SUMMARY
Course Elapsed Days: 2
Plan Fractions Treated to Date: 3
Plan Prescribed Dose Per Fraction: 2.5 Gy
Plan Total Fractions Prescribed: 28
Plan Total Prescribed Dose: 70 Gy
Reference Point Dosage Given to Date: 7.5 Gy
Reference Point Session Dosage Given: 2.5 Gy
Session Number: 3

## 2021-12-04 ENCOUNTER — Other Ambulatory Visit: Payer: Self-pay

## 2021-12-04 ENCOUNTER — Ambulatory Visit
Admission: RE | Admit: 2021-12-04 | Discharge: 2021-12-04 | Disposition: A | Discharge status: Home / Self Care | Payer: PPO | Source: Ambulatory Visit | Attending: Radiation Oncology | Admitting: Radiation Oncology

## 2021-12-04 DIAGNOSIS — Z510 Encounter for antineoplastic radiation therapy: Secondary | ICD-10-CM | POA: Diagnosis not present

## 2021-12-04 DIAGNOSIS — Z191 Hormone sensitive malignancy status: Secondary | ICD-10-CM | POA: Diagnosis not present

## 2021-12-04 DIAGNOSIS — C61 Malignant neoplasm of prostate: Secondary | ICD-10-CM | POA: Diagnosis not present

## 2021-12-04 LAB — RAD ONC ARIA SESSION SUMMARY
Course Elapsed Days: 3
Plan Fractions Treated to Date: 4
Plan Prescribed Dose Per Fraction: 2.5 Gy
Plan Total Fractions Prescribed: 28
Plan Total Prescribed Dose: 70 Gy
Reference Point Dosage Given to Date: 10 Gy
Reference Point Session Dosage Given: 2.5 Gy
Session Number: 4

## 2021-12-05 ENCOUNTER — Ambulatory Visit
Admission: RE | Admit: 2021-12-05 | Discharge: 2021-12-05 | Disposition: A | Discharge status: Home / Self Care | Payer: PPO | Source: Ambulatory Visit | Attending: Radiation Oncology | Admitting: Radiation Oncology

## 2021-12-05 ENCOUNTER — Other Ambulatory Visit: Payer: Self-pay

## 2021-12-05 DIAGNOSIS — Z510 Encounter for antineoplastic radiation therapy: Secondary | ICD-10-CM | POA: Diagnosis not present

## 2021-12-05 DIAGNOSIS — C61 Malignant neoplasm of prostate: Secondary | ICD-10-CM | POA: Diagnosis not present

## 2021-12-05 DIAGNOSIS — Z191 Hormone sensitive malignancy status: Secondary | ICD-10-CM | POA: Diagnosis not present

## 2021-12-05 LAB — RAD ONC ARIA SESSION SUMMARY
Course Elapsed Days: 4
Plan Fractions Treated to Date: 5
Plan Prescribed Dose Per Fraction: 2.5 Gy
Plan Total Fractions Prescribed: 28
Plan Total Prescribed Dose: 70 Gy
Reference Point Dosage Given to Date: 12.5 Gy
Reference Point Session Dosage Given: 2.5 Gy
Session Number: 5

## 2021-12-08 ENCOUNTER — Other Ambulatory Visit: Payer: Self-pay

## 2021-12-08 ENCOUNTER — Ambulatory Visit
Admission: RE | Admit: 2021-12-08 | Discharge: 2021-12-08 | Disposition: A | Discharge status: Home / Self Care | Payer: PPO | Source: Ambulatory Visit | Attending: Radiation Oncology | Admitting: Radiation Oncology

## 2021-12-08 DIAGNOSIS — C61 Malignant neoplasm of prostate: Secondary | ICD-10-CM | POA: Diagnosis not present

## 2021-12-08 DIAGNOSIS — Z191 Hormone sensitive malignancy status: Secondary | ICD-10-CM | POA: Diagnosis not present

## 2021-12-08 DIAGNOSIS — Z510 Encounter for antineoplastic radiation therapy: Secondary | ICD-10-CM | POA: Diagnosis not present

## 2021-12-08 LAB — RAD ONC ARIA SESSION SUMMARY
Course Elapsed Days: 7
Plan Fractions Treated to Date: 6
Plan Prescribed Dose Per Fraction: 2.5 Gy
Plan Total Fractions Prescribed: 28
Plan Total Prescribed Dose: 70 Gy
Reference Point Dosage Given to Date: 15 Gy
Reference Point Session Dosage Given: 2.5 Gy
Session Number: 6

## 2021-12-09 ENCOUNTER — Other Ambulatory Visit: Payer: Self-pay

## 2021-12-09 ENCOUNTER — Ambulatory Visit
Admission: RE | Admit: 2021-12-09 | Discharge: 2021-12-09 | Disposition: A | Discharge status: Home / Self Care | Payer: PPO | Source: Ambulatory Visit | Attending: Radiation Oncology | Admitting: Radiation Oncology

## 2021-12-09 DIAGNOSIS — Z510 Encounter for antineoplastic radiation therapy: Secondary | ICD-10-CM | POA: Diagnosis not present

## 2021-12-09 DIAGNOSIS — Z191 Hormone sensitive malignancy status: Secondary | ICD-10-CM | POA: Diagnosis not present

## 2021-12-09 DIAGNOSIS — C61 Malignant neoplasm of prostate: Secondary | ICD-10-CM | POA: Diagnosis not present

## 2021-12-09 LAB — RAD ONC ARIA SESSION SUMMARY
Course Elapsed Days: 8
Plan Fractions Treated to Date: 7
Plan Prescribed Dose Per Fraction: 2.5 Gy
Plan Total Fractions Prescribed: 28
Plan Total Prescribed Dose: 70 Gy
Reference Point Dosage Given to Date: 17.5 Gy
Reference Point Session Dosage Given: 2.5 Gy
Session Number: 7

## 2021-12-10 ENCOUNTER — Other Ambulatory Visit: Payer: Self-pay

## 2021-12-10 ENCOUNTER — Ambulatory Visit
Admission: RE | Admit: 2021-12-10 | Discharge: 2021-12-10 | Disposition: A | Discharge status: Home / Self Care | Payer: PPO | Source: Ambulatory Visit | Attending: Radiation Oncology | Admitting: Radiation Oncology

## 2021-12-10 DIAGNOSIS — C61 Malignant neoplasm of prostate: Secondary | ICD-10-CM | POA: Diagnosis not present

## 2021-12-10 DIAGNOSIS — Z510 Encounter for antineoplastic radiation therapy: Secondary | ICD-10-CM | POA: Diagnosis not present

## 2021-12-10 DIAGNOSIS — Z191 Hormone sensitive malignancy status: Secondary | ICD-10-CM | POA: Diagnosis not present

## 2021-12-10 LAB — RAD ONC ARIA SESSION SUMMARY
Course Elapsed Days: 9
Plan Fractions Treated to Date: 8
Plan Prescribed Dose Per Fraction: 2.5 Gy
Plan Total Fractions Prescribed: 28
Plan Total Prescribed Dose: 70 Gy
Reference Point Dosage Given to Date: 20 Gy
Reference Point Session Dosage Given: 2.5 Gy
Session Number: 8

## 2021-12-11 ENCOUNTER — Ambulatory Visit (INDEPENDENT_AMBULATORY_CARE_PROVIDER_SITE_OTHER): Payer: PPO | Admitting: Orthopaedic Surgery

## 2021-12-11 ENCOUNTER — Other Ambulatory Visit: Payer: Self-pay

## 2021-12-11 ENCOUNTER — Encounter: Payer: Self-pay | Admitting: Orthopaedic Surgery

## 2021-12-11 ENCOUNTER — Ambulatory Visit
Admission: RE | Admit: 2021-12-11 | Discharge: 2021-12-11 | Disposition: A | Discharge status: Home / Self Care | Payer: PPO | Source: Ambulatory Visit | Attending: Radiation Oncology | Admitting: Radiation Oncology

## 2021-12-11 DIAGNOSIS — Z510 Encounter for antineoplastic radiation therapy: Secondary | ICD-10-CM | POA: Diagnosis not present

## 2021-12-11 DIAGNOSIS — C61 Malignant neoplasm of prostate: Secondary | ICD-10-CM | POA: Diagnosis not present

## 2021-12-11 DIAGNOSIS — Z9889 Other specified postprocedural states: Secondary | ICD-10-CM

## 2021-12-11 DIAGNOSIS — Z191 Hormone sensitive malignancy status: Secondary | ICD-10-CM | POA: Diagnosis not present

## 2021-12-11 LAB — RAD ONC ARIA SESSION SUMMARY
Course Elapsed Days: 10
Plan Fractions Treated to Date: 9
Plan Prescribed Dose Per Fraction: 2.5 Gy
Plan Total Fractions Prescribed: 28
Plan Total Prescribed Dose: 70 Gy
Reference Point Dosage Given to Date: 22.5 Gy
Reference Point Session Dosage Given: 2.5 Gy
Session Number: 9

## 2021-12-11 NOTE — Progress Notes (Signed)
The patient is a 81-year-old gentleman who is 2 weeks status post a right open carpal tunnel release.  His carpal tunnel syndrome is quite severe.  He is doing well overall.  His fingers are well-perfused.  He has weak pinch and grip strength in the expected but it is improving some.  The sutures been removed and Steri-Strips applied.  Overall things look appropriate.  There is no evidence of infection.  He can start using his hand as comfort allows.  He will leave the dressing on for only 1 to 2 days and then he can get the actual Steri-Strips wet.  We will see him back in a month to see how is doing overall.

## 2021-12-12 ENCOUNTER — Other Ambulatory Visit: Payer: Self-pay

## 2021-12-12 ENCOUNTER — Ambulatory Visit
Admission: RE | Admit: 2021-12-12 | Discharge: 2021-12-12 | Disposition: A | Discharge status: Home / Self Care | Payer: PPO | Source: Ambulatory Visit | Attending: Radiation Oncology | Admitting: Radiation Oncology

## 2021-12-12 DIAGNOSIS — C61 Malignant neoplasm of prostate: Secondary | ICD-10-CM | POA: Diagnosis not present

## 2021-12-12 DIAGNOSIS — Z510 Encounter for antineoplastic radiation therapy: Secondary | ICD-10-CM | POA: Diagnosis not present

## 2021-12-12 DIAGNOSIS — Z191 Hormone sensitive malignancy status: Secondary | ICD-10-CM | POA: Diagnosis not present

## 2021-12-12 LAB — RAD ONC ARIA SESSION SUMMARY
Course Elapsed Days: 11
Plan Fractions Treated to Date: 10
Plan Prescribed Dose Per Fraction: 2.5 Gy
Plan Total Fractions Prescribed: 28
Plan Total Prescribed Dose: 70 Gy
Reference Point Dosage Given to Date: 25 Gy
Reference Point Session Dosage Given: 2.5 Gy
Session Number: 10

## 2021-12-15 ENCOUNTER — Other Ambulatory Visit: Payer: Self-pay

## 2021-12-15 ENCOUNTER — Ambulatory Visit
Admission: RE | Admit: 2021-12-15 | Discharge: 2021-12-15 | Disposition: A | Discharge status: Home / Self Care | Payer: PPO | Source: Ambulatory Visit | Attending: Radiation Oncology | Admitting: Radiation Oncology

## 2021-12-15 DIAGNOSIS — C61 Malignant neoplasm of prostate: Secondary | ICD-10-CM | POA: Diagnosis not present

## 2021-12-15 DIAGNOSIS — Z510 Encounter for antineoplastic radiation therapy: Secondary | ICD-10-CM | POA: Diagnosis not present

## 2021-12-15 DIAGNOSIS — Z191 Hormone sensitive malignancy status: Secondary | ICD-10-CM | POA: Diagnosis not present

## 2021-12-15 LAB — RAD ONC ARIA SESSION SUMMARY
Course Elapsed Days: 14
Plan Fractions Treated to Date: 11
Plan Prescribed Dose Per Fraction: 2.5 Gy
Plan Total Fractions Prescribed: 28
Plan Total Prescribed Dose: 70 Gy
Reference Point Dosage Given to Date: 27.5 Gy
Reference Point Session Dosage Given: 2.5 Gy
Session Number: 11

## 2021-12-16 ENCOUNTER — Ambulatory Visit
Admission: RE | Admit: 2021-12-16 | Discharge: 2021-12-16 | Disposition: A | Discharge status: Home / Self Care | Payer: PPO | Source: Ambulatory Visit | Attending: Radiation Oncology | Admitting: Radiation Oncology

## 2021-12-16 ENCOUNTER — Other Ambulatory Visit: Payer: Self-pay

## 2021-12-16 DIAGNOSIS — C61 Malignant neoplasm of prostate: Secondary | ICD-10-CM | POA: Diagnosis not present

## 2021-12-16 DIAGNOSIS — Z191 Hormone sensitive malignancy status: Secondary | ICD-10-CM | POA: Diagnosis not present

## 2021-12-16 DIAGNOSIS — Z510 Encounter for antineoplastic radiation therapy: Secondary | ICD-10-CM | POA: Diagnosis not present

## 2021-12-16 LAB — RAD ONC ARIA SESSION SUMMARY
Course Elapsed Days: 15
Plan Fractions Treated to Date: 12
Plan Prescribed Dose Per Fraction: 2.5 Gy
Plan Total Fractions Prescribed: 28
Plan Total Prescribed Dose: 70 Gy
Reference Point Dosage Given to Date: 30 Gy
Reference Point Session Dosage Given: 2.5 Gy
Session Number: 12

## 2021-12-17 ENCOUNTER — Ambulatory Visit
Admission: RE | Admit: 2021-12-17 | Discharge: 2021-12-17 | Disposition: A | Discharge status: Home / Self Care | Payer: PPO | Source: Ambulatory Visit | Attending: Radiation Oncology | Admitting: Radiation Oncology

## 2021-12-17 ENCOUNTER — Other Ambulatory Visit: Payer: Self-pay

## 2021-12-17 ENCOUNTER — Other Ambulatory Visit: Payer: Self-pay | Admitting: Internal Medicine

## 2021-12-17 ENCOUNTER — Ambulatory Visit: Payer: PPO

## 2021-12-17 DIAGNOSIS — Z191 Hormone sensitive malignancy status: Secondary | ICD-10-CM | POA: Diagnosis not present

## 2021-12-17 DIAGNOSIS — C61 Malignant neoplasm of prostate: Secondary | ICD-10-CM | POA: Diagnosis not present

## 2021-12-17 DIAGNOSIS — Z510 Encounter for antineoplastic radiation therapy: Secondary | ICD-10-CM | POA: Diagnosis not present

## 2021-12-17 LAB — RAD ONC ARIA SESSION SUMMARY
Course Elapsed Days: 16
Plan Fractions Treated to Date: 13
Plan Prescribed Dose Per Fraction: 2.5 Gy
Plan Total Fractions Prescribed: 28
Plan Total Prescribed Dose: 70 Gy
Reference Point Dosage Given to Date: 32.5 Gy
Reference Point Session Dosage Given: 2.5 Gy
Session Number: 13

## 2021-12-22 ENCOUNTER — Other Ambulatory Visit: Payer: Self-pay

## 2021-12-22 ENCOUNTER — Ambulatory Visit
Admission: RE | Admit: 2021-12-22 | Discharge: 2021-12-22 | Disposition: A | Discharge status: Home / Self Care | Payer: PPO | Source: Ambulatory Visit | Attending: Radiation Oncology | Admitting: Radiation Oncology

## 2021-12-22 DIAGNOSIS — C61 Malignant neoplasm of prostate: Secondary | ICD-10-CM | POA: Diagnosis not present

## 2021-12-22 DIAGNOSIS — Z510 Encounter for antineoplastic radiation therapy: Secondary | ICD-10-CM | POA: Diagnosis not present

## 2021-12-22 DIAGNOSIS — Z191 Hormone sensitive malignancy status: Secondary | ICD-10-CM | POA: Diagnosis not present

## 2021-12-22 LAB — RAD ONC ARIA SESSION SUMMARY
Course Elapsed Days: 21
Plan Fractions Treated to Date: 14
Plan Prescribed Dose Per Fraction: 2.5 Gy
Plan Total Fractions Prescribed: 28
Plan Total Prescribed Dose: 70 Gy
Reference Point Dosage Given to Date: 35 Gy
Reference Point Session Dosage Given: 2.5 Gy
Session Number: 14

## 2021-12-23 ENCOUNTER — Other Ambulatory Visit: Payer: Self-pay

## 2021-12-23 ENCOUNTER — Ambulatory Visit
Admission: RE | Admit: 2021-12-23 | Discharge: 2021-12-23 | Disposition: A | Discharge status: Home / Self Care | Payer: PPO | Source: Ambulatory Visit | Attending: Radiation Oncology | Admitting: Radiation Oncology

## 2021-12-23 DIAGNOSIS — C61 Malignant neoplasm of prostate: Secondary | ICD-10-CM | POA: Diagnosis not present

## 2021-12-23 DIAGNOSIS — Z510 Encounter for antineoplastic radiation therapy: Secondary | ICD-10-CM | POA: Diagnosis not present

## 2021-12-23 DIAGNOSIS — Z191 Hormone sensitive malignancy status: Secondary | ICD-10-CM | POA: Diagnosis not present

## 2021-12-23 LAB — RAD ONC ARIA SESSION SUMMARY
Course Elapsed Days: 22
Plan Fractions Treated to Date: 15
Plan Prescribed Dose Per Fraction: 2.5 Gy
Plan Total Fractions Prescribed: 28
Plan Total Prescribed Dose: 70 Gy
Reference Point Dosage Given to Date: 37.5 Gy
Reference Point Session Dosage Given: 2.5 Gy
Session Number: 15

## 2021-12-24 ENCOUNTER — Ambulatory Visit
Admission: RE | Admit: 2021-12-24 | Discharge: 2021-12-24 | Disposition: A | Discharge status: Home / Self Care | Payer: PPO | Source: Ambulatory Visit | Attending: Radiation Oncology | Admitting: Radiation Oncology

## 2021-12-24 ENCOUNTER — Other Ambulatory Visit: Payer: Self-pay

## 2021-12-24 DIAGNOSIS — Z510 Encounter for antineoplastic radiation therapy: Secondary | ICD-10-CM | POA: Diagnosis not present

## 2021-12-24 DIAGNOSIS — C61 Malignant neoplasm of prostate: Secondary | ICD-10-CM | POA: Diagnosis not present

## 2021-12-24 DIAGNOSIS — Z191 Hormone sensitive malignancy status: Secondary | ICD-10-CM | POA: Diagnosis not present

## 2021-12-24 LAB — RAD ONC ARIA SESSION SUMMARY
Course Elapsed Days: 23
Plan Fractions Treated to Date: 16
Plan Prescribed Dose Per Fraction: 2.5 Gy
Plan Total Fractions Prescribed: 28
Plan Total Prescribed Dose: 70 Gy
Reference Point Dosage Given to Date: 40 Gy
Reference Point Session Dosage Given: 2.5 Gy
Session Number: 16

## 2021-12-25 ENCOUNTER — Other Ambulatory Visit: Payer: Self-pay

## 2021-12-25 ENCOUNTER — Ambulatory Visit
Admission: RE | Admit: 2021-12-25 | Discharge: 2021-12-25 | Disposition: A | Discharge status: Home / Self Care | Payer: PPO | Source: Ambulatory Visit | Attending: Radiation Oncology | Admitting: Radiation Oncology

## 2021-12-25 DIAGNOSIS — Z510 Encounter for antineoplastic radiation therapy: Secondary | ICD-10-CM | POA: Diagnosis not present

## 2021-12-25 DIAGNOSIS — Z191 Hormone sensitive malignancy status: Secondary | ICD-10-CM | POA: Diagnosis not present

## 2021-12-25 DIAGNOSIS — C61 Malignant neoplasm of prostate: Secondary | ICD-10-CM | POA: Diagnosis not present

## 2021-12-25 LAB — RAD ONC ARIA SESSION SUMMARY
Course Elapsed Days: 24
Plan Fractions Treated to Date: 17
Plan Prescribed Dose Per Fraction: 2.5 Gy
Plan Total Fractions Prescribed: 28
Plan Total Prescribed Dose: 70 Gy
Reference Point Dosage Given to Date: 42.5 Gy
Reference Point Session Dosage Given: 2.5 Gy
Session Number: 17

## 2021-12-26 ENCOUNTER — Other Ambulatory Visit: Payer: Self-pay

## 2021-12-26 ENCOUNTER — Ambulatory Visit
Admission: RE | Admit: 2021-12-26 | Discharge: 2021-12-26 | Disposition: A | Discharge status: Home / Self Care | Payer: PPO | Source: Ambulatory Visit | Attending: Radiation Oncology | Admitting: Radiation Oncology

## 2021-12-26 DIAGNOSIS — C61 Malignant neoplasm of prostate: Secondary | ICD-10-CM | POA: Insufficient documentation

## 2021-12-26 DIAGNOSIS — Z510 Encounter for antineoplastic radiation therapy: Secondary | ICD-10-CM | POA: Diagnosis not present

## 2021-12-26 DIAGNOSIS — Z191 Hormone sensitive malignancy status: Secondary | ICD-10-CM | POA: Diagnosis not present

## 2021-12-26 LAB — RAD ONC ARIA SESSION SUMMARY
Course Elapsed Days: 25
Plan Fractions Treated to Date: 18
Plan Prescribed Dose Per Fraction: 2.5 Gy
Plan Total Fractions Prescribed: 28
Plan Total Prescribed Dose: 70 Gy
Reference Point Dosage Given to Date: 45 Gy
Reference Point Session Dosage Given: 2.5 Gy
Session Number: 18

## 2021-12-29 ENCOUNTER — Other Ambulatory Visit: Payer: Self-pay

## 2021-12-29 ENCOUNTER — Ambulatory Visit
Admission: RE | Admit: 2021-12-29 | Discharge: 2021-12-29 | Disposition: A | Discharge status: Home / Self Care | Payer: PPO | Source: Ambulatory Visit | Attending: Radiation Oncology | Admitting: Radiation Oncology

## 2021-12-29 DIAGNOSIS — Z191 Hormone sensitive malignancy status: Secondary | ICD-10-CM | POA: Diagnosis not present

## 2021-12-29 DIAGNOSIS — Z510 Encounter for antineoplastic radiation therapy: Secondary | ICD-10-CM | POA: Diagnosis not present

## 2021-12-29 DIAGNOSIS — C61 Malignant neoplasm of prostate: Secondary | ICD-10-CM | POA: Diagnosis not present

## 2021-12-29 LAB — RAD ONC ARIA SESSION SUMMARY
Course Elapsed Days: 28
Plan Fractions Treated to Date: 19
Plan Prescribed Dose Per Fraction: 2.5 Gy
Plan Total Fractions Prescribed: 28
Plan Total Prescribed Dose: 70 Gy
Reference Point Dosage Given to Date: 47.5 Gy
Reference Point Session Dosage Given: 2.5 Gy
Session Number: 19

## 2021-12-30 ENCOUNTER — Ambulatory Visit
Admission: RE | Admit: 2021-12-30 | Discharge: 2021-12-30 | Disposition: A | Discharge status: Home / Self Care | Payer: PPO | Source: Ambulatory Visit | Attending: Radiation Oncology | Admitting: Radiation Oncology

## 2021-12-30 ENCOUNTER — Other Ambulatory Visit: Payer: Self-pay

## 2021-12-30 DIAGNOSIS — Z510 Encounter for antineoplastic radiation therapy: Secondary | ICD-10-CM | POA: Diagnosis not present

## 2021-12-30 DIAGNOSIS — C61 Malignant neoplasm of prostate: Secondary | ICD-10-CM | POA: Diagnosis not present

## 2021-12-30 DIAGNOSIS — Z191 Hormone sensitive malignancy status: Secondary | ICD-10-CM | POA: Diagnosis not present

## 2021-12-30 LAB — RAD ONC ARIA SESSION SUMMARY
Course Elapsed Days: 29
Plan Fractions Treated to Date: 20
Plan Prescribed Dose Per Fraction: 2.5 Gy
Plan Total Fractions Prescribed: 28
Plan Total Prescribed Dose: 70 Gy
Reference Point Dosage Given to Date: 50 Gy
Reference Point Session Dosage Given: 2.5 Gy
Session Number: 20

## 2021-12-31 ENCOUNTER — Telehealth: Payer: Self-pay | Admitting: *Deleted

## 2021-12-31 ENCOUNTER — Other Ambulatory Visit: Payer: Self-pay

## 2021-12-31 ENCOUNTER — Ambulatory Visit
Admission: RE | Admit: 2021-12-31 | Discharge: 2021-12-31 | Disposition: A | Discharge status: Home / Self Care | Payer: PPO | Source: Ambulatory Visit | Attending: Radiation Oncology | Admitting: Radiation Oncology

## 2021-12-31 DIAGNOSIS — I447 Left bundle-branch block, unspecified: Secondary | ICD-10-CM

## 2021-12-31 DIAGNOSIS — Z191 Hormone sensitive malignancy status: Secondary | ICD-10-CM | POA: Diagnosis not present

## 2021-12-31 DIAGNOSIS — Z510 Encounter for antineoplastic radiation therapy: Secondary | ICD-10-CM | POA: Diagnosis not present

## 2021-12-31 DIAGNOSIS — C61 Malignant neoplasm of prostate: Secondary | ICD-10-CM | POA: Diagnosis not present

## 2021-12-31 LAB — RAD ONC ARIA SESSION SUMMARY
Course Elapsed Days: 30
Plan Fractions Treated to Date: 21
Plan Prescribed Dose Per Fraction: 2.5 Gy
Plan Total Fractions Prescribed: 28
Plan Total Prescribed Dose: 70 Gy
Reference Point Dosage Given to Date: 52.5 Gy
Reference Point Session Dosage Given: 2.5 Gy
Session Number: 21

## 2021-12-31 NOTE — Telephone Encounter (Signed)
-----   Message from Earnestine Mealing sent at 12/31/2021 10:10 AM EST ----- Regarding: Echo order Good morning,  When you get a moment, will you please put in another echo order for this patient.  I scheduled his echo/ov on may 13th but the echo order expires in April.   Thank you

## 2021-12-31 NOTE — Telephone Encounter (Signed)
[facility] Scheduling requested that I enter a new order for this pts echo he will be having done in Jun 08, 2022, for current echo order expired.  New echo order placed and scheduling aware of this.

## 2022-01-01 ENCOUNTER — Other Ambulatory Visit: Payer: Self-pay

## 2022-01-01 ENCOUNTER — Ambulatory Visit
Admission: RE | Admit: 2022-01-01 | Discharge: 2022-01-01 | Disposition: A | Discharge status: Home / Self Care | Payer: PPO | Source: Ambulatory Visit | Attending: Radiation Oncology | Admitting: Radiation Oncology

## 2022-01-01 DIAGNOSIS — C61 Malignant neoplasm of prostate: Secondary | ICD-10-CM | POA: Diagnosis not present

## 2022-01-01 DIAGNOSIS — Z191 Hormone sensitive malignancy status: Secondary | ICD-10-CM | POA: Diagnosis not present

## 2022-01-01 DIAGNOSIS — Z510 Encounter for antineoplastic radiation therapy: Secondary | ICD-10-CM | POA: Diagnosis not present

## 2022-01-01 LAB — RAD ONC ARIA SESSION SUMMARY
Course Elapsed Days: 31
Plan Fractions Treated to Date: 22
Plan Prescribed Dose Per Fraction: 2.5 Gy
Plan Total Fractions Prescribed: 28
Plan Total Prescribed Dose: 70 Gy
Reference Point Dosage Given to Date: 55 Gy
Reference Point Session Dosage Given: 2.5 Gy
Session Number: 22

## 2022-01-02 ENCOUNTER — Other Ambulatory Visit: Payer: Self-pay

## 2022-01-02 ENCOUNTER — Ambulatory Visit
Admission: RE | Admit: 2022-01-02 | Discharge: 2022-01-02 | Disposition: A | Discharge status: Home / Self Care | Payer: PPO | Source: Ambulatory Visit | Attending: Radiation Oncology | Admitting: Radiation Oncology

## 2022-01-02 DIAGNOSIS — C61 Malignant neoplasm of prostate: Secondary | ICD-10-CM | POA: Diagnosis not present

## 2022-01-02 DIAGNOSIS — Z191 Hormone sensitive malignancy status: Secondary | ICD-10-CM | POA: Diagnosis not present

## 2022-01-02 DIAGNOSIS — Z510 Encounter for antineoplastic radiation therapy: Secondary | ICD-10-CM | POA: Diagnosis not present

## 2022-01-02 LAB — RAD ONC ARIA SESSION SUMMARY
Course Elapsed Days: 32
Plan Fractions Treated to Date: 23
Plan Prescribed Dose Per Fraction: 2.5 Gy
Plan Total Fractions Prescribed: 28
Plan Total Prescribed Dose: 70 Gy
Reference Point Dosage Given to Date: 57.5 Gy
Reference Point Session Dosage Given: 2.5 Gy
Session Number: 23

## 2022-01-05 ENCOUNTER — Other Ambulatory Visit: Payer: Self-pay

## 2022-01-05 ENCOUNTER — Ambulatory Visit
Admission: RE | Admit: 2022-01-05 | Discharge: 2022-01-05 | Disposition: A | Discharge status: Home / Self Care | Payer: PPO | Source: Ambulatory Visit | Attending: Radiation Oncology | Admitting: Radiation Oncology

## 2022-01-05 DIAGNOSIS — C61 Malignant neoplasm of prostate: Secondary | ICD-10-CM | POA: Diagnosis not present

## 2022-01-05 DIAGNOSIS — Z191 Hormone sensitive malignancy status: Secondary | ICD-10-CM | POA: Diagnosis not present

## 2022-01-05 DIAGNOSIS — Z510 Encounter for antineoplastic radiation therapy: Secondary | ICD-10-CM | POA: Diagnosis not present

## 2022-01-05 LAB — RAD ONC ARIA SESSION SUMMARY
Course Elapsed Days: 35
Plan Fractions Treated to Date: 24
Plan Prescribed Dose Per Fraction: 2.5 Gy
Plan Total Fractions Prescribed: 28
Plan Total Prescribed Dose: 70 Gy
Reference Point Dosage Given to Date: 60 Gy
Reference Point Session Dosage Given: 2.5 Gy
Session Number: 24

## 2022-01-06 ENCOUNTER — Ambulatory Visit
Admission: RE | Admit: 2022-01-06 | Discharge: 2022-01-06 | Disposition: A | Discharge status: Home / Self Care | Payer: PPO | Source: Ambulatory Visit | Attending: Radiation Oncology | Admitting: Radiation Oncology

## 2022-01-06 ENCOUNTER — Other Ambulatory Visit: Payer: Self-pay

## 2022-01-06 DIAGNOSIS — C61 Malignant neoplasm of prostate: Secondary | ICD-10-CM | POA: Diagnosis not present

## 2022-01-06 DIAGNOSIS — Z510 Encounter for antineoplastic radiation therapy: Secondary | ICD-10-CM | POA: Diagnosis not present

## 2022-01-06 DIAGNOSIS — Z191 Hormone sensitive malignancy status: Secondary | ICD-10-CM | POA: Diagnosis not present

## 2022-01-06 LAB — RAD ONC ARIA SESSION SUMMARY
Course Elapsed Days: 36
Plan Fractions Treated to Date: 25
Plan Prescribed Dose Per Fraction: 2.5 Gy
Plan Total Fractions Prescribed: 28
Plan Total Prescribed Dose: 70 Gy
Reference Point Dosage Given to Date: 62.5 Gy
Reference Point Session Dosage Given: 2.5 Gy
Session Number: 25

## 2022-01-07 ENCOUNTER — Ambulatory Visit
Admission: RE | Admit: 2022-01-07 | Discharge: 2022-01-07 | Disposition: A | Discharge status: Home / Self Care | Payer: PPO | Source: Ambulatory Visit | Attending: Radiation Oncology | Admitting: Radiation Oncology

## 2022-01-07 ENCOUNTER — Other Ambulatory Visit: Payer: Self-pay

## 2022-01-07 DIAGNOSIS — C61 Malignant neoplasm of prostate: Secondary | ICD-10-CM | POA: Diagnosis not present

## 2022-01-07 DIAGNOSIS — Z191 Hormone sensitive malignancy status: Secondary | ICD-10-CM | POA: Diagnosis not present

## 2022-01-07 DIAGNOSIS — Z510 Encounter for antineoplastic radiation therapy: Secondary | ICD-10-CM | POA: Diagnosis not present

## 2022-01-07 LAB — RAD ONC ARIA SESSION SUMMARY
Course Elapsed Days: 37
Plan Fractions Treated to Date: 26
Plan Prescribed Dose Per Fraction: 2.5 Gy
Plan Total Fractions Prescribed: 28
Plan Total Prescribed Dose: 70 Gy
Reference Point Dosage Given to Date: 65 Gy
Reference Point Session Dosage Given: 2.5 Gy
Session Number: 26

## 2022-01-08 ENCOUNTER — Other Ambulatory Visit: Payer: Self-pay

## 2022-01-08 ENCOUNTER — Ambulatory Visit
Admission: RE | Admit: 2022-01-08 | Discharge: 2022-01-08 | Disposition: A | Discharge status: Home / Self Care | Payer: PPO | Source: Ambulatory Visit | Attending: Radiation Oncology | Admitting: Radiation Oncology

## 2022-01-08 DIAGNOSIS — C61 Malignant neoplasm of prostate: Secondary | ICD-10-CM | POA: Diagnosis not present

## 2022-01-08 DIAGNOSIS — Z510 Encounter for antineoplastic radiation therapy: Secondary | ICD-10-CM | POA: Diagnosis not present

## 2022-01-08 DIAGNOSIS — Z191 Hormone sensitive malignancy status: Secondary | ICD-10-CM | POA: Diagnosis not present

## 2022-01-08 LAB — RAD ONC ARIA SESSION SUMMARY
Course Elapsed Days: 38
Plan Fractions Treated to Date: 27
Plan Prescribed Dose Per Fraction: 2.5 Gy
Plan Total Fractions Prescribed: 28
Plan Total Prescribed Dose: 70 Gy
Reference Point Dosage Given to Date: 67.5 Gy
Reference Point Session Dosage Given: 2.5 Gy
Session Number: 27

## 2022-01-09 ENCOUNTER — Encounter: Payer: Self-pay | Admitting: Urology

## 2022-01-09 ENCOUNTER — Ambulatory Visit
Admission: RE | Admit: 2022-01-09 | Discharge: 2022-01-09 | Disposition: A | Discharge status: Home / Self Care | Payer: PPO | Source: Ambulatory Visit | Attending: Radiation Oncology | Admitting: Radiation Oncology

## 2022-01-09 ENCOUNTER — Other Ambulatory Visit: Payer: Self-pay

## 2022-01-09 DIAGNOSIS — C61 Malignant neoplasm of prostate: Secondary | ICD-10-CM | POA: Diagnosis not present

## 2022-01-09 DIAGNOSIS — Z510 Encounter for antineoplastic radiation therapy: Secondary | ICD-10-CM | POA: Diagnosis not present

## 2022-01-09 DIAGNOSIS — Z191 Hormone sensitive malignancy status: Secondary | ICD-10-CM | POA: Diagnosis not present

## 2022-01-09 LAB — RAD ONC ARIA SESSION SUMMARY
Course Elapsed Days: 39
Plan Fractions Treated to Date: 28
Plan Prescribed Dose Per Fraction: 2.5 Gy
Plan Total Fractions Prescribed: 28
Plan Total Prescribed Dose: 70 Gy
Reference Point Dosage Given to Date: 70 Gy
Reference Point Session Dosage Given: 2.5 Gy
Session Number: 28

## 2022-01-12 ENCOUNTER — Other Ambulatory Visit: Payer: Self-pay | Admitting: Internal Medicine

## 2022-01-12 ENCOUNTER — Encounter: Payer: Self-pay | Admitting: Internal Medicine

## 2022-01-12 NOTE — Patient Instructions (Addendum)
Flu immunization administered today.     Blood work was ordered.   The lab is on the first floor.    Medications changes include :   none     Return in about 1 year (around 01/14/2023) for Physical Exam.   Health Maintenance, Male Adopting a healthy lifestyle and getting preventive care are important in promoting health and wellness. Ask your health care provider about: The right schedule for you to have regular tests and exams. Things you can do on your own to prevent diseases and keep yourself healthy. What should I know about diet, weight, and exercise? Eat a healthy diet  Eat a diet that includes plenty of vegetables, fruits, low-fat dairy products, and lean protein. Do not eat a lot of foods that are high in solid fats, added sugars, or sodium. Maintain a healthy weight Body mass index (BMI) is a measurement that can be used to identify possible weight problems. It estimates body fat based on height and weight. Your health care provider can help determine your BMI and help you achieve or maintain a healthy weight. Get regular exercise Get regular exercise. This is one of the most important things you can do for your health. Most adults should: Exercise for at least 150 minutes each week. The exercise should increase your heart rate and make you sweat (moderate-intensity exercise). Do strengthening exercises at least twice a week. This is in addition to the moderate-intensity exercise. Spend less time sitting. Even light physical activity can be beneficial. Watch cholesterol and blood lipids Have your blood tested for lipids and cholesterol at 81 years of age, then have this test every 5 years. You may need to have your cholesterol levels checked more often if: Your lipid or cholesterol levels are high. You are older than 81 years of age. You are at high risk for heart disease. What should I know about cancer screening? Many types of cancers can be detected early and  may often be prevented. Depending on your health history and family history, you may need to have cancer screening at various ages. This may include screening for: Colorectal cancer. Prostate cancer. Skin cancer. Lung cancer. What should I know about heart disease, diabetes, and high blood pressure? Blood pressure and heart disease High blood pressure causes heart disease and increases the risk of stroke. This is more likely to develop in people who have high blood pressure readings or are overweight. Talk with your health care provider about your target blood pressure readings. Have your blood pressure checked: Every 3-5 years if you are 66-62 years of age. Every year if you are 53 years old or older. If you are between the ages of 44 and 29 and are a current or former smoker, ask your health care provider if you should have a one-time screening for abdominal aortic aneurysm (AAA). Diabetes Have regular diabetes screenings. This checks your fasting blood sugar level. Have the screening done: Once every three years after age 32 if you are at a normal weight and have a low risk for diabetes. More often and at a younger age if you are overweight or have a high risk for diabetes. What should I know about preventing infection? Hepatitis B If you have a higher risk for hepatitis B, you should be screened for this virus. Talk with your health care provider to find out if you are at risk for hepatitis B infection. Hepatitis C Blood testing is recommended for: Everyone born from [birth year]  through 1965. Anyone with known risk factors for hepatitis C. Sexually transmitted infections (STIs) You should be screened each year for STIs, including gonorrhea and chlamydia, if: You are sexually active and are younger than 81 years of age. You are older than 81 years of age and your health care provider tells you that you are at risk for this type of infection. Your sexual activity has changed since you were  last screened, and you are at increased risk for chlamydia or gonorrhea. Ask your health care provider if you are at risk. Ask your health care provider about whether you are at high risk for HIV. Your health care provider may recommend a prescription medicine to help prevent HIV infection. If you choose to take medicine to prevent HIV, you should first get tested for HIV. You should then be tested every 3 months for as long as you are taking the medicine. Follow these instructions at home: Alcohol use Do not drink alcohol if your health care provider tells you not to drink. If you drink alcohol: Limit how much you have to 0-2 drinks a day. Know how much alcohol is in your drink. In the [place] one drink equals one 12 oz bottle of beer (355 mL), one 5 oz glass of wine (148 mL), or one 1 oz glass of hard liquor (44 mL). Lifestyle Do not use any products that contain nicotine or tobacco. These products include cigarettes, chewing tobacco, and vaping devices, such as e-cigarettes. If you need help quitting, ask your health care provider. Do not use street drugs. Do not share needles. Ask your health care provider for help if you need support or information about quitting drugs. General instructions Schedule regular health, dental, and eye exams. Stay current with your vaccines. Tell your health care provider if: You often feel depressed. You have ever been abused or do not feel safe at home. Summary Adopting a healthy lifestyle and getting preventive care are important in promoting health and wellness. Follow your health care provider's instructions about healthy diet, exercising, and getting tested or screened for diseases. Follow your health care provider's instructions on monitoring your cholesterol and blood pressure. This information is not intended to replace advice given to you by your health care provider. Make sure you discuss any questions you have with your health care  provider. Document Revised: 06/03/2020 Document Reviewed: 06/03/2020 Elsevier Patient Education  [place].

## 2022-01-12 NOTE — Progress Notes (Unsigned)
Subjective:    Patient ID: Preston Reeves, male    DOB: May 05, 1940, 81 y.o.   MRN: [id number]     [facility]PI [facility] is here for a physical exam.   S/p R CTS surgery  Prostate ca s/p radiation - finished last friday   Medications and allergies reviewed with patient and updated if appropriate.  Current Outpatient Medications on File Prior to Visit  Medication Sig Dispense Refill   acetaminophen (TYLENOL) 500 MG tablet Take 500 mg by mouth every 6 (six) hours as needed for mild pain.     albuterol (VENTOLIN HFA) 108 (90 Base) MCG/ACT inhaler INHALE 1 TO 2 PUFFS INTO THE LUNGS EVERY 6 HOURS AS NEEDED FOR WHEEZING OR SHORTNESS OF BREATH 18 g 8   Ascorbic Acid (VITAMIN C) 1000 MG tablet Take 1,000 mg by mouth daily.     aspirin 81 MG tablet Take 81 mg by mouth daily.     Calcium Carbonate-Vitamin D (CALCIUM + D PO) Take 1 tablet by mouth daily.      Cholecalciferol (VITAMIN D) 2000 UNITS CAPS Take 2,000 Units by mouth daily.     Docusate Sodium (COLACE PO) Take 200 mg by mouth in the morning and at bedtime.     fexofenadine (ALLEGRA) 180 MG tablet Take 180 mg by mouth daily.     FIBER PO Take by mouth in the morning and at bedtime.     fluticasone (FLONASE) 50 MCG/ACT nasal spray Place 2 sprays into both nostrils daily. 16 g 6   Glucosamine-Chondroit-Vit C-Mn (GLUCOSAMINE 1500 COMPLEX PO) Take by mouth in the morning and at bedtime.     HYDROcodone-acetaminophen (NORCO/VICODIN) 5-325 MG tablet Take 1-2 tablets by mouth every 6 (six) hours as needed for moderate pain. 20 tablet 0   losartan (COZAAR) 50 MG tablet Take 1 tablet (50 mg total) by mouth 2 (two) times daily. 180 tablet 3   metoprolol succinate (TOPROL-XL) 50 MG 24 hr tablet TAKE 1 TABLET(50 MG) BY MOUTH DAILY WITH OR IMMEDIATELY FOLLOWING A MEAL 90 tablet 0   Multiple Vitamins-Minerals (MULTIVITAMIN PO) Take 1 tablet by mouth daily.      Omega-3 Fatty Acids (FISH OIL PO) Take 1,000 mg by mouth in the morning and at bedtime.     Red  Yeast Rice 600 MG CAPS Take 1,200 mg by mouth daily.      SALINE NASAL SPRAY NA Place 1 spray into the nose daily as needed (allergies).      SYNTHROID 150 MCG tablet TAKE 1 TABLET(150 MCG) BY MOUTH DAILY 90 tablet 1   umeclidinium-vilanterol (ANORO ELLIPTA) 62.5-25 MCG/ACT AEPB INHALE 1 PUFF INTO THE LUNGS DAILY AT 6 AM 60 each 2   vitamin B-12 (CYANOCOBALAMIN) 1000 MCG tablet Take 1,000 mcg by mouth daily.     No current facility-administered medications on file prior to visit.    Review of Systems  Constitutional:  Negative for chills and fever.  Eyes:  Negative for visual disturbance.  Respiratory:  Positive for shortness of breath (DOE - chronic - stable). Negative for cough and wheezing.   Cardiovascular:  Negative for chest pain, palpitations and leg swelling.  Gastrointestinal:  Negative for abdominal pain, blood in stool, constipation, diarrhea and nausea.  Genitourinary:  Negative for hematuria.  Musculoskeletal:  Positive for arthralgias. Negative for back pain.  Skin:  Negative for rash.  Neurological:  Negative for light-headedness and headaches.  Psychiatric/Behavioral:  Negative for dysphoric mood. The patient is not nervous/anxious.  Objective:   Vitals:   01/13/22 1342  BP: 120/68  Pulse: 89  Temp: 98 F (36.7 C)  SpO2: 98%   Filed Weights   01/13/22 1342  Weight: 183 lb (83 kg)   Body mass index is 25.52 kg/m.  BP Readings from Last 3 Encounters:  01/13/22 120/68  11/17/21 134/68  10/21/21 (!) 140/63    Wt Readings from Last 3 Encounters:  01/13/22 183 lb (83 kg)  11/17/21 178 lb (80.7 kg)  10/21/21 181 lb 3.2 oz (82.2 kg)      Physical Exam Constitutional: He appears well-developed and well-nourished. No distress.  HENT:  Head: Normocephalic and atraumatic.  Right Ear: External ear normal.  Left Ear: External ear normal.  Mouth/Throat: Oropharynx is clear and moist.  Normal ear canals and TM b/l  Eyes: Conjunctivae and EOM are  normal.  Neck: Neck supple. No tracheal deviation present. No thyromegaly present.  No carotid bruit  Cardiovascular: Normal rate, regular rhythm, normal heart sounds and intact distal pulses.   2/6 sys murmur heard. Pulmonary/Chest: Effort normal and breath sounds normal. No respiratory distress. He has no wheezes. He has no rales.  Abdominal: Soft. He exhibits no distension. There is no tenderness.  Genitourinary: deferred  Musculoskeletal: He exhibits no edema.  Lymphadenopathy:   He has no cervical adenopathy.  Skin: Skin is warm and dry. He is not diaphoretic.  Psychiatric: He has a normal mood and affect. His behavior is normal.         Assessment & Plan:   Physical exam: Screening blood work  ordered Exercise   none Weight  normal Substance abuse   none   Reviewed recommended immunizations.  Flu immunization administered today.     Health Maintenance  Topic Date Due   INFLUENZA VACCINE  08/26/2021   Medicare Annual Wellness (AWV)  12/13/2021   COVID-19 Vaccine (3 - Pfizer risk series) 01/29/2022 (Originally 06/14/2019)   Zoster Vaccines- Shingrix (1 of 2) 04/14/2022 (Originally 02/27/1959)   DTaP/Tdap/Td (2 - Tdap) 06/21/2023   COLONOSCOPY (Pts 45-76yr Insurance coverage will need to be confirmed)  10/11/2023   Pneumonia Vaccine 81 Years old  Completed   HPV VACCINES  Aged Out     See Problem List for Assessment and Plan of chronic medical problems.

## 2022-01-13 ENCOUNTER — Ambulatory Visit (INDEPENDENT_AMBULATORY_CARE_PROVIDER_SITE_OTHER): Payer: PPO | Admitting: Internal Medicine

## 2022-01-13 VITALS — BP 120/68 | HR 89 | Temp 98.0°F | Ht 71.0 in | Wt 183.0 lb

## 2022-01-13 DIAGNOSIS — Z0000 Encounter for general adult medical examination without abnormal findings: Secondary | ICD-10-CM

## 2022-01-13 DIAGNOSIS — J449 Chronic obstructive pulmonary disease, unspecified: Secondary | ICD-10-CM | POA: Diagnosis not present

## 2022-01-13 DIAGNOSIS — C61 Malignant neoplasm of prostate: Secondary | ICD-10-CM | POA: Diagnosis not present

## 2022-01-13 DIAGNOSIS — Z23 Encounter for immunization: Secondary | ICD-10-CM

## 2022-01-13 DIAGNOSIS — E039 Hypothyroidism, unspecified: Secondary | ICD-10-CM

## 2022-01-13 DIAGNOSIS — I10 Essential (primary) hypertension: Secondary | ICD-10-CM | POA: Diagnosis not present

## 2022-01-13 DIAGNOSIS — R739 Hyperglycemia, unspecified: Secondary | ICD-10-CM

## 2022-01-13 DIAGNOSIS — E890 Postprocedural hypothyroidism: Secondary | ICD-10-CM

## 2022-01-13 LAB — CBC WITH DIFFERENTIAL/PLATELET
Basophils Absolute: 0 10*3/uL (ref 0.0–0.1)
Basophils Relative: 0.2 % (ref 0.0–3.0)
Eosinophils Absolute: 0.1 10*3/uL (ref 0.0–0.7)
Eosinophils Relative: 1.5 % (ref 0.0–5.0)
HCT: 34.8 % — ABNORMAL LOW (ref 39.0–52.0)
Hemoglobin: 12.2 g/dL — ABNORMAL LOW (ref 13.0–17.0)
Lymphocytes Relative: 7.4 % — ABNORMAL LOW (ref 12.0–46.0)
Lymphs Abs: 0.4 10*3/uL — ABNORMAL LOW (ref 0.7–4.0)
MCHC: 34.9 g/dL (ref 30.0–36.0)
MCV: 97.9 fl (ref 78.0–100.0)
Monocytes Absolute: 0.7 10*3/uL (ref 0.1–1.0)
Monocytes Relative: 11.4 % (ref 3.0–12.0)
Neutro Abs: 4.8 10*3/uL (ref 1.4–7.7)
Neutrophils Relative %: 79.7 % — ABNORMAL HIGH (ref 43.0–77.0)
Platelets: 276 10*3/uL (ref 150.0–400.0)
RBC: 3.56 Mil/uL — ABNORMAL LOW (ref 4.22–5.81)
RDW: 13.6 % (ref 11.5–15.5)
WBC: 6 10*3/uL (ref 4.0–10.5)

## 2022-01-13 LAB — COMPREHENSIVE METABOLIC PANEL
ALT: 11 U/L (ref 0–53)
AST: 15 U/L (ref 0–37)
Albumin: 3.9 g/dL (ref 3.5–5.2)
Alkaline Phosphatase: 60 U/L (ref 39–117)
BUN: 22 mg/dL (ref 6–23)
CO2: 28 mEq/L (ref 19–32)
Calcium: 9.7 mg/dL (ref 8.4–10.5)
Chloride: 103 mEq/L (ref 96–112)
Creatinine, Ser: 0.8 mg/dL (ref 0.40–1.50)
GFR: 82.78 mL/min (ref >60.00)
Glucose, Bld: 117 mg/dL — ABNORMAL HIGH (ref 70–99)
Potassium: 3.8 mEq/L (ref 3.5–5.1)
Sodium: 136 mEq/L (ref 135–145)
Total Bilirubin: 0.5 mg/dL (ref 0.2–1.2)
Total Protein: 8.2 g/dL (ref 6.0–8.3)

## 2022-01-13 LAB — TSH: TSH: 0.48 u[IU]/mL (ref 0.35–5.50)

## 2022-01-13 LAB — LIPID PANEL
Cholesterol: 129 mg/dL (ref 0–200)
HDL: 35.9 mg/dL — ABNORMAL LOW (ref >39.00)
LDL Cholesterol: 71 mg/dL (ref 0–99)
NonHDL: 93.46
Total CHOL/HDL Ratio: 4
Triglycerides: 113 mg/dL (ref 0.0–149.0)
VLDL: 22.6 mg/dL (ref 0.0–40.0)

## 2022-01-13 LAB — HEMOGLOBIN A1C: Hgb A1c MFr Bld: 5.9 % (ref 4.6–6.5)

## 2022-01-13 MED ORDER — SYNTHROID 150 MCG PO TABS: ORAL_TABLET | ORAL | 3 refills | Status: DC

## 2022-01-13 NOTE — Assessment & Plan Note (Addendum)
Chronic  Clinically euthyroid Check tsh and will titrate med dose if needed Currently taking Synthroid 150 mcg daily

## 2022-01-13 NOTE — Assessment & Plan Note (Signed)
S/p radiation - completed a few days ago - having fatigue

## 2022-01-13 NOTE — Assessment & Plan Note (Signed)
Chronic BP well controlled Continue metoprolol xl 50 mg daily, losartan 50 mg bid cmp

## 2022-01-13 NOTE — Assessment & Plan Note (Signed)
Chronic SOB - stable Taking anoro daily which has helped, continue Continue albuterol as needed

## 2022-01-13 NOTE — Addendum Note (Signed)
Addended by: Marcina Millard on: 01/13/2022 02:26 PM   Modules accepted: Orders

## 2022-01-13 NOTE — Assessment & Plan Note (Signed)
Chronic Check a1c Low sugar / carb diet Stressed regular exercise  

## 2022-01-21 ENCOUNTER — Ambulatory Visit: Payer: PPO | Admitting: Orthopaedic Surgery

## 2022-01-21 ENCOUNTER — Encounter: Payer: Self-pay | Admitting: Orthopaedic Surgery

## 2022-01-21 DIAGNOSIS — Z9889 Other specified postprocedural states: Secondary | ICD-10-CM

## 2022-01-21 NOTE — Progress Notes (Signed)
The patient is between 6 and 8 weeks status post a right open carpal tunnel release.  He is 81 years old.  His carpal tunnel syndrome was severe.  He does have moderate to severe carpal tunnel syndrome on the left side.  He has been going through radiation treatments for prostate cancer and he said that has caused weakness in most of his joints and muscle groups overall.  On exam his right hand incision has healed.  There is no swelling in his hand.  His grip strength is still significantly weak and.  I did offer physical therapy but showed him some things he can do to strengthen his hands.  Will see him back in 3 months to see how he is doing overall.  We can discuss his left carpal tunnel syndrome at that visit if needed.

## 2022-01-30 NOTE — Progress Notes (Signed)
Patient recently completed radiation treatment for his stage T1c adenocarcinoma of the prostate with a Gleason's score of 4+3 and a PSA of 9.83.   RN left voicemail for call back to assess any additional needs since completing treatment.   Patient is scheduled for his 1 month telephone nurse on 1/30, and has his post treatment PSA scheduled for 04/29/2022 @ [facility].

## 2022-02-03 ENCOUNTER — Other Ambulatory Visit: Payer: Self-pay | Admitting: Hematology & Oncology

## 2022-02-12 ENCOUNTER — Encounter: Payer: Self-pay | Admitting: Family

## 2022-02-12 NOTE — Progress Notes (Signed)
                                                                                                                                                             Patient Name: Preston Reeves MRN: [id number] DOB: 02-17-1940 Referring Physician: Dutch Gray (Profile Not Attached) Date of Service: 01/09/2022 [facility], [place]                                                        End Of Treatment Note  Diagnoses: C61-Malignant neoplasm of prostate  Cancer Staging: 82 y.o. gentleman with stage T1c adenocarcinoma of the prostate with a Gleason's score of 4+3 and a PSA of 9.83   Intent: Curative  Radiation Treatment Dates: 12/01/2021 through 01/09/2022 Site Technique Total Dose (Gy) Dose per Fx (Gy) Completed Fx Beam Energies  Prostate: Prostate IMRT 70/70 2.5 28/28 6X   Narrative: The patient tolerated radiation therapy relatively well. He reported modest fatifue and nocturia x1/night.  Plan: The patient will receive a call in about one month from the radiation oncology department. He will continue follow up with his urologist, Dr. Alinda Money as well. ------------------------------------------------   Tyler Pita, MD [place]: [phone]  Fax: [phone] [email or web address]  Skype  [email or web address]

## 2022-02-17 ENCOUNTER — Other Ambulatory Visit (INDEPENDENT_AMBULATORY_CARE_PROVIDER_SITE_OTHER): Payer: PPO

## 2022-02-17 ENCOUNTER — Telehealth: Payer: Self-pay | Admitting: Internal Medicine

## 2022-02-17 DIAGNOSIS — R5383 Other fatigue: Secondary | ICD-10-CM

## 2022-02-17 LAB — COMPREHENSIVE METABOLIC PANEL
ALT: 10 U/L (ref 0–53)
AST: 16 U/L (ref 0–37)
Albumin: 3.9 g/dL (ref 3.5–5.2)
Alkaline Phosphatase: 52 U/L (ref 39–117)
BUN: 36 mg/dL — ABNORMAL HIGH (ref 6–23)
CO2: 28 mEq/L (ref 19–32)
Calcium: 10 mg/dL (ref 8.4–10.5)
Chloride: 105 mEq/L (ref 96–112)
Creatinine, Ser: 0.71 mg/dL (ref 0.40–1.50)
GFR: 85.76 mL/min (ref >60.00)
Glucose, Bld: 118 mg/dL — ABNORMAL HIGH (ref 70–99)
Potassium: 4 mEq/L (ref 3.5–5.1)
Sodium: 141 mEq/L (ref 135–145)
Total Bilirubin: 0.4 mg/dL (ref 0.2–1.2)
Total Protein: 8.6 g/dL — ABNORMAL HIGH (ref 6.0–8.3)

## 2022-02-17 LAB — CBC WITH DIFFERENTIAL/PLATELET
Basophils Absolute: 0 10*3/uL (ref 0.0–0.1)
Basophils Relative: 0.3 % (ref 0.0–3.0)
Eosinophils Absolute: 0.2 10*3/uL (ref 0.0–0.7)
Eosinophils Relative: 2.5 % (ref 0.0–5.0)
HCT: 34.5 % — ABNORMAL LOW (ref 39.0–52.0)
Hemoglobin: 11.8 g/dL — ABNORMAL LOW (ref 13.0–17.0)
Lymphocytes Relative: 10 % — ABNORMAL LOW (ref 12.0–46.0)
Lymphs Abs: 0.7 10*3/uL (ref 0.7–4.0)
MCHC: 34.1 g/dL (ref 30.0–36.0)
MCV: 97.8 fl (ref 78.0–100.0)
Monocytes Absolute: 0.7 10*3/uL (ref 0.1–1.0)
Monocytes Relative: 10.6 % (ref 3.0–12.0)
Neutro Abs: 5.3 10*3/uL (ref 1.4–7.7)
Neutrophils Relative %: 76.6 % (ref 43.0–77.0)
Platelets: 333 10*3/uL (ref 150.0–400.0)
RBC: 3.52 Mil/uL — ABNORMAL LOW (ref 4.22–5.81)
RDW: 13.3 % (ref 11.5–15.5)
WBC: 7 10*3/uL (ref 4.0–10.5)

## 2022-02-17 NOTE — Telephone Encounter (Signed)
Lab appointment made.

## 2022-02-17 NOTE — Telephone Encounter (Signed)
Patients wife called and said patient had radiation and was doing bad for a while but is doing a little better now but is shaky in the mornings. She said he's tired and weak all the time. She was concerned about his blood work and wanted to know if it can be done. She did say he had tested positive for covid around 02/04/22 but is testing negative now. She wanted to know if they can just come in for blood work or if he needs an appointment since he was just seen 01/13/22. Call back is [phone]

## 2022-02-17 NOTE — Telephone Encounter (Signed)
We can check blood work-I can order it been coming to the lab.  He should also discuss symptoms with radiation oncology.  Some of the symptoms may be typical for the radiation.  Blood counts, kidney function and liver tests ordered.

## 2022-02-23 ENCOUNTER — Telehealth: Payer: Self-pay | Admitting: Internal Medicine

## 2022-02-23 NOTE — Progress Notes (Addendum)
  Radiation Oncology         [phone]) [phone] ________________________________  Name: Preston Reeves MRN: [id number]  Date of Service: 02/24/2022  DOB: 07/25/1940  Post Treatment Telephone Note  Diagnosis:  82 y.o. gentleman with stage T1c adenocarcinoma of the prostate with a Gleason's score of 4+3 and a PSA of 9.83   Intent: Curative  Radiation Treatment Dates: 12/01/2021 through 01/09/2022 Site Technique Total Dose (Gy) Dose per Fx (Gy) Completed Fx Beam Energies  Prostate: Prostate       (as documented in provider EOT note)   Pre Treatment IPSS Score: 1 (as documented in the provider consult note)   The patient was available for call today.   Symptoms of fatigue have improved mildly since completing therapy.  Symptoms of bladder changes have  improved since completing therapy. Current symptoms include none, and medications for bladder symptoms include none.  Symptoms of bowel changes have  improved since completing therapy. Current symptoms include none, and medications for bowel symptoms include none.  Patient's current complaints are general joint pains 6/10, fatigue, and muscle weakness.   Post Treatment IPSS Score: IPSS Questionnaire (AUA-7): Over the past month.   1)  How often have you had a sensation of not emptying your bladder completely after you finish urinating?  2 - Less than half the time  2)  How often have you had to urinate again less than two hours after you finished urinating? 1 - Less than 1 time in 5  3)  How often have you found you stopped and started again several times when you urinated?  1 - Less than 1 time in 5  4) How difficult have you found it to postpone urination?  1 - Less than 1 time in 5  5) How often have you had a weak urinary stream?  0 - Not at all  6) How often have you had to push or strain to begin urination?  0 - Not at all  7) How many times did you most typically get up to urinate from the time you went to bed until the time you got  up in the morning?  2 - 2 times  Total score:  7. Which indicates mild symptoms  0-7 mildly symptomatic   8-19 moderately symptomatic   20-35 severely symptomatic    Patient has a scheduled follow up visit with his urologist, Dr. Alinda Money, on April 2024 at [facility] for ongoing surveillance. He was counseled that PSA levels will be drawn in the urology office, and was reassured that additional time is expected to improve bowel and bladder symptoms. He was encouraged to call back with concerns or questions regarding radiation.  This concludes the interview.   Leandra Kern, LPN

## 2022-02-23 NOTE — Telephone Encounter (Signed)
Left message for patient to call back and schedule Medicare Annual Wellness Visit (AWV) in office.   Please offer to do virtually or by telephone.   Last AWV: 12/13/2020   Please schedule at any time with [facility].  30 minute appointment  Any questions, please contact me at [phone]   Thank you,   [place] [facility] [place] [facility] ??[phone] or ??[phone]

## 2022-02-24 ENCOUNTER — Ambulatory Visit
Admission: RE | Admit: 2022-02-24 | Discharge: 2022-02-24 | Disposition: A | Discharge status: Home / Self Care | Payer: PPO | Source: Ambulatory Visit | Attending: Radiation Oncology | Admitting: Radiation Oncology

## 2022-03-27 ENCOUNTER — Other Ambulatory Visit: Payer: Self-pay | Admitting: Internal Medicine

## 2022-04-23 ENCOUNTER — Ambulatory Visit (INDEPENDENT_AMBULATORY_CARE_PROVIDER_SITE_OTHER): Payer: PPO | Admitting: Orthopaedic Surgery

## 2022-04-23 ENCOUNTER — Encounter: Payer: Self-pay | Admitting: Orthopaedic Surgery

## 2022-04-23 DIAGNOSIS — Z9889 Other specified postprocedural states: Secondary | ICD-10-CM

## 2022-04-23 NOTE — Progress Notes (Signed)
The patient comes in today for follow-up over 4 months out from carpal tunnel surgery on his right side.  He is 82 years old.  We will to see him back today see what the recovery has been having.  He says he is doing great and has good pinch and grip strength.  He still reports some pain in his fingers but that is more from arthritis.  He has been going through radiation treatment for prostate cancer.  He says he is doing well overall in terms of his actives daily living and using his right hand.  He does use a squeeze ball as well for strength.  Examination of his right hand shows no muscle atrophy at all.  He has good pinch and grip strength and no numbness and tingling.  At this point he can follow-up as needed.  All question concerns were answered and addressed.

## 2022-04-29 DIAGNOSIS — C61 Malignant neoplasm of prostate: Secondary | ICD-10-CM | POA: Diagnosis not present

## 2022-04-30 ENCOUNTER — Other Ambulatory Visit: Payer: Self-pay | Admitting: Hematology & Oncology

## 2022-05-01 ENCOUNTER — Encounter: Payer: Self-pay | Admitting: Family

## 2022-05-06 DIAGNOSIS — C61 Malignant neoplasm of prostate: Secondary | ICD-10-CM | POA: Diagnosis not present

## 2022-05-08 ENCOUNTER — Other Ambulatory Visit: Payer: Self-pay

## 2022-05-08 ENCOUNTER — Inpatient Hospital Stay: Payer: PPO | Attending: Hematology & Oncology

## 2022-05-08 ENCOUNTER — Encounter: Payer: Self-pay | Admitting: *Deleted

## 2022-05-08 ENCOUNTER — Encounter: Payer: Self-pay | Admitting: Hematology & Oncology

## 2022-05-08 ENCOUNTER — Inpatient Hospital Stay (HOSPITAL_BASED_OUTPATIENT_CLINIC_OR_DEPARTMENT_OTHER): Payer: PPO | Admitting: Hematology & Oncology

## 2022-05-08 VITALS — BP 126/64 | HR 72 | Temp 97.5°F | Resp 18 | Ht 71.0 in | Wt 169.0 lb

## 2022-05-08 DIAGNOSIS — Z923 Personal history of irradiation: Secondary | ICD-10-CM | POA: Diagnosis not present

## 2022-05-08 DIAGNOSIS — Z79899 Other long term (current) drug therapy: Secondary | ICD-10-CM | POA: Diagnosis not present

## 2022-05-08 DIAGNOSIS — C61 Malignant neoplasm of prostate: Secondary | ICD-10-CM

## 2022-05-08 LAB — CBC WITH DIFFERENTIAL (CANCER CENTER ONLY)
Abs Immature Granulocytes: 0.01 10*3/uL (ref 0.00–0.07)
Basophils Absolute: 0 10*3/uL (ref 0.0–0.1)
Basophils Relative: 1 %
Eosinophils Absolute: 0.2 10*3/uL (ref 0.0–0.5)
Eosinophils Relative: 4 %
HCT: 39.2 % (ref 39.0–52.0)
Hemoglobin: 13.5 g/dL (ref 13.0–17.0)
Immature Granulocytes: 0 %
Lymphocytes Relative: 22 %
Lymphs Abs: 0.9 10*3/uL (ref 0.7–4.0)
MCH: 34.1 pg — ABNORMAL HIGH (ref 26.0–34.0)
MCHC: 34.4 g/dL (ref 30.0–36.0)
MCV: 99 fL (ref 80.0–100.0)
Monocytes Absolute: 0.7 10*3/uL (ref 0.1–1.0)
Monocytes Relative: 17 %
Neutro Abs: 2.1 10*3/uL (ref 1.7–7.7)
Neutrophils Relative %: 56 %
Platelet Count: 194 10*3/uL (ref 150–400)
RBC: 3.96 MIL/uL — ABNORMAL LOW (ref 4.22–5.81)
RDW: 13.1 % (ref 11.5–15.5)
WBC Count: 3.8 10*3/uL — ABNORMAL LOW (ref 4.0–10.5)
nRBC: 0 % (ref 0.0–0.2)

## 2022-05-08 LAB — IRON AND IRON BINDING CAPACITY (CC-WL,HP ONLY)
Iron: 199 ug/dL — ABNORMAL HIGH (ref 45–182)
Saturation Ratios: 88 % — ABNORMAL HIGH (ref 17.9–39.5)
TIBC: 225 ug/dL — ABNORMAL LOW (ref 250–450)
UIBC: 26 ug/dL — ABNORMAL LOW (ref 117–376)

## 2022-05-08 LAB — FERRITIN: Ferritin: 89 ng/mL (ref 24–336)

## 2022-05-08 LAB — CMP (CANCER CENTER ONLY)
ALT: 14 U/L (ref 0–44)
AST: 19 U/L (ref 15–41)
Albumin: 4.2 g/dL (ref 3.5–5.0)
Alkaline Phosphatase: 73 U/L (ref 38–126)
Anion gap: 8 (ref 5–15)
BUN: 19 mg/dL (ref 8–23)
CO2: 29 mmol/L (ref 22–32)
Calcium: 9.8 mg/dL (ref 8.9–10.3)
Chloride: 103 mmol/L (ref 98–111)
Creatinine: 0.95 mg/dL (ref 0.61–1.24)
GFR, Estimated: >60 mL/min (ref >60)
Glucose, Bld: 108 mg/dL — ABNORMAL HIGH (ref 70–99)
Potassium: 4 mmol/L (ref 3.5–5.1)
Sodium: 140 mmol/L (ref 135–145)
Total Bilirubin: 0.5 mg/dL (ref 0.3–1.2)
Total Protein: 8.1 g/dL (ref 6.5–8.1)

## 2022-05-08 NOTE — Progress Notes (Signed)
Hematology and Oncology Follow Up Visit  Preston Reeves [id number] 11-07-1940 82 y.o. 05/08/2022   Principle Diagnosis:  Hemochromatosis Localized prostate cancer -Gleason score 7/PSA 9.8  Current Therapy:   Phlebotomy to maintain ferritin less than 100 Status post radiotherapy-completed 01/09/2022      Interim History:  Preston Reeves is back for follow-up.  We see him back yearly.  He has been quite busy the past year.  He had prostate cancer.  This was localized prostate cancer.  He was found to have an elevated PSA.  Biopsy showed a Gleason score of 7.  He underwent radiation therapy.  He completed this in December.  He received 70 Gy.  He tolerated this well.  He also had carpal tunnel surgery.  This was done I think back in December or January.  Again he is doing well with this.  As far as the hemochromatosis is concerned, this really has not caused any problems for him.  When we last saw him, the ferritin was 27 with an iron saturation of 60%.  He has had no problems with COVID.  There is no nausea or vomiting.  He has had no change in bowel or bladder habits.  He has had no rashes.  Is been no leg swelling.  He has family GIST been a month down in [place].  They had a wonderful time down there.  They are on the [facility].  Currently, I would have said that his performance status is probably ECOG 1.  Medications:  Current Outpatient Medications:    acetaminophen (TYLENOL) 500 MG tablet, Take 500 mg by mouth every 6 (six) hours as needed for mild pain., Disp: , Rfl:    albuterol (VENTOLIN HFA) 108 (90 Base) MCG/ACT inhaler, INHALE 1 TO 2 PUFFS INTO THE LUNGS EVERY 6 HOURS AS NEEDED FOR WHEEZING OR SHORTNESS OF BREATH, Disp: 18 g, Rfl: 8   ANORO ELLIPTA 62.5-25 MCG/ACT AEPB, INHALE 1 PUFF INTO THE LUNGS DAILY AT 6 AM, Disp: 60 each, Rfl: 2   Ascorbic Acid (VITAMIN C) 1000 MG tablet, Take 1,000 mg by mouth daily., Disp: , Rfl:    aspirin 81 MG tablet, Take 81 mg by mouth daily.,  Disp: , Rfl:    Calcium Carbonate-Vitamin D (CALCIUM + D PO), Take 1 tablet by mouth daily. , Disp: , Rfl:    Cholecalciferol (VITAMIN D) 2000 UNITS CAPS, Take 2,000 Units by mouth daily., Disp: , Rfl:    Docusate Sodium (COLACE PO), Take 200 mg by mouth in the morning and at bedtime., Disp: , Rfl:    fexofenadine (ALLEGRA) 180 MG tablet, Take 180 mg by mouth daily., Disp: , Rfl:    FIBER PO, Take by mouth in the morning and at bedtime., Disp: , Rfl:    fluticasone (FLONASE) 50 MCG/ACT nasal spray, Place 2 sprays into both nostrils daily., Disp: 16 g, Rfl: 6   Glucosamine-Chondroit-Vit C-Mn (GLUCOSAMINE 1500 COMPLEX PO), Take by mouth in the morning and at bedtime., Disp: , Rfl:    HYDROcodone-acetaminophen (NORCO/VICODIN) 5-325 MG tablet, Take 1-2 tablets by mouth every 6 (six) hours as needed for moderate pain., Disp: 20 tablet, Rfl: 0   losartan (COZAAR) 50 MG tablet, Take 1 tablet (50 mg total) by mouth 2 (two) times daily., Disp: 180 tablet, Rfl: 3   metoprolol succinate (TOPROL-XL) 50 MG 24 hr tablet, TAKE 1 TABLET(50 MG) BY MOUTH DAILY WITH OR IMMEDIATELY FOLLOWING A MEAL, Disp: 90 tablet, Rfl: 0   Multiple Vitamins-Minerals (MULTIVITAMIN  PO), Take 1 tablet by mouth daily. , Disp: , Rfl:    Omega-3 Fatty Acids (FISH OIL PO), Take 1,000 mg by mouth in the morning and at bedtime., Disp: , Rfl:    Red Yeast Rice 600 MG CAPS, Take 1,200 mg by mouth daily. , Disp: , Rfl:    SALINE NASAL SPRAY NA, Place 1 spray into the nose daily as needed (allergies). , Disp: , Rfl:    SYNTHROID 150 MCG tablet, TAKE 1 TABLET(150 MCG) BY MOUTH DAILY, Disp: 90 tablet, Rfl: 3   vitamin B-12 (CYANOCOBALAMIN) 1000 MCG tablet, Take 1,000 mcg by mouth daily., Disp: , Rfl:   Allergies: No Known Allergies  Past Medical History, Surgical history, Social history, and Family History were reviewed and updated.  Review of Systems: Review of Systems  Constitutional: Negative.   HENT: Negative.    Eyes: Negative.    Respiratory: Negative.    Cardiovascular: Negative.   Gastrointestinal: Negative.   Genitourinary: Negative.   Musculoskeletal: Negative.   Skin: Negative.   Neurological: Negative.   Endo/Heme/Allergies: Negative.   Psychiatric/Behavioral: Negative.       Physical Exam:  height is 5\' 11"  (1.803 m) and weight is 169 lb (76.7 kg). His oral temperature is 97.5 F (36.4 C) (abnormal). His blood pressure is 126/64 and his pulse is 72. His respiration is 18 and oxygen saturation is 96%.   Wt Readings from Last 3 Encounters:  05/08/22 169 lb (76.7 kg)  01/13/22 183 lb (83 kg)  11/17/21 178 lb (80.7 kg)     Physical Exam Vitals reviewed.  HENT:     Head: Normocephalic and atraumatic.  Eyes:     Pupils: Pupils are equal, round, and reactive to light.  Cardiovascular:     Rate and Rhythm: Normal rate and regular rhythm.     Heart sounds: Normal heart sounds.  Pulmonary:     Effort: Pulmonary effort is normal.     Breath sounds: Normal breath sounds.  Abdominal:     General: Bowel sounds are normal.     Palpations: Abdomen is soft.  Musculoskeletal:        General: No tenderness or deformity. Normal range of motion.     Cervical back: Normal range of motion.  Lymphadenopathy:     Cervical: No cervical adenopathy.  Skin:    General: Skin is warm and dry.     Findings: No erythema or rash.  Neurological:     Mental Status: He is alert and oriented to person, place, and time.  Psychiatric:        Behavior: Behavior normal.        Thought Content: Thought content normal.        Judgment: Judgment normal.     Lab Results  Component Value Date   WBC 3.8 (L) 05/08/2022   HGB 13.5 05/08/2022   HCT 39.2 05/08/2022   MCV 99.0 05/08/2022   PLT 194 05/08/2022     Chemistry      Component Value Date/Time   NA 141 02/17/2022 1403   NA 141 03/11/2018 0842   NA 138 12/30/2015 1307   K 4.0 02/17/2022 1403   K 4.1 12/30/2015 1307   CL 105 02/17/2022 1403   CO2 28  02/17/2022 1403   CO2 24 12/30/2015 1307   BUN 36 (H) 02/17/2022 1403   BUN 15 03/11/2018 0842   BUN 17.4 12/30/2015 1307   CREATININE 0.71 02/17/2022 1403   CREATININE 0.89 05/07/2021 1022  CREATININE 0.9 12/30/2015 1307      Component Value Date/Time   CALCIUM 10.0 02/17/2022 1403   CALCIUM 9.6 12/30/2015 1307   ALKPHOS 52 02/17/2022 1403   ALKPHOS 69 12/30/2015 1307   AST 16 02/17/2022 1403   AST 19 05/07/2021 1022   AST 23 12/30/2015 1307   ALT 10 02/17/2022 1403   ALT 13 05/07/2021 1022   ALT 15 12/30/2015 1307   BILITOT 0.4 02/17/2022 1403   BILITOT 0.6 05/07/2021 1022   BILITOT 0.75 12/30/2015 1307      Impression and Plan: Preston Reeves is a 82 year old gentleman with hemochromatosis.  We will see what his iron studies look like.  Unfortunately, I do not think he will be able to donate blood because of the recent diagnosis of prostate cancer.  We will see what his iron studies look like.  As always, we will get him back in 1 year.  I know that he has a sister who has dementia.  He is doing a great job helping her out.  He is doing her taxes.    [name], MD 4/12/202410:59 AM

## 2022-05-11 ENCOUNTER — Telehealth: Payer: Self-pay | Admitting: *Deleted

## 2022-05-11 NOTE — Telephone Encounter (Signed)
Message received from patient to inform Dr. Myna Hidalgo that he will be going [facility] to [facility] blood and would like to cancel his two upcoming phlebotomies.  Dr. Myna Hidalgo notified.

## 2022-05-21 ENCOUNTER — Inpatient Hospital Stay: Payer: PPO

## 2022-05-21 NOTE — Patient Instructions (Signed)

## 2022-05-21 NOTE — Progress Notes (Signed)
Preston Reeves presents today for phlebotomy per MD orders. Phlebotomy procedure started at 1138  and ended at 1155. 517 grams removed from lt AC using 20g IV catheter by Leo Grosser, RN.  Patient observed for 30 minutes after procedure without any incident. Patient tolerated procedure well. IV needle removed intact.

## 2022-05-27 ENCOUNTER — Telehealth: Payer: PPO

## 2022-05-31 NOTE — Progress Notes (Signed)
Cardiology Office Note:    Date:  06/08/2022   ID:  Preston Reeves, DOB [birth year], MRN [id number]  PCP:  Pincus Sanes, MD   [facility]  Cardiologist:  Tobias Alexander, MD  Advanced Practice Provider:  No care team member to display Electrophysiologist:  None    Referring MD: Pincus Sanes, MD    History of Present Illness:    Preston Reeves is a 82 y.o. male with a hx of HTN, MVP, HLD, thyrotoxicosis, hemochromatosis, hypothyroidism S/P RAI who was previously followed by Dr. Delton See now returning to clinic for follow-up.  TTE 05/17/20 with LVEF 60-65%, G1DD, mild LVH, trivial MR  Was last seen in clinic on 04/2021 where he was doing well from a CV standpoint.  Today, the patient feels well. No chest pain, SOB, orthopnea, LE edema or PND. Has been treated for prostate cancer and received XRT. Has had some fatigue following XRT but he is slowly recovering. Otherwise, BP is well controlled. Tolerating medications as prescribed.   Past Medical History:  Diagnosis Date   Allergy    SEASONAL   Asthma    Cataract    Colon polyps    Adenomatous   COPD (chronic obstructive pulmonary disease) (HCC)    Diverticulitis of colon with perforation 2010   Colectomy/ostomy   Diverticulosis    hx of   Dysrhythmia    Hearing aid worn    bilaterally   Heart murmur    Hemochromatosis    hx of   History of BPH    History of COPD    Hyperlipidemia    Hypertension    Hypothyroidism    Incisional hernia    MVP (mitral valve prolapse)    Thyroid disease     Past Surgical History:  Procedure Laterality Date   COLONOSCOPY  last 09/25/2013   COLONOSCOPY W/ BIOPSIES  2011   Dr Marina Goodell   COLOSTOMY TAKEDOWN  2010   Dr Donell Beers   GOLD SEED IMPLANT N/A 11/17/2021   Procedure: GOLD SEED IMPLANT;  Surgeon: Heloise Purpura, MD;  Location: [facility];  Service: Urology;  Laterality: N/A;  15 MINUTES NEEDED FOR CASE   INGUINAL HERNIA REPAIR Left 1999    Dr. Maryagnes Amos   INSERTION OF MESH N/A 10/13/2013   Procedure: INSERTION OF MESH;  Surgeon: Karie Soda, MD;  Location: [facility] OR;  Service: General;  Laterality: N/A;   LAPAROSCOPIC LYSIS OF ADHESIONS N/A 10/13/2013   Procedure: LAPAROSCOPIC LYSIS OF ADHESIONS;  Surgeon: Karie Soda, MD;  Location: [facility] OR;  Service: General;  Laterality: N/A;   PARTIAL COLECTOMY  08/12/2008   Hartmann; perforated colon from diverticulitis   SPACE OAR INSTILLATION N/A 11/17/2021   Procedure: SPACE OAR INSTILLATION;  Surgeon: Heloise Purpura, MD;  Location[facility];  Service: Urology;  Laterality: N/A;   UPPER GI ENDOSCOPY     VENTRAL HERNIA REPAIR N/A 10/13/2013   Procedure: REPAIR OF VENTRAL WALL HERNIA WITH UNILATERAL COMPARTMENT SEPARATION;  Surgeon: Karie Soda, MD;  Location: [facility] OR;  Service: General;  Laterality: N/A;    Current Medications: Current Meds  Medication Sig   acetaminophen (TYLENOL) 500 MG tablet Take 500 mg by mouth every 6 (six) hours as needed for mild pain.   albuterol (VENTOLIN HFA) 108 (90 Base) MCG/ACT inhaler INHALE 1 TO 2 PUFFS INTO THE LUNGS EVERY 6 HOURS AS NEEDED FOR WHEEZING OR SHORTNESS OF BREATH   ANORO ELLIPTA 62.5-25 MCG/ACT AEPB INHALE 1  PUFF INTO THE LUNGS DAILY AT 6 AM   Ascorbic Acid (VITAMIN C) 1000 MG tablet Take 1,000 mg by mouth daily.   aspirin 81 MG tablet Take 81 mg by mouth daily.   Calcium Carbonate-Vitamin D (CALCIUM + D PO) Take 1 tablet by mouth daily.    Cholecalciferol (VITAMIN D) 2000 UNITS CAPS Take 2,000 Units by mouth daily.   Docusate Sodium (COLACE PO) Take 200 mg by mouth in the morning and at bedtime.   fexofenadine (ALLEGRA) 180 MG tablet Take 180 mg by mouth daily.   FIBER PO Take by mouth in the morning and at bedtime.   fluticasone (FLONASE) 50 MCG/ACT nasal spray Place 2 sprays into both nostrils daily.   Glucosamine-Chondroit-Vit C-Mn (GLUCOSAMINE 1500 COMPLEX PO) Take by mouth in the morning and at bedtime.    HYDROcodone-acetaminophen (NORCO/VICODIN) 5-325 MG tablet Take 1-2 tablets by mouth every 6 (six) hours as needed for moderate pain.   losartan (COZAAR) 50 MG tablet Take 1 tablet (50 mg total) by mouth 2 (two) times daily.   metoprolol succinate (TOPROL-XL) 50 MG 24 hr tablet TAKE 1 TABLET(50 MG) BY MOUTH DAILY WITH OR IMMEDIATELY FOLLOWING A MEAL   Multiple Vitamins-Minerals (MULTIVITAMIN PO) Take 1 tablet by mouth daily.    Omega-3 Fatty Acids (FISH OIL PO) Take 1,000 mg by mouth in the morning and at bedtime.   Red Yeast Rice 600 MG CAPS Take 1,200 mg by mouth daily.    SALINE NASAL SPRAY NA Place 1 spray into the nose daily as needed (allergies).    SYNTHROID 150 MCG tablet TAKE 1 TABLET(150 MCG) BY MOUTH DAILY   vitamin B-12 (CYANOCOBALAMIN) 1000 MCG tablet Take 1,000 mcg by mouth daily.     Allergies:   Patient has no known allergies.   Social History   Socioeconomic History   Marital status: Married    Spouse name: Not on file   Number of children: Not on file   Years of education: Not on file   Highest education level: Not on file  Occupational History   Occupation: Retired    Associate Professor: RETIRED  Tobacco Use   Smoking status: Former    Types: Cigarettes    Quit date: 2005    Years since quitting: 19.3   Smokeless tobacco: Never  Vaping Use   Vaping Use: Never used  Substance and Sexual Activity   Alcohol use: Yes    Alcohol/week: 7.0 standard drinks of alcohol    Types: 7 Cans of beer per week    Comment: drinks beer weekly   Drug use: No   Sexual activity: Yes  Other Topics Concern   Not on file  Social History Narrative   Married. Education: [employer].   Social Determinants of Health   Financial Resource Strain: Low Risk  (12/13/2020)   Overall Financial Resource Strain (CARDIA)    Difficulty of Paying Living Expenses: Not hard at all  Food Insecurity: No Food Insecurity (12/13/2020)   Hunger Vital Sign    Worried About Running Out of Food in the Last Year:  Never true    Ran Out of Food in the Last Year: Never true  Transportation Needs: No Transportation Needs (12/13/2020)   PRAPARE - Administrator, Civil Service (Medical): No    Lack of Transportation (Non-Medical): No  Physical Activity: Sufficiently Active (12/13/2020)   Exercise Vital Sign    Days of Exercise per Week: 5 days    Minutes of Exercise per Session:  30 min  Stress: No Stress Concern Present (12/13/2020)   [employer] of Occupational Health - Occupational Stress Questionnaire    Feeling of Stress : Not at all  Social Connections: Socially Integrated (12/13/2020)   Social Connection and Isolation Panel [NHANES]    Frequency of Communication with Friends and Family: More than three times a week    Frequency of Social Gatherings with Friends and Family: More than three times a week    Attends Religious Services: More than 4 times per year    Active Member of [employer] or Organizations: Yes    Attends Engineer, structural: More than 4 times per year    Marital Status: Married     Family History: The patient's family history includes Breast cancer (age of onset: 64 - 43) in his mother; Colon cancer (age of onset: 63 - 56) in his father; Diabetes in his father; Heart attack in his father; Heart disease in his father. There is no history of Stroke, Esophageal cancer, Rectal cancer, Stomach cancer, or Colon polyps.  ROS:   Please see the history of present illness.    Review of Systems  Constitutional:  Negative for chills, fever and malaise/fatigue.  HENT:  Negative for hearing loss.   Eyes:  Negative for blurred vision and redness.  Respiratory:  Negative for shortness of breath.   Cardiovascular:  Negative for chest pain, palpitations, orthopnea, claudication, leg swelling and PND.  Gastrointestinal:  Negative for melena, nausea and vomiting.  Genitourinary:  Negative for dysuria and flank pain.  Musculoskeletal:  Negative for falls.  Neurological:   Negative for dizziness and loss of consciousness.    EKGs/Labs/Other Studies Reviewed:    The following studies were reviewed today: TTE 2020/06/06: IMPRESSIONS   1. Left ventricular ejection fraction, by estimation, is 60 to 65%. The  left ventricle has normal function. The left ventricle has no regional  wall motion abnormalities. There is mild concentric left ventricular  hypertrophy. Left ventricular diastolic  parameters are consistent with Grade I diastolic dysfunction (impaired  relaxation).   2. The aortic valve is calcified. Aortic valve regurgitation is not  visualized. Aortic valve sclerosis/calcification is present, without any  Doppler evidence of aortic stenosis.   3. Right ventricular systolic function is normal. The right ventricular  size is normal.   4. The mitral valve is normal in structure. Trivial mitral valve  regurgitation. No evidence of mitral stenosis.   5. The inferior vena cava is normal in size with greater than 50%  respiratory variability, suggesting right atrial pressure of 3 mmHg.   Comparison(s): A prior study was performed on 01/09/2016. Calcifed  moderator band appears further thickened from prior study.   TTE 01/09/16: Study Conclusions  - Left ventricle: The cavity size was normal. Systolic function was    vigorous. The estimated ejection fraction was in the range of 65%    to 70%. Wall motion was normal; there were no regional wall    motion abnormalities. Doppler parameters are consistent with    abnormal left ventricular relaxation (grade 1 diastolic    dysfunction). There was no evidence of elevated ventricular    filling pressure by Doppler parameters.  - Aortic valve: Valve mobility was restricted. There was mild    stenosis. There was no regurgitation.  - Aortic root: The aortic root was normal in size.  - Ascending aorta: The ascending aorta was normal in size.  - Mitral valve: There was trivial regurgitation.  - Left atrium:  The  atrium was normal in size.  - Right ventricle: Systolic function was normal.  - Right atrium: The atrium was normal in size.  - Tricuspid valve: There was trivial regurgitation.  - Pulmonary arteries: Systolic pressure was at the upper limits of    normal. PA peak pressure: 31 mm Hg (S).  - Inferior vena cava: The vessel was normal in size.  - Pericardium, extracardiac: There was no pericardial effusion.   EKG:   06/08/22: NSR, IVCD, HR 70  Recent Labs: 01/13/2022: TSH 0.48 05/08/2022: ALT 14; BUN 19; Creatinine 0.95; Hemoglobin 13.5; Platelet Count 194; Potassium 4.0; Sodium 140  Recent Lipid Panel    Component Value Date/Time   CHOL 129 01/13/2022 1427   CHOL 155 09/18/2016 0826   TRIG 113.0 01/13/2022 1427   HDL 35.90 (L) 01/13/2022 1427   HDL 41 09/18/2016 0826   CHOLHDL 4 01/13/2022 1427   VLDL 22.6 01/13/2022 1427   LDLCALC 71 01/13/2022 1427   LDLCALC 100 (H) 09/18/2016 0826     Physical Exam:    VS:  BP 120/64   Pulse 70   Ht 5' 10.5" (1.791 m)   Wt 177 lb 12.8 oz (80.6 kg)   SpO2 92%   BMI 25.15 kg/m     Wt Readings from Last 3 Encounters:  06/08/22 177 lb 12.8 oz (80.6 kg)  05/08/22 169 lb (76.7 kg)  01/13/22 183 lb (83 kg)     GEN:  Well nourished, well developed in no acute distress HEENT: Normal NECK: No JVD CARDIAC: RRR, 2/6 systolic murmur, no rubs or gallops RESPIRATORY:  Clear to auscultation without rales, wheezing or rhonchi  ABDOMEN: Soft, non-tender, non-distended MUSCULOSKELETAL:  No edema; No deformity  SKIN: Warm and dry NEUROLOGIC:  Alert and oriented x 3 PSYCHIATRIC:  Normal affect   ASSESSMENT:    1. Essential hypertension   2. Hyperlipidemia, unspecified hyperlipidemia type   3. Hereditary hemochromatosis (HCC)   4. LBBB (left bundle branch block)     PLAN:    In order of problems listed above:  #HTN: Well controlled at home. -Continue Losartan 50mg  daily -Continue Metop 50mg  XL daily  #HLD: Controlled with LDL 71 in  12/2021 -Continue red yeast rice -Continue lifestyle modifications  #History of hemochromatosis: LVEF 60-65%. No evidence of cardiac involvement. -Continue to monitor  #Chronic LBBB: Stable. TTE 04/2020 with normal BiV function which appears stable on TTE today -Continue serial monitoring with echoes     Medication Adjustments/Labs and Tests Ordered: Current medicines are reviewed at length with the patient today.  Concerns regarding medicines are outlined above.  Orders Placed This Encounter  Procedures   EKG 12-Lead   No orders of the defined types were placed in this encounter.   Patient Instructions  Medication Instructions:   Your physician recommends that you continue on your current medications as directed. Please refer to the Current Medication list given to you today.  *If you need a refill on your cardiac medications before your next appointment, please call your pharmacy*    Follow-Up: At [facility], you and your health needs are our priority.  As part of our continuing mission to provide you with exceptional heart care, we have created designated Provider Care Teams.  These Care Teams include your primary Cardiologist (physician) and Advanced Practice Providers (APPs -  Physician Assistants and Nurse Practitioners) who all work together to provide you with the care you need, when you need it.  We recommend signing up  for the patient portal called "MyChart".  Sign up information is provided on this After Visit Summary.  MyChart is used to connect with patients for Virtual Visits (Telemedicine).  Patients are able to view lab/test results, encounter notes, upcoming appointments, etc.  Non-urgent messages can be sent to your provider as well.   To learn more about what you can do with MyChart, go to [email or web address].    Your next appointment:   1 year(s)  Provider:   DR. Shari Prows    Signed, Meriam Sprague, MD  06/08/2022 11:35 AM    [facility]

## 2022-06-03 ENCOUNTER — Telehealth: Payer: Self-pay | Admitting: Internal Medicine

## 2022-06-03 NOTE — Telephone Encounter (Signed)
Contacted Preston Reeves to schedule their annual wellness visit. Appointment made for 06/15/2022.  [facility] Care Guide [facility] AWV TEAM Direct Dial: [phone]

## 2022-06-08 ENCOUNTER — Ambulatory Visit (HOSPITAL_BASED_OUTPATIENT_CLINIC_OR_DEPARTMENT_OTHER): Payer: PPO

## 2022-06-08 ENCOUNTER — Ambulatory Visit: Payer: PPO | Attending: Cardiology | Admitting: Cardiology

## 2022-06-08 ENCOUNTER — Encounter: Payer: Self-pay | Admitting: Cardiology

## 2022-06-08 VITALS — BP 120/64 | HR 70 | Ht 70.5 in | Wt 177.8 lb

## 2022-06-08 DIAGNOSIS — I10 Essential (primary) hypertension: Secondary | ICD-10-CM

## 2022-06-08 DIAGNOSIS — I447 Left bundle-branch block, unspecified: Secondary | ICD-10-CM

## 2022-06-08 DIAGNOSIS — E785 Hyperlipidemia, unspecified: Secondary | ICD-10-CM | POA: Diagnosis not present

## 2022-06-08 LAB — ECHOCARDIOGRAM COMPLETE
Area-P 1/2: 2.56 cm2
S' Lateral: 2.7 cm

## 2022-06-08 NOTE — Patient Instructions (Signed)
Medication Instructions:   Your physician recommends that you continue on your current medications as directed. Please refer to the Current Medication list given to you today.  *If you need a refill on your cardiac medications before your next appointment, please call your pharmacy*   Follow-Up: At [facility], you and your health needs are our priority.  As part of our continuing mission to provide you with exceptional heart care, we have created designated Provider Care Teams.  These Care Teams include your primary Cardiologist (physician) and Advanced Practice Providers (APPs -  Physician Assistants and Nurse Practitioners) who all work together to provide you with the care you need, when you need it.  We recommend signing up for the patient portal called "MyChart".  Sign up information is provided on this After Visit Summary.  MyChart is used to connect with patients for Virtual Visits (Telemedicine).  Patients are able to view lab/test results, encounter notes, upcoming appointments, etc.  Non-urgent messages can be sent to your provider as well.   To learn more about what you can do with MyChart, go to [email or web address].    Your next appointment:   1 year(s)  Provider:   DR. PEMBERTON   

## 2022-06-29 NOTE — Patient Instructions (Incomplete)
Preston Reeves , Thank you for taking time to come for your Medicare Wellness Visit. I appreciate your ongoing commitment to your health goals. Please review the following plan we discussed and let me know if I can assist you in the future.   These are the goals we discussed:  Goals      Remain active and independent        This is a list of the screening recommended for you and due dates:  Health Maintenance  Topic Date Due   Zoster (Shingles) Vaccine (1 of 2) Never done   COVID-19 Vaccine (3 - Pfizer risk series) 06/14/2019   Flu Shot  08/27/2022   DTaP/Tdap/Td vaccine (2 - Tdap) 06/21/2023   Medicare Annual Wellness Visit  06/30/2023   Colon Cancer Screening  10/11/2023   Pneumonia Vaccine  Completed   HPV Vaccine  Aged Out    Advanced directives: Information on Advanced Care Planning can be found at [facility] Advance Health Care Directives Advance Health Care Directives ([email or web address]  Please bring a copy of your health care power of attorney and living will to the office to be added to your chart at your convenience.   Conditions/risks identified: Aim for 30 minutes of exercise or brisk walking, 6-8 glasses of water, and 5 servings of fruits and vegetables each day.  Next appointment: Follow up in one year for your annual wellness visit.   Preventive Care 65 Years and Older, Male  Preventive care refers to lifestyle choices and visits with your health care provider that can promote health and wellness. What does preventive care include? A yearly physical exam. This is also called an annual well check. Dental exams once or twice a year. Routine eye exams. Ask your health care provider how often you should have your eyes checked. Personal lifestyle choices, including: Daily care of your teeth and gums. Regular physical activity. Eating a healthy diet. Avoiding tobacco and drug use. Limiting alcohol use. Practicing safe sex. Taking low doses of aspirin  every day. Taking vitamin and mineral supplements as recommended by your health care provider. What happens during an annual well check? The services and screenings done by your health care provider during your annual well check will depend on your age, overall health, lifestyle risk factors, and family history of disease. Counseling  Your health care provider may ask you questions about your: Alcohol use. Tobacco use. Drug use. Emotional well-being. Home and relationship well-being. Sexual activity. Eating habits. History of falls. Memory and ability to understand (cognition). Work and work Astronomer. Screening  You may have the following tests or measurements: Height, weight, and BMI. Blood pressure. Lipid and cholesterol levels. These may be checked every 5 years, or more frequently if you are over 85 years old. Skin check. Lung cancer screening. You may have this screening every year starting at age 53 if you have a 30-pack-year history of smoking and currently smoke or have quit within the past 15 years. Fecal occult blood test (FOBT) of the stool. You may have this test every year starting at age 71. Flexible sigmoidoscopy or colonoscopy. You may have a sigmoidoscopy every 5 years or a colonoscopy every 10 years starting at age 45. Prostate cancer screening. Recommendations will vary depending on your family history and other risks. Hepatitis C blood test. Hepatitis B blood test. Sexually transmitted disease (STD) testing. Diabetes screening. This is done by checking your blood sugar (glucose) after you have not eaten for a while (  fasting). You may have this done every 1-3 years. Abdominal aortic aneurysm (AAA) screening. You may need this if you are a current or former smoker. Osteoporosis. You may be screened starting at age 48 if you are at high risk. Talk with your health care provider about your test results, treatment options, and if necessary, the need for more  tests. Vaccines  Your health care provider may recommend certain vaccines, such as: Influenza vaccine. This is recommended every year. Tetanus, diphtheria, and acellular pertussis (Tdap, Td) vaccine. You may need a Td booster every 10 years. Zoster vaccine. You may need this after age 6. Pneumococcal 13-valent conjugate (PCV13) vaccine. One dose is recommended after age 36. Pneumococcal polysaccharide (PPSV23) vaccine. One dose is recommended after age 68. Talk to your health care provider about which screenings and vaccines you need and how often you need them. This information is not intended to replace advice given to you by your health care provider. Make sure you discuss any questions you have with your health care provider. Document Released: 02/08/2015 Document Revised: 10/02/2015 Document Reviewed: 11/13/2014 Elsevier Interactive Patient Education  2017 [employer].  Fall Prevention in the Home Falls can cause injuries. They can happen to people of all ages. There are many things you can do to make your home safe and to help prevent falls. What can I do on the outside of my home? Regularly fix the edges of walkways and driveways and fix any cracks. Remove anything that might make you trip as you walk through a door, such as a raised step or threshold. Trim any bushes or trees on the path to your home. Use bright outdoor lighting. Clear any walking paths of anything that might make someone trip, such as rocks or tools. Regularly check to see if handrails are loose or broken. Make sure that both sides of any steps have handrails. Any raised decks and porches should have guardrails on the edges. Have any leaves, snow, or ice cleared regularly. Use sand or salt on walking paths during winter. Clean up any spills in your garage right away. This includes oil or grease spills. What can I do in the bathroom? Use night lights. Install grab bars by the toilet and in the tub and shower.  Do not use towel bars as grab bars. Use non-skid mats or decals in the tub or shower. If you need to sit down in the shower, use a plastic, non-slip stool. Keep the floor dry. Clean up any water that spills on the floor as soon as it happens. Remove soap buildup in the tub or shower regularly. Attach bath mats securely with double-sided non-slip rug tape. Do not have throw rugs and other things on the floor that can make you trip. What can I do in the bedroom? Use night lights. Make sure that you have a light by your bed that is easy to reach. Do not use any sheets or blankets that are too big for your bed. They should not hang down onto the floor. Have a firm chair that has side arms. You can use this for support while you get dressed. Do not have throw rugs and other things on the floor that can make you trip. What can I do in the kitchen? Clean up any spills right away. Avoid walking on wet floors. Keep items that you use a lot in easy-to-reach places. If you need to reach something above you, use a strong step stool that has a grab bar.  Keep electrical cords out of the way. Do not use floor polish or wax that makes floors slippery. If you must use wax, use non-skid floor wax. Do not have throw rugs and other things on the floor that can make you trip. What can I do with my stairs? Do not leave any items on the stairs. Make sure that there are handrails on both sides of the stairs and use them. Fix handrails that are broken or loose. Make sure that handrails are as long as the stairways. Check any carpeting to make sure that it is firmly attached to the stairs. Fix any carpet that is loose or worn. Avoid having throw rugs at the top or bottom of the stairs. If you do have throw rugs, attach them to the floor with carpet tape. Make sure that you have a light switch at the top of the stairs and the bottom of the stairs. If you do not have them, ask someone to add them for you. What else  can I do to help prevent falls? Wear shoes that: Do not have high heels. Have rubber bottoms. Are comfortable and fit you well. Are closed at the toe. Do not wear sandals. If you use a stepladder: Make sure that it is fully opened. Do not climb a closed stepladder. Make sure that both sides of the stepladder are locked into place. Ask someone to hold it for you, if possible. Clearly mark and make sure that you can see: Any grab bars or handrails. First and last steps. Where the edge of each step is. Use tools that help you move around (mobility aids) if they are needed. These include: Canes. Walkers. Scooters. Crutches. Turn on the lights when you go into a dark area. Replace any light bulbs as soon as they burn out. Set up your furniture so you have a clear path. Avoid moving your furniture around. If any of your floors are uneven, fix them. If there are any pets around you, be aware of where they are. Review your medicines with your doctor. Some medicines can make you feel dizzy. This can increase your chance of falling. Ask your doctor what other things that you can do to help prevent falls. This information is not intended to replace advice given to you by your health care provider. Make sure you discuss any questions you have with your health care provider. Document Released: 11/08/2008 Document Revised: 06/20/2015 Document Reviewed: 02/16/2014 Elsevier Interactive Patient Education  2017 [employer].

## 2022-06-29 NOTE — Progress Notes (Unsigned)
Subjective:   Preston Reeves is a 82 y.o. male who presents for Medicare Annual/Subsequent preventive examination.  Review of Systems    ***       Objective:    There were no vitals filed for this visit. There is no height or weight on file to calculate BMI.     05/08/2022   10:35 AM 11/17/2021   11:34 AM 05/07/2021   11:26 AM 12/13/2020   10:18 AM 05/10/2019   10:55 AM 12/27/2017   10:52 AM 12/28/2016    9:39 AM  Advanced Directives  Does Patient Have a Medical Advance Directive? Yes Yes Yes Yes No No No  Type of Estate agent of [place];Living will Healthcare Power of [place];Living will Living will;Healthcare Power of Attorney Living will;Healthcare Power of Attorney     Does patient want to make changes to medical advance directive? No - Patient declined  No - Patient declined No - Patient declined     Copy of Healthcare Power of Attorney in Chart? No - copy requested No - copy requested No - copy requested No - copy requested     Would patient like information on creating a medical advance directive? No - Patient declined  No - Patient declined  No - Patient declined  No - Patient declined    Current Medications (verified) Outpatient Encounter Medications as of 06/30/2022  Medication Sig   acetaminophen (TYLENOL) 500 MG tablet Take 500 mg by mouth every 6 (six) hours as needed for mild pain.   albuterol (VENTOLIN HFA) 108 (90 Base) MCG/ACT inhaler INHALE 1 TO 2 PUFFS INTO THE LUNGS EVERY 6 HOURS AS NEEDED FOR WHEEZING OR SHORTNESS OF BREATH   ANORO ELLIPTA 62.5-25 MCG/ACT AEPB INHALE 1 PUFF INTO THE LUNGS DAILY AT 6 AM   Ascorbic Acid (VITAMIN C) 1000 MG tablet Take 1,000 mg by mouth daily.   aspirin 81 MG tablet Take 81 mg by mouth daily.   Calcium Carbonate-Vitamin D (CALCIUM + D PO) Take 1 tablet by mouth daily.    Cholecalciferol (VITAMIN D) 2000 UNITS CAPS Take 2,000 Units by mouth daily.   Docusate Sodium (COLACE PO) Take 200 mg by mouth in the  morning and at bedtime.   fexofenadine (ALLEGRA) 180 MG tablet Take 180 mg by mouth daily.   FIBER PO Take by mouth in the morning and at bedtime.   fluticasone (FLONASE) 50 MCG/ACT nasal spray Place 2 sprays into both nostrils daily.   Glucosamine-Chondroit-Vit C-Mn (GLUCOSAMINE 1500 COMPLEX PO) Take by mouth in the morning and at bedtime.   HYDROcodone-acetaminophen (NORCO/VICODIN) 5-325 MG tablet Take 1-2 tablets by mouth every 6 (six) hours as needed for moderate pain.   losartan (COZAAR) 50 MG tablet Take 1 tablet (50 mg total) by mouth 2 (two) times daily.   metoprolol succinate (TOPROL-XL) 50 MG 24 hr tablet TAKE 1 TABLET(50 MG) BY MOUTH DAILY WITH OR IMMEDIATELY FOLLOWING A MEAL   Multiple Vitamins-Minerals (MULTIVITAMIN PO) Take 1 tablet by mouth daily.    Omega-3 Fatty Acids (FISH OIL PO) Take 1,000 mg by mouth in the morning and at bedtime.   Red Yeast Rice 600 MG CAPS Take 1,200 mg by mouth daily.    SALINE NASAL SPRAY NA Place 1 spray into the nose daily as needed (allergies).    SYNTHROID 150 MCG tablet TAKE 1 TABLET(150 MCG) BY MOUTH DAILY   vitamin B-12 (CYANOCOBALAMIN) 1000 MCG tablet Take 1,000 mcg by mouth daily.   No facility-administered  encounter medications on file as of 06/30/2022.    Allergies (verified) Patient has no known allergies.   History: Past Medical History:  Diagnosis Date   Allergy    SEASONAL   Asthma    Cataract    Colon polyps    Adenomatous   COPD (chronic obstructive pulmonary disease) (HCC)    Diverticulitis of colon with perforation 2010   Colectomy/ostomy   Diverticulosis    hx of   Dysrhythmia    Hearing aid worn    bilaterally   Heart murmur    Hemochromatosis    hx of   History of BPH    History of COPD    Hyperlipidemia    Hypertension    Hypothyroidism    Incisional hernia    MVP (mitral valve prolapse)    Thyroid disease    Past Surgical History:  Procedure Laterality Date   COLONOSCOPY  last 09/25/2013   COLONOSCOPY  W/ BIOPSIES  2011   Dr Marina Goodell   COLOSTOMY TAKEDOWN  2010   Dr Donell Beers   GOLD SEED IMPLANT N/A 11/17/2021   Procedure: GOLD SEED IMPLANT;  Surgeon: Heloise Purpura, MD;  Location[facility];  Service: Urology;  Laterality: N/A;  15 MINUTES NEEDED FOR CASE   INGUINAL HERNIA REPAIR Left 1999   Dr. Maryagnes Amos   INSERTION OF MESH N/A 10/13/2013   Procedure: INSERTION OF MESH;  Surgeon: Karie Soda, MD;  Location: [facility] OR;  Service: General;  Laterality: N/A;   LAPAROSCOPIC LYSIS OF ADHESIONS N/A 10/13/2013   Procedure: LAPAROSCOPIC LYSIS OF ADHESIONS;  Surgeon: Karie Soda, MD;  Location: [facility] OR;  Service: General;  Laterality: N/A;   PARTIAL COLECTOMY  08/12/2008   Hartmann; perforated colon from diverticulitis   SPACE OAR INSTILLATION N/A 11/17/2021   Procedure: SPACE OAR INSTILLATION;  Surgeon: Heloise Purpura, MD;  Location: [facility];  Service: Urology;  Laterality: N/A;   UPPER GI ENDOSCOPY     VENTRAL HERNIA REPAIR N/A 10/13/2013   Procedure: REPAIR OF VENTRAL WALL HERNIA WITH UNILATERAL COMPARTMENT SEPARATION;  Surgeon: Karie Soda, MD;  Location: [facility] OR;  Service: General;  Laterality: N/A;   Family History  Problem Relation Age of Onset   Breast cancer Mother 80 - 56   Colon cancer Father 73 - 26   Heart disease Father    Diabetes Father    Heart attack Father    Stroke Neg Hx    Esophageal cancer Neg Hx    Rectal cancer Neg Hx    Stomach cancer Neg Hx    Colon polyps Neg Hx    Social History   Socioeconomic History   Marital status: Married    Spouse name: Not on file   Number of children: Not on file   Years of education: Not on file   Highest education level: Not on file  Occupational History   Occupation: Retired    Associate Professor: RETIRED  Tobacco Use   Smoking status: Former    Types: Cigarettes    Quit date: 2005    Years since quitting: 19.4   Smokeless tobacco: Never  Vaping Use   Vaping Use: Never used  Substance and Sexual Activity    Alcohol use: Yes    Alcohol/week: 7.0 standard drinks of alcohol    Types: 7 Cans of beer per week    Comment: drinks beer weekly   Drug use: No   Sexual activity: Yes  Other Topics Concern   Not on file  Social History Narrative   Married. Education: [employer].   Social Determinants of Health   Financial Resource Strain: Low Risk  (12/13/2020)   Overall Financial Resource Strain (CARDIA)    Difficulty of Paying Living Expenses: Not hard at all  Food Insecurity: No Food Insecurity (12/13/2020)   Hunger Vital Sign    Worried About Running Out of Food in the Last Year: Never true    Ran Out of Food in the Last Year: Never true  Transportation Needs: No Transportation Needs (12/13/2020)   PRAPARE - Administrator, Civil Service (Medical): No    Lack of Transportation (Non-Medical): No  Physical Activity: Sufficiently Active (12/13/2020)   Exercise Vital Sign    Days of Exercise per Week: 5 days    Minutes of Exercise per Session: 30 min  Stress: No Stress Concern Present (12/13/2020)   [employer] of Occupational Health - Occupational Stress Questionnaire    Feeling of Stress : Not at all  Social Connections: Socially Integrated (12/13/2020)   Social Connection and Isolation Panel [NHANES]    Frequency of Communication with Friends and Family: More than three times a week    Frequency of Social Gatherings with Friends and Family: More than three times a week    Attends Religious Services: More than 4 times per year    Active Member of [employer] or Organizations: Yes    Attends Engineer, structural: More than 4 times per year    Marital Status: Married    Tobacco Counseling Counseling given: Not Answered   Clinical Intake:                 Diabetic?No          Activities of Daily Living    11/17/2021   11:37 AM  In your present state of health, do you have any difficulty performing the following activities:  Hearing? 0  Vision? 0   Difficulty concentrating or making decisions? 0  Walking or climbing stairs? 0  Dressing or bathing? 0    Patient Care Team: Pincus Sanes, MD as PCP - General (Internal Medicine) Lars Masson, MD as PCP - Cardiology (Cardiology) Heloise Purpura, MD as Consulting Physician (Urology) Hilarie Fredrickson, MD as Consulting Physician (Gastroenterology) Cassell Clement, MD as Consulting Physician (Cardiology) Szabat, Vinnie Level, [facility] (Inactive) (Pharmacist)  Indicate any recent Medical Services you may have received from other than [facility] providers in the past year (date may be approximate).     Assessment:   This is a routine wellness examination for Leib.  Hearing/Vision screen No results found.  Dietary issues and exercise activities discussed:     Goals Addressed   None    Depression Screen    01/13/2022    1:48 PM 12/13/2020   10:17 AM 12/13/2019   10:26 AM 12/05/2018    7:59 AM 12/03/2017    8:15 AM 11/23/2016    8:10 AM 06/06/2015   11:10 AM  PHQ 2/9 Scores  PHQ - 2 Score 0 0 0 0 0 0 0  PHQ- 9 Score 0    0      Fall Risk    01/13/2022    1:47 PM 12/13/2020   10:05 AM 12/16/2018    1:08 PM 12/05/2018    7:59 AM 12/03/2017    8:15 AM  Fall Risk   Falls in the past year? 0 0 0 0 0  Comment   Emmi Telephone Survey: data to providers prior  to load    Number falls in past yr: 0 0  0 0  Injury with Fall? 0 0     Risk for fall due to : No Fall Risks      Follow up Falls evaluation completed        FALL RISK PREVENTION PERTAINING TO THE HOME:  Any stairs in or around the home? {YES/NO:21197} If so, are there any without handrails? {YES/NO:21197} Home free of loose throw rugs in walkways, pet beds, electrical cords, etc? {YES/NO:21197} Adequate lighting in your home to reduce risk of falls? {YES/NO:21197}  ASSISTIVE DEVICES UTILIZED TO PREVENT FALLS:  Life alert? {YES/NO:21197} Use of a cane, walker or w/c? {YES/NO:21197} Grab bars in the bathroom?  {YES/NO:21197} Shower chair or bench in shower? {YES/NO:21197} Elevated toilet seat or a handicapped toilet? {YES/NO:21197}  TIMED UP AND GO:  Was the test performed? No . Telephonic visit   Cognitive Function:        Immunizations Immunization History  Administered Date(s) Administered   Fluad Quad(high Dose 65+) 12/05/2018, 12/13/2019, 12/13/2020, 01/13/2022   Influenza, High Dose Seasonal PF 09/15/2016, 12/03/2017   Influenza,inj,Quad PF,6+ Mos 10/14/2013   Influenza-Unspecified 12/26/2012, 10/28/2014, 10/06/2015, 09/30/2016   PFIZER(Purple Top)SARS-COV-2 Vaccination 04/15/2019, 05/17/2019   Pneumococcal Conjugate-13 06/07/2014   Pneumococcal Polysaccharide-23 11/27/2011   Td 06/20/2013    TDAP status: Up to date  Pneumococcal vaccine status: Up to date  Covid-19 vaccine status: Information provided on how to obtain vaccines.   Qualifies for Shingles Vaccine? Yes   Zostavax completed No   Shingrix Completed?: No.    Education has been provided regarding the importance of this vaccine. Patient has been advised to call insurance company to determine out of pocket expense if they have not yet received this vaccine. Advised may also receive vaccine at local pharmacy or Health Dept. Verbalized acceptance and understanding.  Screening Tests Health Maintenance  Topic Date Due   Zoster Vaccines- Shingrix (1 of 2) Never done   COVID-19 Vaccine (3 - Pfizer risk series) 06/14/2019   Medicare Annual Wellness (AWV)  12/13/2021   INFLUENZA VACCINE  08/27/2022   DTaP/Tdap/Td (2 - Tdap) 06/21/2023   Colonoscopy  10/11/2023   Pneumonia Vaccine 43+ Years old  Completed   HPV VACCINES  Aged Out    Health Maintenance  Health Maintenance Due  Topic Date Due   Zoster Vaccines- Shingrix (1 of 2) Never done   COVID-19 Vaccine (3 - Pfizer risk series) 06/14/2019   Medicare Annual Wellness (AWV)  12/13/2021    Colorectal cancer screening: Type of screening: Colonoscopy. Completed  10/11/18. Repeat every 5 years  Lung Cancer Screening: (Low Dose CT Chest recommended if Age 6-80 years, 30 pack-year currently smoking OR have quit w/in 15years.) does not qualify.   Lung Cancer Screening Referral: n/a  Additional Screening:  Hepatitis C Screening: does qualify; Patient declines  Vision Screening: Recommended annual ophthalmology exams for early detection of glaucoma and other disorders of the eye. Is the patient up to date with their annual eye exam?  {YES/NO:21197} Who is the provider or what is the name of the office in which the patient attends annual eye exams? *** If pt is not established with a provider, would they like to be referred to a provider to establish care? {YES/NO:21197}.   Dental Screening: Recommended annual dental exams for proper oral hygiene  Community Resource Referral / Chronic Care Management: CRR required this visit?  {YES/NO:21197}  CCM required this visit?  {YES/NO:21197}  Plan:     I have personally reviewed and noted the following in the patient's chart:   Medical and social history Use of alcohol, tobacco or illicit drugs  Current medications and supplements including opioid prescriptions. {Opioid Prescriptions:[phone]} Functional ability and status Nutritional status Physical activity Advanced directives List of other physicians Hospitalizations, surgeries, and ER visits in previous 12 months Vitals Screenings to include cognitive, depression, and falls Referrals and appointments  In addition, I have reviewed and discussed with patient certain preventive protocols, quality metrics, and best practice recommendations. A written personalized care plan for preventive services as well as general preventive health recommendations were provided to patient.     Durwin Nora, [place]   01/31/1094   Due to this being a virtual visit, the after visit summary with patients personalized plan was offered to patient via  mail or my-chart. ***Patient declined at this time./ Patient would like to access on my-chart/ per request, patient was mailed a copy of AVS./ Patient preferred to pick up at office at next visit  Nurse Notes: ***

## 2022-06-30 ENCOUNTER — Ambulatory Visit (INDEPENDENT_AMBULATORY_CARE_PROVIDER_SITE_OTHER): Payer: PPO

## 2022-06-30 VITALS — Ht 70.5 in | Wt 177.0 lb

## 2022-06-30 DIAGNOSIS — Z0000 Encounter for general adult medical examination without abnormal findings: Secondary | ICD-10-CM | POA: Diagnosis not present

## 2022-07-03 ENCOUNTER — Other Ambulatory Visit: Payer: Self-pay | Admitting: Internal Medicine

## 2022-08-03 ENCOUNTER — Other Ambulatory Visit: Payer: Self-pay | Admitting: Hematology & Oncology

## 2022-08-04 ENCOUNTER — Encounter: Payer: Self-pay | Admitting: Family

## 2022-08-04 ENCOUNTER — Other Ambulatory Visit: Payer: Self-pay | Admitting: *Deleted

## 2022-08-04 DIAGNOSIS — I10 Essential (primary) hypertension: Secondary | ICD-10-CM

## 2022-08-04 MED ORDER — LOSARTAN POTASSIUM 50 MG PO TABS
50.0000 mg | ORAL_TABLET | Freq: Two times a day (BID) | ORAL | 3 refills | Status: DC
Start: 2022-08-04 — End: 2023-07-22

## 2022-10-02 DIAGNOSIS — H6123 Impacted cerumen, bilateral: Secondary | ICD-10-CM | POA: Diagnosis not present

## 2022-10-02 DIAGNOSIS — H903 Sensorineural hearing loss, bilateral: Secondary | ICD-10-CM | POA: Diagnosis not present

## 2022-10-03 ENCOUNTER — Other Ambulatory Visit: Payer: Self-pay | Admitting: Internal Medicine

## 2022-11-04 DIAGNOSIS — C61 Malignant neoplasm of prostate: Secondary | ICD-10-CM | POA: Diagnosis not present

## 2022-11-05 ENCOUNTER — Other Ambulatory Visit: Payer: Self-pay | Admitting: Family Medicine

## 2022-11-11 DIAGNOSIS — C61 Malignant neoplasm of prostate: Secondary | ICD-10-CM | POA: Diagnosis not present

## 2022-12-15 ENCOUNTER — Ambulatory Visit (INDEPENDENT_AMBULATORY_CARE_PROVIDER_SITE_OTHER): Payer: Self-pay | Admitting: Audiology

## 2022-12-15 NOTE — Progress Notes (Signed)
Patient was seen today for a hearing aid check. His right receiver was bent near the wax filter. The receiver was still able to work correctly. Discussed how to change the wax filter and dome to ensure the sound quality is good. Instructed patient to call if he would like a new receiver. He reported the hearing aids were working well and he did not need any software changes at this time. Instructed him to call if any concerns arise.   Preston Reeves Samon Dishner, AUD, CCC-A 12/15/22

## 2022-12-21 ENCOUNTER — Ambulatory Visit (INDEPENDENT_AMBULATORY_CARE_PROVIDER_SITE_OTHER): Payer: PPO

## 2022-12-21 DIAGNOSIS — Z23 Encounter for immunization: Secondary | ICD-10-CM | POA: Diagnosis not present

## 2022-12-29 ENCOUNTER — Other Ambulatory Visit: Payer: Self-pay | Admitting: Internal Medicine

## 2023-01-05 NOTE — Progress Notes (Unsigned)
No chief complaint on file.  History of Present Illness: 82 yo male with history of COPD, HTN, HLD, mitral valve prolapse, hemochromatosis, hypothyroidism with prior thyrotoxicosis s/p RAI who is here today for follow up. He has been followed in our office by Dr. Shari Prows. Echo May 2024 with LVEF=60-65%, grade 1 diastolic dysfunction, trivial MR.   He is here today for follow up. The patient denies any chest pain, dyspnea, palpitations, lower extremity edema, orthopnea, PND, dizziness, near syncope or syncope.    Primary Care Physician: Pincus Sanes, MD   Past Medical History:  Diagnosis Date   Allergy    SEASONAL   Asthma    Cataract    Colon polyps    Adenomatous   COPD (chronic obstructive pulmonary disease) (HCC)    Diverticulitis of colon with perforation 2010   Colectomy/ostomy   Diverticulosis    hx of   Dysrhythmia    Hearing aid worn    bilaterally   Heart murmur    Hemochromatosis    hx of   History of BPH    History of COPD    Hyperlipidemia    Hypertension    Hypothyroidism    Incisional hernia    MVP (mitral valve prolapse)    Thyroid disease     Past Surgical History:  Procedure Laterality Date   COLONOSCOPY  last 09/25/2013   COLONOSCOPY W/ BIOPSIES  2011   Dr Marina Goodell   COLOSTOMY TAKEDOWN  2010   Dr Donell Beers   GOLD SEED IMPLANT N/A 11/17/2021   Procedure: GOLD SEED IMPLANT;  Surgeon: Heloise Purpura, MD;  Location: [facility];  Service: Urology;  Laterality: N/A;  15 MINUTES NEEDED FOR CASE   INGUINAL HERNIA REPAIR Left 1999   Dr. Maryagnes Amos   INSERTION OF MESH N/A 10/13/2013   Procedure: INSERTION OF MESH;  Surgeon: Karie Soda, MD;  Location: [facility] OR;  Service: General;  Laterality: N/A;   LAPAROSCOPIC LYSIS OF ADHESIONS N/A 10/13/2013   Procedure: LAPAROSCOPIC LYSIS OF ADHESIONS;  Surgeon: Karie Soda, MD;  Location: [facility] OR;  Service: General;  Laterality: N/A;   PARTIAL COLECTOMY  08/12/2008   Hartmann; perforated colon from  diverticulitis   SPACE OAR INSTILLATION N/A 11/17/2021   Procedure: SPACE OAR INSTILLATION;  Surgeon: Heloise Purpura, MD;  Location[facility];  Service: Urology;  Laterality: N/A;   UPPER GI ENDOSCOPY     VENTRAL HERNIA REPAIR N/A 10/13/2013   Procedure: REPAIR OF VENTRAL WALL HERNIA WITH UNILATERAL COMPARTMENT SEPARATION;  Surgeon: Karie Soda, MD;  Location: [facility] OR;  Service: General;  Laterality: N/A;    Current Outpatient Medications  Medication Sig Dispense Refill   acetaminophen (TYLENOL) 500 MG tablet Take 500 mg by mouth every 6 (six) hours as needed for mild pain.     albuterol (VENTOLIN HFA) 108 (90 Base) MCG/ACT inhaler INHALE 1 TO 2 PUFFS INTO THE LUNGS EVERY 6 HOURS AS NEEDED FOR WHEEZING OR SHORTNESS OF BREATH 18 g 8   ANORO ELLIPTA 62.5-25 MCG/ACT AEPB INHALE 1 PUFF INTO THE LUNGS DAILY AT 6 AM 60 each 2   Ascorbic Acid (VITAMIN C) 1000 MG tablet Take 1,000 mg by mouth daily.     aspirin 81 MG tablet Take 81 mg by mouth daily.     Calcium Carbonate-Vitamin D (CALCIUM + D PO) Take 1 tablet by mouth daily.      Cholecalciferol (VITAMIN D) 2000 UNITS CAPS Take 2,000 Units by mouth daily.  Docusate Sodium (COLACE PO) Take 200 mg by mouth in the morning and at bedtime.     fexofenadine (ALLEGRA) 180 MG tablet Take 180 mg by mouth daily.     FIBER PO Take by mouth in the morning and at bedtime.     fluticasone (FLONASE) 50 MCG/ACT nasal spray Place 2 sprays into both nostrils daily. 16 g 6   Glucosamine-Chondroit-Vit C-Mn (GLUCOSAMINE 1500 COMPLEX PO) Take by mouth in the morning and at bedtime.     losartan (COZAAR) 50 MG tablet Take 1 tablet (50 mg total) by mouth 2 (two) times daily. 180 tablet 3   metoprolol succinate (TOPROL-XL) 50 MG 24 hr tablet TAKE 1 TABLET(50 MG) BY MOUTH DAILY WITH OR IMMEDIATELY FOLLOWING A MEAL 90 tablet 0   Multiple Vitamins-Minerals (MULTIVITAMIN PO) Take 1 tablet by mouth daily.      Omega-3 Fatty Acids (FISH OIL PO) Take 1,000 mg  by mouth in the morning and at bedtime.     Red Yeast Rice 600 MG CAPS Take 1,200 mg by mouth daily.      SALINE NASAL SPRAY NA Place 1 spray into the nose daily as needed (allergies).      SYNTHROID 150 MCG tablet TAKE 1 TABLET(150 MCG) BY MOUTH DAILY 90 tablet 3   vitamin B-12 (CYANOCOBALAMIN) 1000 MCG tablet Take 1,000 mcg by mouth daily.     No current facility-administered medications for this visit.    No Known Allergies  Social History   Socioeconomic History   Marital status: Married    Spouse name: Not on file   Number of children: Not on file   Years of education: Not on file   Highest education level: Not on file  Occupational History   Occupation: Retired    Associate Professor: RETIRED  Tobacco Use   Smoking status: Former    Current packs/day: 0.00    Types: Cigarettes    Quit date: 2005    Years since quitting: 19.9   Smokeless tobacco: Never  Vaping Use   Vaping status: Never Used  Substance and Sexual Activity   Alcohol use: Yes    Alcohol/week: 7.0 standard drinks of alcohol    Types: 7 Cans of beer per week    Comment: drinks beer weekly   Drug use: No   Sexual activity: Yes  Other Topics Concern   Not on file  Social History Narrative   Married. Education: [employer].   Social Determinants of Health   Financial Resource Strain: Low Risk  (06/30/2022)   Overall Financial Resource Strain (CARDIA)    Difficulty of Paying Living Expenses: Not hard at all  Food Insecurity: No Food Insecurity (06/30/2022)   Hunger Vital Sign    Worried About Running Out of Food in the Last Year: Never true    Ran Out of Food in the Last Year: Never true  Transportation Needs: No Transportation Needs (06/30/2022)   PRAPARE - Administrator, Civil Service (Medical): No    Lack of Transportation (Non-Medical): No  Physical Activity: Sufficiently Active (06/30/2022)   Exercise Vital Sign    Days of Exercise per Week: 5 days    Minutes of Exercise per Session: 30 min  Stress:  No Stress Concern Present (06/30/2022)   [employer] of Occupational Health - Occupational Stress Questionnaire    Feeling of Stress : Not at all  Social Connections: Socially Integrated (06/30/2022)   Social Connection and Isolation Panel [NHANES]    Frequency of Communication  with Friends and Family: More than three times a week    Frequency of Social Gatherings with Friends and Family: Three times a week    Attends Religious Services: More than 4 times per year    Active Member of Clubs or Organizations: Yes    Attends Banker Meetings: More than 4 times per year    Marital Status: Married  Catering manager Violence: Not At Risk (06/30/2022)   Humiliation, Afraid, Rape, and Kick questionnaire    Fear of Current or Ex-Partner: No    Emotionally Abused: No    Physically Abused: No    Sexually Abused: No    Family History  Problem Relation Age of Onset   Breast cancer Mother 72 - 81   Colon cancer Father 75 - 57   Heart disease Father    Diabetes Father    Heart attack Father    Stroke Neg Hx    Esophageal cancer Neg Hx    Rectal cancer Neg Hx    Stomach cancer Neg Hx    Colon polyps Neg Hx     Review of Systems:  As stated in the HPI and otherwise negative.   There were no vitals taken for this visit.  Physical Examination: General: Well developed, well nourished, NAD  HEENT: OP clear, mucus membranes moist  SKIN: warm, dry. No rashes. Neuro: No focal deficits  Musculoskeletal: Muscle strength 5/5 all ext  Psychiatric: Mood and affect normal  Neck: No JVD, no carotid bruits, no thyromegaly, no lymphadenopathy.  Lungs:Clear bilaterally, no wheezes, rhonci, crackles Cardiovascular: Regular rate and rhythm. No murmurs, gallops or rubs. Abdomen:Soft. Bowel sounds present. Non-tender.  Extremities: No lower extremity edema. Pulses are 2 + in the bilateral DP/PT.  EKG:  EKG {ACTION; IS/IS GUY:[id number]} ordered today. The ekg ordered today demonstrates  ***  Recent Labs: 01/13/2022: TSH 0.48 05/08/2022: ALT 14; BUN 19; Creatinine 0.95; Hemoglobin 13.5; Platelet Count 194; Potassium 4.0; Sodium 140   Lipid Panel    Component Value Date/Time   CHOL 129 01/13/2022 1427   CHOL 155 09/18/2016 0826   TRIG 113.0 01/13/2022 1427   HDL 35.90 (L) 01/13/2022 1427   HDL 41 09/18/2016 0826   CHOLHDL 4 01/13/2022 1427   VLDL 22.6 01/13/2022 1427   LDLCALC 71 01/13/2022 1427   LDLCALC 100 (H) 09/18/2016 0826     Wt Readings from Last 3 Encounters:  06/30/22 80.3 kg  06/08/22 80.6 kg  05/08/22 76.7 kg    Assessment and Plan:   1. Chronic LBBB: No dizziness. LV function normal by echo in May 2024.   2. HTN: BP is controlled. Continue current therapy  3. HLD: LDL 71 in December 2023. Continue red yeast rice. *** check lipids and LFTs today  Labs/ tests ordered today include:  No orders of the defined types were placed in this encounter.  Disposition:   F/U with me in ***   Signed, Verne Carrow, MD, [facility] 01/05/2023 1:57 PM    [facility] [address] [place], [place], [place]  [address] Phone: [phone]; Fax: [phone]

## 2023-01-06 ENCOUNTER — Ambulatory Visit: Payer: PPO | Attending: Cardiovascular Disease | Admitting: Cardiovascular Disease

## 2023-01-06 ENCOUNTER — Encounter: Payer: Self-pay | Admitting: Cardiovascular Disease

## 2023-01-06 VITALS — BP 126/62 | HR 81 | Ht 70.5 in | Wt 185.2 lb

## 2023-01-06 DIAGNOSIS — I447 Left bundle-branch block, unspecified: Secondary | ICD-10-CM

## 2023-01-06 DIAGNOSIS — E785 Hyperlipidemia, unspecified: Secondary | ICD-10-CM

## 2023-01-06 DIAGNOSIS — I10 Essential (primary) hypertension: Secondary | ICD-10-CM

## 2023-01-06 NOTE — Patient Instructions (Signed)
Medication Instructions:  No changes *If you need a refill on your cardiac medications before your next appointment, please call your pharmacy*   Lab Work: none If you have labs (blood work) drawn today and your tests are completely normal, you will receive your results only by: [place] (if you have MyChart) OR A paper copy in the mail If you have any lab test that is abnormal or we need to change your treatment, we will call you to review the results.   Testing/Procedures: None   Follow-Up: At [facility], you and your health needs are our priority.  As part of our continuing mission to provide you with exceptional heart care, we have created designated Provider Care Teams.  These Care Teams include your primary Cardiologist (physician) and Advanced Practice Providers (APPs -  Physician Assistants and Nurse Practitioners) who all work together to provide you with the care you need, when you need it.   Your next appointment:   12 month(s)  Provider:   Lauree Chandler, MD

## 2023-01-07 ENCOUNTER — Ambulatory Visit (INDEPENDENT_AMBULATORY_CARE_PROVIDER_SITE_OTHER): Payer: Self-pay | Admitting: Audiology

## 2023-01-07 DIAGNOSIS — Z719 Counseling, unspecified: Secondary | ICD-10-CM

## 2023-01-07 NOTE — Progress Notes (Signed)
Patient was seen today for a hearing aid check. He currently wears Oticon Zircon 2 RIC hearing aids. He reports the volume is slightly too high on the right hearing aid. Turned his overall gain down 4 steps on the right side. He was happy with the sound volume, clarity, and balance between his ears. Instructed him to call if any concerns arise.  No charge for today's visit.  Conley Rolls Abril Cappiello, AUD, CCC-A 01/07/23

## 2023-01-30 ENCOUNTER — Other Ambulatory Visit: Payer: Self-pay | Admitting: Family Medicine

## 2023-02-01 NOTE — Progress Notes (Signed)
 Subjective:    Patient ID: Preston Reeves, male    DOB: 02-Jun-1940, 83 y.o.   MRN: [id number]     HPI Preston Reeves is here for a physical exam and his chronic medical problems.   Can not stand long periods - has lower stamina.  He is less active.  No regular exercise.     Medications and allergies reviewed with patient and updated if appropriate.  Current Outpatient Medications on File Prior to Visit  Medication Sig Dispense Refill   acetaminophen  (TYLENOL ) 500 MG tablet Take 500 mg by mouth every 6 (six) hours as needed for mild pain.     albuterol  (VENTOLIN  HFA) 108 (90 Base) MCG/ACT inhaler INHALE 1 TO 2 PUFFS INTO THE LUNGS EVERY 6 HOURS AS NEEDED FOR WHEEZING OR SHORTNESS OF BREATH 18 g 8   ANORO ELLIPTA  62.5-25 MCG/ACT AEPB INHALE 1 PUFF INTO THE LUNGS DAILY AT 6 AM 60 each 2   Ascorbic Acid  (VITAMIN C ) 1000 MG tablet Take 1,000 mg by mouth daily.     Calcium  Carbonate-Vitamin D  (CALCIUM  + D PO) Take 1 tablet by mouth daily.      Docusate Sodium  (COLACE PO) Take 200 mg by mouth in the morning and at bedtime.     fexofenadine (ALLEGRA) 180 MG tablet Take 180 mg by mouth daily.     FIBER PO Take by mouth in the morning and at bedtime.     fluticasone  (FLONASE ) 50 MCG/ACT nasal spray Place 2 sprays into both nostrils daily. 16 g 6   Glucosamine-Chondroit-Vit C-Mn (GLUCOSAMINE 1500 COMPLEX PO) Take by mouth in the morning and at bedtime.     losartan  (COZAAR ) 50 MG tablet Take 1 tablet (50 mg total) by mouth 2 (two) times daily. 180 tablet 3   metoprolol  succinate (TOPROL -XL) 50 MG 24 hr tablet TAKE 1 TABLET(50 MG) BY MOUTH DAILY WITH OR IMMEDIATELY FOLLOWING A MEAL 90 tablet 0   Multiple Vitamins-Minerals (MULTIVITAMIN PO) Take 1 tablet by mouth daily.      Omega-3 Fatty Acids (FISH OIL PO) Take 1,000 mg by mouth in the morning and at bedtime.     Red Yeast Rice 600 MG CAPS Take 1,200 mg by mouth daily.      SALINE NASAL SPRAY NA Place 1 spray into the nose daily as needed  (allergies).      SYNTHROID  150 MCG tablet TAKE 1 TABLET(150 MCG) BY MOUTH DAILY 90 tablet 3   vitamin B-12 (CYANOCOBALAMIN) 1000 MCG tablet Take 1,000 mcg by mouth daily.     No current facility-administered medications on file prior to visit.    Review of Systems  Constitutional:  Negative for fever.  Eyes:  Negative for visual disturbance.  Respiratory:  Positive for shortness of breath (chronic - with exertion). Negative for cough and wheezing.   Cardiovascular:  Negative for chest pain, palpitations and leg swelling.  Gastrointestinal:  Negative for abdominal pain, blood in stool, constipation and diarrhea.       No gerd  Genitourinary:  Negative for difficulty urinating.  Musculoskeletal:  Positive for arthralgias (right knee pain). Negative for back pain.  Skin:  Negative for rash.  Neurological:  Negative for dizziness, light-headedness and headaches.  Psychiatric/Behavioral:  Negative for dysphoric mood. The patient is not nervous/anxious.        Objective:   Vitals:   02/02/23 1337  BP: 120/68  Pulse: 74  Temp: 98.3 F (36.8 C)  SpO2: 94%   Filed Weights  02/02/23 1337  Weight: 184 lb (83.5 kg)   Body mass index is 26.03 kg/m.  BP Readings from Last 3 Encounters:  02/02/23 120/68  01/06/23 126/62  06/08/22 120/64    Wt Readings from Last 3 Encounters:  02/02/23 184 lb (83.5 kg)  01/06/23 185 lb 3.2 oz (84 kg)  06/30/22 177 lb (80.3 kg)      Physical Exam Constitutional: He appears well-developed and well-nourished. No distress.  HENT:  Head: Normocephalic and atraumatic.  Right Ear: External ear normal.  Left Ear: External ear normal.  Normal ear canals and TM b/l  Mouth/Throat: Oropharynx is clear and moist. Eyes: Conjunctivae and EOM are normal.  Neck: Neck supple. No tracheal deviation present. No thyromegaly present.  No carotid bruit  Cardiovascular: Normal rate, regular rhythm, normal heart sounds and intact distal pulses.   2/6 sys  murmur heard.  No lower extremity edema. Pulmonary/Chest: Effort normal and breath sounds normal. No respiratory distress. He has no wheezes. He has no rales.  Abdominal: Soft. Scars from prior surgery. He exhibits no distension. There is no tenderness.  Genitourinary: deferred  Lymphadenopathy:   He has no cervical adenopathy.  Skin: Skin is warm and dry. He is not diaphoretic.  Psychiatric: He has a normal mood and affect. His behavior is normal.         Assessment & Plan:   Physical exam: Screening blood work  ordered Exercise   no regular exercise Weight  is good Substance abuse   none   Reviewed recommended immunizations.   Health Maintenance  Topic Date Due   COVID-19 Vaccine (3 - Pfizer risk series) 02/18/2023 (Originally 06/14/2019)   Zoster Vaccines- Shingrix (1 of 2) 05/03/2023 (Originally 02/27/1959)   DTaP/Tdap/Td (2 - Tdap) 06/21/2023   Medicare Annual Wellness (AWV)  06/30/2023   Colonoscopy  10/11/2023   Pneumonia Vaccine 39+ Years old  Completed   INFLUENZA VACCINE  Completed   HPV VACCINES  Aged Out     See Problem List for Assessment and Plan of chronic medical problems.

## 2023-02-01 NOTE — Patient Instructions (Addendum)
 Blood work was ordered.       Medications changes include :   None      Return in about 1 year (around 02/02/2024) for Physical Exam.   Health Maintenance, Male Adopting a healthy lifestyle and getting preventive care are important in promoting health and wellness. Ask your health care provider about: The right schedule for you to have regular tests and exams. Things you can do on your own to prevent diseases and keep yourself healthy. What should I know about diet, weight, and exercise? Eat a healthy diet  Eat a diet that includes plenty of vegetables, fruits, low-fat dairy products, and lean protein. Do not eat a lot of foods that are high in solid fats, added sugars, or sodium. Maintain a healthy weight Body mass index (BMI) is a measurement that can be used to identify possible weight problems. It estimates body fat based on height and weight. Your health care provider can help determine your BMI and help you achieve or maintain a healthy weight. Get regular exercise Get regular exercise. This is one of the most important things you can do for your health. Most adults should: Exercise for at least 150 minutes each week. The exercise should increase your heart rate and make you sweat (moderate-intensity exercise). Do strengthening exercises at least twice a week. This is in addition to the moderate-intensity exercise. Spend less time sitting. Even light physical activity can be beneficial. Watch cholesterol and blood lipids Have your blood tested for lipids and cholesterol at 83 years of age, then have this test every 5 years. You may need to have your cholesterol levels checked more often if: Your lipid or cholesterol levels are high. You are older than 83 years of age. You are at high risk for heart disease. What should I know about cancer screening? Many types of cancers can be detected early and may often be prevented. Depending on your health history and family  history, you may need to have cancer screening at various ages. This may include screening for: Colorectal cancer. Prostate cancer. Skin cancer. Lung cancer. What should I know about heart disease, diabetes, and high blood pressure? Blood pressure and heart disease High blood pressure causes heart disease and increases the risk of stroke. This is more likely to develop in people who have high blood pressure readings or are overweight. Talk with your health care provider about your target blood pressure readings. Have your blood pressure checked: Every 3-5 years if you are 36-96 years of age. Every year if you are 38 years old or older. If you are between the ages of 58 and 59 and are a current or former smoker, ask your health care provider if you should have a one-time screening for abdominal aortic aneurysm (AAA). Diabetes Have regular diabetes screenings. This checks your fasting blood sugar level. Have the screening done: Once every three years after age 71 if you are at a normal weight and have a low risk for diabetes. More often and at a younger age if you are overweight or have a high risk for diabetes. What should I know about preventing infection? Hepatitis B If you have a higher risk for hepatitis B, you should be screened for this virus. Talk with your health care provider to find out if you are at risk for hepatitis B infection. Hepatitis C Blood testing is recommended for: Everyone born from [birth year] through 1965. Anyone with known risk factors for hepatitis C. Sexually  transmitted infections (STIs) You should be screened each year for STIs, including gonorrhea and chlamydia, if: You are sexually active and are younger than 83 years of age. You are older than 83 years of age and your health care provider tells you that you are at risk for this type of infection. Your sexual activity has changed since you were last screened, and you are at increased risk for chlamydia or  gonorrhea. Ask your health care provider if you are at risk. Ask your health care provider about whether you are at high risk for HIV. Your health care provider may recommend a prescription medicine to help prevent HIV infection. If you choose to take medicine to prevent HIV, you should first get tested for HIV. You should then be tested every 3 months for as long as you are taking the medicine. Follow these instructions at home: Alcohol use Do not drink alcohol if your health care provider tells you not to drink. If you drink alcohol: Limit how much you have to 0-2 drinks a day. Know how much alcohol is in your drink. In the [place] one drink equals one 12 oz bottle of beer (355 mL), one 5 oz glass of wine (148 mL), or one 1 oz glass of hard liquor (44 mL). Lifestyle Do not use any products that contain nicotine or tobacco. These products include cigarettes, chewing tobacco, and vaping devices, such as e-cigarettes. If you need help quitting, ask your health care provider. Do not use street drugs. Do not share needles. Ask your health care provider for help if you need support or information about quitting drugs. General instructions Schedule regular health, dental, and eye exams. Stay current with your vaccines. Tell your health care provider if: You often feel depressed. You have ever been abused or do not feel safe at home. Summary Adopting a healthy lifestyle and getting preventive care are important in promoting health and wellness. Follow your health care provider's instructions about healthy diet, exercising, and getting tested or screened for diseases. Follow your health care provider's instructions on monitoring your cholesterol and blood pressure. This information is not intended to replace advice given to you by your health care provider. Make sure you discuss any questions you have with your health care provider. Document Revised: 06/03/2020 Document Reviewed: 06/03/2020 Elsevier  Patient Education  2024 [employer].

## 2023-02-02 ENCOUNTER — Ambulatory Visit: Payer: PPO | Admitting: Internal Medicine

## 2023-02-02 VITALS — BP 120/68 | HR 74 | Temp 98.3°F | Ht 70.5 in | Wt 184.0 lb

## 2023-02-02 DIAGNOSIS — R7303 Prediabetes: Secondary | ICD-10-CM

## 2023-02-02 DIAGNOSIS — I10 Essential (primary) hypertension: Secondary | ICD-10-CM | POA: Diagnosis not present

## 2023-02-02 DIAGNOSIS — C61 Malignant neoplasm of prostate: Secondary | ICD-10-CM

## 2023-02-02 DIAGNOSIS — J449 Chronic obstructive pulmonary disease, unspecified: Secondary | ICD-10-CM | POA: Diagnosis not present

## 2023-02-02 DIAGNOSIS — Z0000 Encounter for general adult medical examination without abnormal findings: Secondary | ICD-10-CM | POA: Diagnosis not present

## 2023-02-02 DIAGNOSIS — E039 Hypothyroidism, unspecified: Secondary | ICD-10-CM

## 2023-02-02 LAB — COMPREHENSIVE METABOLIC PANEL
ALT: 12 U/L (ref 0–53)
AST: 20 U/L (ref 0–37)
Albumin: 4 g/dL (ref 3.5–5.2)
Alkaline Phosphatase: 75 U/L (ref 39–117)
BUN: 19 mg/dL (ref 6–23)
CO2: 28 meq/L (ref 19–32)
Calcium: 9.7 mg/dL (ref 8.4–10.5)
Chloride: 105 meq/L (ref 96–112)
Creatinine, Ser: 0.88 mg/dL (ref 0.40–1.50)
GFR: 79.84 mL/min (ref >60.00)
Glucose, Bld: 91 mg/dL (ref 70–99)
Potassium: 4 meq/L (ref 3.5–5.1)
Sodium: 140 meq/L (ref 135–145)
Total Bilirubin: 0.5 mg/dL (ref 0.2–1.2)
Total Protein: 7.8 g/dL (ref 6.0–8.3)

## 2023-02-02 LAB — CBC WITH DIFFERENTIAL/PLATELET
Basophils Absolute: 0 10*3/uL (ref 0.0–0.1)
Basophils Relative: 0.5 % (ref 0.0–3.0)
Eosinophils Absolute: 0.5 10*3/uL (ref 0.0–0.7)
Eosinophils Relative: 10.2 % — ABNORMAL HIGH (ref 0.0–5.0)
HCT: 38.6 % — ABNORMAL LOW (ref 39.0–52.0)
Hemoglobin: 13.5 g/dL (ref 13.0–17.0)
Lymphocytes Relative: 17.1 % (ref 12.0–46.0)
Lymphs Abs: 0.8 10*3/uL (ref 0.7–4.0)
MCHC: 35.1 g/dL (ref 30.0–36.0)
MCV: 102.3 fL — ABNORMAL HIGH (ref 78.0–100.0)
Monocytes Absolute: 0.6 10*3/uL (ref 0.1–1.0)
Monocytes Relative: 11.8 % (ref 3.0–12.0)
Neutro Abs: 2.9 10*3/uL (ref 1.4–7.7)
Neutrophils Relative %: 60.4 % (ref 43.0–77.0)
Platelets: 233 10*3/uL (ref 150.0–400.0)
RBC: 3.77 Mil/uL — ABNORMAL LOW (ref 4.22–5.81)
RDW: 13 % (ref 11.5–15.5)
WBC: 4.7 10*3/uL (ref 4.0–10.5)

## 2023-02-02 LAB — HEMOGLOBIN A1C: Hgb A1c MFr Bld: 5.7 % (ref 4.6–6.5)

## 2023-02-02 LAB — TSH: TSH: 0.37 u[IU]/mL (ref 0.35–5.50)

## 2023-02-02 NOTE — Assessment & Plan Note (Signed)
 Chronic BP well controlled Continue metoprolol xl 50 mg daily, losartan 50 mg bid Cmp, cbc

## 2023-02-02 NOTE — Assessment & Plan Note (Signed)
 S/p radiation - completed a few days ago - having fatigue Follows with urology

## 2023-02-02 NOTE — Assessment & Plan Note (Signed)
 Chronic SOB with exertion- stable Taking anoro daily which has helped, continue Continue albuterol as needed

## 2023-02-02 NOTE — Assessment & Plan Note (Signed)
Chronic  Clinically euthyroid Check tsh and will titrate med dose if needed Currently taking Synthroid 150 mcg daily

## 2023-02-02 NOTE — Assessment & Plan Note (Signed)
 Chronic Lab Results  Component Value Date   HGBA1C 5.9 01/13/2022   Check a1c Low sugar / carb diet Stressed regular exercise

## 2023-02-07 ENCOUNTER — Other Ambulatory Visit: Payer: Self-pay | Admitting: Internal Medicine

## 2023-02-07 DIAGNOSIS — E890 Postprocedural hypothyroidism: Secondary | ICD-10-CM

## 2023-04-07 ENCOUNTER — Other Ambulatory Visit: Payer: Self-pay | Admitting: Internal Medicine

## 2023-04-08 ENCOUNTER — Other Ambulatory Visit: Payer: Self-pay | Admitting: Internal Medicine

## 2023-04-08 NOTE — Telephone Encounter (Signed)
 Copied from CRM [id number]. Topic: Clinical - Medication Refill >> Apr 08, 2023  3:56 PM [place] A wrote: Most Recent Primary Care Visit:  Provider: Pincus Sanes  Department: [facility]  Visit Type: PHYSICAL  Date: 02/02/2023  Medication: ANORO ELLIPTA 62.5-25 MCG/ACT AEPB  Has the patient contacted their pharmacy? Yes (Agent: If no, request that the patient contact the pharmacy for the refill. If patient does not wish to contact the pharmacy document the reason why and proceed with request.) (Agent: If yes, when and what did the pharmacy advise?)  Is this the correct pharmacy for this prescription? Yes [facility] in [place]  If no, delete pharmacy and type the correct one.  This is the patient's preferred pharmacy:  [facility] #[id number] [place], [place] - [address] AT [facility] OF [facility] & [address] [place] [address] Phone: [phone] Fax: [phone]  [facility] #[id number] [place], [place] - [address] AT [facility] OF [address] [place] [address] Phone: [phone] Fax: [phone]  [facility] DR[facility]ORE #[id number] [place], [place] - [address] AT [facility] OF [place] [address] [place] [address] Phone: [phone] Fax: [phone]   Has the prescription been filled recently? No  Is the patient out of the medication? Yes  Has the patient been seen for an appointment in the last year OR does the patient have an upcoming appointment? Yes  Can we respond through MyChart? Yes  Agent: Please be advised that Rx refills may take up to 3 business days. We ask that you follow-up with your pharmacy.

## 2023-04-11 ENCOUNTER — Other Ambulatory Visit: Payer: Self-pay | Admitting: Internal Medicine

## 2023-04-30 ENCOUNTER — Other Ambulatory Visit: Payer: Self-pay | Admitting: Family Medicine

## 2023-06-30 ENCOUNTER — Encounter: Payer: Self-pay | Admitting: Internal Medicine

## 2023-06-30 NOTE — Progress Notes (Unsigned)
    Subjective:    Patient ID: Preston Reeves, male    DOB: 1940-12-21, 83 y.o.   MRN: [id number]      HPI Dawayne is here for No chief complaint on file.    Fatigue -  Thyroid  concerns    Medications and allergies reviewed with patient and updated if appropriate.  Current Outpatient Medications on File Prior to Visit  Medication Sig Dispense Refill   acetaminophen  (TYLENOL ) 500 MG tablet Take 500 mg by mouth every 6 (six) hours as needed for mild pain.     albuterol  (VENTOLIN  HFA) 108 (90 Base) MCG/ACT inhaler INHALE 1 TO 2 PUFFS INTO THE LUNGS EVERY 6 HOURS AS NEEDED FOR WHEEZING OR SHORTNESS OF BREATH 18 g 8   ANORO ELLIPTA  62.5-25 MCG/ACT AEPB INHALE 1 PUFF INTO THE LUNGS DAILY AT 6 AM 60 each 2   Ascorbic Acid  (VITAMIN C ) 1000 MG tablet Take 1,000 mg by mouth daily.     Calcium  Carbonate-Vitamin D  (CALCIUM  + D PO) Take 1 tablet by mouth daily.      Docusate Sodium  (COLACE PO) Take 200 mg by mouth in the morning and at bedtime.     fexofenadine (ALLEGRA) 180 MG tablet Take 180 mg by mouth daily.     FIBER PO Take by mouth in the morning and at bedtime.     fluticasone  (FLONASE ) 50 MCG/ACT nasal spray Place 2 sprays into both nostrils daily. 16 g 6   Glucosamine-Chondroit-Vit C-Mn (GLUCOSAMINE 1500 COMPLEX PO) Take by mouth in the morning and at bedtime.     losartan  (COZAAR ) 50 MG tablet Take 1 tablet (50 mg total) by mouth 2 (two) times daily. 180 tablet 3   metoprolol  succinate (TOPROL -XL) 50 MG 24 hr tablet TAKE 1 TABLET(50 MG) BY MOUTH DAILY WITH OR IMMEDIATELY FOLLOWING A MEAL 90 tablet 0   Multiple Vitamins-Minerals (MULTIVITAMIN PO) Take 1 tablet by mouth daily.      Omega-3 Fatty Acids (FISH OIL PO) Take 1,000 mg by mouth in the morning and at bedtime.     Red Yeast Rice 600 MG CAPS Take 1,200 mg by mouth daily.      SALINE NASAL SPRAY NA Place 1 spray into the nose daily as needed (allergies).      SYNTHROID  150 MCG tablet TAKE 1 TABLET(150 MCG) BY MOUTH DAILY 90  tablet 3   vitamin B-12 (CYANOCOBALAMIN) 1000 MCG tablet Take 1,000 mcg by mouth daily.     No current facility-administered medications on file prior to visit.    Review of Systems     Objective:  There were no vitals filed for this visit. BP Readings from Last 3 Encounters:  02/02/23 120/68  01/06/23 126/62  06/08/22 120/64   Wt Readings from Last 3 Encounters:  02/02/23 184 lb (83.5 kg)  01/06/23 185 lb 3.2 oz (84 kg)  06/30/22 177 lb (80.3 kg)   There is no height or weight on file to calculate BMI.    Physical Exam         Assessment & Plan:    See Problem List for Assessment and Plan of chronic medical problems.

## 2023-07-01 ENCOUNTER — Ambulatory Visit: Payer: Self-pay | Admitting: Internal Medicine

## 2023-07-01 ENCOUNTER — Ambulatory Visit (INDEPENDENT_AMBULATORY_CARE_PROVIDER_SITE_OTHER): Admitting: Internal Medicine

## 2023-07-01 ENCOUNTER — Ambulatory Visit: Payer: PPO

## 2023-07-01 VITALS — Ht 70.5 in | Wt 187.0 lb

## 2023-07-01 VITALS — BP 126/70 | HR 78 | Temp 97.9°F | Ht 70.5 in | Wt 187.0 lb

## 2023-07-01 DIAGNOSIS — J449 Chronic obstructive pulmonary disease, unspecified: Secondary | ICD-10-CM | POA: Diagnosis not present

## 2023-07-01 DIAGNOSIS — R5383 Other fatigue: Secondary | ICD-10-CM | POA: Insufficient documentation

## 2023-07-01 DIAGNOSIS — I10 Essential (primary) hypertension: Secondary | ICD-10-CM | POA: Diagnosis not present

## 2023-07-01 DIAGNOSIS — E890 Postprocedural hypothyroidism: Secondary | ICD-10-CM

## 2023-07-01 DIAGNOSIS — Z0000 Encounter for general adult medical examination without abnormal findings: Secondary | ICD-10-CM

## 2023-07-01 DIAGNOSIS — E039 Hypothyroidism, unspecified: Secondary | ICD-10-CM

## 2023-07-01 DIAGNOSIS — R7303 Prediabetes: Secondary | ICD-10-CM

## 2023-07-01 LAB — CBC WITH DIFFERENTIAL/PLATELET
Basophils Absolute: 0 10*3/uL (ref 0.0–0.1)
Basophils Relative: 0.6 % (ref 0.0–3.0)
Eosinophils Absolute: 0.2 10*3/uL (ref 0.0–0.7)
Eosinophils Relative: 6.3 % — ABNORMAL HIGH (ref 0.0–5.0)
HCT: 39.2 % (ref 39.0–52.0)
Hemoglobin: 13.5 g/dL (ref 13.0–17.0)
Lymphocytes Relative: 19.4 % (ref 12.0–46.0)
Lymphs Abs: 0.7 10*3/uL (ref 0.7–4.0)
MCHC: 34.5 g/dL (ref 30.0–36.0)
MCV: 100.7 fl — ABNORMAL HIGH (ref 78.0–100.0)
Monocytes Absolute: 0.5 10*3/uL (ref 0.1–1.0)
Monocytes Relative: 12.3 % — ABNORMAL HIGH (ref 3.0–12.0)
Neutro Abs: 2.3 10*3/uL (ref 1.4–7.7)
Neutrophils Relative %: 61.4 % (ref 43.0–77.0)
Platelets: 218 10*3/uL (ref 150.0–400.0)
RBC: 3.89 Mil/uL — ABNORMAL LOW (ref 4.22–5.81)
RDW: 13.2 % (ref 11.5–15.5)
WBC: 3.8 10*3/uL — ABNORMAL LOW (ref 4.0–10.5)

## 2023-07-01 LAB — COMPREHENSIVE METABOLIC PANEL WITH GFR
ALT: 12 U/L (ref 0–53)
AST: 18 U/L (ref 0–37)
Albumin: 4 g/dL (ref 3.5–5.2)
Alkaline Phosphatase: 68 U/L (ref 39–117)
BUN: 15 mg/dL (ref 6–23)
CO2: 23 meq/L (ref 19–32)
Calcium: 9.7 mg/dL (ref 8.4–10.5)
Chloride: 107 meq/L (ref 96–112)
Creatinine, Ser: 0.88 mg/dL (ref 0.40–1.50)
GFR: 79.61 mL/min (ref >60.00)
Glucose, Bld: 105 mg/dL — ABNORMAL HIGH (ref 70–99)
Potassium: 3.8 meq/L (ref 3.5–5.1)
Sodium: 141 meq/L (ref 135–145)
Total Bilirubin: 0.6 mg/dL (ref 0.2–1.2)
Total Protein: 7.7 g/dL (ref 6.0–8.3)

## 2023-07-01 LAB — IBC PANEL
Iron: 153 ug/dL (ref 42–165)
Saturation Ratios: 70.5 % — ABNORMAL HIGH (ref 20.0–50.0)
TIBC: 217 ug/dL — ABNORMAL LOW (ref 250.0–450.0)
Transferrin: 155 mg/dL — ABNORMAL LOW (ref 212.0–360.0)

## 2023-07-01 LAB — HEMOGLOBIN A1C: Hgb A1c MFr Bld: 5.6 % (ref 4.6–6.5)

## 2023-07-01 LAB — FERRITIN: Ferritin: 37.1 ng/mL (ref 22.0–322.0)

## 2023-07-01 LAB — TSH: TSH: 0.22 u[IU]/mL — ABNORMAL LOW (ref 0.35–5.50)

## 2023-07-01 MED ORDER — SYNTHROID 137 MCG PO TABS: 137.0000 ug | ORAL_TABLET | Freq: Every day | ORAL | 3 refills | Status: AC

## 2023-07-01 MED ORDER — BREZTRI AEROSPHERE 160-9-4.8 MCG/ACT IN AERO: 2.0000 | INHALATION_SPRAY | Freq: Two times a day (BID) | RESPIRATORY_TRACT | Status: DC

## 2023-07-01 MED ORDER — TRELEGY ELLIPTA 200-62.5-25 MCG/ACT IN AEPB: 1.0000 | INHALATION_SPRAY | Freq: Every day | RESPIRATORY_TRACT | Status: DC

## 2023-07-01 MED ORDER — TRELEGY ELLIPTA 100-62.5-25 MCG/ACT IN AEPB
1.0000 | INHALATION_SPRAY | Freq: Every day | RESPIRATORY_TRACT | Status: DC
Start: 2023-07-01 — End: 2023-07-01

## 2023-07-01 NOTE — Assessment & Plan Note (Signed)
 Chronic Lab Results  Component Value Date   HGBA1C 5.7 02/02/2023   Check a1c I do not think his symptoms are related to his sugars

## 2023-07-01 NOTE — Assessment & Plan Note (Signed)
 Chronic Has gotten worse Started with radiation treatment for his prostate cancer in 2023 Cannot stand long periods of time due to exhaustion Multiple possible causes Check CBC, CMP, TSH, A1c, iron  panel He is taking all of his vitamins regularly so I doubt this is a vitamin deficiency Saw cardiology 6 months ago, but depending on workup may need to see them sooner than recommended Referral to pulmonary given shortness of breath

## 2023-07-01 NOTE — Assessment & Plan Note (Signed)
 Chronic  Experiencing increased fatigue, but this is chronic-TSH normal 5 months ago Check tsh and will titrate med dose if needed Currently taking Synthroid  150 mcg daily

## 2023-07-01 NOTE — Assessment & Plan Note (Signed)
 Chronic BP well controlled Continue metoprolol xl 50 mg daily, losartan 50 mg bid Cmp, cbc

## 2023-07-01 NOTE — Progress Notes (Signed)
 Subjective:  Please attest and cosign this visit due to patients primary care provider not being in the office at the time the visit was completed.  (Pt of Dr Oma Bias)   Preston Reeves is a 83 y.o. who presents for a Medicare Wellness preventive visit.  As a reminder, Annual Wellness Visits don't include a physical exam, and some assessments may be limited, especially if this visit is performed virtually. We may recommend an in-person follow-up visit with your provider if needed.  Visit Complete: Virtual I connected with  Don Fritter on 07/01/23 by a audio enabled telemedicine application and verified that I am speaking with the correct person using two identifiers.  Patient Location: Home  Provider Location: Office/Clinic  I discussed the limitations of evaluation and management by telemedicine. The patient expressed understanding and agreed to proceed.  Vital Signs: Because this visit was a virtual/telehealth visit, some criteria may be missing or patient reported. Any vitals not documented were not able to be obtained and vitals that have been documented are patient reported.  VideoDeclined- This patient declined Librarian, academic. Therefore the visit was completed with audio only.  Persons Participating in Visit: Patient.  AWV Questionnaire: Yes: Patient Medicare AWV questionnaire was completed by the patient on 06/30/2023; I have confirmed that all information answered by patient is correct and no changes since this date.  Cardiac Risk Factors include: advanced age (>68men, >55 women);male gender;hypertension     Objective:     Today's Vitals   07/01/23 1344  Weight: 187 lb (84.8 kg)  Height: 5' 10.5" (1.791 m)   Body mass index is 26.45 kg/m.     07/01/2023    1:43 PM 06/30/2022    3:08 PM 05/08/2022   10:35 AM 11/17/2021   11:34 AM 05/07/2021   11:26 AM 12/13/2020   10:18 AM 05/10/2019   10:55 AM  Advanced Directives  Does Patient  Have a Medical Advance Directive? Yes Yes Yes Yes Yes Yes No  Type of Estate agent of [place];Living will  Healthcare Power of [place];Living will Healthcare Power of [place];Living will Living will;Healthcare Power of Attorney Living will;Healthcare Power of Attorney   Does patient want to make changes to medical advance directive?  Yes (MAU/Ambulatory/Procedural Areas - Information given) No - Patient declined  No - Patient declined No - Patient declined   Copy of Healthcare Power of Attorney in Chart? No - copy requested  No - copy requested No - copy requested No - copy requested No - copy requested   Would patient like information on creating a medical advance directive?   No - Patient declined  No - Patient declined  No - Patient declined    Current Medications (verified) Outpatient Encounter Medications as of 07/01/2023  Medication Sig   acetaminophen  (TYLENOL ) 500 MG tablet Take 500 mg by mouth every 6 (six) hours as needed for mild pain.   albuterol  (VENTOLIN  HFA) 108 (90 Base) MCG/ACT inhaler INHALE 1 TO 2 PUFFS INTO THE LUNGS EVERY 6 HOURS AS NEEDED FOR WHEEZING OR SHORTNESS OF BREATH   ANORO ELLIPTA  62.5-25 MCG/ACT AEPB INHALE 1 PUFF INTO THE LUNGS DAILY AT 6 AM   Ascorbic Acid  (VITAMIN C ) 1000 MG tablet Take 1,000 mg by mouth daily.   Calcium  Carbonate-Vitamin D  (CALCIUM  + D PO) Take 1 tablet by mouth daily.    Docusate Sodium  (COLACE PO) Take 200 mg by mouth in the morning and at bedtime.   fexofenadine (  ALLEGRA) 180 MG tablet Take 180 mg by mouth daily.   FIBER PO Take by mouth in the morning and at bedtime.   fluticasone  (FLONASE ) 50 MCG/ACT nasal spray Place 2 sprays into both nostrils daily.   Fluticasone -Umeclidin-Vilant (TRELEGY ELLIPTA) 200-62.5-25 MCG/ACT AEPB Inhale 1 puff into the lungs daily.   Glucosamine-Chondroit-Vit C-Mn (GLUCOSAMINE 1500 COMPLEX PO) Take by mouth in the morning and at bedtime.   losartan  (COZAAR ) 50 MG tablet Take 1 tablet (50  mg total) by mouth 2 (two) times daily.   metoprolol  succinate (TOPROL -XL) 50 MG 24 hr tablet TAKE 1 TABLET(50 MG) BY MOUTH DAILY WITH OR IMMEDIATELY FOLLOWING A MEAL   Multiple Vitamins-Minerals (MULTIVITAMIN PO) Take 1 tablet by mouth daily.    Omega-3 Fatty Acids (FISH OIL PO) Take 1,000 mg by mouth in the morning and at bedtime.   Red Yeast Rice 600 MG CAPS Take 1,200 mg by mouth daily.    SALINE NASAL SPRAY NA Place 1 spray into the nose daily as needed (allergies).    SYNTHROID  150 MCG tablet TAKE 1 TABLET(150 MCG) BY MOUTH DAILY   vitamin B-12 (CYANOCOBALAMIN) 1000 MCG tablet Take 1,000 mcg by mouth daily.   No facility-administered encounter medications on file as of 07/01/2023.    Allergies (verified) Patient has no known allergies.   History: Past Medical History:  Diagnosis Date   Allergy    SEASONAL   Asthma    Cataract    Colon polyps    Adenomatous   COPD (chronic obstructive pulmonary disease) (HCC)    Diverticulitis of colon with perforation 2010   Colectomy/ostomy   Diverticulosis    hx of   Dysrhythmia    Hearing aid worn    bilaterally   Heart murmur    Hemochromatosis    hx of   History of BPH    History of COPD    Hyperlipidemia    Hypertension    Hypothyroidism    Incisional hernia    MVP (mitral valve prolapse)    Thyroid  disease    Past Surgical History:  Procedure Laterality Date   COLONOSCOPY  last 09/25/2013   COLONOSCOPY W/ BIOPSIES  2011   Dr Elvin Hammer   COLOSTOMY TAKEDOWN  2010   Dr Cherlynn Cornfield   GOLD SEED IMPLANT N/A 11/17/2021   Procedure: GOLD SEED IMPLANT;  Surgeon: Florencio Hunting, MD;  [facility];  Service: Urology;  Laterality: N/A;  15 MINUTES NEEDED FOR CASE   INGUINAL HERNIA REPAIR Left 1999   Dr. Corliss Dies   INSERTION OF MESH N/A 10/13/2013   Procedure: INSERTION OF MESH;  Surgeon: Candyce Champagne, MD;  Location: [facility] OR;  Service: General;  Laterality: N/A;   LAPAROSCOPIC LYSIS OF ADHESIONS N/A 10/13/2013    Procedure: LAPAROSCOPIC LYSIS OF ADHESIONS;  Surgeon: Candyce Champagne, MD;  Location: [facility] OR;  Service: General;  Laterality: N/A;   PARTIAL COLECTOMY  08/12/2008   Hartmann; perforated colon from diverticulitis   SPACE OAR INSTILLATION N/A 11/17/2021   Procedure: SPACE OAR INSTILLATION;  Surgeon: Florencio Hunting, MD;  Location[facility]  Service: Urology;  Laterality: N/A;   UPPER GI ENDOSCOPY     VENTRAL HERNIA REPAIR N/A 10/13/2013   Procedure: REPAIR OF VENTRAL WALL HERNIA WITH UNILATERAL COMPARTMENT SEPARATION;  Surgeon: Candyce Champagne, MD;  Location: [facility] OR;  Service: General;  Laterality: N/A;   Family History  Problem Relation Age of Onset   Breast cancer Mother 17 - 91   Colon cancer  Father 42 - 9   Heart disease Father    Diabetes Father    Heart attack Father    Stroke Neg Hx    Esophageal cancer Neg Hx    Rectal cancer Neg Hx    Stomach cancer Neg Hx    Colon polyps Neg Hx    Social History   Socioeconomic History   Marital status: Married    Spouse name: Not on file   Number of children: Not on file   Years of education: Not on file   Highest education level: Bachelor's degree (e.g., BA, AB, BS)  Occupational History   Occupation: Retired    Associate Professor: RETIRED  Tobacco Use   Smoking status: Former    Current packs/day: 0.00    Types: Cigarettes    Quit date: 2005    Years since quitting: 20.4   Smokeless tobacco: Never  Vaping Use   Vaping status: Never Used  Substance and Sexual Activity   Alcohol use: Yes    Alcohol/week: 7.0 standard drinks of alcohol    Types: 7 Cans of beer per week    Comment: drinks beer weekly   Drug use: No   Sexual activity: Yes  Other Topics Concern   Not on file  Social History Narrative   Married. Education: [employer].   Social Drivers of Corporate investment banker Strain: Low Risk  (07/01/2023)   Overall Financial Resource Strain (CARDIA)    Difficulty of Paying Living Expenses: Not hard at all  Food  Insecurity: No Food Insecurity (07/01/2023)   Hunger Vital Sign    Worried About Running Out of Food in the Last Year: Never true    Ran Out of Food in the Last Year: Never true  Transportation Needs: No Transportation Needs (07/01/2023)   PRAPARE - Administrator, Civil Service (Medical): No    Lack of Transportation (Non-Medical): No  Physical Activity: Insufficiently Active (07/01/2023)   Exercise Vital Sign    Days of Exercise per Week: 1 day    Minutes of Exercise per Session: 20 min  Stress: No Stress Concern Present (07/01/2023)   [employer] of Occupational Health - Occupational Stress Questionnaire    Feeling of Stress : Not at all  Social Connections: Socially Integrated (07/01/2023)   Social Connection and Isolation Panel [NHANES]    Frequency of Communication with Friends and Family: Three times a week    Frequency of Social Gatherings with Friends and Family: Twice a week    Attends Religious Services: More than 4 times per year    Active Member of [employer] or Organizations: Yes    Attends Engineer, structural: More than 4 times per year    Marital Status: Married    Tobacco Counseling Counseling given: No    Clinical Intake:  Pre-visit preparation completed: Yes  Pain : No/denies pain     BMI - recorded: 26.45 Nutritional Risks: None Diabetes: No  Lab Results  Component Value Date   HGBA1C 5.7 02/02/2023   HGBA1C 5.9 01/13/2022   HGBA1C 5.9 12/13/2020     How often do you need to have someone help you when you read instructions, pamphlets, or other written materials from your doctor or pharmacy?: 1 - Never  Interpreter Needed?: No  Information entered by :: Kandy Orris, CMA   Activities of Daily Living     07/01/2023    1:47 PM 06/30/2023   10:55 AM  In your present state of health,  do you have any difficulty performing the following activities:  Hearing? 0 0  Vision? 0 0  Difficulty concentrating or making decisions? 0 0   Walking or climbing stairs? 0 0  Dressing or bathing? 0 0  Doing errands, shopping? 0 0  Preparing Food and eating ? N N  Using the Toilet? N N  In the past six months, have you accidently leaked urine? N N  Do you have problems with loss of bowel control? N N  Managing your Medications? N N  Managing your Finances? N N  Housekeeping or managing your Housekeeping? N N    Patient Care Team: Colene Dauphin, MD as PCP - General (Internal Medicine) Odie Benne, MD as PCP - Cardiology (Cardiology) Florencio Hunting, MD as Consulting Physician (Urology) Tobin Forts, MD as Consulting Physician (Gastroenterology) Driscilla George, MD as Consulting Physician (Cardiology) Szabat, Tino Foreman, [facility] (Inactive) (Pharmacist) Ivor Mars, MD as Consulting Physician (Oncology) Arnie Lao, MD as Consulting Physician (Orthopedic Surgery)  I have updated your Care Teams any recent Medical Services you may have received from other providers in the past year.     Assessment:    This is a routine wellness examination for Br[place].  Hearing/Vision screen Hearing Screening - Comments:: Wears hearing aids Vision Screening - Comments:: Wears rx glasses - seen 39yrs ago at [facility]   Goals Addressed               This Visit's Progress     Patient Stated (pt-stated)        Patient stated he plans to casually staying active       Depression Screen     07/01/2023    1:49 PM 07/01/2023    9:26 AM 06/30/2022    3:06 PM 01/13/2022    1:48 PM 12/13/2020   10:17 AM 12/13/2019   10:26 AM 12/05/2018    7:59 AM  PHQ 2/9 Scores  PHQ - 2 Score 0 0 0 0 0 0 0  PHQ- 9 Score 0   0       Fall Risk     07/01/2023    1:48 PM 07/01/2023    9:26 AM 06/30/2023   10:55 AM 06/30/2022    3:05 PM 01/13/2022    1:47 PM  Fall Risk   Falls in the past year? 0 0 0 0 0  Number falls in past yr: 0 0 0 0 0  Injury with Fall? 0 0 0 0 0  Risk for fall due to : No Fall Risks No Fall Risks   No Fall Risks No Fall Risks  Follow up Falls evaluation completed;Falls prevention discussed Falls evaluation completed  Falls prevention discussed;Education provided;Falls evaluation completed Falls evaluation completed    MEDICARE RISK AT HOME:  Medicare Risk at Home Any stairs in or around the home?: Yes If so, are there any without handrails?: No Home free of loose throw rugs in walkways, pet beds, electrical cords, etc?: Yes Adequate lighting in your home to reduce risk of falls?: Yes Life alert?: No Use of a cane, walker or w/c?: No Grab bars in the bathroom?: Yes Shower chair or bench in shower?: Yes Elevated toilet seat or a handicapped toilet?: No  TIMED UP AND GO:  Was the test performed?  No  Cognitive Function: 6CIT completed        07/01/2023    1:52 PM 06/30/2022    3:08 PM  6CIT Screen  What Year?  0 points 0 points  What month? 0 points 0 points  What time? 0 points 0 points  Count back from 20 0 points 0 points  Months in reverse 0 points 0 points  Repeat phrase 0 points 0 points  Total Score 0 points 0 points    Immunizations Immunization History  Administered Date(s) Administered   Fluad Quad(high Dose 65+) 12/05/2018, 12/13/2019, 12/13/2020, 01/13/2022   Fluad Trivalent(High Dose 65+) 12/21/2022   Influenza, High Dose Seasonal PF 09/15/2016, 12/03/2017   Influenza,inj,Quad PF,6+ Mos 10/14/2013   Influenza-Unspecified 12/26/2012, 10/28/2014, 10/06/2015, 09/30/2016   PFIZER(Purple Top)SARS-COV-2 Vaccination 04/15/2019, 05/17/2019   Pneumococcal Conjugate-13 06/07/2014   Pneumococcal Polysaccharide-23 11/27/2011   Td 06/20/2013    Screening Tests Health Maintenance  Topic Date Due   Zoster Vaccines- Shingrix (1 of 2) Never done   COVID-19 Vaccine (3 - Pfizer risk series) 06/14/2019   DTaP/Tdap/Td (2 - Tdap) 06/21/2023   Colonoscopy  10/11/2023   INFLUENZA VACCINE  08/27/2023   Medicare Annual Wellness (AWV)  06/30/2024   Pneumonia Vaccine 66+  Years old  Completed   HPV VACCINES  Aged Out   Meningococcal B Vaccine  Aged Out    Health Maintenance  Health Maintenance Due  Topic Date Due   Zoster Vaccines- Shingrix (1 of 2) Never done   COVID-19 Vaccine (3 - Pfizer risk series) 06/14/2019   DTaP/Tdap/Td (2 - Tdap) 06/21/2023   Colonoscopy  10/11/2023   Health Maintenance Items Addressed:   Additional Screening:  Vision Screening: Recommended annual ophthalmology exams for early detection of glaucoma and other disorders of the eye. Pt stated had eye exam 2 yrs ago w/[facility]. Advised to schedule an appt for 2025 for an exam.  Dental Screening: Recommended annual dental exams for proper oral hygiene  Community Resource Referral / Chronic Care Management: CRR required this visit?  No   CCM required this visit?  No   Plan:    I have personally reviewed and noted the following in the patient's chart:   Medical and social history Use of alcohol, tobacco or illicit drugs  Current medications and supplements including opioid prescriptions. Patient is not currently taking opioid prescriptions. Functional ability and status Nutritional status Physical activity Advanced directives List of other physicians Hospitalizations, surgeries, and ER visits in previous 12 months Vitals Screenings to include cognitive, depression, and falls Referrals and appointments  In addition, I have reviewed and discussed with patient certain preventive protocols, quality metrics, and best practice recommendations. A written personalized care plan for preventive services as well as general preventive health recommendations were provided to patient.   Patria Bookbinder, CMA   07/01/2023   After Visit Summary: (MyChart) Due to this being a telephonic visit, the after visit summary with patients personalized plan was offered to patient via MyChart   Notes: Nothing significant to report at this time.

## 2023-07-01 NOTE — Patient Instructions (Addendum)
 Mr. Preston Reeves , Thank you for taking time out of your busy schedule to complete your Annual Wellness Visit with me. I enjoyed our conversation and look forward to speaking with you again next year. I, as well as your care team,  appreciate your ongoing commitment to your health goals. Please review the following plan we discussed and let me know if I can assist you in the future. Your Game plan/ To Do List    Follow up Visits: Next Medicare AWV with our clinical staff: 07/03/2024   Have you seen your provider in the last 6 months (3 months if uncontrolled diabetes)? Yes Next Office Visit with your provider: 02/07/2024  Clinician Recommendations:  Aim for 30 minutes of exercise or brisk walking, 6-8 glasses of water, and 5 servings of fruits and vegetables each day. Educated and advised on getting the COVID, Shingles, and Tdap (Tetenus) vaccines in 2025.      This is a list of the screening recommended for you and due dates:  Health Maintenance  Topic Date Due   Zoster (Shingles) Vaccine (1 of 2) Never done   COVID-19 Vaccine (3 - Pfizer risk series) 06/14/2019   DTaP/Tdap/Td vaccine (2 - Tdap) 06/21/2023   Colon Cancer Screening  10/11/2023   Flu Shot  08/27/2023   Medicare Annual Wellness Visit  06/30/2024   Pneumonia Vaccine  Completed   HPV Vaccine  Aged Out   Meningitis B Vaccine  Aged Out    Advanced directives: (Copy Requested) Please bring a copy of your health care power of attorney and living will to the office to be added to your chart at your convenience. You can mail to [facility] [address] [place], [place] [address] or email to [email or web address] Advance Care Planning is important because it:  [x]  Makes sure you receive the medical care that is consistent with your values, goals, and preferences  [x]  It provides guidance to your family and loved ones and reduces their decisional burden about whether or not they are making the right decisions based  on your wishes.  Follow the link provided in your after visit summary or read over the paperwork we have mailed to you to help you started getting your Advance Directives in place. If you need assistance in completing these, please reach out to us  so that we can help you!

## 2023-07-01 NOTE — Assessment & Plan Note (Signed)
 Chronic Follows with hematology Has not seen them in over a year-advised for him to make routine follow-up Will check CBC, iron  panel

## 2023-07-01 NOTE — Assessment & Plan Note (Signed)
 Chronic Has chronic dyspnea on exertion, but this has gotten worse Currently taking Anoro inhaler daily and using albuterol  as needed-recently has been using it 3 times a day-he is not sure if that is helping or not Stop Anoro-try Trelegy 1 puff daily Continue albuterol  every 6 hours as needed Referral to pulmonary

## 2023-07-01 NOTE — Patient Instructions (Addendum)
      Blood work was ordered.       Medications changes include :   None -Try  Trelegy inhaler 1 puffs once a day.  Use albuterol  inhaler - Q 6 hr     A referral was ordered pulmonary - Dr Diania Fortes and someone will call you to schedule an appointment.   Call [facility] at Phone: [phone]     Return if symptoms worsen or fail to improve.

## 2023-07-06 ENCOUNTER — Encounter: Payer: Self-pay | Admitting: Internal Medicine

## 2023-07-06 ENCOUNTER — Ambulatory Visit: Admitting: Internal Medicine

## 2023-07-06 ENCOUNTER — Ambulatory Visit: Payer: Self-pay | Admitting: Internal Medicine

## 2023-07-06 ENCOUNTER — Ambulatory Visit (INDEPENDENT_AMBULATORY_CARE_PROVIDER_SITE_OTHER)

## 2023-07-06 VITALS — BP 124/72 | HR 66 | Temp 97.7°F | Ht 70.0 in | Wt 186.6 lb

## 2023-07-06 DIAGNOSIS — R0609 Other forms of dyspnea: Secondary | ICD-10-CM

## 2023-07-06 DIAGNOSIS — J40 Bronchitis, not specified as acute or chronic: Secondary | ICD-10-CM | POA: Diagnosis not present

## 2023-07-06 DIAGNOSIS — J449 Chronic obstructive pulmonary disease, unspecified: Secondary | ICD-10-CM

## 2023-07-06 DIAGNOSIS — Z87891 Personal history of nicotine dependence: Secondary | ICD-10-CM

## 2023-07-06 MED ORDER — BREZTRI AEROSPHERE 160-9-4.8 MCG/ACT IN AERO: 2.0000 | INHALATION_SPRAY | Freq: Two times a day (BID) | RESPIRATORY_TRACT | Status: DC

## 2023-07-06 NOTE — Patient Instructions (Addendum)
 Plan A = Automatic = Always=    Breztri  Take 2 puffs first thing in am and then another 2 puffs about 12 hours later(optional)     Work on inhaler technique:  relax and gently blow all the way out then take a nice smooth full deep breath back in, triggering the inhaler at same time you start breathing in.  Hold breath in for at least  5 seconds if you can. Blow out Breztri   thru nose. Rinse and gargle with water when done.  If mouth or throat bother you at all,  try brushing teeth/gums/tongue with arm and hammer toothpaste/ make a slurry and gargle and spit out.      Plan B = Backup (to supplement plan A, not to replace it) Only use your albuterol  inhaler as a rescue medication to be used if you can't catch your breath by resting or doing a relaxed purse lip breathing pattern.  - The less you use it, the better it will work when you need it. - Ok to use the inhaler up to 2 puffs  every 4 hours if you must but call for appointment if use goes up over your usual need - Don't leave home without it !!  (think of it like the spare tire for your car)    Please remember to go to the  x-ray department  for your tests - we will call you with the results when they are available    PFTs first available and I will call with the plan

## 2023-07-06 NOTE — Progress Notes (Signed)
 Preston Reeves, male    DOB: 10-09-1940   MRN: [id number]   Brief patient profile:  36 yowm quit smoking  2005 s obvious resp cc  referred to pulmonary clinic 07/06/2023 by Oma Bias  for persisstent  doe  onset was 2008 rx better with albuterol  s/p final RT of pelvis  July 2024 and generally more doe since.    History of Present Illness  07/06/2023  Pulmonary/ 1st office eval/[place]  Chief Complaint  Patient presents with   Follow-up    S.O.B consult- Pt states this is with exertion. Minimum activity. Pt states his radiation made things worse. (2024)   Dyspnea:  p prostate ca noted doe mb and back  50 ft flat / groceries into house really wears him out, not impoved while on various dpis plus prn sab  Cough: raspy dry mostly daytime cough  Sleep: bed is slt elevated x 1 pillow  SABA use: not in sev days  02 QMV:HQIO      No obvious day to day or daytime pattern/variability or assoc excess/ purulent sputum or mucus plugs or hemoptysis or cp or chest tightness, subjective wheeze or overt sinus or hb symptoms.    Also denies any obvious fluctuation of symptoms with weather or environmental changes or other aggravating or alleviating factors except as outlined above   No unusual exposure hx or h/o childhood pna/ asthma or knowledge of premature birth.  Current Allergies, Complete Past Medical History, Past Surgical History, Family History, and Social History were reviewed in [employer] record.  ROS  The following are not active complaints unless bolded Hoarseness, sore throat, dysphagia, dental problems, itching, sneezing,  nasal congestion or discharge of excess mucus or purulent secretions, ear ache,   fever, chills, sweats, unintended wt loss or wt gain, classically pleuritic or exertional cp,  orthopnea pnd or arm/hand swelling  or leg swelling, presyncope, palpitations, abdominal pain, anorexia, nausea, vomiting, diarrhea  or change in bowel habits or change in  bladder habits, change in stools or change in urine, dysuria, hematuria,  rash, arthralgias, visual complaints, headache, numbness, weakness or ataxia or problems with walking or coordination,  change in mood or  memory.             Outpatient Medications Prior to Visit  Medication Sig Dispense Refill   acetaminophen  (TYLENOL ) 500 MG tablet Take 500 mg by mouth every 6 (six) hours as needed for mild pain.     albuterol  (VENTOLIN  HFA) 108 (90 Base) MCG/ACT inhaler INHALE 1 TO 2 PUFFS INTO THE LUNGS EVERY 6 HOURS AS NEEDED FOR WHEEZING OR SHORTNESS OF BREATH 18 g 8   ANORO ELLIPTA  62.5-25 MCG/ACT AEPB INHALE 1 PUFF INTO THE LUNGS DAILY AT 6 AM 60 each 2   Ascorbic Acid  (VITAMIN C ) 1000 MG tablet Take 1,000 mg by mouth daily.     Calcium  Carbonate-Vitamin D  (CALCIUM  + D PO) Take 1 tablet by mouth daily.      Docusate Sodium  (COLACE PO) Take 200 mg by mouth in the morning and at bedtime.     fexofenadine (ALLEGRA) 180 MG tablet Take 180 mg by mouth daily.     FIBER PO Take by mouth in the morning and at bedtime.     fluticasone  (FLONASE ) 50 MCG/ACT nasal spray Place 2 sprays into both nostrils daily. 16 g 6   Fluticasone -Umeclidin-Vilant (TRELEGY ELLIPTA ) 200-62.5-25 MCG/ACT AEPB Inhale 1 puff into the lungs daily.     Glucosamine-Chondroit-Vit C-Mn (GLUCOSAMINE  1500 COMPLEX PO) Take by mouth in the morning and at bedtime.     losartan  (COZAAR ) 50 MG tablet Take 1 tablet (50 mg total) by mouth 2 (two) times daily. 180 tablet 3   metoprolol  succinate (TOPROL -XL) 50 MG 24 hr tablet TAKE 1 TABLET(50 MG) BY MOUTH DAILY WITH OR IMMEDIATELY FOLLOWING A MEAL 90 tablet 0   Multiple Vitamins-Minerals (MULTIVITAMIN PO) Take 1 tablet by mouth daily.      Omega-3 Fatty Acids (FISH OIL PO) Take 1,000 mg by mouth in the morning and at bedtime.     Red Yeast Rice 600 MG CAPS Take 1,200 mg by mouth daily.      SALINE NASAL SPRAY NA Place 1 spray into the nose daily as needed (allergies).      SYNTHROID  137 MCG  tablet Take 1 tablet (137 mcg total) by mouth daily before breakfast. 90 tablet 3   vitamin B-12 (CYANOCOBALAMIN) 1000 MCG tablet Take 1,000 mcg by mouth daily.     No facility-administered medications prior to visit.    Past Medical History:  Diagnosis Date   Allergy    SEASONAL   Asthma    Cataract    Colon polyps    Adenomatous   COPD (chronic obstructive pulmonary disease) (HCC)    Diverticulitis of colon with perforation 2010   Colectomy/ostomy   Diverticulosis    hx of   Dysrhythmia    Hearing aid worn    bilaterally   Heart murmur    Hemochromatosis    hx of   History of BPH    History of COPD    Hyperlipidemia    Hypertension    Hypothyroidism    Incisional hernia    MVP (mitral valve prolapse)    Thyroid  disease       Objective:     BP 124/72 (BP Location: Left Arm, Patient Position: Sitting, Cuff Size: Normal)   Pulse 66   Temp 97.7 F (36.5 C) (Temporal)   Ht 5\' 10"  (1.778 m)   Wt 186 lb 9.6 oz (84.6 kg)   SpO2 94% Comment: RA  BMI 26.77 kg/m   SpO2: 94 % (RA) pleasant mentally sharp amb wm nad    HEENT : Oropharynx  clear      Nasal turbinates nl    NECK :  without  apparent JVD/ palpable Nodes/TM    LUNGS: no acc muscle use,  Nl contour chest minimally barrel shaped with slt distant BS bilaterally    CV:  RRR  no s3 with 2-3/6 HSM  s  increase in P2, and no edema   ABD:  soft and nontender   MS:  Gait steady   ext warm without deformities Or obvious joint restrictions  calf tenderness, cyanosis or clubbing    SKIN: warm and dry without lesions    NEURO:  alert, approp, nl sensorium with  no motor or cerebellar deficits apparent.     Lab Results  Component Value Date   HGB 13.5 07/01/2023   HGB 13.5 02/02/2023   HGB 13.5 05/08/2022   HGB 11.8 (L) 02/17/2022   HGB 12.8 (L) 05/07/2021   HGB 14.6 05/08/2020   HGB 15.0 12/28/2016   HGB 13.9 09/18/2016   HGB 14.9 12/30/2015   HGB 14.1 12/31/2014   HGB 15.4 01/12/2007   HGB  15.2 01/14/2006      CXR PA and Lateral:   07/06/2023 :    I personally reviewed images and impression is as follows:  No active cardiopulmonary disease.    CT  PET   10/16/21   severe emphysema and Calcified pleural plaques.    Assessment   Dyspnea on exertion Onset  2008 with ? Asthma around cats (clinical dx by son, ER MD , better  with saba - Echo 06/08/22 1. LV EF  60 to 65%   Grade I diastolic dysfunction  . 2. Right ventricular systolic function is normal. The right ventricular size is normal. There is normal pulmonary artery systolic pressure. The estimated right ventricular systolic pressure is 34. 6 mmHg. 3. The mitral valve is normal in structure. Trivial mitral valve regurgitation. No evidence of mitral stenosis. 4. The aortic valve has an indeterminant number of cusps. Aortic valve regurgitation is not visualized. Aortic - 07/06/2023  After extensive coaching inhaler device,  effectiveness =    90% with HFA so try breztri  2 each am pending pfts and d/c dpi's - 07/06/2023   Walked on RA  x  3  lap(s) =  approx 750  ft  @ mod to fast  pace, stopped due to end of study  with lowest 02 sats 92% and no doe   When respiratory symptoms begin or become refractory well after a patient reports complete smoking cessation,  Especially when this wasn't the case while they were smoking, a red flag is raised based on the work of Dr Bland Bunnell which states:  if you quit smoking when your best day FEV1 is still well preserved it is highly unlikely you will progress to severe disease.  That is to say, once the smoking stops,  the symptoms should not suddenly erupt or markedly worsen.  If so, the differential diagnosis should include  obesity/deconditioning,  LPR/Reflux/Aspiration syndromes,  occult CHF(he has MR) , or  especially side effect of medications commonly used in this population (on DPIs with raspy upper airway cough)   Rec Change from dpi to breztri  trial basis plus approp saba: Re  SABA :  I spent extra time with pt today reviewing appropriate use of albuterol  for prn use on exertion with the following points: 1) saba is for relief of sob that does not improve by walking a slower pace or resting but rather if the pt does not improve after trying this first. 2) If the pt is convinced, as many are, that saba helps recover from activity faster then it's easy to tell if this is the case by re-challenging : ie stop, take the inhaler, then p 5 minutes try the exact same activity (intensity of workload) that just caused the symptoms and see if they are substantially diminished or not after saba 3) if there is an activity that reproducibly causes the symptoms, try the saba 15 min before the activity on alternate days   If in fact the saba really does help, then fine to continue to use it prn but advised may need to look closer at the maintenance regimen being used to achieve better control of airways disease with exertion.   If raspy cough no better off dpi then add gerd rx next  F/u with pfts next / consider repeat echo next.   I had an extended discussion with the patient reviewing all relevant studies completed to date and  lasting 15 to 20 minutes of a 25 minute visit re:  Each maintenance medication was reviewed in detail including emphasizing most importantly the difference between maintenance and prns and under what circumstances the prns are to be triggered using an  action plan format where appropriate.  Total time for H and P, chart review, counseling, reviewing hfa/dpi device(s) , directly observing portions of ambulatory 02 saturation study/ and generating customized AVS unique to this office visit / same day charting = 45 min new pt eval                     Vernestine Gondola, MD 07/06/2023

## 2023-07-06 NOTE — Assessment & Plan Note (Signed)
 Onset  2008 with ? Asthma around cats (clinical dx by son, ER MD , better  with saba - Echo 06/08/22 1. LV EF  60 to 65%   Grade I diastolic dysfunction  . 2. Right ventricular systolic function is normal. The right ventricular size is normal. There is normal pulmonary artery systolic pressure. The estimated right ventricular systolic pressure is 34. 6 mmHg. 3. The mitral valve is normal in structure. Trivial mitral valve regurgitation. No evidence of mitral stenosis. 4. The aortic valve has an indeterminant number of cusps. Aortic valve regurgitation is not visualized. Aortic - 07/06/2023  After extensive coaching inhaler device,  effectiveness =    90% with HFA so try breztri  2 each am pending pfts and d/c dpi's - 07/06/2023   Walked on RA  x  3  lap(s) =  approx 750  ft  @ mod to fast  pace, stopped due to end of study  with lowest 02 sats 92% and no doe   When respiratory symptoms begin or become refractory well after a patient reports complete smoking cessation,  Especially when this wasn't the case while they were smoking, a red flag is raised based on the work of Dr Bland Bunnell which states:  if you quit smoking when your best day FEV1 is still well preserved it is highly unlikely you will progress to severe disease.  That is to say, once the smoking stops,  the symptoms should not suddenly erupt or markedly worsen.  If so, the differential diagnosis should include  obesity/deconditioning,  LPR/Reflux/Aspiration syndromes,  occult CHF(he has MR) , or  especially side effect of medications commonly used in this population (on DPIs with raspy upper airway cough)   Rec Change from dpi to breztri  trial basis plus approp saba: Re SABA :  I spent extra time with pt today reviewing appropriate use of albuterol  for prn use on exertion with the following points: 1) saba is for relief of sob that does not improve by walking a slower pace or resting but rather if the pt does not improve after trying this  first. 2) If the pt is convinced, as many are, that saba helps recover from activity faster then it's easy to tell if this is the case by re-challenging : ie stop, take the inhaler, then p 5 minutes try the exact same activity (intensity of workload) that just caused the symptoms and see if they are substantially diminished or not after saba 3) if there is an activity that reproducibly causes the symptoms, try the saba 15 min before the activity on alternate days   If in fact the saba really does help, then fine to continue to use it prn but advised may need to look closer at the maintenance regimen being used to achieve better control of airways disease with exertion.   If raspy cough no better off dpi then add gerd rx next  F/u with pfts next / consider repeat echo next.   I had an extended discussion with the patient reviewing all relevant studies completed to date and  lasting 15 to 20 minutes of a 25 minute visit re:  Each maintenance medication was reviewed in detail including emphasizing most importantly the difference between maintenance and prns and under what circumstances the prns are to be triggered using an action plan format where appropriate.  Total time for H and P, chart review, counseling, reviewing hfa/dpi device(s) , directly observing portions of ambulatory 02 saturation study/ and generating customized  AVS unique to this office visit / same day charting = 45 min new pt eval

## 2023-07-07 DIAGNOSIS — C61 Malignant neoplasm of prostate: Secondary | ICD-10-CM | POA: Diagnosis not present

## 2023-07-07 NOTE — Progress Notes (Signed)
Patient has reviewed results and recommendations via My Chart.

## 2023-07-20 ENCOUNTER — Other Ambulatory Visit: Payer: Self-pay | Admitting: Internal Medicine

## 2023-07-20 NOTE — Telephone Encounter (Signed)
 Copied from CRM [id number]. Topic: Clinical - Medication Refill >> Jul 20, 2023  4:38 PM Tianna S wrote: Medication: budesonide-glycopyrrolate -formoterol (BREZTRI  AEROSPHERE) 160-9-4.8 MCG/ACT AERO inhaler  Has the patient contacted their pharmacy? Yes (Agent: If no, request that the patient contact the pharmacy for the refill. If patient does not wish to contact the pharmacy document the reason why and proceed with request.) (Agent: If yes, when and what did the pharmacy advise?)  This is the patient's preferred pharmacy:  [id number] [place],  - [address] AT [facility] OF [facility] & [address] [place] [address] Phone: [phone] Fax: [phone]  Is this the correct pharmacy for this prescription? Yes If no, delete pharmacy and type the correct one.   Has the prescription been filled recently? Yes  Is the patient out of the medication? No  Has the patient been seen for an appointment in the last year OR does the patient have an upcoming appointment? Yes  Can we respond through MyChart? Yes  Agent: Please be advised that Rx refills may take up to 3 business days. We ask that you follow-up with your pharmacy.

## 2023-07-20 NOTE — Telephone Encounter (Signed)
-----   Message from Shona JAYSON Sora sent at 07/20/2023  4:41 PM EDT -----

## 2023-07-20 NOTE — Telephone Encounter (Unsigned)
 Copied from CRM [id number]. Topic: Clinical - Medication Refill >> Jul 20, 2023  5:55 PM Tianna S wrote: Communication Medication: budesonide-glycopyrrolate -formoterol (BREZTRI  AEROSPHERE) 160-9-4.8 MCG/ACT AERO inhaler    Has the patient contacted their pharmacy? Yes  (Agent: If no, request that the patient contact the pharmacy for the refill. If patient does not wish to contact the pharmacy document the reason why and proceed with request.)  (Agent: If yes, when and what did the pharmacy advise?)    This is the patient's preferred pharmacy:  [id number] [place], [place] - [address] AT [facility] OF [facility] & [address] [place] [address]  Phone: [phone] Fax: [phone]    Is this the correct pharmacy for this prescription? Yes  If no, delete pharmacy and type the correct one.    Has the prescription been filled recently? Yes    Is the patient out of the medication? No    Has the patient been seen for an appointment in the last year OR does the patient have an upcoming appointment? Yes    Can we respond through MyChart? Yes    Agent: Please be advised that Rx refills may take up to 3 business days. We ask that you follow-up with your pharmacy.

## 2023-07-21 DIAGNOSIS — C61 Malignant neoplasm of prostate: Secondary | ICD-10-CM | POA: Diagnosis not present

## 2023-07-21 MED ORDER — BREZTRI AEROSPHERE 160-9-4.8 MCG/ACT IN AERO: 2.0000 | INHALATION_SPRAY | Freq: Two times a day (BID) | RESPIRATORY_TRACT | 11 refills | Status: AC

## 2023-07-22 ENCOUNTER — Other Ambulatory Visit: Payer: Self-pay

## 2023-07-22 DIAGNOSIS — I10 Essential (primary) hypertension: Secondary | ICD-10-CM

## 2023-07-22 MED ORDER — LOSARTAN POTASSIUM 50 MG PO TABS: 50.0000 mg | ORAL_TABLET | Freq: Two times a day (BID) | ORAL | 1 refills | Status: DC

## 2023-08-04 ENCOUNTER — Other Ambulatory Visit: Payer: Self-pay | Admitting: Internal Medicine

## 2023-08-10 ENCOUNTER — Other Ambulatory Visit: Payer: Self-pay | Admitting: *Deleted

## 2023-08-10 DIAGNOSIS — R0609 Other forms of dyspnea: Secondary | ICD-10-CM

## 2023-08-10 DIAGNOSIS — J449 Chronic obstructive pulmonary disease, unspecified: Secondary | ICD-10-CM

## 2023-08-12 ENCOUNTER — Ambulatory Visit: Admitting: Internal Medicine

## 2023-08-12 DIAGNOSIS — R0609 Other forms of dyspnea: Secondary | ICD-10-CM

## 2023-08-12 DIAGNOSIS — J449 Chronic obstructive pulmonary disease, unspecified: Secondary | ICD-10-CM | POA: Diagnosis not present

## 2023-08-12 LAB — PULMONARY FUNCTION TEST
DL/VA % pred: 68 %
DL/VA: 2.64 ml/min/mmHg/L
DLCO cor % pred: 54 %
DLCO cor: 13.14 ml/min/mmHg
DLCO unc % pred: 52 %
DLCO unc: 12.71 ml/min/mmHg
FEF 25-75 Post: 0.96 L/s
FEF 25-75 Pre: 0.61 L/s
FEF2575-%Change-Post: 57 %
FEF2575-%Pred-Post: 52 %
FEF2575-%Pred-Pre: 33 %
FEV1-%Change-Post: 17 %
FEV1-%Pred-Post: 65 %
FEV1-%Pred-Pre: 55 %
FEV1-Post: 1.8 L
FEV1-Pre: 1.53 L
FEV1FVC-%Change-Post: 4 %
FEV1FVC-%Pred-Pre: 69 %
FEV6-%Change-Post: 13 %
FEV6-%Pred-Post: 95 %
FEV6-%Pred-Pre: 83 %
FEV6-Post: 3.45 L
FEV6-Pre: 3.04 L
FEV6FVC-%Change-Post: 0 %
FEV6FVC-%Pred-Post: 106 %
FEV6FVC-%Pred-Pre: 106 %
FVC-%Change-Post: 12 %
FVC-%Pred-Post: 89 %
FVC-%Pred-Pre: 79 %
FVC-Post: 3.47 L
FVC-Pre: 3.08 L
Post FEV1/FVC ratio: 52 %
Post FEV6/FVC ratio: 99 %
Pre FEV1/FVC ratio: 49 %
Pre FEV6/FVC Ratio: 99 %
RV % pred: 128 %
RV: 3.48 L
TLC % pred: 93 %
TLC: 6.63 L

## 2023-08-12 NOTE — Patient Instructions (Signed)
 Full PFT performed today.

## 2023-08-12 NOTE — Progress Notes (Signed)
 Full PFT performed today.

## 2023-08-15 ENCOUNTER — Ambulatory Visit: Payer: Self-pay | Admitting: Internal Medicine

## 2023-08-15 ENCOUNTER — Encounter: Payer: Self-pay | Admitting: Internal Medicine

## 2023-08-16 NOTE — Progress Notes (Signed)
 I called the pt and there was no answer and his voicemail box was full. Will call back.

## 2023-08-19 ENCOUNTER — Ambulatory Visit: Payer: Self-pay | Admitting: Internal Medicine

## 2023-08-19 ENCOUNTER — Other Ambulatory Visit

## 2023-08-19 DIAGNOSIS — E890 Postprocedural hypothyroidism: Secondary | ICD-10-CM | POA: Diagnosis not present

## 2023-08-19 LAB — TSH: TSH: 1.2 u[IU]/mL (ref 0.35–5.50)

## 2023-09-28 NOTE — Progress Notes (Unsigned)
 Preston Reeves, male    DOB: March 08, 1940   MRN: [id number]   Brief patient profile:  59 yowm quit smoking  2005 s obvious resp cc  referred to pulmonary clinic 07/06/2023 by Glade Hope  for persisstent  doe  onset was 2008 rx better with albuterol  s/p final RT of pelvis  July 2024 and generally more doe since.   History of Present Illness  07/06/2023  Pulmonary/ 1st office eval/Lesley Galentine  Chief Complaint  Patient presents with   Follow-up    S.O.B consult- Pt states this is with exertion. Minimum activity. Pt states his radiation made things worse. (2024)   Dyspnea:  p prostate ca noted doe mb and back  50 ft flat / groceries into house really wears him out, not impoved while on various dpis plus prn sab  Cough: raspy dry mostly daytime cough  Sleep: bed is slt elevated x 1 pillow  SABA use: not in sev days  02 ldz:wnwz   Rec Plan A = Automatic = Always=    Breztri  Take 2 puffs first thing in am and then another 2 puffs about 12 hours later(optional)   Work on inhaler technique:  Plan B = Backup (to supplement plan A, not to replace it) Only use your albuterol  inhaler as a rescue medication Cxr Background of chronic interstitial coarsening and bronchitic changes but no change in baseline   - PFT's  08/12/23  FEV1 1.80 (65 % ) ratio 0.52  p 17 % improvement from saba p Breatri prior to study with DLCO  12.7 (52%)   and FV curve classically concave      09/30/2023  f/u ov/Blaize Nipper re: doe  onset was 2008 / GOLD 2 copd   maint on breztri    Chief Complaint  Patient presents with   COPD    Pft follow up  Dyspnea: improved to his satisfaction Cough: none/ raspy voice  Sleeping: HOB  is elevated 20 degees one pillow s  resp cc  SABA use: none  02: none   No obvious day to day or daytime variability or assoc excess/ purulent sputum or mucus plugs or hemoptysis or cp or chest tightness, subjective wheeze or overt sinus or hb symptoms.    Also denies any obvious fluctuation of symptoms with weather  or environmental changes or other aggravating or alleviating factors except as outlined above   No unusual exposure hx or h/o childhood pna/ asthma or knowledge of premature birth.  Current Allergies, Complete Past Medical History, Past Surgical History, Family History, and Social History were reviewed in [employer] record.  ROS  The following are not active complaints unless bolded Hoarseness, sore throat, dysphagia, dental problems, itching, sneezing,  nasal congestion or discharge of excess mucus or purulent secretions, ear ache,   fever, chills, sweats, unintended wt loss or wt gain, classically pleuritic or exertional cp,  orthopnea pnd or arm/hand swelling  or leg swelling, presyncope, palpitations, abdominal pain, anorexia, nausea, vomiting, diarrhea  or change in bowel habits or change in bladder habits, change in stools or change in urine, dysuria, hematuria,  rash, arthralgias, visual complaints, headache, numbness, weakness or ataxia or problems with walking or coordination,  change in mood or  memory.        Current Meds  Medication Sig   acetaminophen  (TYLENOL ) 500 MG tablet Take 500 mg by mouth every 6 (six) hours as needed for mild pain.   albuterol  (VENTOLIN  HFA) 108 (90 Base) MCG/ACT inhaler INHALE  1 TO 2 PUFFS INTO THE LUNGS EVERY 6 HOURS AS NEEDED FOR WHEEZING OR SHORTNESS OF BREATH   Ascorbic Acid  (VITAMIN C ) 1000 MG tablet Take 1,000 mg by mouth daily.   budesonide-glycopyrrolate -formoterol (BREZTRI  AEROSPHERE) 160-9-4.8 MCG/ACT AERO inhaler Inhale 2 puffs into the lungs in the morning and at bedtime.   Calcium  Carbonate-Vitamin D  (CALCIUM  + D PO) Take 1 tablet by mouth daily.    Docusate Sodium  (COLACE PO) Take 200 mg by mouth in the morning and at bedtime.   fexofenadine (ALLEGRA) 180 MG tablet Take 180 mg by mouth daily.   FIBER PO Take by mouth in the morning and at bedtime.   fluticasone  (FLONASE ) 50 MCG/ACT nasal spray Place 2 sprays into both  nostrils daily.   Glucosamine-Chondroit-Vit C-Mn (GLUCOSAMINE 1500 COMPLEX PO) Take by mouth in the morning and at bedtime.   losartan  (COZAAR ) 50 MG tablet Take 1 tablet (50 mg total) by mouth 2 (two) times daily.   metoprolol  succinate (TOPROL -XL) 50 MG 24 hr tablet TAKE 1 TABLET(50 MG) BY MOUTH DAILY WITH OR IMMEDIATELY FOLLOWING A MEAL   Multiple Vitamins-Minerals (MULTIVITAMIN PO) Take 1 tablet by mouth daily.    Omega-3 Fatty Acids (FISH OIL PO) Take 1,000 mg by mouth in the morning and at bedtime.   Red Yeast Rice 600 MG CAPS Take 1,200 mg by mouth daily.    SALINE NASAL SPRAY NA Place 1 spray into the nose daily as needed (allergies).    SYNTHROID  137 MCG tablet Take 1 tablet (137 mcg total) by mouth daily before breakfast.   vitamin B-12 (CYANOCOBALAMIN) 1000 MCG tablet Take 1,000 mcg by mouth daily.           Past Medical History:  Diagnosis Date   Allergy    SEASONAL   Asthma    Cataract    Colon polyps    Adenomatous   COPD (chronic obstructive pulmonary disease) (HCC)    Diverticulitis of colon with perforation 2010   Colectomy/ostomy   Diverticulosis    hx of   Dysrhythmia    Hearing aid worn    bilaterally   Heart murmur    Hemochromatosis    hx of   History of BPH    History of COPD    Hyperlipidemia    Hypertension    Hypothyroidism    Incisional hernia    MVP (mitral valve prolapse)    Thyroid  disease       Objective:     Wt Readings from Last 3 Encounters:  09/30/23 185 lb 3.2 oz (84 kg)  07/06/23 186 lb 9.6 oz (84.6 kg)  07/01/23 187 lb (84.8 kg)      Vital signs reviewed  09/30/2023  - Note at rest 02 sats  95% on RA   General appearance:    amb wm nad          HEENT : Oropharynx  clear   Nasal turbinates nl    NECK :  without  apparent JVD/ palpable Nodes/TM    LUNGS: no acc muscle use,  Min barrel  contour chest wall with bilateral  slightly decreased bs s audible wheeze and  without cough on insp or exp maneuvers and min   Hyperresonant  to  percussion bilaterally    CV:  RRR  no s3  2-3 / 6 HSM s  increase in P2, and no edema   ABD:  soft and nontender with pos end  insp Hoover's  in the supine position.  No bruits or organomegaly appreciated   MS:  Nl gait/ ext warm without deformities Or obvious joint restrictions  calf tenderness, cyanosis or clubbing     SKIN: warm and dry without lesions    NEURO:  alert, approp, nl sensorium with  no motor or cerebellar deficits apparent.             .    Assessment     Assessment & Plan COPD GOLD 2 Quit smoking 2005 with PFT's c/w GOLD 2 08/12/23 with asthma component - Symptom Onset  2008 with ? Asthma around cats (clinical dx by son, ER MD , better  with saba - Echo 06/08/22.  LV EF  60 to 65%   Grade I diastolic dysfunction   - 07/06/2023  After extensive coaching inhaler device,  effectiveness =    90% with HFA so try breztri  2 each am pending pfts and d/c dpi's - 07/06/2023   Walked on RA  x  3  lap(s) =  approx 750  ft  @ mod to fast  pace, stopped due to end of study  with lowest 02 sats 92% and no doe  - PFT's  08/12/23  FEV1 1.80 (65 % ) ratio 0.52  p 17 % improvement from saba p Breztri  prior to study with DLCO  12.7 (52%)   and FV curve classically concave    Pt only  has GOLD 2 but  Group D (now reclassified as E) in terms of symptom/risk and laba/lama/ICS  therefore appropriate rx at this point >>>  continue breztri  2bid and approp saba:  Re SABA :  I spent extra time with pt today reviewing appropriate use of albuterol  for prn use on exertion with the following points: 1) saba is for relief of sob that does not improve by walking a slower pace or resting but rather if the pt does not improve after trying this first. 2) If the pt is convinced, as many are, that saba helps recover from activity faster then it's easy to tell if this is the case by re-challenging : ie stop, take the inhaler, then p 5 minutes try the exact same activity (intensity of  workload) that just caused the symptoms and see if they are substantially diminished or not after saba 3) if there is an activity that reproducibly causes the symptoms, try the saba 15 min before the activity on alternate days   If in fact the saba really does help, then fine to continue to use it prn but advised may need to look closer at the maintenance regimen being used to achieve better control of airways disease with exertion.   F/u in 9 months (his bad time of the year)  then yearly same time   Each maintenance medication was reviewed in detail including emphasizing most importantly the difference between maintenance and prns and under what circumstances the prns are to be triggered using an action plan format where appropriate.  Total time for H and P, chart review, counseling, reviewing hfa  device(s) and generating customized AVS unique to this office visit / same day charting = 22 min          AVS  Patient Instructions  Also  Ok to try albuterol  15 min before an activity (on alternating days)  that you know would usually make you short of breath and see if it makes any difference and if makes none then don't take albuterol  after activity unless you can't catch your breath  as this means it's the resting that helps, not the albuterol .      Please schedule a follow up visit in 9 months but call sooner if needed    Ozell America, MD 10/01/2023

## 2023-09-30 ENCOUNTER — Encounter: Payer: Self-pay | Admitting: Internal Medicine

## 2023-09-30 ENCOUNTER — Ambulatory Visit: Admitting: Internal Medicine

## 2023-09-30 VITALS — BP 140/77 | HR 71 | Temp 98.0°F | Ht 70.0 in | Wt 185.2 lb

## 2023-09-30 DIAGNOSIS — R0609 Other forms of dyspnea: Secondary | ICD-10-CM

## 2023-09-30 DIAGNOSIS — Z87891 Personal history of nicotine dependence: Secondary | ICD-10-CM

## 2023-09-30 DIAGNOSIS — J449 Chronic obstructive pulmonary disease, unspecified: Secondary | ICD-10-CM

## 2023-09-30 MED ORDER — ALBUTEROL SULFATE HFA 108 (90 BASE) MCG/ACT IN AERS: 1.0000 | INHALATION_SPRAY | RESPIRATORY_TRACT | 8 refills | Status: AC | PRN

## 2023-09-30 NOTE — Patient Instructions (Addendum)
 Also  Ok to try albuterol  15 min before an activity (on alternating days)  that you know would usually make you short of breath and see if it makes any difference and if makes none then don't take albuterol  after activity unless you can't catch your breath as this means it's the resting that helps, not the albuterol .      Please schedule a follow up visit in 9 months but call sooner if needed

## 2023-10-01 NOTE — Assessment & Plan Note (Addendum)
 Quit smoking 2005 with PFT's c/w GOLD 2 08/12/23 with asthma component - Symptom Onset  2008 with ? Asthma around cats (clinical dx by son, ER MD , better  with saba - Echo 06/08/22.  LV EF  60 to 65%   Grade I diastolic dysfunction   - 07/06/2023  After extensive coaching inhaler device,  effectiveness =    90% with HFA so try breztri  2 each am pending pfts and d/c dpi's - 07/06/2023   Walked on RA  x  3  lap(s) =  approx 750  ft  @ mod to fast  pace, stopped due to end of study  with lowest 02 sats 92% and no doe  - PFT's  08/12/23  FEV1 1.80 (65 % ) ratio 0.52  p 17 % improvement from saba p Breztri  prior to study with DLCO  12.7 (52%)   and FV curve classically concave    Pt only  has GOLD 2 but  Group D (now reclassified as E) in terms of symptom/risk and laba/lama/ICS  therefore appropriate rx at this point >>>  continue breztri  2bid and approp saba:  Re SABA :  I spent extra time with pt today reviewing appropriate use of albuterol  for prn use on exertion with the following points: 1) saba is for relief of sob that does not improve by walking a slower pace or resting but rather if the pt does not improve after trying this first. 2) If the pt is convinced, as many are, that saba helps recover from activity faster then it's easy to tell if this is the case by re-challenging : ie stop, take the inhaler, then p 5 minutes try the exact same activity (intensity of workload) that just caused the symptoms and see if they are substantially diminished or not after saba 3) if there is an activity that reproducibly causes the symptoms, try the saba 15 min before the activity on alternate days   If in fact the saba really does help, then fine to continue to use it prn but advised may need to look closer at the maintenance regimen being used to achieve better control of airways disease with exertion.   F/u in 9 months (his bad time of the year)  then yearly same time   Each maintenance medication was reviewed  in detail including emphasizing most importantly the difference between maintenance and prns and under what circumstances the prns are to be triggered using an action plan format where appropriate.  Total time for H and P, chart review, counseling, reviewing hfa  device(s) and generating customized AVS unique to this office visit / same day charting = 22 min

## 2023-10-08 ENCOUNTER — Encounter (INDEPENDENT_AMBULATORY_CARE_PROVIDER_SITE_OTHER): Payer: Self-pay | Admitting: Otolaryngology

## 2023-10-08 ENCOUNTER — Ambulatory Visit (INDEPENDENT_AMBULATORY_CARE_PROVIDER_SITE_OTHER): Payer: PPO | Admitting: Otolaryngology

## 2023-10-08 ENCOUNTER — Ambulatory Visit (INDEPENDENT_AMBULATORY_CARE_PROVIDER_SITE_OTHER): Payer: Self-pay | Admitting: Audiology

## 2023-10-08 VITALS — BP 155/81 | HR 84

## 2023-10-08 DIAGNOSIS — H6123 Impacted cerumen, bilateral: Secondary | ICD-10-CM

## 2023-10-08 DIAGNOSIS — H903 Sensorineural hearing loss, bilateral: Secondary | ICD-10-CM

## 2023-10-08 DIAGNOSIS — H9011 Conductive hearing loss, unilateral, right ear, with unrestricted hearing on the contralateral side: Secondary | ICD-10-CM

## 2023-10-08 NOTE — Progress Notes (Signed)
  [address] [place], [place] [address] [phone]  Hearing Aid Check     Preston Reeves asked for some supplies while he came to see Dr. Karis.  Supplies were in an identified blue tray waiting for him to pick them up. It seems that he never received our phone call . Additional domes and 2 pack of filters were provided.   Services fee: $0 was paid at checkout.  Recommend: Return for a hearing aid check , as needed. Return for a hearing evaluation and to see an ENT, if concerns with hearing changes arise.    Liesa Tsan MARIE LEROUX-MARTINEZ, AUD

## 2023-10-08 NOTE — Progress Notes (Unsigned)
 Patient ID: Preston Reeves, male   DOB: [birth year], 83 y.o.   MRN: [id number]  Follow-up: Hearing loss  HPI: The patient is an 83 year old male who returns today for follow-up evaluation of his hearing loss.  The patient was last seen 1 year ago.  At that time, he was complaining of hearing difficulty.  He was noted to have cerumen impaction and bilateral sensorineural hearing loss, in addition to right ear conductive hearing loss.  The patient was treated with cerumen disimpaction and hearing amplification.  According to the patient, he continues to have hearing difficulty.  The hearing aids have helped.  He denies any otalgia, otorrhea, or vertigo.  Exam: General: Communicates without difficulty, well nourished, no acute distress. Head: Normocephalic, no evidence injury, no tenderness, facial buttresses intact without stepoff. Face/sinus: No tenderness to palpation and percussion. Facial movement is normal and symmetric. Eyes: PERRL, EOMI. No scleral icterus, conjunctivae clear. Neuro: CN II exam reveals vision grossly intact.  No nystagmus at any point of gaze. EAC: Bilateral cerumen impaction.  Under the operating microscope, the cerumen is carefully removed with a combination of cerumen currette, alligator forceps, and suction catheters.  After the cerumen is removed, the TMs are noted to be normal. Nose: External evaluation reveals normal support and skin without lesions.  Dorsum is intact.  Anterior rhinoscopy reveals pink mucosa over anterior aspect of inferior turbinates and intact septum.  No purulence noted. Oral:  Oral cavity and oropharynx are intact, symmetric, without erythema or edema.  Mucosa is moist without lesions. Neck: Full range of motion without pain.  There is no significant lymphadenopathy.  No masses palpable.  Thyroid  bed within normal limits to palpation.  Parotid glands and submandibular glands equal bilaterally without mass.  Trachea is midline. Neuro:  CN 2-12 grossly intact.  Gait normal.   Procedure: Bilateral cerumen disimpaction Anesthesia: None Description: Under the operating microscope, the cerumen is carefully removed with a combination of cerumen currette, alligator forceps, and suction catheters.  After the cerumen is removed, the TMs are noted to be normal.  No mass, erythema, or lesions. The patient tolerated the procedure well.   Assessment: 1.  Bilateral cerumen impaction.  After the disimpaction procedure, both tympanic membranes and middle ear spaces are noted to be normal.  2.  Progressive bilateral high-frequency sensorineural hearing loss, with additional right ear conductive hearing loss.  Plan: 1.  Otomicroscopy with bilateral cerumen disimpaction.  2.  The physical exam findings are reviewed with the patient.  3.  Continue the use of his hearing aids. 4.  The patient will return for re-evaluation in 1 year with repeat hearing test.  No audiologist is available today.

## 2023-10-10 DIAGNOSIS — H6123 Impacted cerumen, bilateral: Secondary | ICD-10-CM | POA: Insufficient documentation

## 2023-10-10 DIAGNOSIS — H903 Sensorineural hearing loss, bilateral: Secondary | ICD-10-CM | POA: Insufficient documentation

## 2023-11-09 ENCOUNTER — Other Ambulatory Visit: Payer: Self-pay | Admitting: Internal Medicine

## 2023-11-09 NOTE — Telephone Encounter (Unsigned)
 Copied from CRM [id number]. Topic: Clinical - Medication Refill >> Nov 09, 2023  4:53 PM Shereese L wrote: Medication: metoprolol  succinate (TOPROL -XL) 50 MG 24 hr tablet  Has the patient contacted their pharmacy? Yes (Agent: If no, request that the patient contact the pharmacy for the refill. If patient does not wish to contact the pharmacy document the reason why and proceed with request.) (Agent: If yes, when and what did the pharmacy advise?)  This is the patient's preferred pharmacy:   [facility] [address], [place] - [id number] [address] [facility] [address]  [address] [address] [address] [place] [address] Phone: [phone] Fax: [phone]  Is this the correct pharmacy for this prescription? Yes If no, delete pharmacy and type the correct one.   Has the prescription been filled recently? Yes  Is the patient out of the medication? Yes  Has the patient been seen for an appointment in the last year OR does the patient have an upcoming appointment? Yes  Can we respond through MyChart? Yes  Agent: Please be advised that Rx refills may take up to 3 business days. We ask that you follow-up with your pharmacy.

## 2023-11-10 MED ORDER — METOPROLOL SUCCINATE ER 50 MG PO TB24: 50.0000 mg | ORAL_TABLET | Freq: Every day | ORAL | 0 refills | Status: DC

## 2023-11-22 ENCOUNTER — Ambulatory Visit: Payer: Self-pay

## 2023-11-22 ENCOUNTER — Encounter: Payer: Self-pay | Admitting: Family Medicine

## 2023-11-22 ENCOUNTER — Ambulatory Visit: Admitting: Family Medicine

## 2023-11-22 VITALS — BP 130/58 | HR 90 | Temp 97.7°F | Resp 20 | Ht 70.0 in | Wt 184.0 lb

## 2023-11-22 DIAGNOSIS — J441 Chronic obstructive pulmonary disease with (acute) exacerbation: Secondary | ICD-10-CM

## 2023-11-22 MED ORDER — AMOXICILLIN-POT CLAVULANATE 875-125 MG PO TABS: 1.0000 | ORAL_TABLET | Freq: Two times a day (BID) | ORAL | 0 refills | Status: AC

## 2023-11-22 MED ORDER — PREDNISONE 20 MG PO TABS: 40.0000 mg | ORAL_TABLET | Freq: Every day | ORAL | 0 refills | Status: AC

## 2023-11-22 NOTE — Progress Notes (Signed)
 Assessment & Plan COPD exacerbation (HCC) - Education provided on COPD exacerbations. - Continue Breztri  inhaler twice daily. - Use albuterol  inhaler as needed for wheezing or dyspnea. - Continue DayQuil and NyQuil for symptom management.  Orders:   amoxicillin -clavulanate (AUGMENTIN ) 875-125 MG tablet; Take 1 tablet by mouth 2 (two) times daily for 7 days.   predniSONE  (DELTASONE ) 20 MG tablet; Take 2 tablets (40 mg total) by mouth daily for 5 days.    Follow up plan: Return if symptoms worsen or fail to improve.  Niki Rung, MSN, APRN, FNP-C  Subjective:  HPI: Preston Reeves is a 83 y.o. male presenting on 11/22/2023 for Cough (X 1 week with green production, nasal congestion, SOB, no fever, no GI, neg tests for flu and covid /)  Discussed the use of AI scribe software for clinical note transcription with the patient, who gave verbal consent to proceed.  He has been experiencing a cough for about a week, accompanied by shortness of breath. He notes nasal discharge but denies fever or gastrointestinal symptoms.  He has a history of COPD and uses Breztri  inhaler twice daily. He also has albuterol  but uses it only occasionally, even when symptomatic. He has been managing his symptoms with over-the-counter medications, including Nyquil, DayQuil, and Tylenol .       ROS: Negative unless specifically indicated above in HPI.   Relevant past medical history reviewed and updated as indicated.   Allergies and medications reviewed and updated.   Current Outpatient Medications:    acetaminophen  (TYLENOL ) 500 MG tablet, Take 500 mg by mouth every 6 (six) hours as needed for mild pain., Disp: , Rfl:    albuterol  (VENTOLIN  HFA) 108 (90 Base) MCG/ACT inhaler, Inhale 1-2 puffs into the lungs every 4 (four) hours as needed for wheezing or shortness of breath., Disp: 18 g, Rfl: 8   amoxicillin -clavulanate (AUGMENTIN ) 875-125 MG tablet, Take 1 tablet by mouth 2 (two) times daily for 7  days., Disp: 14 tablet, Rfl: 0   Ascorbic Acid  (VITAMIN C ) 1000 MG tablet, Take 1,000 mg by mouth daily., Disp: , Rfl:    budesonide-glycopyrrolate -formoterol (BREZTRI  AEROSPHERE) 160-9-4.8 MCG/ACT AERO inhaler, Inhale 2 puffs into the lungs in the morning and at bedtime., Disp: 10.7 g, Rfl: 11   Calcium  Carbonate-Vitamin D  (CALCIUM  + D PO), Take 1 tablet by mouth daily. , Disp: , Rfl:    Docusate Sodium  (COLACE PO), Take 200 mg by mouth in the morning and at bedtime., Disp: , Rfl:    fexofenadine (ALLEGRA) 180 MG tablet, Take 180 mg by mouth daily., Disp: , Rfl:    FIBER PO, Take by mouth in the morning and at bedtime., Disp: , Rfl:    fluticasone  (FLONASE ) 50 MCG/ACT nasal spray, Place 2 sprays into both nostrils daily., Disp: 16 g, Rfl: 6   Glucosamine-Chondroit-Vit C-Mn (GLUCOSAMINE 1500 COMPLEX PO), Take by mouth in the morning and at bedtime., Disp: , Rfl:    losartan  (COZAAR ) 50 MG tablet, Take 1 tablet (50 mg total) by mouth 2 (two) times daily., Disp: 180 tablet, Rfl: 1   metoprolol  succinate (TOPROL -XL) 50 MG 24 hr tablet, Take 1 tablet (50 mg total) by mouth daily. Take with or immediately following a meal., Disp: 90 tablet, Rfl: 0   Multiple Vitamins-Minerals (MULTIVITAMIN PO), Take 1 tablet by mouth daily. , Disp: , Rfl:    Omega-3 Fatty Acids (FISH OIL PO), Take 1,000 mg by mouth in the morning and at bedtime., Disp: , Rfl:  predniSONE  (DELTASONE ) 20 MG tablet, Take 2 tablets (40 mg total) by mouth daily for 5 days., Disp: 10 tablet, Rfl: 0   Red Yeast Rice 600 MG CAPS, Take 1,200 mg by mouth daily. , Disp: , Rfl:    SALINE NASAL SPRAY NA, Place 1 spray into the nose daily as needed (allergies). , Disp: , Rfl:    SYNTHROID  137 MCG tablet, Take 1 tablet (137 mcg total) by mouth daily before breakfast., Disp: 90 tablet, Rfl: 3   vitamin B-12 (CYANOCOBALAMIN) 1000 MCG tablet, Take 1,000 mcg by mouth daily., Disp: , Rfl:   No Known Allergies  Objective:   BP (!) 130/58   Pulse 90    Temp 97.7 F (36.5 C)   Resp 20   Ht 5' 10 (1.778 m)   Wt 184 lb (83.5 kg)   SpO2 97%   BMI 26.40 kg/m    Physical Exam Vitals reviewed.  Constitutional:      General: He is not in acute distress.    Appearance: Normal appearance. He is not ill-appearing, toxic-appearing or diaphoretic.  HENT:     Head: Normocephalic and atraumatic.     Right Ear: Tympanic membrane, ear canal and external ear normal. There is no impacted cerumen.     Left Ear: Tympanic membrane, ear canal and external ear normal. There is no impacted cerumen.     Nose: Nose normal.     Right Sinus: No maxillary sinus tenderness or frontal sinus tenderness.     Left Sinus: No maxillary sinus tenderness or frontal sinus tenderness.     Mouth/Throat:     Mouth: Mucous membranes are moist.     Pharynx: Oropharynx is clear. No oropharyngeal exudate or posterior oropharyngeal erythema.     Tonsils: No tonsillar exudate or tonsillar abscesses.  Eyes:     General: No scleral icterus.       Right eye: No discharge.        Left eye: No discharge.     Conjunctiva/sclera: Conjunctivae normal.  Cardiovascular:     Rate and Rhythm: Normal rate.  Pulmonary:     Effort: Pulmonary effort is normal. No respiratory distress.  Musculoskeletal:        General: Normal range of motion.     Cervical back: Normal range of motion.  Lymphadenopathy:     Cervical: No cervical adenopathy.  Skin:    General: Skin is warm and dry.  Neurological:     Mental Status: He is alert and oriented to person, place, and time. Mental status is at baseline.  Psychiatric:        Mood and Affect: Mood normal.        Behavior: Behavior normal.        Thought Content: Thought content normal.        Judgment: Judgment normal.

## 2023-11-22 NOTE — Telephone Encounter (Signed)
  FYI Only or Action Required?: FYI only for provider.  Patient was last seen in primary care on 07/01/2023 by Geofm Glade PARAS, MD.  Called Nurse Triage reporting Cough.  Symptoms began a week ago.  Interventions attempted: OTC medications: Dayquil and Nyquil.  Symptoms are: gradually worsening.  Triage Disposition: See Physician Within 24 Hours  Patient/caregiver understands and will follow disposition?: Yes  Copied from CRM #[id number]. Topic: Clinical - Red Word Triage >> Nov 22, 2023  8:27 AM Olam RAMAN wrote: Red Word that prompted transfer to Nurse Triage: Pt has a cold since last Monday Has phlegm, shortness of breath Reason for Disposition  [1] Continuous (nonstop) coughing interferes with work or school AND [2] no improvement using cough treatment per Care Advice  Answer Assessment - Initial Assessment Questions 1. ONSET: When did the cough begin?      1 week ago  2. SEVERITY: How bad is the cough today?      Getting ago  3. SPUTUM: Describe the color of your sputum (e.g., none, dry cough; clear, white, yellow, green)     Green   4. HEMOPTYSIS: Are you coughing up any blood? If Yes, ask: How much? (e.g., flecks, streaks, tablespoons, etc.)     No  5. DIFFICULTY BREATHING: Are you having difficulty breathing? If Yes, ask: How bad is it? (e.g., mild, moderate, severe)      No  6. FEVER: Do you have a fever? If Yes, ask: What is your temperature, how was it measured, and when did it start?     No  7. CARDIAC HISTORY: Do you have any history of heart disease? (e.g., heart attack, congestive heart failure)      No  8. LUNG HISTORY: Do you have any history of lung disease?  (e.g., pulmonary embolus, asthma, emphysema)     COPD  9. PE RISK FACTORS: Do you have a history of blood clots? (or: recent major surgery, recent prolonged travel, bedridden)     no 10. OTHER SYMPTOMS: Do you have any other symptoms? (e.g., runny nose, wheezing, chest pain)        No  11. PREGNANCY: Is there any chance you are pregnant? When was your last menstrual period?       No 12. TRAVEL: Have you traveled out of the country in the last month? (e.g., travel history, exposures)       *No Answer*  Protocols used: Cough - Acute Productive-A-AH

## 2023-11-25 ENCOUNTER — Ambulatory Visit (INDEPENDENT_AMBULATORY_CARE_PROVIDER_SITE_OTHER): Payer: Self-pay | Admitting: Audiology

## 2023-11-25 ENCOUNTER — Telehealth (INDEPENDENT_AMBULATORY_CARE_PROVIDER_SITE_OTHER): Payer: Self-pay

## 2023-11-25 ENCOUNTER — Encounter (INDEPENDENT_AMBULATORY_CARE_PROVIDER_SITE_OTHER): Payer: Self-pay | Admitting: Audiology

## 2023-11-25 ENCOUNTER — Telehealth (INDEPENDENT_AMBULATORY_CARE_PROVIDER_SITE_OTHER): Payer: Self-pay | Admitting: Otolaryngology

## 2023-11-25 DIAGNOSIS — H903 Sensorineural hearing loss, bilateral: Secondary | ICD-10-CM

## 2023-11-25 NOTE — Telephone Encounter (Signed)
 Spoke with Pt and let them know that he can drop off his hearing aids and we would call him when they are ready. Pt understood and is bringing them up today.

## 2023-11-25 NOTE — Telephone Encounter (Signed)
 Pt called leaving a VM that he needs a new battery for his hearing aids according to his warranty he can not changed them his self. Do I need to have him drop them off?

## 2023-11-25 NOTE — Telephone Encounter (Signed)
 Was returning Pt Vm that was left  Pt did no answer so I LVM to have him call us  back.

## 2023-11-25 NOTE — Telephone Encounter (Signed)
 Spoke with Pt explained that he can drop off hearing aid if its something we can fix in office we will but if we can't then we will have to send them off.

## 2023-11-25 NOTE — Telephone Encounter (Signed)
 I returned the patient's voicemail reporting a hearing aid issue. I let him know he can either drop off the hearing aid(s) and we could call when they are fixed, or we can schedule an appointment with the provider to meet with him. I requested a call back at [phone].

## 2023-11-26 ENCOUNTER — Telehealth (INDEPENDENT_AMBULATORY_CARE_PROVIDER_SITE_OTHER): Payer: Self-pay | Admitting: Audiology

## 2023-11-26 ENCOUNTER — Encounter (INDEPENDENT_AMBULATORY_CARE_PROVIDER_SITE_OTHER): Payer: Self-pay

## 2023-11-26 NOTE — Telephone Encounter (Signed)
 Called patient per Dr. Tex request to let him know that Dr. Tiney was able to look at his hearing aid.  She replaced the receiver wire and battery and the hearing aid appears to be working better.  She requested the hearing aid be picked up by 4pm so that the patient can try it on to see if it sounds ok.  If not, the hearing aid can be sent out to [facility] for repair.  There is $0 due since the hearing aid is under warranty until 07/11/2024.  Also sent a MyChart msg with the same information.

## 2023-11-26 NOTE — Progress Notes (Signed)
  [address] [place], [place] [address] [phone]  Hearing Aid Check     Preston Reeves walked in to drop off his right hearing aid.    Right Left  Hearing aid manufacturer [employer] Zircon 2  [id number] [employer] Zircon 2  [id number]  Hearing aid style Receiver in the canal Receiver in the canal  Hearing aid battery rechargeable rechargeable  Receiver 3- 100   Dome/ custom earpiece 10mm vented dome    Retention wire    Warranty expiration date 07-11-2024 07-11-2024  Loss and Damage unknown unknown  Additional accessories Expiration date    Initial fitting date 06-13-2021 06-13-2021  [facility]: [facility]    Chief complaint: Patient reports that the aid will not pair with the phone or left aid since 11-14-2023. Also reports that battery lasts at most one hour.  Actions taken: Inspection of the device and listening check showed that the aid was not working. Replaced battery in office with a new one, also had to replace the receiver wire  Services fee: $0 was paid at checkout.  Patient will be called to pick up the hearing aid at his convenience.  Recommend: Return for a hearing aid check , as needed. Return for a hearing evaluation and to see an ENT, if concerns with hearing changes arise.    Elanor Cale MARIE LEROUX-MARTINEZ, AUD

## 2023-11-29 ENCOUNTER — Telehealth (INDEPENDENT_AMBULATORY_CARE_PROVIDER_SITE_OTHER): Payer: Self-pay

## 2023-11-29 ENCOUNTER — Telehealth (INDEPENDENT_AMBULATORY_CARE_PROVIDER_SITE_OTHER): Payer: Self-pay | Admitting: Audiology

## 2023-11-29 ENCOUNTER — Ambulatory Visit (INDEPENDENT_AMBULATORY_CARE_PROVIDER_SITE_OTHER): Payer: Self-pay | Admitting: Audiology

## 2023-11-29 ENCOUNTER — Encounter (INDEPENDENT_AMBULATORY_CARE_PROVIDER_SITE_OTHER): Payer: Self-pay

## 2023-11-29 DIAGNOSIS — H9193 Unspecified hearing loss, bilateral: Secondary | ICD-10-CM

## 2023-11-29 NOTE — Telephone Encounter (Signed)
 Patient called and LVM on 11/28/2023 @ 4:10pm.  I returned the patient's call on 11/29/2023 and LVM to let him know that his hearing aid is ready for pickup.  I suggested he pick up the hearing aid by 4pm on Monday, Tuesday, Thursday or Friday or by 11am on Wednesday because Dr. Tiney is only in the clinic in the morning on Wednesdays.  I left my phone # should he have any questions.

## 2023-11-29 NOTE — Telephone Encounter (Signed)
 Returned patient's call regarding his hearing aid.  I left another voicemail message stating that his hearing aid was ready for pickup.

## 2023-11-29 NOTE — Progress Notes (Signed)
 Lynwood DELENA Hummer walked in to pick up his right hearing aid.       Right Left  Hearing aid manufacturer Oticon Zircon 2  [id number] [employer] Zircon 2  [id number]  Hearing aid style Receiver in the canal Receiver in the canal  Hearing aid battery rechargeable rechargeable  Receiver 3- 100    Dome/ custom earpiece 10mm vented dome    Retention wire      Warranty expiration date 07-11-2024 07-11-2024  Loss and Damage unknown unknown  Additional accessories Expiration date      Initial fitting date 06-13-2021 06-13-2021  Device was [facility]: [facility]          Actions taken: Ms. Wylie provided the patient the hearing aid and patient was able to hear with it. He will call [employer] support for the phone connectivity issue.   Services fee: $0 was paid at checkout.       Recommend: Return for a hearing aid check , as needed. Return for a hearing evaluation and to see an ENT, if concerns with hearing changes arise.

## 2023-11-30 ENCOUNTER — Telehealth (INDEPENDENT_AMBULATORY_CARE_PROVIDER_SITE_OTHER): Payer: Self-pay

## 2023-11-30 NOTE — Telephone Encounter (Signed)
 Patient LVM concerning his hearing aids. Patient mentions a connect clip to make his hearing aids work. Randine do you know anything about this?

## 2023-12-01 ENCOUNTER — Encounter (INDEPENDENT_AMBULATORY_CARE_PROVIDER_SITE_OTHER): Payer: Self-pay

## 2023-12-01 NOTE — Telephone Encounter (Signed)
 I scheduled a Hearing Aid Check with Dr. Tiney on 12/02/2023 @ 3:30pm.  Called the patient and LVM on 11/30/2023 & 12/01/2023 to confirm he is able to make the appointment to try to connect his hearing aids to his phone.  Also sent patient a MyChart msg.

## 2023-12-02 ENCOUNTER — Telehealth (INDEPENDENT_AMBULATORY_CARE_PROVIDER_SITE_OTHER): Payer: Self-pay | Admitting: Audiology

## 2023-12-02 ENCOUNTER — Ambulatory Visit (INDEPENDENT_AMBULATORY_CARE_PROVIDER_SITE_OTHER): Admitting: Audiology

## 2023-12-02 NOTE — Telephone Encounter (Signed)
 Called patient to try to confirm the Hearing Aid Check appt this afternoon to address the issue of his hearing aids not connecting to his phone.  Spoke w/ patient's wife.  She stated the issue has been resolved and he no longer needs the appt.  Appt cancelled.

## 2023-12-27 ENCOUNTER — Ambulatory Visit: Attending: Cardiovascular Disease | Admitting: Cardiovascular Disease

## 2023-12-27 ENCOUNTER — Encounter: Payer: Self-pay | Admitting: Cardiovascular Disease

## 2023-12-27 VITALS — BP 128/64 | HR 68 | Ht 70.0 in | Wt 184.4 lb

## 2023-12-27 DIAGNOSIS — I340 Nonrheumatic mitral (valve) insufficiency: Secondary | ICD-10-CM

## 2023-12-27 DIAGNOSIS — I10 Essential (primary) hypertension: Secondary | ICD-10-CM | POA: Diagnosis not present

## 2023-12-27 DIAGNOSIS — E785 Hyperlipidemia, unspecified: Secondary | ICD-10-CM

## 2023-12-27 DIAGNOSIS — I447 Left bundle-branch block, unspecified: Secondary | ICD-10-CM | POA: Diagnosis not present

## 2023-12-27 MED ORDER — METOPROLOL SUCCINATE ER 50 MG PO TB24: 50.0000 mg | ORAL_TABLET | Freq: Every day | ORAL | 3 refills | Status: AC

## 2023-12-27 NOTE — Patient Instructions (Signed)

## 2023-12-27 NOTE — Progress Notes (Signed)
 Chief Complaint  Patient presents with   Follow-up    Mitral valve insufficiency   History of Present Illness: 83 yo male with history of COPD, HTN, HLD, mitral valve prolapse, hemochromatosis, hypothyroidism with prior thyrotoxicosis s/p RAI and prostate cancer who is here today for follow up. He has been followed in our office by Dr. Hobart. Echo May 2024 with LVEF=60-65%, grade 1 diastolic dysfunction, trivial MR.   He is here today for follow up. The patient denies any chest pain, palpitations, lower extremity edema, orthopnea, PND, dizziness, near syncope or syncope. Baseline dyspnea with chronic COPD.   Primary Care Physician: Geofm Glade PARAS, MD   Past Medical History:  Diagnosis Date   Allergy    SEASONAL   Asthma    Cataract    Colon polyps    Adenomatous   COPD (chronic obstructive pulmonary disease) (HCC)    Diverticulitis of colon with perforation 2010   Colectomy/ostomy   Diverticulosis    hx of   Dysrhythmia    Hearing aid worn    bilaterally   Heart murmur    Hemochromatosis    hx of   History of BPH    History of COPD    Hyperlipidemia    Hypertension    Hypothyroidism    Incisional hernia    MVP (mitral valve prolapse)    Thyroid  disease     Past Surgical History:  Procedure Laterality Date   COLONOSCOPY  last 09/25/2013   COLONOSCOPY W/ BIOPSIES  2011   Dr Abran   COLOSTOMY TAKEDOWN  2010   Dr Aron   GOLD SEED IMPLANT N/A 11/17/2021   Procedure: GOLD SEED IMPLANT;  Surgeon: Renda Glance, MD;  Location: [facility];  Service: Urology;  Laterality: N/A;  15 MINUTES NEEDED FOR CASE   INGUINAL HERNIA REPAIR Left 1999   Dr. Debby   INSERTION OF MESH N/A 10/13/2013   Procedure: INSERTION OF MESH;  Surgeon: Elspeth Schultze, MD;  Location: [facility] OR;  Service: General;  Laterality: N/A;   LAPAROSCOPIC LYSIS OF ADHESIONS N/A 10/13/2013   Procedure: LAPAROSCOPIC LYSIS OF ADHESIONS;  Surgeon: Elspeth Schultze, MD;  Location: [facility] OR;  Service:  General;  Laterality: N/A;   PARTIAL COLECTOMY  08/12/2008   Hartmann; perforated colon from diverticulitis   SPACE OAR INSTILLATION N/A 11/17/2021   Procedure: SPACE OAR INSTILLATION;  Surgeon: Renda Glance, MD;  Location: [facility];  Service: Urology;  Laterality: N/A;   UPPER GI ENDOSCOPY     VENTRAL HERNIA REPAIR N/A 10/13/2013   Procedure: REPAIR OF VENTRAL WALL HERNIA WITH UNILATERAL COMPARTMENT SEPARATION;  Surgeon: Elspeth Schultze, MD;  Location: [facility] OR;  Service: General;  Laterality: N/A;    Current Outpatient Medications  Medication Sig Dispense Refill   acetaminophen  (TYLENOL ) 500 MG tablet Take 500 mg by mouth every 6 (six) hours as needed for mild pain.     albuterol  (VENTOLIN  HFA) 108 (90 Base) MCG/ACT inhaler Inhale 1-2 puffs into the lungs every 4 (four) hours as needed for wheezing or shortness of breath. 18 g 8   Ascorbic Acid  (VITAMIN C ) 1000 MG tablet Take 1,000 mg by mouth daily.     budesonide-glycopyrrolate -formoterol (BREZTRI  AEROSPHERE) 160-9-4.8 MCG/ACT AERO inhaler Inhale 2 puffs into the lungs in the morning and at bedtime. 10.7 g 11   Calcium  Carbonate-Vitamin D  (CALCIUM  + D PO) Take 1 tablet by mouth daily.      Docusate Sodium  (COLACE PO) Take 200 mg by  mouth in the morning and at bedtime.     fexofenadine (ALLEGRA) 180 MG tablet Take 180 mg by mouth daily.     FIBER PO Take by mouth in the morning and at bedtime.     fluticasone  (FLONASE ) 50 MCG/ACT nasal spray Place 2 sprays into both nostrils daily. 16 g 6   Glucosamine-Chondroit-Vit C-Mn (GLUCOSAMINE 1500 COMPLEX PO) Take by mouth in the morning and at bedtime.     losartan  (COZAAR ) 50 MG tablet Take 1 tablet (50 mg total) by mouth 2 (two) times daily. 180 tablet 1   Multiple Vitamins-Minerals (MULTIVITAMIN PO) Take 1 tablet by mouth daily.      Omega-3 Fatty Acids (FISH OIL PO) Take 1,000 mg by mouth in the morning and at bedtime.     Red Yeast Rice 600 MG CAPS Take 1,200 mg by mouth daily.       SALINE NASAL SPRAY NA Place 1 spray into the nose daily as needed (allergies).      SYNTHROID  137 MCG tablet Take 1 tablet (137 mcg total) by mouth daily before breakfast. 90 tablet 3   vitamin B-12 (CYANOCOBALAMIN) 1000 MCG tablet Take 1,000 mcg by mouth daily.     metoprolol  succinate (TOPROL -XL) 50 MG 24 hr tablet Take 1 tablet (50 mg total) by mouth daily. Take with or immediately following a meal. 90 tablet 3   No current facility-administered medications for this visit.    No Known Allergies  Social History   Socioeconomic History   Marital status: Married    Spouse name: Not on file   Number of children: Not on file   Years of education: Not on file   Highest education level: Bachelor's degree (e.g., BA, AB, BS)  Occupational History   Occupation: Retired    Associate Professor: RETIRED  Tobacco Use   Smoking status: Former    Current packs/day: 0.00    Types: Cigarettes    Quit date: 2005    Years since quitting: 20.9    Passive exposure: Past   Smokeless tobacco: Never   Tobacco comments:    Began smoking at ages between [age]  Vaping Use   Vaping status: Never Used  Substance and Sexual Activity   Alcohol use: Yes    Alcohol/week: 7.0 standard drinks of alcohol    Types: 7 Cans of beer per week    Comment: drinks beer weekly   Drug use: No   Sexual activity: Yes  Other Topics Concern   Not on file  Social History Narrative   Married. Education: [employer].   Social Drivers of Corporate Investment Banker Strain: Low Risk  (07/01/2023)   Overall Financial Resource Strain (CARDIA)    Difficulty of Paying Living Expenses: Not hard at all  Food Insecurity: No Food Insecurity (07/01/2023)   Hunger Vital Sign    Worried About Running Out of Food in the Last Year: Never true    Ran Out of Food in the Last Year: Never true  Transportation Needs: No Transportation Needs (07/01/2023)   PRAPARE - Administrator, Civil Service (Medical): No    Lack of Transportation  (Non-Medical): No  Physical Activity: Insufficiently Active (07/01/2023)   Exercise Vital Sign    Days of Exercise per Week: 1 day    Minutes of Exercise per Session: 20 min  Stress: No Stress Concern Present (07/01/2023)   [employer] of Occupational Health - Occupational Stress Questionnaire    Feeling of Stress : Not at  all  Social Connections: Socially Integrated (07/01/2023)   Social Connection and Isolation Panel    Frequency of Communication with Friends and Family: Three times a week    Frequency of Social Gatherings with Friends and Family: Twice a week    Attends Religious Services: More than 4 times per year    Active Member of [employer] or Organizations: Yes    Attends Engineer, Structural: More than 4 times per year    Marital Status: Married  Catering Manager Violence: Not At Risk (07/01/2023)   Humiliation, Afraid, Rape, and Kick questionnaire    Fear of Current or Ex-Partner: No    Emotionally Abused: No    Physically Abused: No    Sexually Abused: No    Family History  Problem Relation Age of Onset   Breast cancer Mother 42 - 49   Colon cancer Father 34 - 35   Heart disease Father    Diabetes Father    Heart attack Father    Stroke Neg Hx    Esophageal cancer Neg Hx    Rectal cancer Neg Hx    Stomach cancer Neg Hx    Colon polyps Neg Hx     Review of Systems:  As stated in the HPI and otherwise negative.   BP 128/64   Pulse 68   Ht 5' 10 (1.778 m)   Wt 184 lb 6.4 oz (83.6 kg)   SpO2 95%   BMI 26.46 kg/m   Physical Examination: General: Well developed, well nourished, NAD  HEENT: OP clear, mucus membranes moist  SKIN: warm, dry. No rashes. Neuro: No focal deficits  Musculoskeletal: Muscle strength 5/5 all ext  Psychiatric: Mood and affect normal  Neck: No JVD, no carotid bruits, no thyromegaly, no lymphadenopathy.  Lungs:Clear bilaterally, no wheezes, rhonci, crackles Cardiovascular: Regular rate and rhythm. Soft systolic murmur.   Abdomen:Soft. Bowel sounds present. Non-tender.  Extremities: No lower extremity edema.   EKG:  EKG is ordered today. The ekg ordered today demonstrates  EKG Interpretation Date/Time:  Monday December 27 2023 15:55:42 EST Ventricular Rate:  68 PR Interval:  190 QRS Duration:  136 QT Interval:  438 QTC Calculation: 465 R Axis:   75  Text Interpretation: Sinus rhythm with occasional Premature ventricular complexes Non-specific intra-ventricular conduction block Minimal voltage criteria for LVH, may be normal variant ( Cornell product ) Cannot rule out Anterior infarct , age undetermined Confirmed by Verlin Bruckner [phone]) on 12/27/2023 4:09:48 PM    Recent Labs: 07/01/2023: ALT 12; BUN 15; Creatinine, Ser 0.88; Hemoglobin 13.5; Platelets 218.0; Potassium 3.8; Sodium 141 08/19/2023: TSH 1.20   Lipid Panel    Component Value Date/Time   CHOL 129 01/13/2022 1427   CHOL 155 09/18/2016 0826   TRIG 113.0 01/13/2022 1427   HDL 35.90 (L) 01/13/2022 1427   HDL 41 09/18/2016 0826   CHOLHDL 4 01/13/2022 1427   VLDL 22.6 01/13/2022 1427   LDLCALC 71 01/13/2022 1427   LDLCALC 100 (H) 09/18/2016 0826     Wt Readings from Last 3 Encounters:  12/27/23 184 lb 6.4 oz (83.6 kg)  11/22/23 184 lb (83.5 kg)  09/30/23 185 lb 3.2 oz (84 kg)    Assessment and Plan:   1. Chronic LBBB: No dizziness. LV function normal by echo in May 2024.   2. HTN: BP is well controlled. Continue current medications.   3. HLD: Followed in primary care. He is on red yeast rice.    4. Mitral regurgitation: Trivial by  echo in 2024. Soft murmur on exam today  Labs/ tests ordered today include:   Orders Placed This Encounter  Procedures   EKG 12-Lead   Disposition:   F/U with me in one year.    Signed, Lonni Cash, MD, [facility] 12/27/2023 4:35 PM    [facility] [address] [place], [place], [place]  [address] Phone: [phone]; Fax: [phone]

## 2024-01-21 ENCOUNTER — Other Ambulatory Visit: Payer: Self-pay | Admitting: Cardiovascular Disease

## 2024-01-21 DIAGNOSIS — I10 Essential (primary) hypertension: Secondary | ICD-10-CM

## 2024-01-24 ENCOUNTER — Other Ambulatory Visit: Payer: Self-pay | Admitting: Cardiovascular Disease

## 2024-01-24 DIAGNOSIS — I10 Essential (primary) hypertension: Secondary | ICD-10-CM

## 2024-02-06 ENCOUNTER — Encounter: Payer: Self-pay | Admitting: Internal Medicine

## 2024-02-06 NOTE — Patient Instructions (Addendum)
 "     Blood work was ordered.       Medications changes include :   None    A referral was ordered and someone will call you to schedule an appointment.     Return in about 1 year (around 02/06/2025) for Physical Exam.   Health Maintenance, Male Adopting a healthy lifestyle and getting preventive care are important in promoting health and wellness. Ask your health care provider about: The right schedule for you to have regular tests and exams. Things you can do on your own to prevent diseases and keep yourself healthy. What should I know about diet, weight, and exercise? Eat a healthy diet  Eat a diet that includes plenty of vegetables, fruits, low-fat dairy products, and lean protein. Do not eat a lot of foods that are high in solid fats, added sugars, or sodium. Maintain a healthy weight Body mass index (BMI) is a measurement that can be used to identify possible weight problems. It estimates body fat based on height and weight. Your health care provider can help determine your BMI and help you achieve or maintain a healthy weight. Get regular exercise Get regular exercise. This is one of the most important things you can do for your health. Most adults should: Exercise for at least 150 minutes each week. The exercise should increase your heart rate and make you sweat (moderate-intensity exercise). Do strengthening exercises at least twice a week. This is in addition to the moderate-intensity exercise. Spend less time sitting. Even light physical activity can be beneficial. Watch cholesterol and blood lipids Have your blood tested for lipids and cholesterol at 84 years of age, then have this test every 5 years. You may need to have your cholesterol levels checked more often if: Your lipid or cholesterol levels are high. You are older than 84 years of age. You are at high risk for heart disease. What should I know about cancer screening? Many types of cancers can be detected  early and may often be prevented. Depending on your health history and family history, you may need to have cancer screening at various ages. This may include screening for: Colorectal cancer. Prostate cancer. Skin cancer. Lung cancer. What should I know about heart disease, diabetes, and high blood pressure? Blood pressure and heart disease High blood pressure causes heart disease and increases the risk of stroke. This is more likely to develop in people who have high blood pressure readings or are overweight. Talk with your health care provider about your target blood pressure readings. Have your blood pressure checked: Every 3-5 years if you are 57-50 years of age. Every year if you are 61 years old or older. If you are between the ages of 21 and 16 and are a current or former smoker, ask your health care provider if you should have a one-time screening for abdominal aortic aneurysm (AAA). Diabetes Have regular diabetes screenings. This checks your fasting blood sugar level. Have the screening done: Once every three years after age 65 if you are at a normal weight and have a low risk for diabetes. More often and at a younger age if you are overweight or have a high risk for diabetes. What should I know about preventing infection? Hepatitis B If you have a higher risk for hepatitis B, you should be screened for this virus. Talk with your health care provider to find out if you are at risk for hepatitis B infection. Hepatitis C Blood testing is recommended  for: Everyone born from [birth year] through 1965. Anyone with known risk factors for hepatitis C. Sexually transmitted infections (STIs) You should be screened each year for STIs, including gonorrhea and chlamydia, if: You are sexually active and are younger than 84 years of age. You are older than 84 years of age and your health care provider tells you that you are at risk for this type of infection. Your sexual activity has changed since  you were last screened, and you are at increased risk for chlamydia or gonorrhea. Ask your health care provider if you are at risk. Ask your health care provider about whether you are at high risk for HIV. Your health care provider may recommend a prescription medicine to help prevent HIV infection. If you choose to take medicine to prevent HIV, you should first get tested for HIV. You should then be tested every 3 months for as long as you are taking the medicine. Follow these instructions at home: Alcohol use Do not drink alcohol if your health care provider tells you not to drink. If you drink alcohol: Limit how much you have to 0-2 drinks a day. Know how much alcohol is in your drink. In the [place] one drink equals one 12 oz bottle of beer (355 mL), one 5 oz glass of wine (148 mL), or one 1 oz glass of hard liquor (44 mL). Lifestyle Do not use any products that contain nicotine or tobacco. These products include cigarettes, chewing tobacco, and vaping devices, such as e-cigarettes. If you need help quitting, ask your health care provider. Do not use street drugs. Do not share needles. Ask your health care provider for help if you need support or information about quitting drugs. General instructions Schedule regular health, dental, and eye exams. Stay current with your vaccines. Tell your health care provider if: You often feel depressed. You have ever been abused or do not feel safe at home. Summary Adopting a healthy lifestyle and getting preventive care are important in promoting health and wellness. Follow your health care provider's instructions about healthy diet, exercising, and getting tested or screened for diseases. Follow your health care provider's instructions on monitoring your cholesterol and blood pressure. This information is not intended to replace advice given to you by your health care provider. Make sure you discuss any questions you have with your health care  provider. Document Revised: 06/03/2020 Document Reviewed: 06/03/2020 Elsevier Patient Education  2024 [employer]. "

## 2024-02-06 NOTE — Progress Notes (Unsigned)
 "   Subjective:    Patient ID: Preston Reeves, male    DOB: [birth year], 84 y.o.   MRN: [id number]     HPI Preston Reeves is here for a physical exam and his chronic medical problems.   Overall doing well.  Has no concerns.   Medications and allergies reviewed with patient and updated if appropriate.  Medications Ordered Prior to Encounter[1]  Review of Systems  Constitutional:  Negative for fever.  Eyes:  Negative for visual disturbance.  Respiratory:  Positive for shortness of breath (chronic-). Negative for cough and wheezing.   Cardiovascular:  Negative for chest pain, palpitations and leg swelling.  Gastrointestinal:  Negative for abdominal pain, blood in stool, constipation and diarrhea.       No gerd  Genitourinary:  Negative for difficulty urinating, dysuria and hematuria.  Musculoskeletal:  Negative for arthralgias and back pain.  Skin:  Negative for rash.  Neurological:  Negative for dizziness, light-headedness, numbness and headaches.  Psychiatric/Behavioral:  Negative for dysphoric mood and sleep disturbance. The patient is not nervous/anxious.        Objective:   Vitals:   02/07/24 0954  BP: 118/70  Pulse: 81  Temp: 98.1 F (36.7 C)  SpO2: 98%   Filed Weights   02/07/24 0954  Weight: 184 lb (83.5 kg)   Body mass index is 26.4 kg/m.  BP Readings from Last 3 Encounters:  02/07/24 118/70  12/27/23 128/64  11/22/23 (!) 130/58    Wt Readings from Last 3 Encounters:  02/07/24 184 lb (83.5 kg)  12/27/23 184 lb 6.4 oz (83.6 kg)  11/22/23 184 lb (83.5 kg)      Physical Exam Constitutional: He appears well-developed and well-nourished. No distress.  HENT:  Head: Normocephalic and atraumatic.  Right Ear: External ear normal.  Left Ear: External ear normal.  Normal ear canals and TM b/l  Mouth/Throat: Oropharynx is clear and moist. Eyes: Conjunctivae and EOM are normal.  Neck: Neck supple. No tracheal deviation present. No thyromegaly present.  No  carotid bruit  Cardiovascular: Normal rate, regular rhythm, normal heart sounds and intact distal pulses.   2/6 sys murmur heard.  No lower extremity edema. Pulmonary/Chest: Effort normal and breath sounds normal. No respiratory distress. He has no wheezes. He has no rales.  Abdominal: Soft.  Large abdominal hernia on the left side-incisional in nature-nontender.  He exhibits no distension. There is no tenderness.  Genitourinary: deferred  Lymphadenopathy:   He has no cervical adenopathy.  Skin: Skin is warm and dry. He is not diaphoretic.  Psychiatric: He has a normal mood and affect. His behavior is normal.         Assessment & Plan:   Physical exam: Screening blood work  ordered Exercise   not much Weight  is good Substance abuse   none   Reviewed recommended immunizations.   Health Maintenance  Topic Date Due   COVID-19 Vaccine (3 - Pfizer risk series) 02/22/2024 (Originally 06/14/2019)   Influenza Vaccine  04/25/2024 (Originally 08/27/2023)   Zoster Vaccines- Shingrix (1 of 2) 05/07/2024 (Originally 02/27/1959)   DTaP/Tdap/Td (2 - Tdap) 02/06/2025 (Originally 06/21/2023)   Medicare Annual Wellness (AWV)  06/30/2024   Pneumococcal Vaccine: 50+ Years  Completed   Meningococcal B Vaccine  Aged Out   Colonoscopy  Discontinued     See Problem List for Assessment and Plan of chronic medical problems.      [1]  Current Outpatient Medications on File Prior to Visit  Medication Sig Dispense  Refill   acetaminophen  (TYLENOL ) 500 MG tablet Take 500 mg by mouth every 6 (six) hours as needed for mild pain.     albuterol  (VENTOLIN  HFA) 108 (90 Base) MCG/ACT inhaler Inhale 1-2 puffs into the lungs every 4 (four) hours as needed for wheezing or shortness of breath. 18 g 8   Ascorbic Acid  (VITAMIN C ) 1000 MG tablet Take 1,000 mg by mouth daily.     budesonide-glycopyrrolate -formoterol (BREZTRI  AEROSPHERE) 160-9-4.8 MCG/ACT AERO inhaler Inhale 2 puffs into the lungs in the morning and  at bedtime. 10.7 g 11   Calcium  Carbonate-Vitamin D  (CALCIUM  + D PO) Take 1 tablet by mouth daily.      Docusate Sodium  (COLACE PO) Take 200 mg by mouth in the morning and at bedtime.     fexofenadine (ALLEGRA) 180 MG tablet Take 180 mg by mouth daily.     FIBER PO Take by mouth in the morning and at bedtime.     fluticasone  (FLONASE ) 50 MCG/ACT nasal spray Place 2 sprays into both nostrils daily. 16 g 6   Glucosamine-Chondroit-Vit C-Mn (GLUCOSAMINE 1500 COMPLEX PO) Take by mouth in the morning and at bedtime.     losartan  (COZAAR ) 50 MG tablet Take 1 tablet (50 mg total) by mouth 2 (two) times daily. 180 tablet 3   metoprolol  succinate (TOPROL -XL) 50 MG 24 hr tablet Take 1 tablet (50 mg total) by mouth daily. Take with or immediately following a meal. 90 tablet 3   Multiple Vitamins-Minerals (MULTIVITAMIN PO) Take 1 tablet by mouth daily.      Omega-3 Fatty Acids (FISH OIL PO) Take 1,000 mg by mouth in the morning and at bedtime.     Red Yeast Rice 600 MG CAPS Take 1,200 mg by mouth daily.      SALINE NASAL SPRAY NA Place 1 spray into the nose daily as needed (allergies).      SYNTHROID  137 MCG tablet Take 1 tablet (137 mcg total) by mouth daily before breakfast. 90 tablet 3   vitamin B-12 (CYANOCOBALAMIN) 1000 MCG tablet Take 1,000 mcg by mouth daily.     No current facility-administered medications on file prior to visit.   "

## 2024-02-07 ENCOUNTER — Ambulatory Visit: Admitting: Internal Medicine

## 2024-02-07 VITALS — BP 118/70 | HR 81 | Temp 98.1°F | Ht 70.0 in | Wt 184.0 lb

## 2024-02-07 DIAGNOSIS — E039 Hypothyroidism, unspecified: Secondary | ICD-10-CM | POA: Diagnosis not present

## 2024-02-07 DIAGNOSIS — I10 Essential (primary) hypertension: Secondary | ICD-10-CM

## 2024-02-07 DIAGNOSIS — Z0000 Encounter for general adult medical examination without abnormal findings: Secondary | ICD-10-CM

## 2024-02-07 DIAGNOSIS — Z8546 Personal history of malignant neoplasm of prostate: Secondary | ICD-10-CM

## 2024-02-07 DIAGNOSIS — R7303 Prediabetes: Secondary | ICD-10-CM | POA: Diagnosis not present

## 2024-02-07 LAB — COMPREHENSIVE METABOLIC PANEL WITH GFR
ALT: 14 U/L (ref 3–53)
AST: 21 U/L (ref 5–37)
Albumin: 4 g/dL (ref 3.5–5.2)
Alkaline Phosphatase: 68 U/L (ref 39–117)
BUN: 15 mg/dL (ref 6–23)
CO2: 29 meq/L (ref 19–32)
Calcium: 9.4 mg/dL (ref 8.4–10.5)
Chloride: 105 meq/L (ref 96–112)
Creatinine, Ser: 0.89 mg/dL (ref 0.40–1.50)
GFR: 79.01 mL/min
Glucose, Bld: 81 mg/dL (ref 70–99)
Potassium: 3.9 meq/L (ref 3.5–5.1)
Sodium: 139 meq/L (ref 135–145)
Total Bilirubin: 0.7 mg/dL (ref 0.2–1.2)
Total Protein: 7.8 g/dL (ref 6.0–8.3)

## 2024-02-07 LAB — LIPID PANEL
Cholesterol: 164 mg/dL (ref 28–200)
HDL: 52.6 mg/dL
LDL Cholesterol: 96 mg/dL (ref 10–99)
NonHDL: 111.16
Total CHOL/HDL Ratio: 3
Triglycerides: 75 mg/dL (ref 10.0–149.0)
VLDL: 15 mg/dL (ref 0.0–40.0)

## 2024-02-07 LAB — HEMOGLOBIN A1C: Hgb A1c MFr Bld: 5.6 % (ref 4.6–6.5)

## 2024-02-07 LAB — CBC
HCT: 39.6 % (ref 39.0–52.0)
Hemoglobin: 14 g/dL (ref 13.0–17.0)
MCHC: 35.3 g/dL (ref 30.0–36.0)
MCV: 103.9 fl — ABNORMAL HIGH (ref 78.0–100.0)
Platelets: 198 K/uL (ref 150.0–400.0)
RBC: 3.82 Mil/uL — ABNORMAL LOW (ref 4.22–5.81)
RDW: 13 % (ref 11.5–15.5)
WBC: 4.3 K/uL (ref 4.0–10.5)

## 2024-02-07 LAB — FERRITIN: Ferritin: 42.5 ng/mL (ref 22.0–322.0)

## 2024-02-07 LAB — IBC PANEL
Iron: 204 ug/dL — ABNORMAL HIGH (ref 42–165)
Saturation Ratios: 81.9 % — ABNORMAL HIGH (ref 20.0–50.0)
TIBC: 249.2 ug/dL — ABNORMAL LOW (ref 250.0–450.0)
Transferrin: 178 mg/dL — ABNORMAL LOW (ref 212.0–360.0)

## 2024-02-07 LAB — TSH: TSH: 1.37 u[IU]/mL (ref 0.35–5.50)

## 2024-02-07 NOTE — Assessment & Plan Note (Signed)
 Chronic Follows with hematology Has not seen them in over a year Will check CBC, iron  panel

## 2024-02-07 NOTE — Assessment & Plan Note (Signed)
 Chronic BP well controlled Continue metoprolol xl 50 mg daily, losartan 50 mg bid Cmp, cbc

## 2024-02-07 NOTE — Assessment & Plan Note (Signed)
 S/p external beam radiation  Follows with urology-Dr. Renda No evidence of recurrence

## 2024-02-07 NOTE — Assessment & Plan Note (Signed)
 Chronic Lab Results  Component Value Date   HGBA1C 5.6 07/01/2023   Check a1c Low sugar/carbohydrate diet Encouraged regular exercise

## 2024-02-07 NOTE — Assessment & Plan Note (Signed)
 Chronic  Thyroid  Check tsh and will titrate med dose if needed Currently taking Synthroid  137 mcg daily

## 2024-02-10 ENCOUNTER — Ambulatory Visit: Payer: Self-pay | Admitting: Internal Medicine

## 2024-07-03 ENCOUNTER — Ambulatory Visit
